# Patient Record
Sex: Male | Born: 1940 | Race: White | Hispanic: No | Marital: Married | Smoking: Never smoker
Health system: Southern US, Community
[De-identification: ages and names within clinical notes are randomized; demographics above are authoritative.]

## PROBLEM LIST (undated history)

## (undated) DIAGNOSIS — C3490 Malignant neoplasm of unspecified part of unspecified bronchus or lung: Secondary | ICD-10-CM

## (undated) HISTORY — PX: KNEE SURGERY: SHX244

## (undated) HISTORY — PX: OTHER SURGICAL HISTORY: SHX169

## (undated) HISTORY — PX: TONSILLECTOMY AND ADENOIDECTOMY: SHX28

---

## 1998-09-26 DIAGNOSIS — R439 Unspecified disturbances of smell and taste: Secondary | ICD-10-CM | POA: Insufficient documentation

## 2000-03-04 DIAGNOSIS — M25579 Pain in unspecified ankle and joints of unspecified foot: Secondary | ICD-10-CM | POA: Insufficient documentation

## 2005-07-05 DIAGNOSIS — K5732 Diverticulitis of large intestine without perforation or abscess without bleeding: Secondary | ICD-10-CM | POA: Insufficient documentation

## 2006-02-04 DIAGNOSIS — F32A Depression, unspecified: Secondary | ICD-10-CM | POA: Insufficient documentation

## 2006-02-28 DIAGNOSIS — G4700 Insomnia, unspecified: Secondary | ICD-10-CM | POA: Insufficient documentation

## 2008-02-27 DIAGNOSIS — E559 Vitamin D deficiency, unspecified: Secondary | ICD-10-CM

## 2008-02-27 HISTORY — DX: Vitamin D deficiency, unspecified: E55.9

## 2010-01-29 DIAGNOSIS — Z9889 Other specified postprocedural states: Secondary | ICD-10-CM | POA: Insufficient documentation

## 2010-01-29 DIAGNOSIS — Z125 Encounter for screening for malignant neoplasm of prostate: Secondary | ICD-10-CM | POA: Insufficient documentation

## 2011-03-05 DIAGNOSIS — D039 Melanoma in situ, unspecified: Secondary | ICD-10-CM

## 2011-03-05 HISTORY — DX: Melanoma in situ, unspecified: D03.9

## 2013-01-03 DIAGNOSIS — F419 Anxiety disorder, unspecified: Secondary | ICD-10-CM | POA: Insufficient documentation

## 2013-11-27 DIAGNOSIS — K2100 Gastro-esophageal reflux disease with esophagitis, without bleeding: Secondary | ICD-10-CM | POA: Insufficient documentation

## 2013-11-27 DIAGNOSIS — D126 Benign neoplasm of colon, unspecified: Secondary | ICD-10-CM | POA: Insufficient documentation

## 2013-11-27 DIAGNOSIS — K449 Diaphragmatic hernia without obstruction or gangrene: Secondary | ICD-10-CM | POA: Insufficient documentation

## 2013-11-27 DIAGNOSIS — K648 Other hemorrhoids: Secondary | ICD-10-CM | POA: Insufficient documentation

## 2013-11-28 DIAGNOSIS — K2270 Barrett's esophagus without dysplasia: Secondary | ICD-10-CM | POA: Insufficient documentation

## 2013-11-28 HISTORY — DX: Barrett's esophagus without dysplasia: K22.70

## 2014-03-28 DIAGNOSIS — N200 Calculus of kidney: Secondary | ICD-10-CM | POA: Insufficient documentation

## 2014-12-30 DIAGNOSIS — K5730 Diverticulosis of large intestine without perforation or abscess without bleeding: Secondary | ICD-10-CM | POA: Insufficient documentation

## 2017-04-18 DIAGNOSIS — R739 Hyperglycemia, unspecified: Secondary | ICD-10-CM | POA: Insufficient documentation

## 2018-01-19 DIAGNOSIS — M5416 Radiculopathy, lumbar region: Secondary | ICD-10-CM | POA: Insufficient documentation

## 2019-08-17 DIAGNOSIS — N182 Chronic kidney disease, stage 2 (mild): Secondary | ICD-10-CM | POA: Insufficient documentation

## 2019-08-21 ENCOUNTER — Other Ambulatory Visit: Payer: Self-pay

## 2019-08-21 ENCOUNTER — Ambulatory Visit (INDEPENDENT_AMBULATORY_CARE_PROVIDER_SITE_OTHER): Payer: Medicare Other | Admitting: Physician Assistant

## 2019-08-21 ENCOUNTER — Encounter: Payer: Self-pay | Admitting: Physician Assistant

## 2019-08-21 DIAGNOSIS — L570 Actinic keratosis: Secondary | ICD-10-CM | POA: Diagnosis not present

## 2019-08-21 DIAGNOSIS — L814 Other melanin hyperpigmentation: Secondary | ICD-10-CM | POA: Diagnosis not present

## 2019-08-21 DIAGNOSIS — D1800 Hemangioma unspecified site: Secondary | ICD-10-CM

## 2019-08-21 DIAGNOSIS — Z86018 Personal history of other benign neoplasm: Secondary | ICD-10-CM

## 2019-08-21 DIAGNOSIS — L821 Other seborrheic keratosis: Secondary | ICD-10-CM | POA: Diagnosis not present

## 2019-08-21 DIAGNOSIS — Z1283 Encounter for screening for malignant neoplasm of skin: Secondary | ICD-10-CM | POA: Diagnosis not present

## 2019-08-21 DIAGNOSIS — L578 Other skin changes due to chronic exposure to nonionizing radiation: Secondary | ICD-10-CM

## 2019-08-21 MED ORDER — TOLAK 4 % EX CREA: 1.0000 "application " | TOPICAL_CREAM | Freq: Every day | CUTANEOUS | 1 refills | Status: DC

## 2019-08-21 NOTE — Progress Notes (Signed)
   Follow-Up Visit   Subjective  Peter Brown is a 79 y.o. male who presents for the following: Follow-up (1 year skin check).   The following portions of the chart were reviewed this encounter and updated as appropriate: Tobacco  Allergies  Meds  Problems  Med Hx  Surg Hx  Fam Hx      Objective  Well appearing patient in no apparent distress; mood and affect are within normal limits.  A full examination was performed including scalp, head, eyes, ears, nose, lips, neck, chest, axillae, abdomen, back, buttocks, bilateral upper extremities, bilateral lower extremities, hands, feet, fingers, toes, fingernails, and toenails. All findings within normal limits unless otherwise noted below.  Objective  Left Buccal Cheek  (4), Right Buccal Cheek  (6), Right Parietal Scalp (5), Right Temporal Scalp: Erythematous patches with gritty scale.  Objective  Left Abdomen (side) - Upper: Scar clear   Assessment & Plan  AK (actinic keratosis) (16) Right Parietal Scalp (5); Left Buccal Cheek  (4); Right Buccal Cheek  (6); Right Temporal Scalp  Destruction of lesion - Left Buccal Cheek , Right Buccal Cheek , Right Parietal Scalp, Right Temporal Scalp Complexity: simple   Destruction method: cryotherapy   Informed consent: discussed and consent obtained   Timeout:  patient name, date of birth, surgical site, and procedure verified Lesion destroyed using liquid nitrogen: Yes   Outcome: patient tolerated procedure well with no complications    Ordered Medications: Fluorouracil (TOLAK) 4 % CREA  Screening for malignant neoplasm of skin Mid Back  Full body skin exam  History of dysplastic nevus Left Abdomen (side) - Upper  observe   Lentigines - Scattered tan macules - Discussed due to sun exposure - Benign, observe - Call for any changes  Seborrheic Keratoses - Stuck-on, waxy, tan-brown papules and plaques  - Discussed benign etiology and prognosis. - Observe - Call for any  changes   Hemangiomas - Red papules - Discussed benign nature - Observe - Call for any changes  Actinic Damage - diffuse scaly erythematous macules with underlying dyspigmentation - Recommend daily broad spectrum sunscreen SPF 30+ to sun-exposed areas, reapply every 2 hours as needed.  - Call for new or changing lesions.  Skin cancer screening performed today.  I, Syleena Mchan, PA-C, have reviewed all documentation's for this visit.  The documentation on 08/21/19 for the exam, diagnosis, procedures and orders are all accurate and complete.

## 2020-07-02 DIAGNOSIS — R918 Other nonspecific abnormal finding of lung field: Secondary | ICD-10-CM | POA: Insufficient documentation

## 2020-07-02 DIAGNOSIS — Z77090 Contact with and (suspected) exposure to asbestos: Secondary | ICD-10-CM | POA: Insufficient documentation

## 2020-07-09 ENCOUNTER — Encounter: Payer: Self-pay | Admitting: *Deleted

## 2020-07-09 DIAGNOSIS — R918 Other nonspecific abnormal finding of lung field: Secondary | ICD-10-CM

## 2020-07-09 NOTE — Progress Notes (Signed)
I contacted [facility] to obtain ct scan from them for coordination of care.  They will make a CD and send to me at the cancer center.

## 2020-07-09 NOTE — Progress Notes (Signed)
I received referral on Mr. Peter Brown.  Due to him living in [place], I updated new patient coordinator to see if patient would like to be seen at [facility]  He would like to be seen here.  I updated her to call and schedule him to be seen with Dr. Julien Nordmann on 07/14/20.

## 2020-07-09 NOTE — Progress Notes (Signed)
I received another call that patient does not have scans from referring facility.  I was updated to call [facility]. I did. They are unable to push scans to pacs.  They need patient release of information before they can send a scan. We have not seen patient so this will have to wait.

## 2020-07-10 ENCOUNTER — Telehealth: Payer: Self-pay | Admitting: Internal Medicine

## 2020-07-10 NOTE — Telephone Encounter (Signed)
Received a new patient referral from Dr. Brynda Greathouse for mass of upper lobe of left lung. Mr. Peter Brown has been cld and scheduled to see Dr. Julien Nordmann on 7/11 at 1:45pm w/labs at 2:15pm. Pt aware to arrive 15 minutes early.

## 2020-07-14 ENCOUNTER — Inpatient Hospital Stay: Payer: Medicare HMO | Attending: Internal Medicine

## 2020-07-14 ENCOUNTER — Inpatient Hospital Stay (HOSPITAL_BASED_OUTPATIENT_CLINIC_OR_DEPARTMENT_OTHER): Payer: Medicare HMO | Admitting: Internal Medicine

## 2020-07-14 ENCOUNTER — Other Ambulatory Visit: Payer: Self-pay

## 2020-07-14 VITALS — BP 128/79 | HR 88 | Temp 97.8°F | Resp 20 | Ht 73.0 in | Wt 179.7 lb

## 2020-07-14 DIAGNOSIS — R918 Other nonspecific abnormal finding of lung field: Secondary | ICD-10-CM | POA: Insufficient documentation

## 2020-07-14 DIAGNOSIS — Z79899 Other long term (current) drug therapy: Secondary | ICD-10-CM | POA: Diagnosis not present

## 2020-07-14 DIAGNOSIS — Z77090 Contact with and (suspected) exposure to asbestos: Secondary | ICD-10-CM | POA: Insufficient documentation

## 2020-07-14 DIAGNOSIS — J90 Pleural effusion, not elsewhere classified: Secondary | ICD-10-CM | POA: Insufficient documentation

## 2020-07-14 LAB — CBC WITH DIFFERENTIAL (CANCER CENTER ONLY)
Abs Immature Granulocytes: 0.02 10*3/uL (ref 0.00–0.07)
Basophils Absolute: 0 10*3/uL (ref 0.0–0.1)
Basophils Relative: 1 %
Eosinophils Absolute: 0.6 10*3/uL — ABNORMAL HIGH (ref 0.0–0.5)
Eosinophils Relative: 7 %
HCT: 39.3 % (ref 39.0–52.0)
Hemoglobin: 13.5 g/dL (ref 13.0–17.0)
Immature Granulocytes: 0 %
Lymphocytes Relative: 19 %
Lymphs Abs: 1.6 10*3/uL (ref 0.7–4.0)
MCH: 31.1 pg (ref 26.0–34.0)
MCHC: 34.4 g/dL (ref 30.0–36.0)
MCV: 90.6 fL (ref 80.0–100.0)
Monocytes Absolute: 0.8 10*3/uL (ref 0.1–1.0)
Monocytes Relative: 10 %
Neutro Abs: 5.5 10*3/uL (ref 1.7–7.7)
Neutrophils Relative %: 63 %
Platelet Count: 198 10*3/uL (ref 150–400)
RBC: 4.34 MIL/uL (ref 4.22–5.81)
RDW: 12.3 % (ref 11.5–15.5)
WBC Count: 8.5 10*3/uL (ref 4.0–10.5)
nRBC: 0 % (ref 0.0–0.2)

## 2020-07-14 LAB — CMP (CANCER CENTER ONLY)
ALT: 12 U/L (ref 0–44)
AST: 17 U/L (ref 15–41)
Albumin: 3.7 g/dL (ref 3.5–5.0)
Alkaline Phosphatase: 88 U/L (ref 38–126)
Anion gap: 8 (ref 5–15)
BUN: 16 mg/dL (ref 8–23)
CO2: 30 mmol/L (ref 22–32)
Calcium: 9.6 mg/dL (ref 8.9–10.3)
Chloride: 104 mmol/L (ref 98–111)
Creatinine: 1.29 mg/dL — ABNORMAL HIGH (ref 0.61–1.24)
GFR, Estimated: 57 mL/min — ABNORMAL LOW (ref >60)
Glucose, Bld: 150 mg/dL — ABNORMAL HIGH (ref 70–99)
Potassium: 4.5 mmol/L (ref 3.5–5.1)
Sodium: 142 mmol/L (ref 135–145)
Total Bilirubin: 0.4 mg/dL (ref 0.3–1.2)
Total Protein: 6.7 g/dL (ref 6.5–8.1)

## 2020-07-14 NOTE — Progress Notes (Signed)
[facility] [phone]   [phone]  CONSULT NOTE  REFERRING PHYSICIAN: Dr. [name]  REASON FOR CONSULTATION:  80 years old white male with suspicious lung nodules  HPI Peter Brown is a 80 y.o. male with past medical history significant for multiple medical problems including history of dyslipidemia, depression, diverticulosis, kidney stone, psoriasis, knee surgery as well as history of asbestos exposure.  The patient had pain on the left side of the chest as well as back.  He initially thought that he pulled a muscle but his pain was getting worse and he was seen by his primary care physician and received a steroid injection twice with some pain medication with no improvement in his symptoms. He had chest x-ray performed for further evaluation of his condition and that showed a suspicious nodule in the left lung.  This was followed by CT scan of the chest on 07/01/2020 and it showed pleural-based left lung nodule measuring 1.3 x 1.5 x 1.9 cm in addition to scattered small bilateral nodules as well as moderate left pleural effusion.  The patient was referred to me today for evaluation and recommendation regarding his condition. When seen today he continues to have pain on the left side of the chest with referral to [facility] and he takes Tylenol on as-needed basis.  He denied having any shortness of breath but has occasional cough with no hemoptysis.  He no significant weight loss or night sweats.  He has no nausea, vomiting, diarrhea or constipation but he takes Prilosec for acid reflux.  He has no headache or visual changes. Family history significant for mother with arthritis and dementia.  Father had prostate cancer and brother had head and neck cancer. The patient is married and he was accompanied today by his wife Peter Brown.  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  He has no history of  smoking, alcohol or drug abuse.  HPI  No past medical history on file.  Past Surgical History:  Procedure Laterality Date   KNEE SURGERY     orthoscopic     TONSILLECTOMY AND ADENOIDECTOMY      No family history on file.  Social History Social History   Tobacco Use   Smoking status: Never   Smokeless tobacco: Never  Substance Use Topics   Alcohol use: Never   Drug use: Never    Allergies  Allergen Reactions   Venlafaxine     Multiple - urin retention, constipation    Current Outpatient Medications  Medication Sig Dispense Refill   aspirin 81 MG EC tablet Take by mouth.     coal tar-salicylic acid 2 % shampoo Apply topically daily as needed for itching.     DULoxetine (CYMBALTA) 30 MG capsule Take 30 mg by mouth daily.     fluocinonide cream (LIDEX) 9.32 % Apply 1 application topically 2 (two) times daily.     Fluorouracil (TOLAK) 4 % CREA Apply 1 application topically at bedtime. qhs x 2 weeks 40 g 1   ketoconazole (NIZORAL) 2 % cream Apply 1 application topically daily.     Krill Oil (OMEGA-3) 500 MG CAPS Take by mouth.     loratadine (CLARITIN) 10 MG tablet Take by mouth.     No current facility-administered medications for this visit.    Review of Systems  Constitutional: positive for fatigue Eyes: negative Ears, nose, mouth, throat, and face: negative Respiratory: positive  for pleurisy/chest pain Cardiovascular: negative Gastrointestinal: negative Genitourinary:negative Integument/breast: negative Hematologic/lymphatic: negative Musculoskeletal:positive for back pain Neurological: negative Behavioral/Psych: negative Endocrine: negative Allergic/Immunologic: negative  Physical Exam  GTX:MIWOE, healthy, no distress, well nourished, and well developed SKIN: skin color, texture, turgor are normal, no rashes or significant lesions HEAD: Normocephalic, No masses, lesions, tenderness or abnormalities EYES: normal, PERRLA, Conjunctiva are pink and  non-injected EARS: External ears normal OROPHARYNX:no exudate, no erythema, and lips, buccal mucosa, and tongue normal  NECK: supple, no adenopathy, no JVD LYMPH:  no palpable lymphadenopathy, no hepatosplenomegaly LUNGS: decreased breath sounds in the lower half of the left lung. HEART: regular rate & rhythm, no murmurs, and no gallops ABDOMEN:abdomen soft, non-tender, normal bowel sounds, and no masses or organomegaly BACK: Back symmetric, no curvature., No CVA tenderness EXTREMITIES:no joint deformities, effusion, or inflammation, no edema  NEURO: alert & oriented x 3 with fluent speech, no focal motor/sensory deficits  PERFORMANCE STATUS: ECOG 1  LABORATORY DATA: Lab Results  Component Value Date   WBC 8.5 07/14/2020   HGB 13.5 07/14/2020   HCT 39.3 07/14/2020   MCV 90.6 07/14/2020   PLT 198 07/14/2020      Chemistry   No results found for: NA, K, CL, CO2, BUN, CREATININE, GLU No results found for: CALCIUM, ALKPHOS, AST, ALT, BILITOT     RADIOGRAPHIC STUDIES: No results found.  ASSESSMENT: This is a very pleasant 80 years old white male with suspicious left pleural-based nodule in addition to scattered bilateral pulmonary nodules and moderate left pleural effusion concerning for pulmonary neoplasm or mesothelioma with his previous history of asbestos exposure.   PLAN: I had a lengthy discussion with the patient and his wife today about his current disease condition and further investigation to confirm his diagnosis. I explained to the patient that this unlikely to be mesothelioma but other primary parenchymal pulmonary neoplasm could not be excluded. I will arrange for the patient to have a PET scan performed for further evaluation of his disease and to choose the best site for tissue diagnosis. I will also refer the patient to interventional radiology for consideration of ultrasound-guided left thoracentesis and will send the fluid for cytologic evaluation. I will see the  patient back for follow-up visit in around 2 weeks for evaluation and more discussion of his treatment options based on the PET scan and the fluid cytology. The patient was advised to call immediately if he has any concerning symptoms in the interval. The patient voices understanding of current disease status and treatment options and is in agreement with the current care plan.  All questions were answered. The patient knows to call the clinic with any problems, questions or concerns. We can certainly see the patient much sooner if necessary.  Thank you so much for allowing me to participate in the care of Peter Brown. I will continue to follow up the patient with you and assist in his care.  The total time spent in the appointment was 60 minutes.  Disclaimer: This note was dictated with voice recognition software. Similar sounding words can inadvertently be transcribed and may not be corrected upon review.   Eilleen Kempf July 14, 2020, 1:37 PM

## 2020-07-16 ENCOUNTER — Encounter: Payer: Self-pay | Admitting: *Deleted

## 2020-07-16 NOTE — Progress Notes (Signed)
I followed up on Mr. Peter Brown schedule. He is set up for his PET and bx will be completed on 7/29.  I completed scheduling message to set up patient for a follow up on 8/1.

## 2020-07-18 ENCOUNTER — Encounter: Payer: Self-pay | Admitting: *Deleted

## 2020-07-18 NOTE — Progress Notes (Signed)
I followed up on Peter Brown plan of care. Per Dr. Worthy Flank last note patient is set up for his thoracentesis and PET scan.

## 2020-07-21 ENCOUNTER — Telehealth: Payer: Self-pay | Admitting: Internal Medicine

## 2020-07-21 ENCOUNTER — Other Ambulatory Visit: Payer: Self-pay

## 2020-07-21 ENCOUNTER — Ambulatory Visit (HOSPITAL_COMMUNITY)
Admission: RE | Admit: 2020-07-21 | Discharge: 2020-07-21 | Disposition: A | Discharge status: Home / Self Care | Payer: Medicare HMO | Source: Ambulatory Visit | Attending: Radiology | Admitting: Radiology

## 2020-07-21 ENCOUNTER — Encounter: Payer: Self-pay | Admitting: *Deleted

## 2020-07-21 ENCOUNTER — Ambulatory Visit (HOSPITAL_COMMUNITY)
Admission: RE | Admit: 2020-07-21 | Discharge: 2020-07-21 | Disposition: A | Discharge status: Home / Self Care | Payer: Medicare HMO | Source: Ambulatory Visit | Attending: Internal Medicine | Admitting: Internal Medicine

## 2020-07-21 DIAGNOSIS — Z77090 Contact with and (suspected) exposure to asbestos: Secondary | ICD-10-CM | POA: Insufficient documentation

## 2020-07-21 DIAGNOSIS — R918 Other nonspecific abnormal finding of lung field: Secondary | ICD-10-CM | POA: Diagnosis not present

## 2020-07-21 DIAGNOSIS — J90 Pleural effusion, not elsewhere classified: Secondary | ICD-10-CM

## 2020-07-21 IMAGING — CR DG CHEST 1V
1 series · 1 of 1 positions shown · non-contrast
Comparison: None.

CLINICAL DATA: Status post left thoracentesis

EXAM:
CHEST  1 VIEW

[w chest pa]
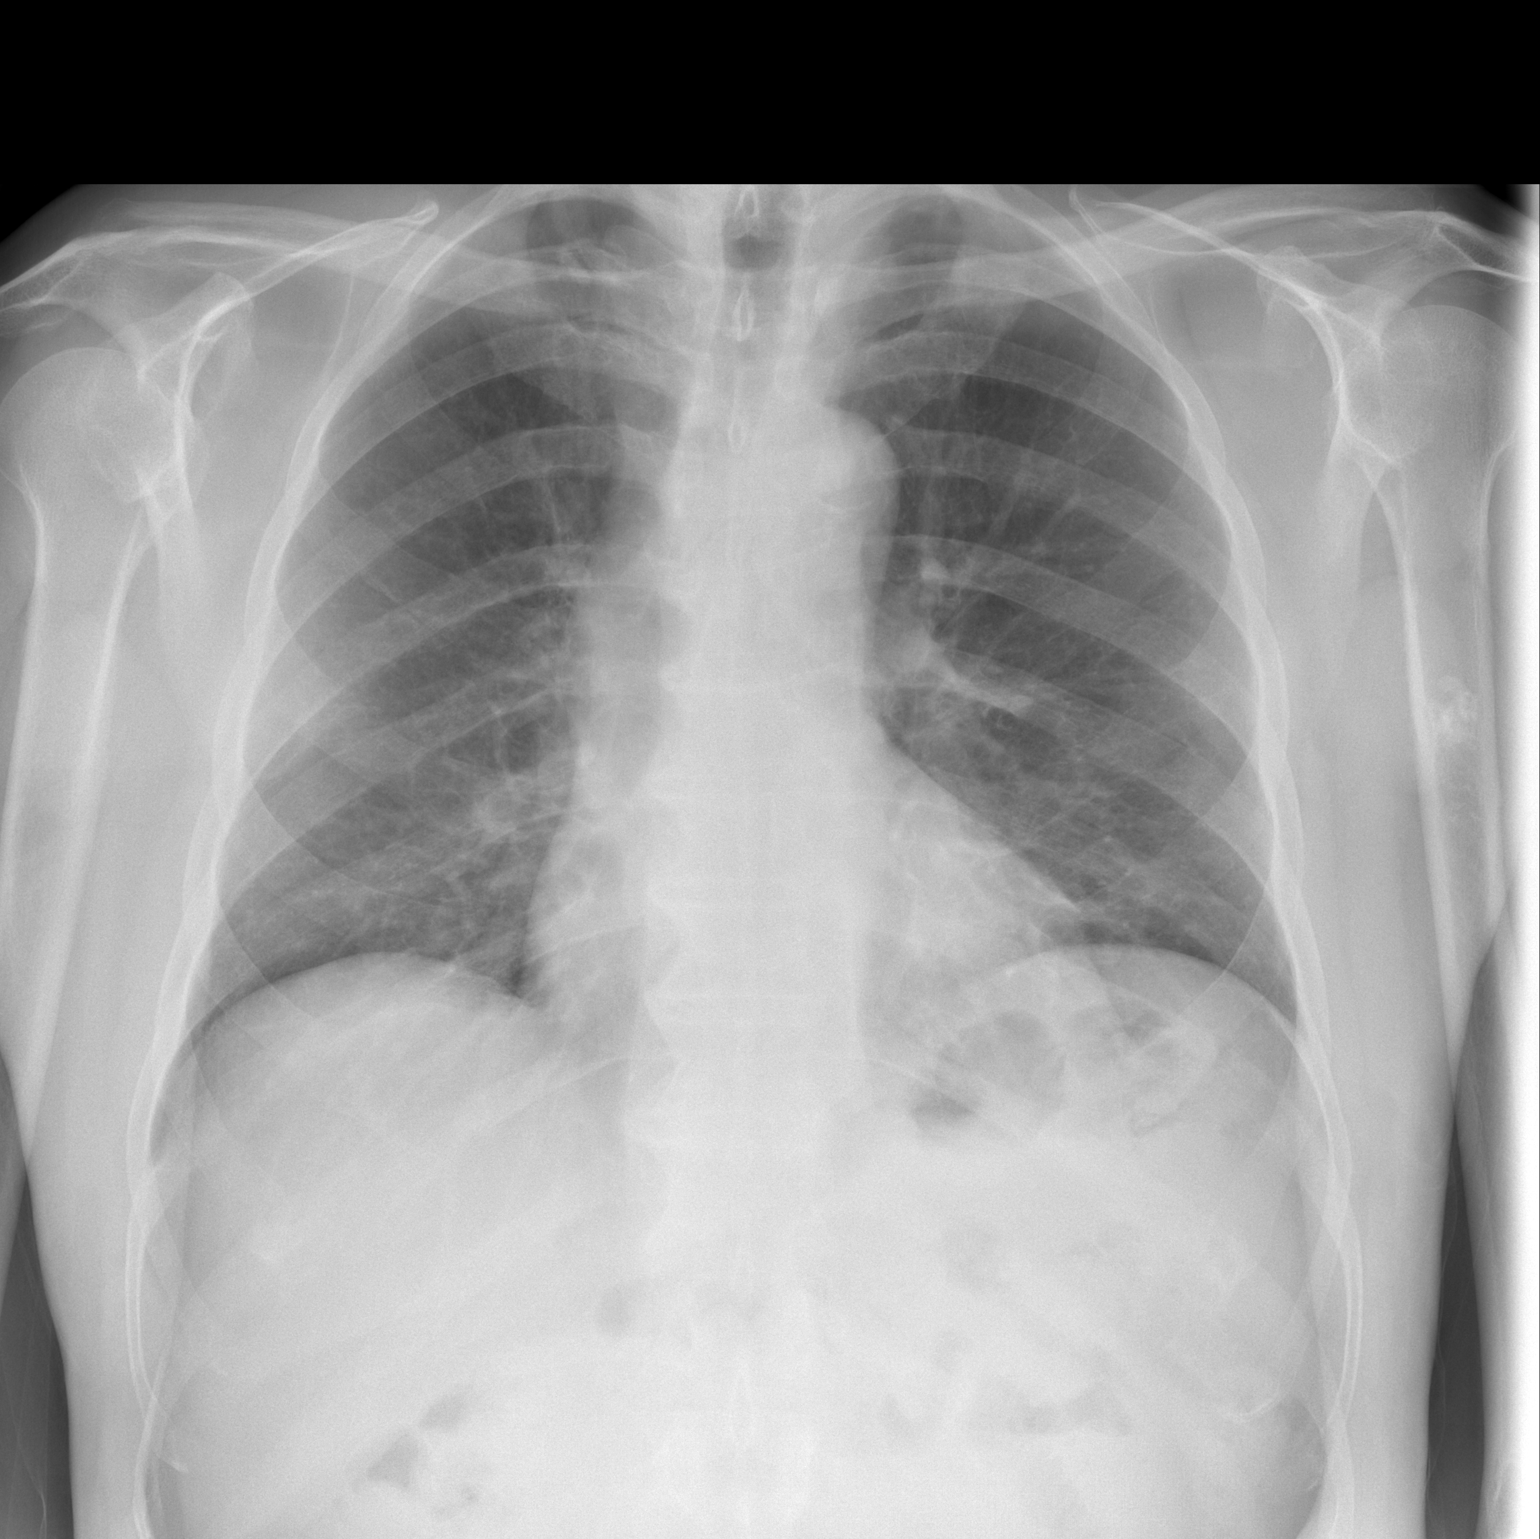

[1 of 1 positions shown; findings below may reference images not displayed]

FINDINGS: Cardiac shadow is within normal limits. The lungs are well aerated
bilaterally. No sizable effusion is seen. No pneumothorax is noted.
No acute rib abnormality is noted. Benign appearing sclerotic focus
is noted in the midshaft of the left humerus.
IMPRESSION: No pneumothorax following thoracentesis.

## 2020-07-21 IMAGING — US US THORACENTESIS ASP PLEURAL SPACE W/IMG GUIDE
1 series · 5 of 5 positions shown · non-contrast
Comparison: none

INDICATION: Left-sided pleural effusion with lung nodules. Request for
diagnostic and therapeutic thoracentesis.

[Series 1: us thoracentesis asp pleural s mc & wl · 5 of 5 slices shown]
[im 1/5]
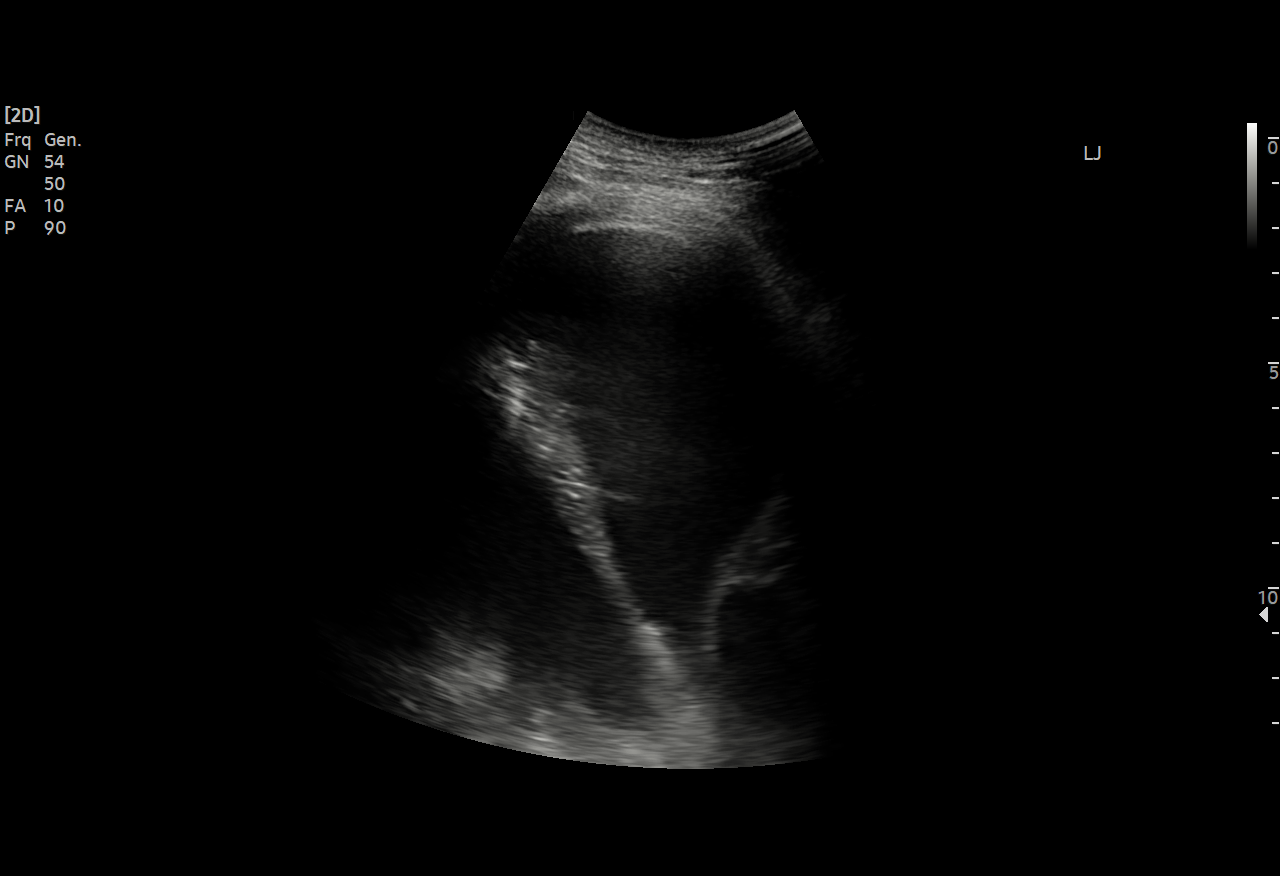
[im 2/5]
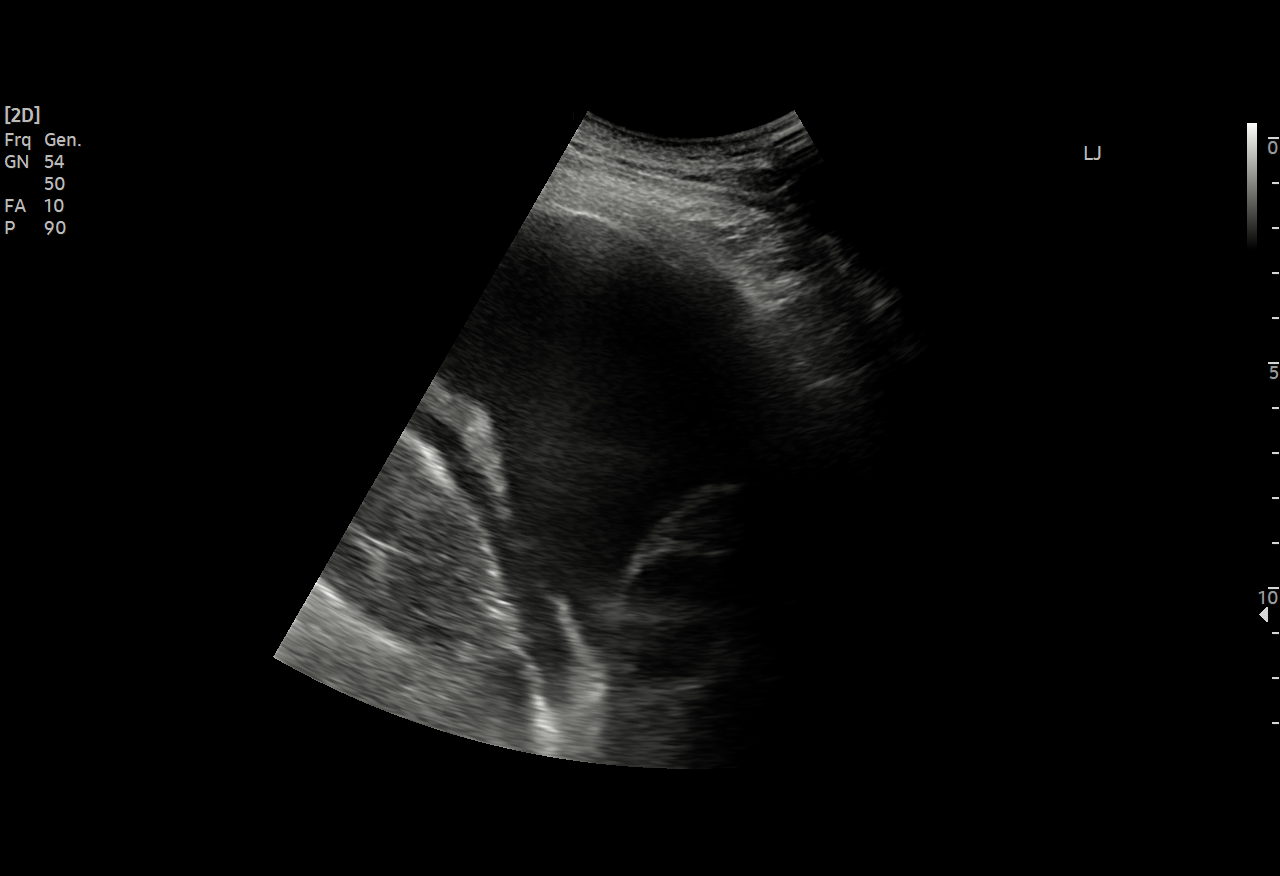
[im 3/5]
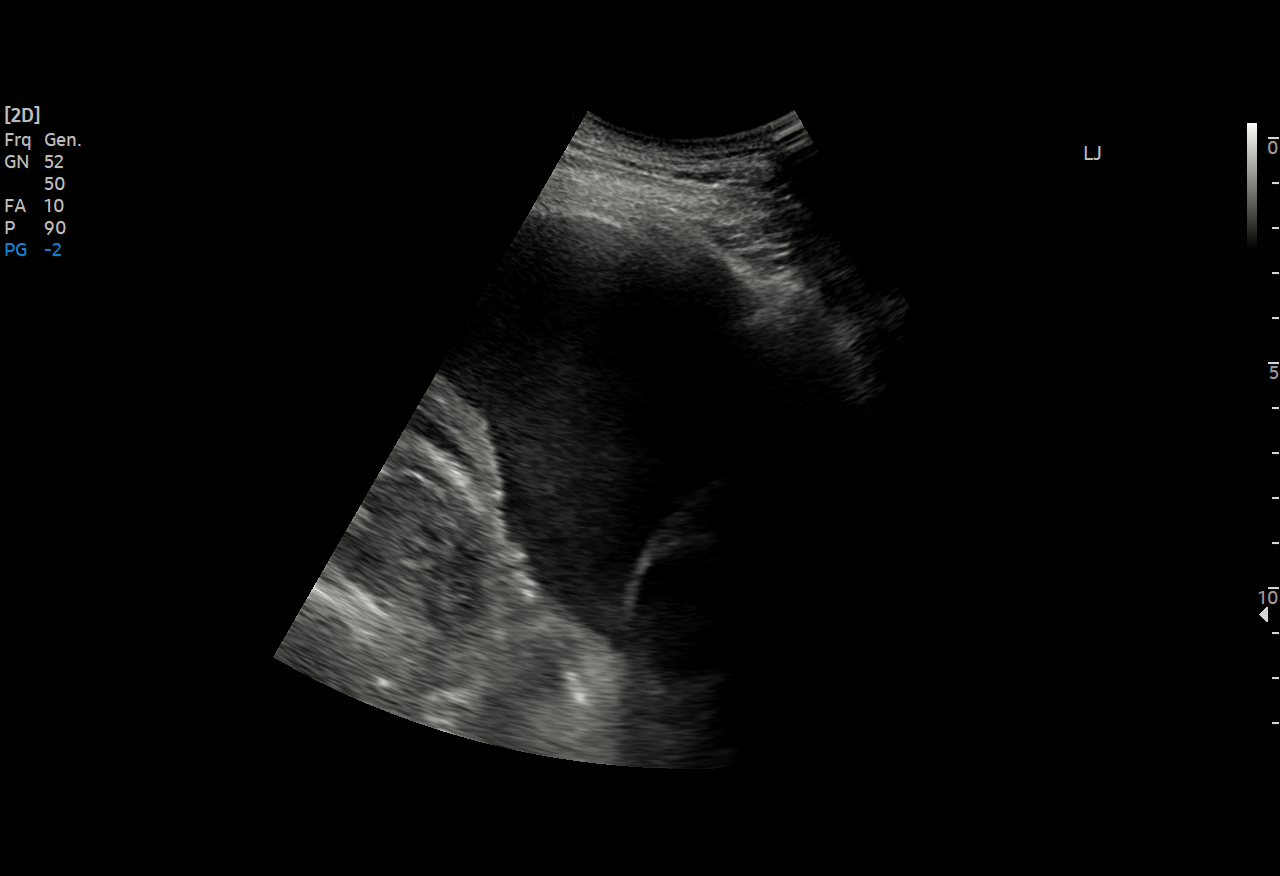
[im 4/5]
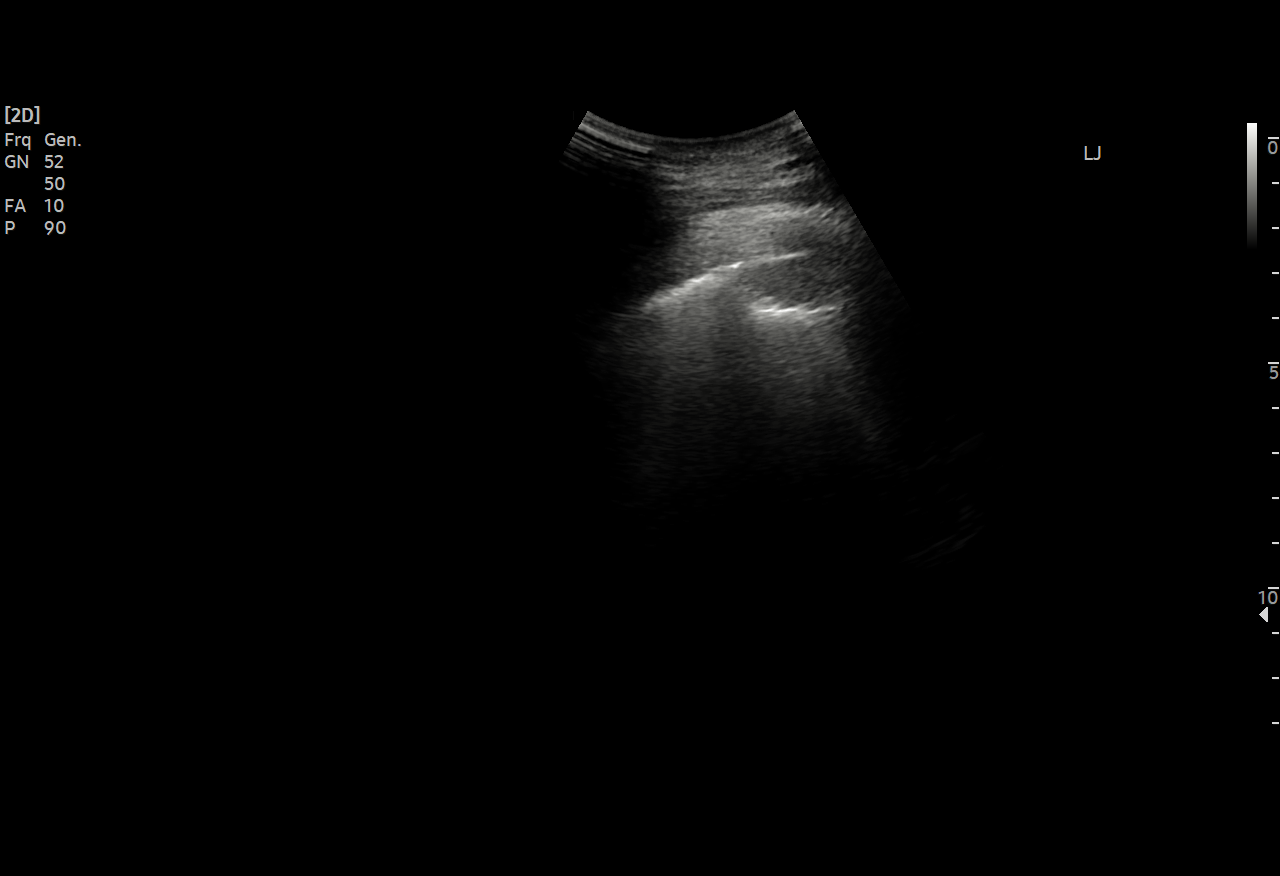
[im 5/5]
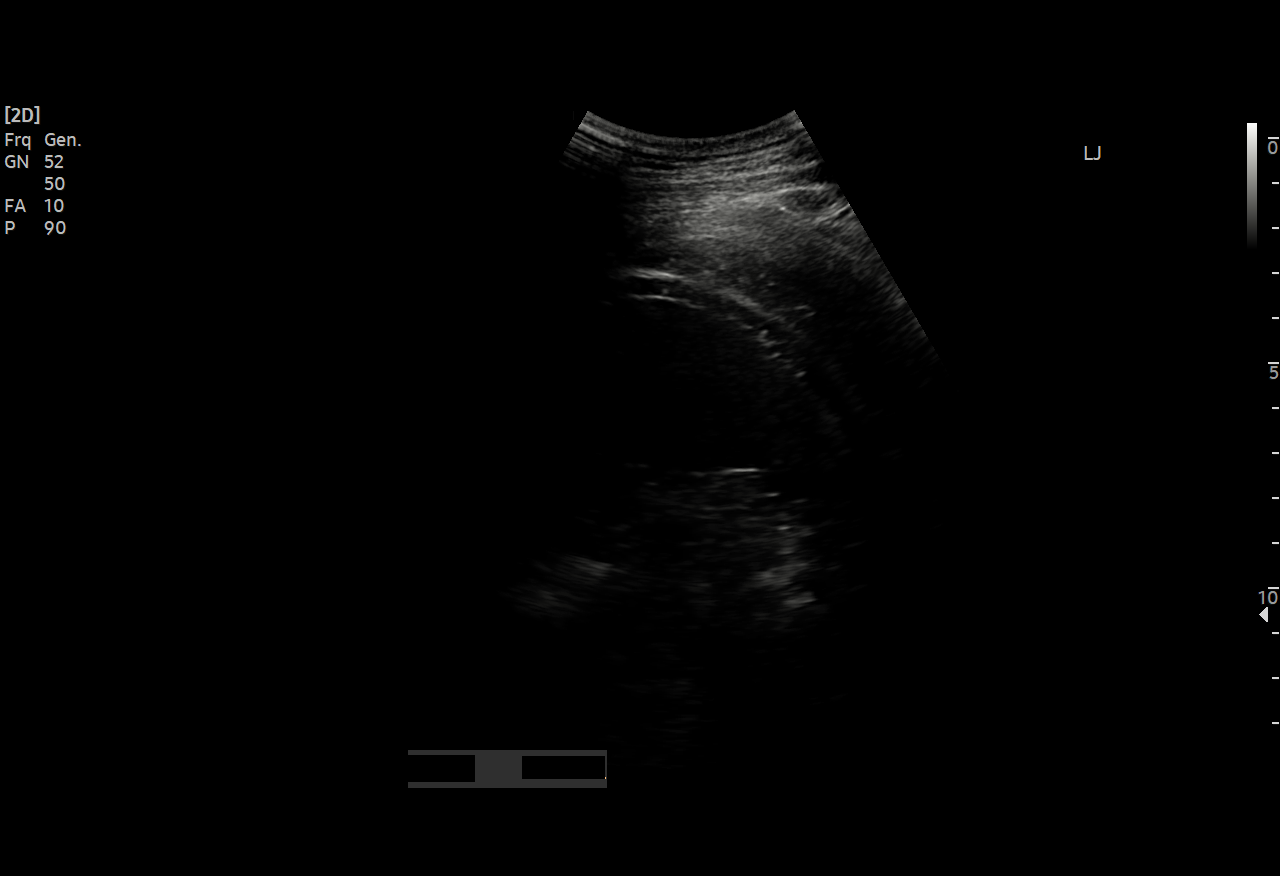

[5 of 5 positions shown; findings below may reference images not displayed]

EXAM:
ULTRASOUND GUIDED LEFT THORACENTESIS

MEDICATIONS:
1% plain lidocaine, 5 mL

COMPLICATIONS:
None immediate.

PROCEDURE:
An ultrasound guided thoracentesis was thoroughly discussed with the
patient and questions answered. The benefits, risks, alternatives
and complications were also discussed. The patient understands and
wishes to proceed with the procedure. Written consent was obtained.

Ultrasound was performed to localize and mark an adequate pocket of
fluid in the left chest. The area was then prepped and draped in the
normal sterile fashion. 1% Lidocaine was used for local anesthesia.
Under ultrasound guidance a 6 Fr Safe-T-Centesis catheter was
introduced. Thoracentesis was performed. The catheter was removed
and a dressing applied.
FINDINGS: A total of approximately 650 mL of hazy amber fluid was removed.
Samples were sent to the laboratory as requested by the clinical
team.
IMPRESSION: Successful ultrasound guided left thoracentesis yielding 650 mL of
pleural fluid.

## 2020-07-21 MED ORDER — LIDOCAINE HCL 1 % IJ SOLN
INTRAMUSCULAR | Status: AC
  Filled 2020-07-21: qty 20

## 2020-07-21 NOTE — Progress Notes (Signed)
Peter Brown is set up for a follow up with Dr. Julien Nordmann after his PET and thoracentesis.  He had his consultation with rad onc today.

## 2020-07-21 NOTE — Telephone Encounter (Signed)
Scheduled appointment per 07/14 sch msg. Patient is aware. 

## 2020-07-21 NOTE — Procedures (Signed)
PROCEDURE SUMMARY:  Successful US guided left thoracentesis. Yielded 650 mL of hazy amber fluid. Pt tolerated procedure well. No immediate complications.  Specimen was sent for labs. CXR ordered.  EBL < 5 mL  Ascencion Dike PA-C 07/21/2020 11:36 AM

## 2020-07-23 ENCOUNTER — Encounter: Payer: Self-pay | Admitting: *Deleted

## 2020-07-23 LAB — CYTOLOGY - NON PAP

## 2020-07-23 NOTE — Progress Notes (Signed)
Per Dr. Julien Nordmann, pathology dept notified to send recent pathology for molecular testing to [facility].

## 2020-07-28 ENCOUNTER — Telehealth: Payer: Self-pay

## 2020-07-28 NOTE — Telephone Encounter (Signed)
Pt LVM sating his PET scan has been denied.  I checking with the PA department and was advised there has been no denial issued and the healthplan decision is still pending at this time.  I have called the pt back and her states he received a letter indicating the request was not processed by them because [facility] withdrew the PA request. I advised of the pt of the current status as of today 07/28/20. Pt expressed understanding of this information.

## 2020-08-01 ENCOUNTER — Other Ambulatory Visit: Payer: Self-pay

## 2020-08-01 ENCOUNTER — Other Ambulatory Visit: Payer: Self-pay | Admitting: Physician Assistant

## 2020-08-01 ENCOUNTER — Ambulatory Visit (HOSPITAL_COMMUNITY)
Admission: RE | Admit: 2020-08-01 | Discharge: 2020-08-01 | Disposition: A | Discharge status: Home / Self Care | Payer: Medicare HMO | Source: Ambulatory Visit | Attending: Internal Medicine | Admitting: Internal Medicine

## 2020-08-01 DIAGNOSIS — C782 Secondary malignant neoplasm of pleura: Secondary | ICD-10-CM | POA: Insufficient documentation

## 2020-08-01 DIAGNOSIS — J90 Pleural effusion, not elsewhere classified: Secondary | ICD-10-CM

## 2020-08-01 DIAGNOSIS — R918 Other nonspecific abnormal finding of lung field: Secondary | ICD-10-CM

## 2020-08-01 DIAGNOSIS — J910 Malignant pleural effusion: Secondary | ICD-10-CM | POA: Insufficient documentation

## 2020-08-01 LAB — GLUCOSE, CAPILLARY: Glucose-Capillary: 96 mg/dL (ref 70–99)

## 2020-08-01 IMAGING — PT NM PET TUM IMG INITIAL (PI) SKULL BASE T - THIGH
7 of 8 series · 23 of 25 positions shown · non-contrast
Comparison: Chest x-ray [DATE]

CLINICAL DATA: Initial treatment strategy for left lung nodule seen
on outside chest CT.

EXAM:
NUCLEAR MEDICINE PET SKULL BASE TO THIGH
TECHNIQUE: 8.93 mCi F-18 FDG was injected intravenously. Full-ring PET imaging
was performed from the skull base to thigh after the radiotracer. CT
data was obtained and used for attenuation correction and anatomic
localization.
Fasting blood glucose: 96 mg/dl

[Series 3: pet sk_thigh ac · axial · 5.0mm · 4.07mm/px · z∈[-1193,-289]mm · 6 of 227 slices shown]
[im 1/227]
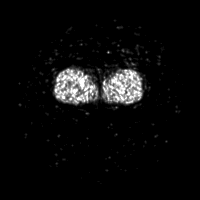
[im 46/227]
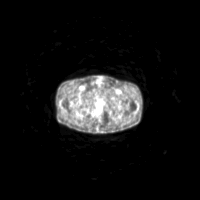
[im 91/227]
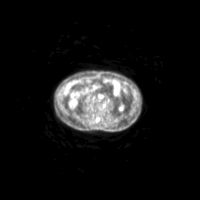
[im 136/227]
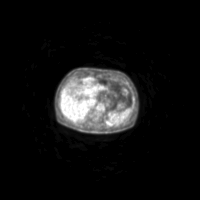
[im 181/227]
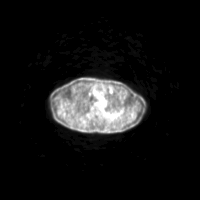
[im 227/227]
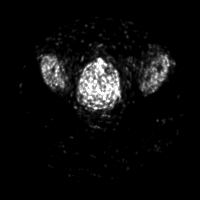

[Series 4: ct sk_thigh 5.0 bf37 · axial · 5.0mm · 0.98mm/px · z∈[-969,-289]mm · 4 of 227 slices shown]
[im 57/227]
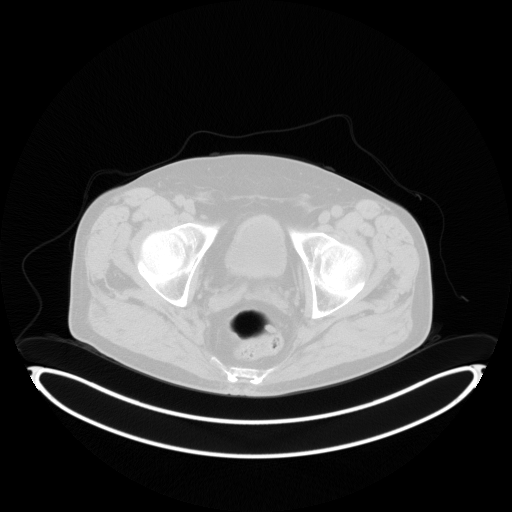
[im 114/227]
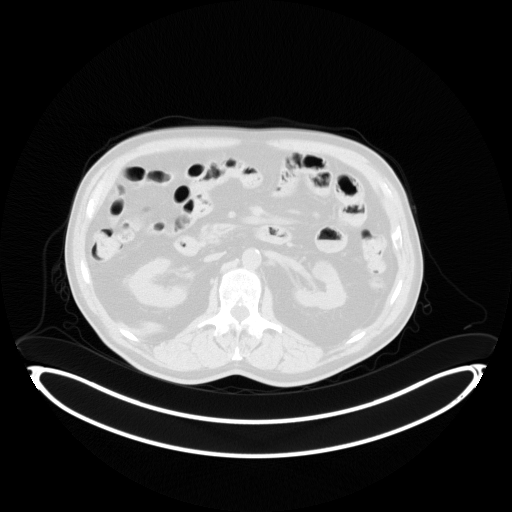
[im 170/227]
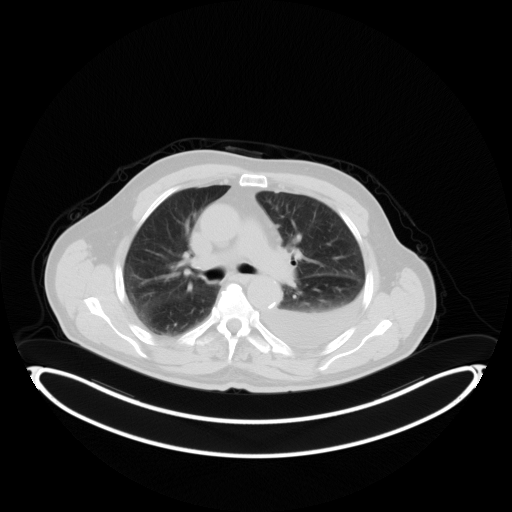
[im 227/227]
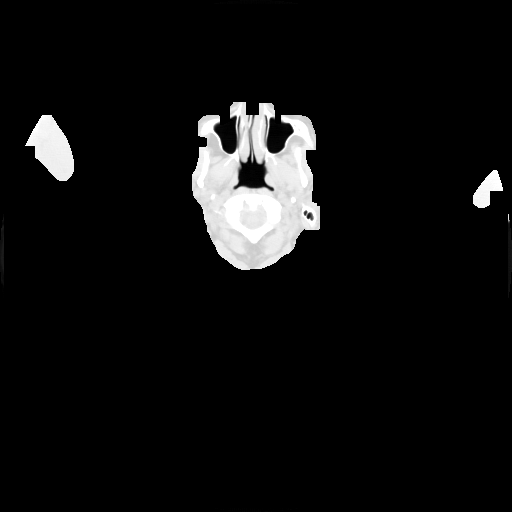

[Series 5: pet sk_thigh nac · axial · 5.0mm · 4.07mm/px · z∈[-1193,-289]mm · 5 of 227 slices shown]
[im 1/227]
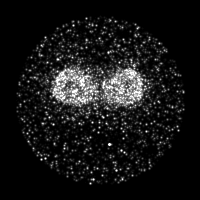
[im 57/227]
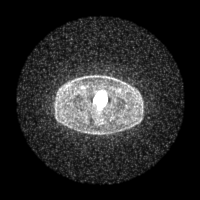
[im 114/227]
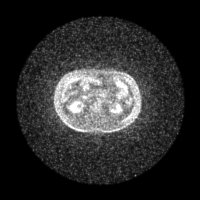
[im 170/227]
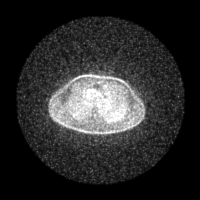
[im 227/227]
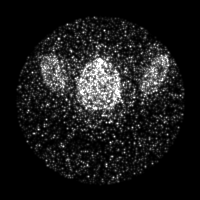

[Series 8: ct sk_thigh 5.0 br59 lung_bone · axial · 5.0mm · 0.66mm/px · 1 of 58 slices shown]
[im 1/58]
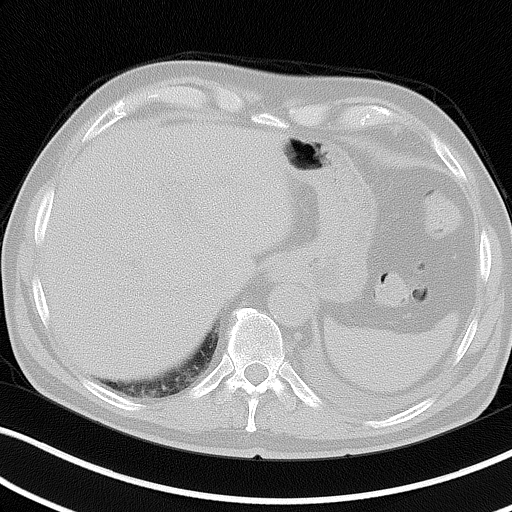

[Series 603: <mip collection> · coronal · 1.88mm/px · 1 of 32 slices shown]
[im 1/32]
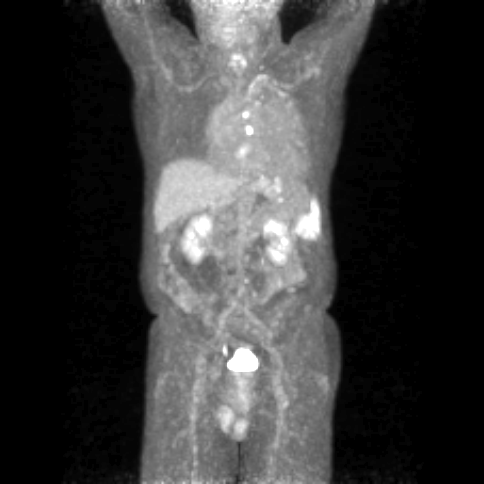

[Series 605: range-ct sk_thigh 5.0 bf37-tra-<alpha range> · 5 of 221 slices shown]
[im 1/221]
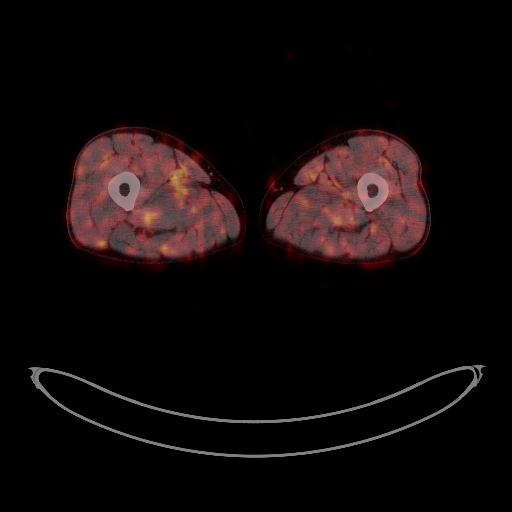
[im 56/221]
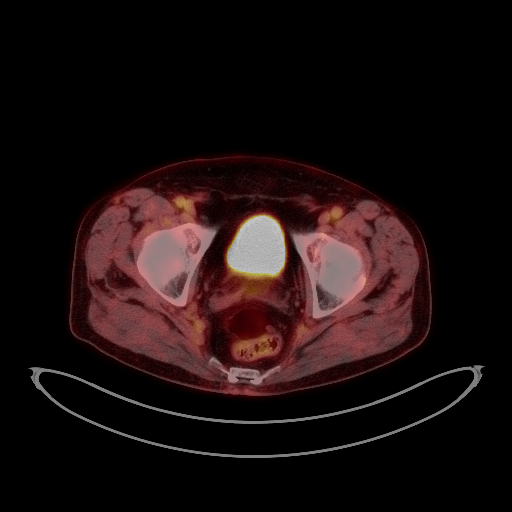
[im 111/221]
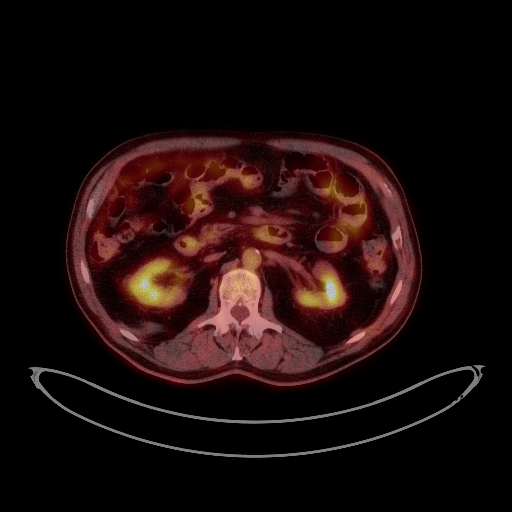
[im 166/221]
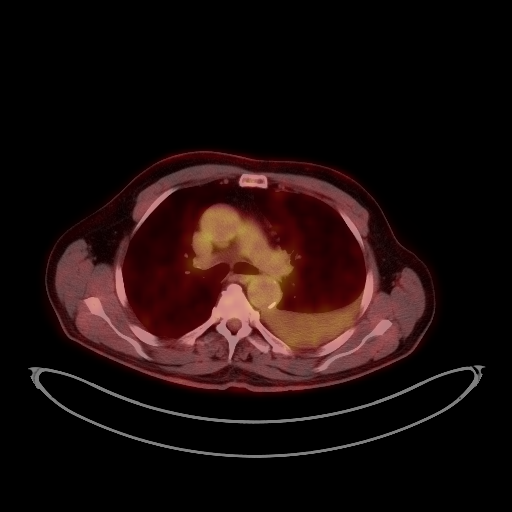
[im 221/221]
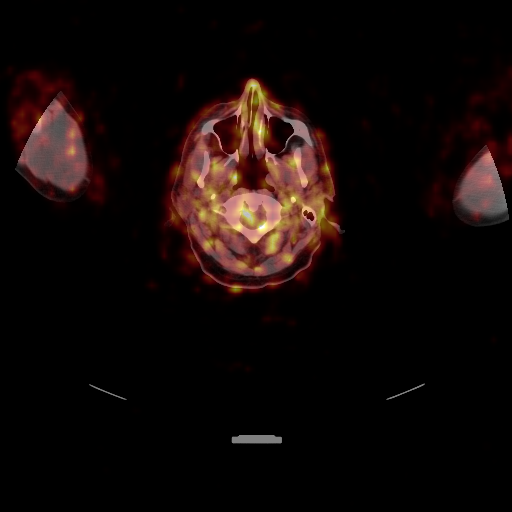

[Series 1080: results mm oncology reading · 1.0mm · 0.50mm/px · 1 of 8 slices shown]
[im 1/8]
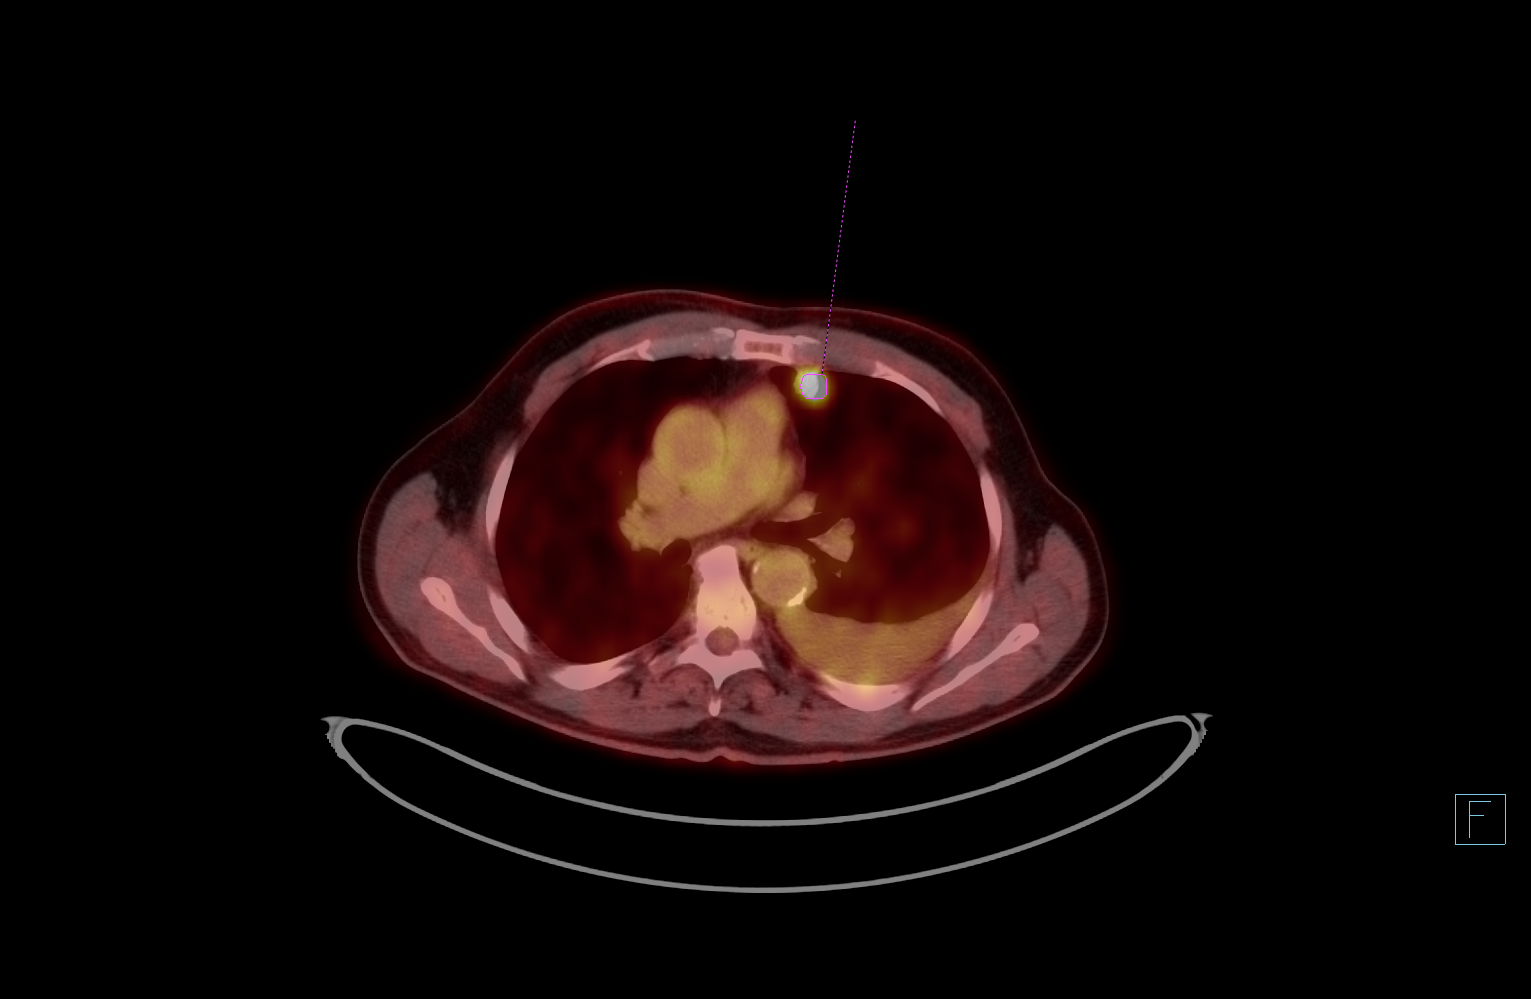

[23 of 25 positions shown; findings below may reference images not displayed]

FINDINGS: Mediastinal blood pool activity: SUV max

Liver activity: SUV max NA

NECK: No hypermetabolic lymph nodes in the neck.

Incidental CT findings: none

CHEST: The 14 mm subpleural left upper lobe pulmonary nodule
anteriorly on image number [DATE] is hypermetabolic with SUV max of
10.10. This likely represents patient's primary lung neoplasm.

10 mm AP window node on image number 54/4 is hypermetabolic with SUV
max of 8.33.

No other hypermetabolic mediastinal or hilar lymph nodes.

There are several small pleural nodules which are hypermetabolic and
consistent with pleural metastases. These are difficult to see on
the noncontrast CT scan. SUV max ranges 4.4-3.7. Associated
malignant left pleural effusion.

No chest wall mass, supraclavicular or axillary adenopathy.

Incidental CT findings: Moderate aortic and coronary artery
calcifications.

No pulmonary nodules to suggest pulmonary metastatic disease.

ABDOMEN/PELVIS: No abnormal hypermetabolic activity within the
liver, pancreas, adrenal glands, or spleen. No hypermetabolic lymph
nodes in the abdomen or pelvis.

Incidental CT findings: Scattered aortic calcifications but no
aneurysm. Sigmoid colon diverticulosis. Mild prostate gland
enlargement.

SKELETON: No findings suspicious for osseous metastatic disease.

Incidental CT findings: none
IMPRESSION: 14 mm left upper lobe pulmonary nodule is hypermetabolic and
consistent primary lung neoplasm.

Metastatic left AP window node.

Left-sided pleural metastatic disease and malignant pleural
effusion.

No abdominal/pelvic or osseous metastatic disease.

## 2020-08-01 MED ORDER — FLUDEOXYGLUCOSE F - 18 (FDG) INJECTION
8.9500 | Freq: Once | INTRAVENOUS | Status: AC
  Administered 2020-08-01: 8.93 via INTRAVENOUS

## 2020-08-04 ENCOUNTER — Inpatient Hospital Stay: Payer: Medicare HMO | Admitting: Internal Medicine

## 2020-08-04 ENCOUNTER — Inpatient Hospital Stay: Payer: Medicare HMO | Attending: Internal Medicine

## 2020-08-04 ENCOUNTER — Encounter: Payer: Self-pay | Admitting: Internal Medicine

## 2020-08-04 ENCOUNTER — Other Ambulatory Visit: Payer: Self-pay

## 2020-08-04 VITALS — BP 132/78 | HR 85 | Temp 97.7°F | Resp 18 | Ht 73.0 in | Wt 179.1 lb

## 2020-08-04 DIAGNOSIS — Z5112 Encounter for antineoplastic immunotherapy: Secondary | ICD-10-CM | POA: Insufficient documentation

## 2020-08-04 DIAGNOSIS — Z5111 Encounter for antineoplastic chemotherapy: Secondary | ICD-10-CM | POA: Insufficient documentation

## 2020-08-04 DIAGNOSIS — J910 Malignant pleural effusion: Secondary | ICD-10-CM | POA: Diagnosis not present

## 2020-08-04 DIAGNOSIS — C771 Secondary and unspecified malignant neoplasm of intrathoracic lymph nodes: Secondary | ICD-10-CM | POA: Diagnosis not present

## 2020-08-04 DIAGNOSIS — Z79899 Other long term (current) drug therapy: Secondary | ICD-10-CM | POA: Insufficient documentation

## 2020-08-04 DIAGNOSIS — R918 Other nonspecific abnormal finding of lung field: Secondary | ICD-10-CM

## 2020-08-04 DIAGNOSIS — C3492 Malignant neoplasm of unspecified part of left bronchus or lung: Secondary | ICD-10-CM | POA: Diagnosis not present

## 2020-08-04 DIAGNOSIS — C7931 Secondary malignant neoplasm of brain: Secondary | ICD-10-CM | POA: Diagnosis not present

## 2020-08-04 DIAGNOSIS — C782 Secondary malignant neoplasm of pleura: Secondary | ICD-10-CM | POA: Diagnosis not present

## 2020-08-04 DIAGNOSIS — J90 Pleural effusion, not elsewhere classified: Secondary | ICD-10-CM

## 2020-08-04 DIAGNOSIS — C3412 Malignant neoplasm of upper lobe, left bronchus or lung: Secondary | ICD-10-CM | POA: Diagnosis not present

## 2020-08-04 DIAGNOSIS — C3490 Malignant neoplasm of unspecified part of unspecified bronchus or lung: Secondary | ICD-10-CM

## 2020-08-04 LAB — CMP (CANCER CENTER ONLY)
ALT: 12 U/L (ref 0–44)
AST: 17 U/L (ref 15–41)
Albumin: 3.9 g/dL (ref 3.5–5.0)
Alkaline Phosphatase: 84 U/L (ref 38–126)
Anion gap: 8 (ref 5–15)
BUN: 16 mg/dL (ref 8–23)
CO2: 27 mmol/L (ref 22–32)
Calcium: 9.5 mg/dL (ref 8.9–10.3)
Chloride: 106 mmol/L (ref 98–111)
Creatinine: 1.22 mg/dL (ref 0.61–1.24)
GFR, Estimated: >60 mL/min (ref >60)
Glucose, Bld: 94 mg/dL (ref 70–99)
Potassium: 4.3 mmol/L (ref 3.5–5.1)
Sodium: 141 mmol/L (ref 135–145)
Total Bilirubin: 0.3 mg/dL (ref 0.3–1.2)
Total Protein: 6.8 g/dL (ref 6.5–8.1)

## 2020-08-04 LAB — CBC WITH DIFFERENTIAL (CANCER CENTER ONLY)
Abs Immature Granulocytes: 0.04 10*3/uL (ref 0.00–0.07)
Basophils Absolute: 0 10*3/uL (ref 0.0–0.1)
Basophils Relative: 1 %
Eosinophils Absolute: 0.5 10*3/uL (ref 0.0–0.5)
Eosinophils Relative: 7 %
HCT: 40.3 % (ref 39.0–52.0)
Hemoglobin: 13.9 g/dL (ref 13.0–17.0)
Immature Granulocytes: 1 %
Lymphocytes Relative: 26 %
Lymphs Abs: 2.2 10*3/uL (ref 0.7–4.0)
MCH: 31 pg (ref 26.0–34.0)
MCHC: 34.5 g/dL (ref 30.0–36.0)
MCV: 89.8 fL (ref 80.0–100.0)
Monocytes Absolute: 1 10*3/uL (ref 0.1–1.0)
Monocytes Relative: 13 %
Neutro Abs: 4.4 10*3/uL (ref 1.7–7.7)
Neutrophils Relative %: 52 %
Platelet Count: 220 10*3/uL (ref 150–400)
RBC: 4.49 MIL/uL (ref 4.22–5.81)
RDW: 12.4 % (ref 11.5–15.5)
WBC Count: 8.2 10*3/uL (ref 4.0–10.5)
nRBC: 0 % (ref 0.0–0.2)

## 2020-08-04 MED ORDER — PROCHLORPERAZINE MALEATE 10 MG PO TABS: 10.0000 mg | ORAL_TABLET | Freq: Four times a day (QID) | ORAL | 0 refills | Status: DC | PRN

## 2020-08-04 MED ORDER — FOLIC ACID 1 MG PO TABS: 1.0000 mg | ORAL_TABLET | Freq: Every day | ORAL | 4 refills | Status: DC

## 2020-08-04 MED ORDER — CYANOCOBALAMIN 1000 MCG/ML IJ SOLN
1000.0000 ug | Freq: Once | INTRAMUSCULAR | Status: AC
  Administered 2020-08-04: 1000 ug via INTRAMUSCULAR

## 2020-08-04 MED ORDER — CYANOCOBALAMIN 1000 MCG/ML IJ SOLN
INTRAMUSCULAR | Status: AC
  Filled 2020-08-04: qty 1

## 2020-08-04 NOTE — Progress Notes (Signed)
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  If there is a biomarker, we will cancel the chemotherapy and start targeted therapy  Patient Characteristics: Stage IV Metastatic, Nonsquamous, Awaiting Molecular Test Results and Need to Start Chemotherapy, PS = 0, 1 Therapeutic Status: Stage IV Metastatic Histology: Nonsquamous Cell Broad Molecular Profiling Status: Awaiting Molecular Test Results and Need to Start Chemotherapy ECOG Performance Status: 1 Intent of Therapy: Non-Curative / Palliative Intent, Discussed with Patient

## 2020-08-04 NOTE — Progress Notes (Signed)
[place] [phone]   [phone]  OFFICE PROGRESS NOTE  [name], MD [address] [place] [address]  DIAGNOSIS: Stage IV (T1b, N2, M1a) non-small cell lung cancer, adenocarcinoma diagnosed in July 2022 and presented with left upper lobe nodule in addition to AP window lymphadenopathy and left-sided malignant pleural effusion as well as pleural metastatic disease. Molecular studies are still pending  PRIOR THERAPY: None  CURRENT THERAPY: Systemic chemotherapy with carboplatin for AUC of 5, Alimta 500 Mg/M2 and Keytruda 200 Mg IV every 3 weeks.  First dose August 13, 2020.  We will start the treatment if the molecular markers are negative for actionable mutation.  Results is expected on August 11, 2020.  INTERVAL HISTORY: Peter Brown 80 y.o. male returns to the clinic today for follow-up visit accompanied by his wife.  The patient is feeling fine today with no concerning complaints except for intermittent pain on the left side of the chest.  He denied having any current shortness of breath except with exertion with mild cough and no hemoptysis.  He denied having any fever or chills.  He has no nausea, vomiting, diarrhea or constipation.  He denied having any headache or visual changes.  He has no weight loss or night sweats.  He had several studies performed recently including a PET scan as well as ultrasound-guided left thoracentesis and the final cytology was consistent with non-small cell carcinoma, adenocarcinoma.  The tissue block was sent to [facility] for molecular studies and PD-L1 expression.  The results are still pending and expected to be available on August 11, 2020.  The patient is here today for evaluation and recommendation regarding treatment of his condition.  MEDICAL HISTORY:No past medical history on file.  ALLERGIES:  is allergic to venlafaxine.  MEDICATIONS:  Current Outpatient Medications  Medication Sig Dispense  Refill   aspirin 81 MG EC tablet Take by mouth.     Bacillus Coagulans-Inulin (PROBIOTIC) 1-250 BILLION-MG CAPS Take 1 tablet by mouth daily.     coal tar-salicylic acid 2 % shampoo Apply topically daily as needed for itching.     DULoxetine (CYMBALTA) 30 MG capsule Take 30 mg by mouth daily.     fluocinonide cream (LIDEX) 0.96 % Apply 1 application topically 2 (two) times daily.     ketoconazole (NIZORAL) 2 % cream Apply 1 application topically daily. (Patient not taking: Reported on 07/14/2020)     Krill Oil (OMEGA-3) 500 MG CAPS Take by mouth.     loratadine (CLARITIN) 10 MG tablet Take by mouth.     Magnesium 250 MG TABS Take by mouth.     omeprazole (PRILOSEC) 40 MG capsule Take 40 mg by mouth daily.     No current facility-administered medications for this visit.    SURGICAL HISTORY:  Past Surgical History:  Procedure Laterality Date   KNEE SURGERY     orthoscopic     TONSILLECTOMY AND ADENOIDECTOMY      REVIEW OF SYSTEMS:  Constitutional: positive for fatigue Eyes: negative Ears, nose, mouth, throat, and face: negative Respiratory: positive for dyspnea on exertion and pleurisy/chest pain Cardiovascular: negative Gastrointestinal: negative Genitourinary:negative Integument/breast: negative Hematologic/lymphatic: negative Musculoskeletal:negative Neurological: negative Behavioral/Psych: negative Endocrine: negative Allergic/Immunologic: negative   PHYSICAL EXAMINATION: General appearance: alert, cooperative, fatigued, and no distress Head: Normocephalic, without obvious abnormality, atraumatic Neck: no adenopathy, no JVD, supple, symmetrical, trachea midline, and thyroid not enlarged, symmetric, no tenderness/mass/nodules Lymph nodes: Cervical, supraclavicular, and axillary nodes normal.  Resp: diminished breath sounds LLL and dullness to percussion LLL Back: symmetric, no curvature. ROM normal. No CVA tenderness. Cardio: regular rate and rhythm, S1, S2 normal, no  murmur, click, rub or gallop GI: soft, non-tender; bowel sounds normal; no masses,  no organomegaly Extremities: extremities normal, atraumatic, no cyanosis or edema Neurologic: Alert and oriented X 3, normal strength and tone. Normal symmetric reflexes. Normal coordination and gait  ECOG PERFORMANCE STATUS: 1 - Symptomatic but completely ambulatory  Blood pressure 132/78, pulse 85, temperature 97.7 F (36.5 C), temperature source Tympanic, resp. rate 18, height '6\' 1"'  (1.854 m), weight 179 lb 1.6 oz (81.2 kg), SpO2 98 %.  LABORATORY DATA: Lab Results  Component Value Date   WBC 8.2 08/04/2020   HGB 13.9 08/04/2020   HCT 40.3 08/04/2020   MCV 89.8 08/04/2020   PLT 220 08/04/2020      Chemistry      Component Value Date/Time   NA 142 07/14/2020 1330   K 4.5 07/14/2020 1330   CL 104 07/14/2020 1330   CO2 30 07/14/2020 1330   BUN 16 07/14/2020 1330   CREATININE 1.29 (H) 07/14/2020 1330      Component Value Date/Time   CALCIUM 9.6 07/14/2020 1330   ALKPHOS 88 07/14/2020 1330   AST 17 07/14/2020 1330   ALT 12 07/14/2020 1330   BILITOT 0.4 07/14/2020 1330       RADIOGRAPHIC STUDIES: DG Chest 1 View  Result Date: 07/21/2020 CLINICAL DATA:  Status post left thoracentesis EXAM: CHEST  1 VIEW COMPARISON:  None. FINDINGS: Cardiac shadow is within normal limits. The lungs are well aerated bilaterally. No sizable effusion is seen. No pneumothorax is noted. No acute rib abnormality is noted. Benign appearing sclerotic focus is noted in the midshaft of the left humerus. IMPRESSION: No pneumothorax following thoracentesis. Electronically Signed   By: Inez Catalina M.D.   On: 07/21/2020 11:57   NM PET Image Initial (PI) Skull Base To Thigh  Result Date: 08/03/2020 CLINICAL DATA:  Initial treatment strategy for left lung nodule seen on outside chest CT. EXAM: NUCLEAR MEDICINE PET SKULL BASE TO THIGH TECHNIQUE: 8.93 mCi F-18 FDG was injected intravenously. Full-ring PET imaging was  performed from the skull base to thigh after the radiotracer. CT data was obtained and used for attenuation correction and anatomic localization. Fasting blood glucose: 96 mg/dl COMPARISON:  Chest x-ray 07/11/2020 FINDINGS: Mediastinal blood pool activity: SUV max 2.39 Liver activity: SUV max NA NECK: No hypermetabolic lymph nodes in the neck. Incidental CT findings: none CHEST: The 14 mm subpleural left upper lobe pulmonary nodule anteriorly on image number 25/0 is hypermetabolic with SUV max of 03.70. 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 10 mm AP window node on image number 48/8 is hypermetabolic with SUV max of 8.91. No other hypermetabolic mediastinal or hilar lymph nodes. There are several small pleural nodules which are hypermetabolic and consistent with pleural metastases. These are difficult to see on the noncontrast CT scan. SUV max ranges 4.4-3.7. Associated malignant left pleural effusion. No chest wall mass, supraclavicular or axillary adenopathy. Incidental CT findings: Moderate aortic and coronary artery calcifications. No pulmonary nodules to suggest pulmonary metastatic disease. ABDOMEN/PELVIS: No abnormal hypermetabolic activity within the liver, pancreas, adrenal glands, or spleen. No hypermetabolic lymph nodes in the abdomen or pelvis. Incidental CT findings: Scattered aortic calcifications but no aneurysm. Sigmoid colon diverticulosis. Mild prostate gland enlargement. SKELETON: No findings suspicious for osseous metastatic disease. Incidental CT findings: none IMPRESSION: 14 mm left upper  lobe pulmonary nodule is hypermetabolic and consistent primary lung neoplasm. Metastatic left AP window node. 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 No abdominal/pelvic or osseous metastatic disease. Electronically Signed   By: Marijo Sanes M.D.   On: 08/03/2020 15:20   US Thoracentesis Asp Pleural space w/IMG guide  Result Date: 07/21/2020 INDICATION:  Left-sided pleural effusion with lung nodules. Request for diagnostic and therapeutic thoracentesis. EXAM: ULTRASOUND GUIDED LEFT THORACENTESIS MEDICATIONS: 1% plain lidocaine, 5 mL COMPLICATIONS: None immediate. PROCEDURE: An ultrasound guided thoracentesis was thoroughly discussed with the patient and questions answered. The benefits, risks, alternatives and complications were also discussed. The patient understands and wishes to proceed with the procedure. Written consent was obtained. Ultrasound was performed to localize and mark an adequate pocket of fluid in the left chest. The area was then prepped and draped in the normal sterile fashion. 1% Lidocaine was used for local anesthesia. Under ultrasound guidance a 6 Fr Safe-T-Centesis catheter was introduced. Thoracentesis was performed. The catheter was removed and a dressing applied. FINDINGS: A total of approximately 650 mL of hazy amber fluid was removed. Samples were sent to the laboratory as requested by the clinical team. IMPRESSION: Successful ultrasound guided left thoracentesis yielding 650 mL of pleural fluid. Read by: Ascencion Dike PA-C Electronically Signed   By: Aletta Edouard M.D.   On: 07/21/2020 12:06    ASSESSMENT AND PLAN: This is a very pleasant 80 years old white male recently diagnosed with a stage IV (T1b, N2, M1 a) non-small cell lung cancer, adenocarcinoma presented with left upper lobe lung nodule in addition to mediastinal lymphadenopathy and pleural-based metastasis as well as malignant left pleural effusion diagnosed in July 2022. Molecular studies are still pending. I personally and independently reviewed the PET scan imaging and discussed the result and showed the images to the patient and his wife. I explained to the patient that he has incurable condition and all the treatment will be of palliative nature. I gave the patient the option of palliative care versus palliative treatment with either systemic chemotherapy with  carboplatin for AUC of 5, Alimta 500 Mg/M2 and Keytruda 200 Mg IV every 3 weeks if he has no actionable mutations on the pending molecular studies. If the molecular studies showed an actionable mutation, the patient will be treated with targeted therapy and we will cancel the systemic chemotherapy. I discussed with the patient the adverse effect of the chemotherapy including but not limited to alopecia, myelosuppression, nausea and vomiting, peripheral neuropathy, liver or renal dysfunction as well as immunotherapy adverse effects. Will arrange for the patient to receive vitamin B12 injection today. I will call his pharmacy with prescription for folic acid 1 mg p.o. daily in addition to Compazine 10 mg p.o. every 6 hours as needed for nausea. I will also arrange for the patient to have a chemotherapy education class before the first dose of his treatment. He is expected to start the first cycle of his treatment on August 13, 2020 if the molecular studies are negative. The patient will come back for follow-up visit 1 week after his treatment for evaluation and management of any adverse effect of his treatment. For the recurrent left pleural effusion, will continue to monitor closely and consider The patient for repeat ultrasound-guided left thoracentesis or placement of Pleurx catheter if needed. To complete the staging work-up I will order MRI of the brain to rule out brain metastasis. The patient was advised to call immediately if he has any other concerning symptoms  in the interval. The patient voices understanding of current disease status and treatment options and is in agreement with the current care plan.  All questions were answered. The patient knows to call the clinic with any problems, questions or concerns. We can certainly see the patient much sooner if necessary.  The total time spent in the appointment was 55 minutes.  Disclaimer: This note was dictated with voice recognition software.  Similar sounding words can inadvertently be transcribed and may not be corrected upon review.

## 2020-08-06 ENCOUNTER — Encounter (HOSPITAL_COMMUNITY): Payer: Self-pay | Admitting: Internal Medicine

## 2020-08-09 ENCOUNTER — Other Ambulatory Visit: Payer: Self-pay | Admitting: Internal Medicine

## 2020-08-11 ENCOUNTER — Inpatient Hospital Stay: Payer: Medicare HMO

## 2020-08-11 ENCOUNTER — Encounter: Payer: Self-pay | Admitting: Internal Medicine

## 2020-08-11 ENCOUNTER — Other Ambulatory Visit: Payer: Self-pay

## 2020-08-11 DIAGNOSIS — C3492 Malignant neoplasm of unspecified part of left bronchus or lung: Secondary | ICD-10-CM

## 2020-08-11 DIAGNOSIS — Z5112 Encounter for antineoplastic immunotherapy: Secondary | ICD-10-CM | POA: Diagnosis not present

## 2020-08-11 LAB — CBC WITH DIFFERENTIAL (CANCER CENTER ONLY)
Abs Immature Granulocytes: 0.04 10*3/uL (ref 0.00–0.07)
Basophils Absolute: 0.1 10*3/uL (ref 0.0–0.1)
Basophils Relative: 1 %
Eosinophils Absolute: 0.6 10*3/uL — ABNORMAL HIGH (ref 0.0–0.5)
Eosinophils Relative: 6 %
HCT: 44 % (ref 39.0–52.0)
Hemoglobin: 15 g/dL (ref 13.0–17.0)
Immature Granulocytes: 1 %
Lymphocytes Relative: 22 %
Lymphs Abs: 1.9 10*3/uL (ref 0.7–4.0)
MCH: 31 pg (ref 26.0–34.0)
MCHC: 34.1 g/dL (ref 30.0–36.0)
MCV: 90.9 fL (ref 80.0–100.0)
Monocytes Absolute: 1 10*3/uL (ref 0.1–1.0)
Monocytes Relative: 11 %
Neutro Abs: 5 10*3/uL (ref 1.7–7.7)
Neutrophils Relative %: 59 %
Platelet Count: 237 10*3/uL (ref 150–400)
RBC: 4.84 MIL/uL (ref 4.22–5.81)
RDW: 12.3 % (ref 11.5–15.5)
WBC Count: 8.6 10*3/uL (ref 4.0–10.5)
nRBC: 0 % (ref 0.0–0.2)

## 2020-08-11 LAB — CMP (CANCER CENTER ONLY)
ALT: 14 U/L (ref 0–44)
AST: 18 U/L (ref 15–41)
Albumin: 4.3 g/dL (ref 3.5–5.0)
Alkaline Phosphatase: 90 U/L (ref 38–126)
Anion gap: 10 (ref 5–15)
BUN: 16 mg/dL (ref 8–23)
CO2: 28 mmol/L (ref 22–32)
Calcium: 10.4 mg/dL — ABNORMAL HIGH (ref 8.9–10.3)
Chloride: 103 mmol/L (ref 98–111)
Creatinine: 1.14 mg/dL (ref 0.61–1.24)
GFR, Estimated: >60 mL/min (ref >60)
Glucose, Bld: 98 mg/dL (ref 70–99)
Potassium: 4.8 mmol/L (ref 3.5–5.1)
Sodium: 141 mmol/L (ref 135–145)
Total Bilirubin: 0.5 mg/dL (ref 0.3–1.2)
Total Protein: 7.6 g/dL (ref 6.5–8.1)

## 2020-08-11 LAB — TSH: TSH: 3.422 u[IU]/mL (ref 0.320–4.118)

## 2020-08-13 ENCOUNTER — Ambulatory Visit (HOSPITAL_COMMUNITY)
Admission: RE | Admit: 2020-08-13 | Discharge: 2020-08-13 | Disposition: A | Discharge status: Home / Self Care | Payer: Medicare HMO | Source: Ambulatory Visit | Attending: Internal Medicine | Admitting: Internal Medicine

## 2020-08-13 ENCOUNTER — Inpatient Hospital Stay: Payer: Medicare HMO

## 2020-08-13 ENCOUNTER — Other Ambulatory Visit: Payer: Self-pay | Admitting: Internal Medicine

## 2020-08-13 ENCOUNTER — Other Ambulatory Visit: Payer: Medicare HMO

## 2020-08-13 ENCOUNTER — Encounter: Payer: Self-pay | Admitting: Internal Medicine

## 2020-08-13 ENCOUNTER — Other Ambulatory Visit: Payer: Self-pay

## 2020-08-13 VITALS — BP 128/71 | HR 91 | Temp 98.8°F | Resp 20 | Wt 180.8 lb

## 2020-08-13 DIAGNOSIS — Z5112 Encounter for antineoplastic immunotherapy: Secondary | ICD-10-CM | POA: Diagnosis not present

## 2020-08-13 DIAGNOSIS — C3490 Malignant neoplasm of unspecified part of unspecified bronchus or lung: Secondary | ICD-10-CM

## 2020-08-13 DIAGNOSIS — C3492 Malignant neoplasm of unspecified part of left bronchus or lung: Secondary | ICD-10-CM

## 2020-08-13 IMAGING — MR MR HEAD WO/W CM
13 series · 48 of 48 positions shown · IV contrast (gadavist)
Comparison: None.

CLINICAL DATA: Non-small cell lung cancer staging

EXAM:
MRI HEAD WITHOUT AND WITH CONTRAST
TECHNIQUE: Multiplanar, multiecho pulse sequences of the brain and surrounding
structures were obtained without and with intravenous contrast.
CONTRAST:  8mL GADAVIST GADOBUTROL 1 MMOL/ML IV SOLN

[Series 5: DWI · axial · 3.0mm · 1.36mm/px · z∈[+19,+174]mm · 6 of 108 slices shown (1 of 2)]
[im 1/108]
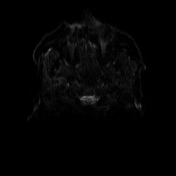
[im 22/108]
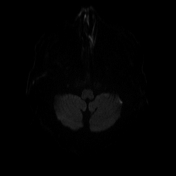
[im 43/108]
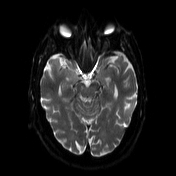
[im 65/108]
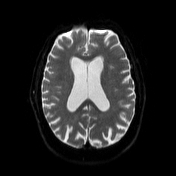
[im 86/108]
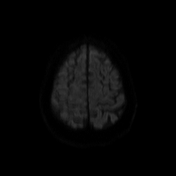
[im 108/108]
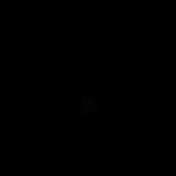

[Series 6: DWI · axial · 3.0mm · 1.36mm/px · z∈[+19,+174]mm · 3 of 54 slices shown (2 of 2)]
[im 1/54]
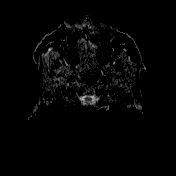
[im 27/54]
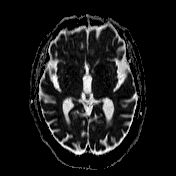
[im 54/54]
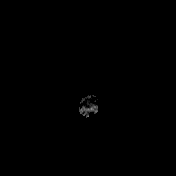

[Series 7: T1 · sagittal · 5.0mm · 0.75mm/px · 2 of 26 slices shown (1 of 2)]
[im 1/26]
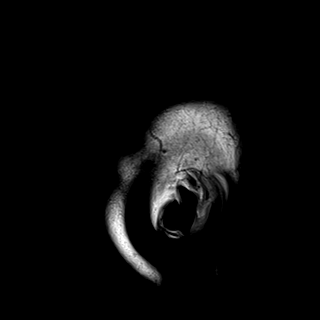
[im 26/26]
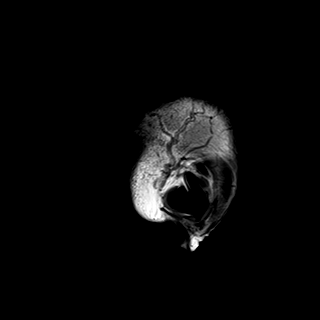

[Series 8: T2 · axial · 5.0mm · 0.62mm/px · 1 of 25 slices shown]
[im 1/25]
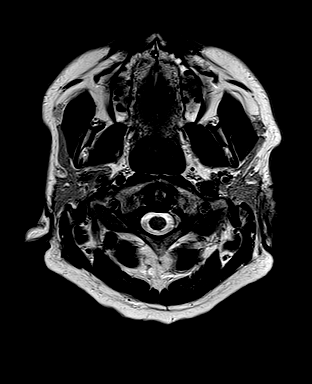

[Series 9: swi_images · axial · 3.0mm · 0.75mm/px · z∈[+19,+168]mm · 3 of 52 slices shown]
[im 1/52]
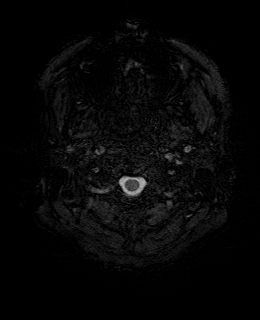
[im 26/52]
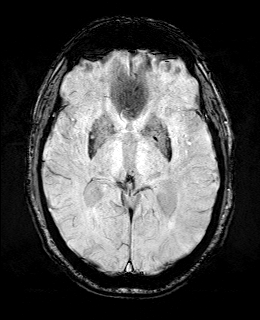
[im 52/52]
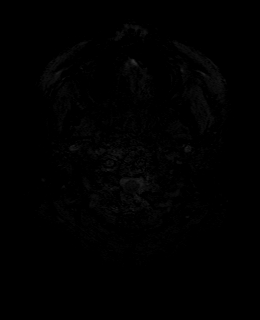

[Series 11: FLAIR · axial · 3.0mm · 0.75mm/px · z∈[+19,+168]mm · 3 of 52 slices shown]
[im 1/52]
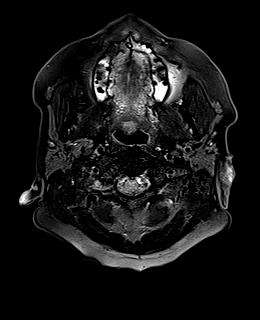
[im 26/52]
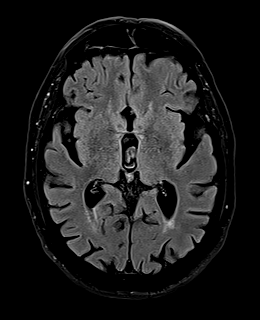
[im 52/52]
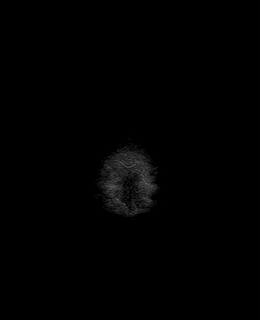

[Series 12: T1 · axial · 1.0mm · 0.94mm/px · z∈[+14,+169]mm · 9 of 160 slices shown (2 of 2)]
[im 1/160]
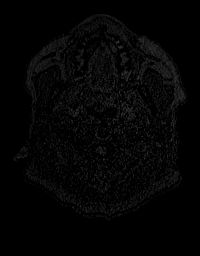
[im 20/160]
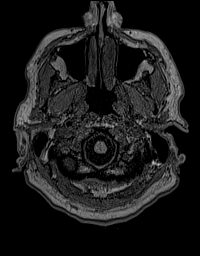
[im 40/160]
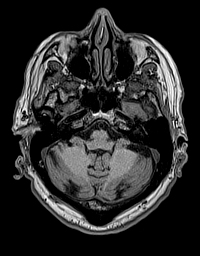
[im 60/160]
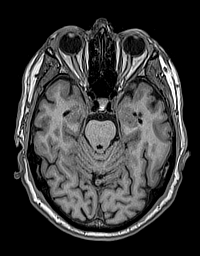
[im 80/160]
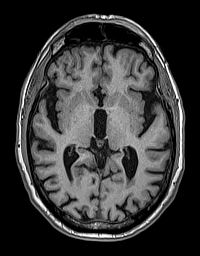
[im 100/160]
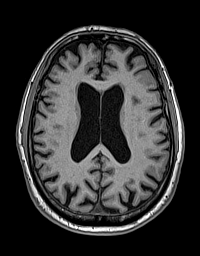
[im 120/160]
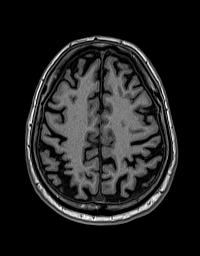
[im 140/160]
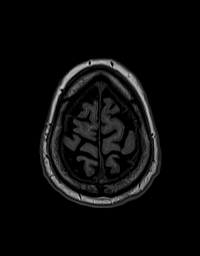
[im 160/160]
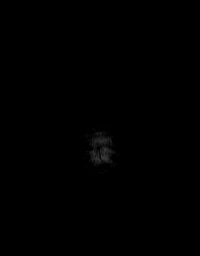

[Series 13: cor dwi_tracew · coronal · 5.0mm · 1.53mm/px · 4 of 60 slices shown]
[im 1/60]
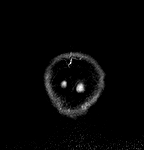
[im 20/60]
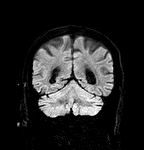
[im 40/60]
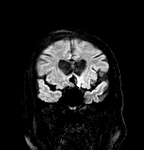
[im 60/60]
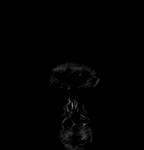

[Series 14: cor dwi_adc · coronal · 5.0mm · 1.53mm/px · 2 of 30 slices shown]
[im 1/30]
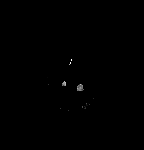
[im 30/30]
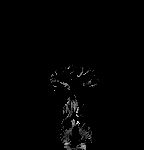

[Series 15: T2 post-contrast · coronal · 5.0mm · 0.57mm/px · 2 of 32 slices shown]
[im 1/32]
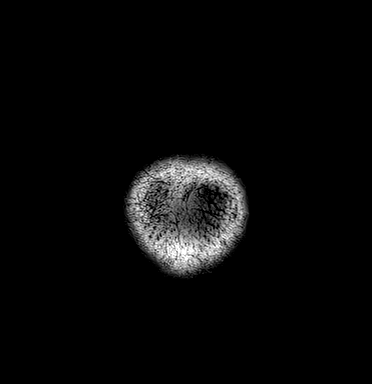
[im 32/32]
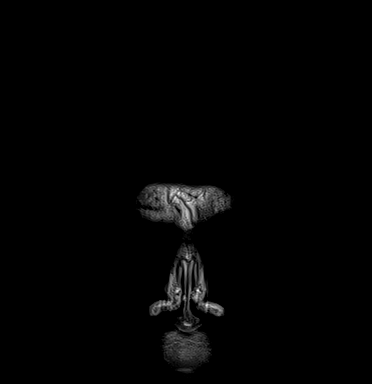

[Series 16: T1 post-contrast · axial · 1.0mm · 0.94mm/px · z∈[+14,+169]mm · 9 of 160 slices shown (1 of 3)]
[im 1/160]
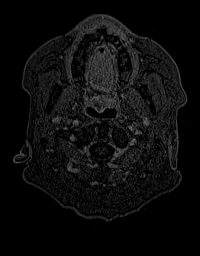
[im 20/160]
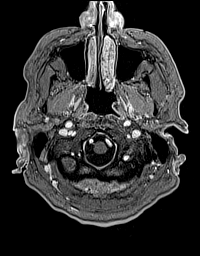
[im 40/160]
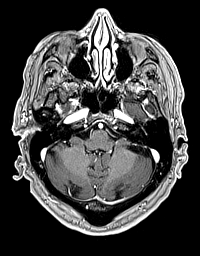
[im 60/160]
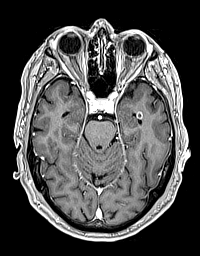
[im 80/160]
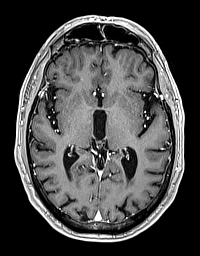
[im 100/160]
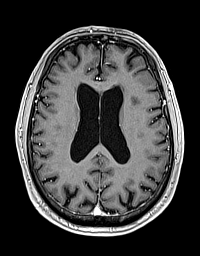
[im 120/160]
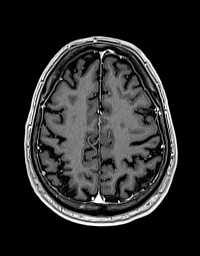
[im 140/160]
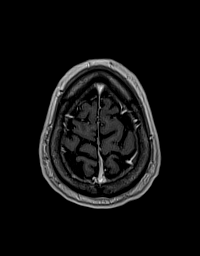
[im 160/160]
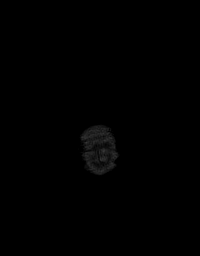

[Series 17: T1 post-contrast · coronal · 5.0mm · 0.43mm/px · 2 of 32 slices shown (2 of 3)]
[im 1/32]
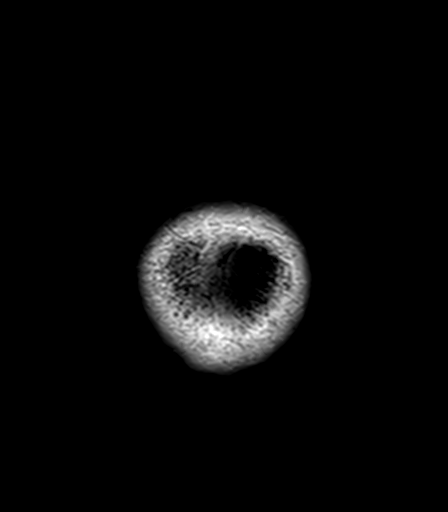
[im 32/32]
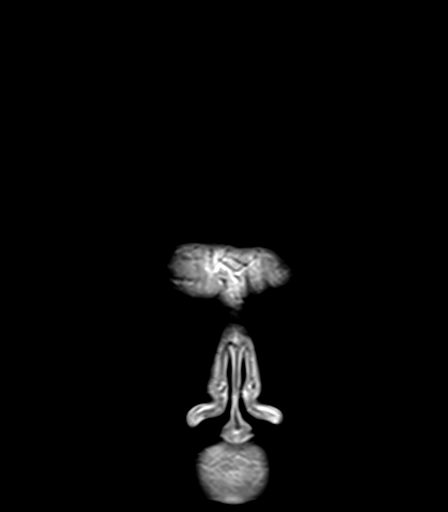

[Series 18: T1 post-contrast · sagittal · 5.0mm · 0.75mm/px · 2 of 26 slices shown (3 of 3)]
[im 1/26]
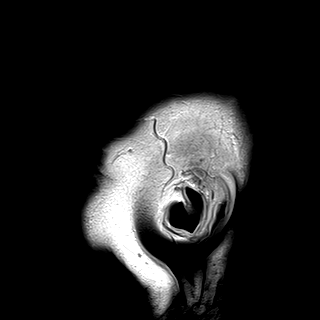
[im 26/26]
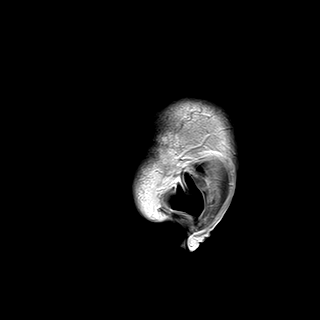

[48 of 48 positions shown; findings below may reference images not displayed]

FINDINGS: Brain: Solitary ring-enhancing lesion in the left temporal lobe,
adjacent to the otherwise normal temporal horn of the left lateral
ventricle. The lesion measures 10 mm in diameter. No associated
brain edema or mass effect.

No incidental infarct, hemorrhage, hydrocephalus, or collection.

Vascular: Normal flow voids and vascular enhancement

Skull and upper cervical spine: Normal marrow signal

Sinuses/Orbits: Negative

Other: These results will be called to the ordering clinician or
representative by the Radiologist Assistant, and communication
documented in the PACS or [REDACTED].
IMPRESSION: Solitary ring-enhancing metastasis measuring 1 cm in the left
temporal lobe.

## 2020-08-13 MED ORDER — SODIUM CHLORIDE 0.9 % IV SOLN
10.0000 mg | Freq: Once | INTRAVENOUS | Status: AC
  Administered 2020-08-13: 10 mg via INTRAVENOUS
  Filled 2020-08-13: qty 10

## 2020-08-13 MED ORDER — SODIUM CHLORIDE 0.9 % IV SOLN
200.0000 mg | Freq: Once | INTRAVENOUS | Status: AC
  Administered 2020-08-13: 200 mg via INTRAVENOUS
  Filled 2020-08-13: qty 8

## 2020-08-13 MED ORDER — SODIUM CHLORIDE 0.9 % IV SOLN
Freq: Once | INTRAVENOUS | Status: AC
  Filled 2020-08-13: qty 250

## 2020-08-13 MED ORDER — GADOBUTROL 1 MMOL/ML IV SOLN
8.0000 mL | Freq: Once | INTRAVENOUS | Status: AC | PRN
  Administered 2020-08-13: 8 mL via INTRAVENOUS

## 2020-08-13 MED ORDER — PALONOSETRON HCL INJECTION 0.25 MG/5ML
0.2500 mg | Freq: Once | INTRAVENOUS | Status: AC
  Administered 2020-08-13: 0.25 mg via INTRAVENOUS
  Filled 2020-08-13: qty 5

## 2020-08-13 MED ORDER — SODIUM CHLORIDE 0.9 % IV SOLN
150.0000 mg | Freq: Once | INTRAVENOUS | Status: AC
  Administered 2020-08-13: 150 mg via INTRAVENOUS
  Filled 2020-08-13: qty 150

## 2020-08-13 MED ORDER — SODIUM CHLORIDE 0.9 % IV SOLN
420.0000 mg | Freq: Once | INTRAVENOUS | Status: AC
  Administered 2020-08-13: 420 mg via INTRAVENOUS
  Filled 2020-08-13: qty 42

## 2020-08-13 MED ORDER — SODIUM CHLORIDE 0.9 % IV SOLN
500.0000 mg/m2 | Freq: Once | INTRAVENOUS | Status: AC
  Administered 2020-08-13: 1000 mg via INTRAVENOUS
  Filled 2020-08-13: qty 40

## 2020-08-13 NOTE — Progress Notes (Signed)
Met with patient at registration to introduce myself as Arboriculturist and to offer available resources.  Discussed one-time $1000 Radio broadcast assistant to assist with personal expenses while going through treatment.  Gave him my card if interested in applying and for any additional financial questions or concerns.

## 2020-08-13 NOTE — Patient Instructions (Signed)
[facility]  Discharge Instructions: Thank you for choosing [facility] to provide your oncology and hematology care.   If you have a lab appointment with the [facility], please go directly to the [facility] and check in at the registration area.   Wear comfortable clothing and clothing appropriate for easy access to any Portacath or PICC line.   We strive to give you quality time with your provider. You may need to reschedule your appointment if you arrive late (15 or more minutes).  Arriving late affects you and other patients whose appointments are after yours.  Also, if you miss three or more appointments without notifying the office, you may be dismissed from the clinic at the provider's discretion.      For prescription refill requests, have your pharmacy contact our office and allow 72 hours for refills to be completed.    Today you received the following chemotherapy and/or immunotherapy agents Keytruda, Alimta, and Carboplatin      To help prevent nausea and vomiting after your treatment, we encourage you to take your nausea medication as directed.  BELOW ARE SYMPTOMS THAT SHOULD BE REPORTED IMMEDIATELY: *FEVER GREATER THAN 100.4 F (38 C) OR HIGHER *CHILLS OR SWEATING *NAUSEA AND VOMITING THAT IS NOT CONTROLLED WITH YOUR NAUSEA MEDICATION *UNUSUAL SHORTNESS OF BREATH *UNUSUAL BRUISING OR BLEEDING *URINARY PROBLEMS (pain or burning when urinating, or frequent urination) *BOWEL PROBLEMS (unusual diarrhea, constipation, pain near the anus) TENDERNESS IN MOUTH AND THROAT WITH OR WITHOUT PRESENCE OF ULCERS (sore throat, sores in mouth, or a toothache) UNUSUAL RASH, SWELLING OR PAIN  UNUSUAL VAGINAL DISCHARGE OR ITCHING   Items with * indicate a potential emergency and should be followed up as soon as possible or go to the Emergency Department if any problems should occur.  Please show the CHEMOTHERAPY ALERT CARD or IMMUNOTHERAPY  ALERT CARD at check-in to the Emergency Department and triage nurse.  Should you have questions after your visit or need to cancel or reschedule your appointment, please contact [facility]  Dept: [phone]  and follow the prompts.  Office hours are 8:00 a.m. to 4:30 p.m. Monday - Friday. Please note that voicemails left after 4:00 p.m. may not be returned until the following business day.  We are closed weekends and major holidays. You have access to a nurse at all times for urgent questions. Please call the main number to the clinic Dept: [phone] and follow the prompts.   For any non-urgent questions, you may also contact your provider using MyChart. We now offer e-Visits for anyone 72 and older to request care online for non-urgent symptoms. For details visit [email or web address].   Also download the MyChart app! Go to the app store, search "MyChart", open the app, select [facility], and log in with your MyChart username and password.  Due to Covid, a mask is required upon entering the hospital/clinic. If you do not have a mask, one will be given to you upon arrival. For doctor visits, patients may have 1 support person aged 41 or older with them. For treatment visits, patients cannot have anyone with them due to current Covid guidelines and our immunocompromised population.   Pembrolizumab injection What is this medication? PEMBROLIZUMAB (pem broe liz ue mab) is a monoclonal antibody. It is used totreat certain types of cancer. This medicine may be used for other purposes; ask your health care provider orpharmacist if you have questions. COMMON BRAND NAME(S): [employer]  What should I tell my care team before I take this medication? They need to know if you have any of these conditions: autoimmune diseases like Crohn's disease, ulcerative colitis, or lupus have had or planning to have an allogeneic stem cell transplant (uses someone else's stem  cells) history of organ transplant history of chest radiation nervous system problems like myasthenia gravis or Guillain-Barre syndrome an unusual or allergic reaction to pembrolizumab, other medicines, foods, dyes, or preservatives pregnant or trying to get pregnant breast-feeding How should I use this medication? This medicine is for infusion into a vein. It is given by a health careprofessional in a hospital or clinic setting. A special MedGuide will be given to you before each treatment. Be sure to readthis information carefully each time. Talk to your pediatrician regarding the use of this medicine in children. While this drug may be prescribed for children as young as 6 months for selectedconditions, precautions do apply. Overdosage: If you think you have taken too much of this medicine contact apoison control center or emergency room at once. NOTE: This medicine is only for you. Do not share this medicine with others. What if I miss a dose? It is important not to miss your dose. Call your doctor or health careprofessional if you are unable to keep an appointment. What may interact with this medication? Interactions have not been studied. This list may not describe all possible interactions. Give your health care provider a list of all the medicines, herbs, non-prescription drugs, or dietary supplements you use. Also tell them if you smoke, drink alcohol, or use illegaldrugs. Some items may interact with your medicine. What should I watch for while using this medication? Your condition will be monitored carefully while you are receiving thismedicine. You may need blood work done while you are taking this medicine. Do not become pregnant while taking this medicine or for 4 months after stopping it. Women should inform their doctor if they wish to become pregnant or think they might be pregnant. There is a potential for serious side effects to an unborn child. Talk to your health care  professional or pharmacist for more information. Do not breast-feed an infant while taking this medicine orfor 4 months after the last dose. What side effects may I notice from receiving this medication? Side effects that you should report to your doctor or health care professionalas soon as possible: allergic reactions like skin rash, itching or hives, swelling of the face, lips, or tongue bloody or black, tarry breathing problems changes in vision chest pain chills confusion constipation cough diarrhea dizziness or feeling faint or lightheaded fast or irregular heartbeat fever flushing joint pain low blood counts - this medicine may decrease the number of white blood cells, red blood cells and platelets. You may be at increased risk for infections and bleeding. muscle pain muscle weakness pain, tingling, numbness in the hands or feet persistent headache redness, blistering, peeling or loosening of the skin, including inside the mouth signs and symptoms of high blood sugar such as dizziness; dry mouth; dry skin; fruity breath; nausea; stomach pain; increased hunger or thirst; increased urination signs and symptoms of kidney injury like trouble passing urine or change in the amount of urine signs and symptoms of liver injury like dark urine, light-colored stools, loss of appetite, nausea, right upper belly pain, yellowing of the eyes or skin sweating swollen lymph nodes weight loss Side effects that usually do not require medical attention (report to yourdoctor or health care professional  if they continue or are bothersome): decreased appetite hair loss tiredness This list may not describe all possible side effects. Call your doctor for medical advice about side effects. You may report side effects to FDA at1-800-FDA-1088. Where should I keep my medication? This drug is given in a hospital or clinic and will not be stored at home. NOTE: This sheet is a summary. It may not cover  all possible information. If you have questions about this medicine, talk to your doctor, pharmacist, orhealth care provider.  2022 Elsevier/Gold Standard (2018-11-22 21:44:53)  Pemetrexed injection What is this medication? PEMETREXED (PEM e TREX ed) is a chemotherapy drug used to treat lung cancers like non-small cell lung cancer and mesothelioma. It may also be used to treatother cancers. This medicine may be used for other purposes; ask your health care provider orpharmacist if you have questions. COMMON BRAND NAME(S): Alimta What should I tell my care team before I take this medication? They need to know if you have any of these conditions: infection (especially a virus infection such as chickenpox, cold sores, or herpes) kidney disease low blood counts, like low white cell, platelet, or red cell counts lung or breathing disease, like asthma radiation therapy an unusual or allergic reaction to pemetrexed, other medicines, foods, dyes, or preservative pregnant or trying to get pregnant breast-feeding How should I use this medication? This drug is given as an infusion into a vein. It is administered in a hospitalor clinic by a specially trained health care professional. Talk to your pediatrician regarding the use of this medicine in children.Special care may be needed. Overdosage: If you think you have taken too much of this medicine contact apoison control center or emergency room at once. NOTE: This medicine is only for you. Do not share this medicine with others. What if I miss a dose? It is important not to miss your dose. Call your doctor or health careprofessional if you are unable to keep an appointment. What may interact with this medication? This medicine may interact with the following medications: Ibuprofen This list may not describe all possible interactions. Give your health care provider a list of all the medicines, herbs, non-prescription drugs, or dietary supplements  you use. Also tell them if you smoke, drink alcohol, or use illegaldrugs. Some items may interact with your medicine. What should I watch for while using this medication? Visit your doctor for checks on your progress. This drug may make you feel generally unwell. This is not uncommon, as chemotherapy can affect healthy cells as well as cancer cells. Report any side effects. Continue your course oftreatment even though you feel ill unless your doctor tells you to stop. In some cases, you may be given additional medicines to help with side effects.Follow all directions for their use. Call your doctor or health care professional for advice if you get a fever, chills or sore throat, or other symptoms of a cold or flu. Do not treat yourself. This drug decreases your body's ability to fight infections. Try toavoid being around people who are sick. This medicine may increase your risk to bruise or bleed. Call your doctor orhealth care professional if you notice any unusual bleeding. Be careful brushing and flossing your teeth or using a toothpick because you may get an infection or bleed more easily. If you have any dental work done,tell your dentist you are receiving this medicine. Avoid taking products that contain aspirin, acetaminophen, ibuprofen, naproxen, or ketoprofen unless instructed by your doctor. These medicines  may hide afever. Call your doctor or health care professional if you get diarrhea or mouthsores. Do not treat yourself. To protect your kidneys, drink water or other fluids as directed while you aretaking this medicine. Do not become pregnant while taking this medicine or for 6 months after stopping it. Women should inform their doctor if they wish to become pregnant or think they might be pregnant. Men should not father a child while taking this medicine and for 3 months after stopping it. This may interfere with the ability to father a child. You should talk to your doctor or health care  professional if you are concerned about your fertility. There is a potential for serious side effects to an unborn child. Talk to your health care professional or pharmacist for more information. Do not breast-feed an infantwhile taking this medicine or for 1 week after stopping it. What side effects may I notice from receiving this medication? Side effects that you should report to your doctor or health care professionalas soon as possible: allergic reactions like skin rash, itching or hives, swelling of the face, lips, or tongue breathing problems redness, blistering, peeling or loosening of the skin, including inside the mouth signs and symptoms of bleeding such as bloody or black, tarry stools; red or dark-brown urine; spitting up blood or brown material that looks like coffee grounds; red spots on the skin; unusual bruising or bleeding from the eye, gums, or nose signs and symptoms of infection like fever or chills; cough; sore throat; pain or trouble passing urine signs and symptoms of kidney injury like trouble passing urine or change in the amount of urine signs and symptoms of liver injury like dark yellow or brown urine; general ill feeling or flu-like symptoms; light-colored stools; loss of appetite; nausea; right upper belly pain; unusually weak or tired; yellowing of the eyes or skin Side effects that usually do not require medical attention (report to yourdoctor or health care professional if they continue or are bothersome): constipation mouth sores nausea, vomiting unusually weak or tired This list may not describe all possible side effects. Call your doctor for medical advice about side effects. You may report side effects to FDA at1-800-FDA-1088. Where should I keep my medication? This drug is given in a hospital or clinic and will not be stored at home. NOTE: This sheet is a summary. It may not cover all possible information. If you have questions about this medicine, talk to  your doctor, pharmacist, orhealth care provider.  2022 Elsevier/Gold Standard (2017-02-09 16:11:33)  Carboplatin injection What is this medication? CARBOPLATIN (KAR boe pla tin) is a chemotherapy drug. It targets fast dividing cells, like cancer cells, and causes these cells to die. This medicine is usedto treat ovarian cancer and many other cancers. This medicine may be used for other purposes; ask your health care provider orpharmacist if you have questions. COMMON BRAND NAME(S): Paraplatin What should I tell my care team before I take this medication? They need to know if you have any of these conditions: blood disorders hearing problems kidney disease recent or ongoing radiation therapy an unusual or allergic reaction to carboplatin, cisplatin, other chemotherapy, other medicines, foods, dyes, or preservatives pregnant or trying to get pregnant breast-feeding How should I use this medication? This drug is usually given as an infusion into a vein. It is administered in [facility] or clinic by a specially trained health care professional. Talk to your pediatrician regarding the use of this medicine in children.Special care may be  needed. Overdosage: If you think you have taken too much of this medicine contact apoison control center or emergency room at once. NOTE: This medicine is only for you. Do not share this medicine with others. What if I miss a dose? It is important not to miss a dose. Call your doctor or health careprofessional if you are unable to keep an appointment. What may interact with this medication? medicines for seizures medicines to increase blood counts like filgrastim, pegfilgrastim, sargramostim some antibiotics like amikacin, gentamicin, neomycin, streptomycin, tobramycin vaccines Talk to your doctor or health care professional before taking any of thesemedicines: acetaminophen aspirin ibuprofen ketoprofen naproxen This list may not describe all possible  interactions. Give your health care provider a list of all the medicines, herbs, non-prescription drugs, or dietary supplements you use. Also tell them if you smoke, drink alcohol, or use illegaldrugs. Some items may interact with your medicine. What should I watch for while using this medication? Your condition will be monitored carefully while you are receiving this medicine. You will need important blood work done while you are taking thismedicine. This drug may make you feel generally unwell. This is not uncommon, as chemotherapy can affect healthy cells as well as cancer cells. Report any side effects. Continue your course of treatment even though you feel ill unless yourdoctor tells you to stop. In some cases, you may be given additional medicines to help with side effects.Follow all directions for their use. Call your doctor or health care professional for advice if you get a fever, chills or sore throat, or other symptoms of a cold or flu. Do not treat yourself. This drug decreases your body's ability to fight infections. Try toavoid being around people who are sick. This medicine may increase your risk to bruise or bleed. Call your doctor orhealth care professional if you notice any unusual bleeding. Be careful brushing and flossing your teeth or using a toothpick because you may get an infection or bleed more easily. If you have any dental work done,tell your dentist you are receiving this medicine. Avoid taking products that contain aspirin, acetaminophen, ibuprofen, naproxen, or ketoprofen unless instructed by your doctor. These medicines may hide afever. Do not become pregnant while taking this medicine. Women should inform their doctor if they wish to become pregnant or think they might be pregnant. There is a potential for serious side effects to an unborn child. Talk to your health care professional or pharmacist for more information. Do not breast-feed aninfant while taking this  medicine. What side effects may I notice from receiving this medication? Side effects that you should report to your doctor or health care professionalas soon as possible: allergic reactions like skin rash, itching or hives, swelling of the face, lips, or tongue signs of infection - fever or chills, cough, sore throat, pain or difficulty passing urine signs of decreased platelets or bleeding - bruising, pinpoint red spots on the skin, black, tarry stools, nosebleeds signs of decreased red blood cells - unusually weak or tired, fainting spells, lightheadedness breathing problems changes in hearing changes in vision chest pain high blood pressure low blood counts - This drug may decrease the number of white blood cells, red blood cells and platelets. You may be at increased risk for infections and bleeding. nausea and vomiting pain, swelling, redness or irritation at the injection site pain, tingling, numbness in the hands or feet problems with balance, talking, walking trouble passing urine or change in the amount of urine Side effects that  usually do not require medical attention (report to yourdoctor or health care professional if they continue or are bothersome): hair loss loss of appetite metallic taste in the mouth or changes in taste This list may not describe all possible side effects. Call your doctor for medical advice about side effects. You may report side effects to FDA at1-800-FDA-1088. Where should I keep my medication? This drug is given in a hospital or clinic and will not be stored at home. NOTE: This sheet is a summary. It may not cover all possible information. If you have questions about this medicine, talk to your doctor, pharmacist, orhealth care provider.  2022 Elsevier/Gold Standard (2007-03-28 14:38:05)

## 2020-08-14 ENCOUNTER — Telehealth: Payer: Self-pay | Admitting: *Deleted

## 2020-08-14 ENCOUNTER — Other Ambulatory Visit: Payer: Self-pay | Admitting: Internal Medicine

## 2020-08-14 DIAGNOSIS — C3492 Malignant neoplasm of unspecified part of left bronchus or lung: Secondary | ICD-10-CM

## 2020-08-17 ENCOUNTER — Other Ambulatory Visit: Payer: Self-pay | Admitting: Internal Medicine

## 2020-08-18 ENCOUNTER — Encounter: Payer: Self-pay | Admitting: Internal Medicine

## 2020-08-18 ENCOUNTER — Telehealth: Payer: Self-pay | Admitting: Medical Oncology

## 2020-08-18 NOTE — Telephone Encounter (Signed)
Constipation -LBM 4 days ago .  He has taken Benfiber , prune juice , fruits ,walking . He is drinking extra  fluids.   Sat - he took a Dulcolax suppository   Sun. He took a Ecologist..  I instructed pt to take another suppository today and MOM. Call tomorrow with update.

## 2020-08-18 NOTE — Progress Notes (Signed)
Called patient whom sent email regarding [employer].  Advised what is needed to apply and asked if he can bring on 08/21/20 to complete process. He states he can.  He has my card for any additional financial questions or concerns.

## 2020-08-19 NOTE — Progress Notes (Signed)
Thoracic Location of Tumor / Histology: Left Upper Lobe Lung  Patient presented with complaints of pain on the left side of the chest as well as back.  The pain worsened and he was sent for chest x ray.  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  PET 08/01/2020: 14 mm left upper lobe pulmonary nodule is hypermetabolic and consistent with primary lung neoplasm.  Metastatic left AP window node.  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  No abdominal/pelvic or osseous metastatic disease.  Biopsies of Pleural Fluid 07/21/2020   Tobacco/Marijuana/Snuff/ETOH use: Non Smoker  Past/Anticipated interventions by cardiothoracic surgery, if any:   Past/Anticipated interventions by medical oncology, if any:  Dr. 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 -If the molecular studies showed an actionable mutation, the patient will be treated with targeted therapy and we will cancel the systemic chemotherapy. -He is expected to start the first cycle of his treatment on August 13, 2020 if the molecular studies are negative. -Next chemo 09/04/2020  Dose of Decadron, if applicable: No  Recent neurologic symptoms, if any:  Seizures: No Headaches: Mild Nausea: No Dizziness/ataxia: No Difficulty with hand coordination: No Focal numbness/weakness: No Visual deficits/changes: No Confusion/Memory deficits: No   Signs/Symptoms Weight changes, if any: no Respiratory complaints,  if any: Notes some pain with deep inspiration, felt most in the anterior. Hemoptysis, if any: Has occasional productive cough with frothy mucus, small amounts. Pain issues, if any:  He notes discomfort in his abdomen radiating around to his back.  SAFETY ISSUES: Prior radiation? no Pacemaker/ICD?  No Possible current pregnancy? N/a Is the patient on methotrexate? No  Current Complaints / other details:

## 2020-08-21 ENCOUNTER — Inpatient Hospital Stay: Payer: Medicare HMO | Admitting: Internal Medicine

## 2020-08-21 ENCOUNTER — Ambulatory Visit
Admission: RE | Admit: 2020-08-21 | Discharge: 2020-08-21 | Disposition: A | Discharge status: Home / Self Care | Payer: Medicare HMO | Source: Ambulatory Visit | Attending: Radiation Oncology | Admitting: Radiation Oncology

## 2020-08-21 ENCOUNTER — Other Ambulatory Visit: Payer: Self-pay | Admitting: Radiation Therapy

## 2020-08-21 ENCOUNTER — Encounter: Payer: Self-pay | Admitting: Radiation Oncology

## 2020-08-21 ENCOUNTER — Other Ambulatory Visit: Payer: Self-pay

## 2020-08-21 ENCOUNTER — Ambulatory Visit: Payer: Medicare Other | Admitting: Physician Assistant

## 2020-08-21 ENCOUNTER — Inpatient Hospital Stay: Payer: Medicare HMO

## 2020-08-21 ENCOUNTER — Encounter: Payer: Self-pay | Admitting: Internal Medicine

## 2020-08-21 VITALS — BP 136/82 | HR 75 | Temp 96.6°F | Resp 18 | Ht 73.0 in | Wt 178.4 lb

## 2020-08-21 VITALS — BP 145/79 | HR 77 | Temp 97.7°F | Resp 19 | Ht 73.0 in | Wt 179.2 lb

## 2020-08-21 DIAGNOSIS — Z5112 Encounter for antineoplastic immunotherapy: Secondary | ICD-10-CM

## 2020-08-21 DIAGNOSIS — Z7982 Long term (current) use of aspirin: Secondary | ICD-10-CM | POA: Insufficient documentation

## 2020-08-21 DIAGNOSIS — C7931 Secondary malignant neoplasm of brain: Secondary | ICD-10-CM | POA: Diagnosis not present

## 2020-08-21 DIAGNOSIS — C782 Secondary malignant neoplasm of pleura: Secondary | ICD-10-CM | POA: Diagnosis not present

## 2020-08-21 DIAGNOSIS — C3492 Malignant neoplasm of unspecified part of left bronchus or lung: Secondary | ICD-10-CM

## 2020-08-21 DIAGNOSIS — K5730 Diverticulosis of large intestine without perforation or abscess without bleeding: Secondary | ICD-10-CM | POA: Diagnosis not present

## 2020-08-21 DIAGNOSIS — Z5111 Encounter for antineoplastic chemotherapy: Secondary | ICD-10-CM | POA: Diagnosis not present

## 2020-08-21 DIAGNOSIS — Z79899 Other long term (current) drug therapy: Secondary | ICD-10-CM | POA: Insufficient documentation

## 2020-08-21 DIAGNOSIS — J90 Pleural effusion, not elsewhere classified: Secondary | ICD-10-CM | POA: Diagnosis not present

## 2020-08-21 DIAGNOSIS — J910 Malignant pleural effusion: Secondary | ICD-10-CM | POA: Insufficient documentation

## 2020-08-21 HISTORY — DX: Malignant neoplasm of unspecified part of unspecified bronchus or lung: C34.90

## 2020-08-21 LAB — CBC WITH DIFFERENTIAL (CANCER CENTER ONLY)
Abs Immature Granulocytes: 0.01 10*3/uL (ref 0.00–0.07)
Basophils Absolute: 0 10*3/uL (ref 0.0–0.1)
Basophils Relative: 1 %
Eosinophils Absolute: 0.3 10*3/uL (ref 0.0–0.5)
Eosinophils Relative: 7 %
HCT: 35.1 % — ABNORMAL LOW (ref 39.0–52.0)
Hemoglobin: 12.1 g/dL — ABNORMAL LOW (ref 13.0–17.0)
Immature Granulocytes: 0 %
Lymphocytes Relative: 36 %
Lymphs Abs: 1.3 10*3/uL (ref 0.7–4.0)
MCH: 31 pg (ref 26.0–34.0)
MCHC: 34.5 g/dL (ref 30.0–36.0)
MCV: 90 fL (ref 80.0–100.0)
Monocytes Absolute: 0.3 10*3/uL (ref 0.1–1.0)
Monocytes Relative: 9 %
Neutro Abs: 1.7 10*3/uL (ref 1.7–7.7)
Neutrophils Relative %: 47 %
Platelet Count: 151 10*3/uL (ref 150–400)
RBC: 3.9 MIL/uL — ABNORMAL LOW (ref 4.22–5.81)
RDW: 11.9 % (ref 11.5–15.5)
WBC Count: 3.5 10*3/uL — ABNORMAL LOW (ref 4.0–10.5)
nRBC: 0 % (ref 0.0–0.2)

## 2020-08-21 LAB — CMP (CANCER CENTER ONLY)
ALT: 15 U/L (ref 0–44)
AST: 18 U/L (ref 15–41)
Albumin: 3.6 g/dL (ref 3.5–5.0)
Alkaline Phosphatase: 99 U/L (ref 38–126)
Anion gap: 9 (ref 5–15)
BUN: 14 mg/dL (ref 8–23)
CO2: 26 mmol/L (ref 22–32)
Calcium: 9.2 mg/dL (ref 8.9–10.3)
Chloride: 103 mmol/L (ref 98–111)
Creatinine: 1.01 mg/dL (ref 0.61–1.24)
GFR, Estimated: >60 mL/min (ref >60)
Glucose, Bld: 108 mg/dL — ABNORMAL HIGH (ref 70–99)
Potassium: 4.4 mmol/L (ref 3.5–5.1)
Sodium: 138 mmol/L (ref 135–145)
Total Bilirubin: 0.3 mg/dL (ref 0.3–1.2)
Total Protein: 6.4 g/dL — ABNORMAL LOW (ref 6.5–8.1)

## 2020-08-21 NOTE — Progress Notes (Signed)
Met with patient at registration whom brought income for [employer].  Patient approved for one-time $1000 [employer] grant to assist with personal expenses while going through treatment.  He has a copy of the approval letter and expense sheet along with the Outpatient pharmacy information. He received a gift card today from his grant.  He has my card and paperwork in green folder for any additional financial questions or concerns.

## 2020-08-21 NOTE — Progress Notes (Signed)
[facility] [phone]   [phone]  OFFICE PROGRESS NOTE  [name], MD [address] [place] [address]  DIAGNOSIS: Stage IV (T1b, N2, M1a) non-small cell lung cancer, adenocarcinoma diagnosed in July 2022 and presented with left upper lobe nodule in addition to AP window lymphadenopathy and left-sided malignant pleural effusion as well as pleural metastatic disease.  The patient also has solitary left temporal brain metastasis.  Biomarker Findings Microsatellite status - MS-Stable Tumor Mutational Burden - 4 Muts/Mb Genomic Findings For a complete list of the genes assayed, please refer to the Appendix. [id number] H846N PTEN splice site 629-5M>W - subclonal? DOT1L S911L - subclonal? RAD21 S271* RB1 loss exons 3-23 TP53 N33f*34 8 Disease relevant genes with no reportable alterations: ALK, BRAF, EGFR, ERBB2, KRAS, MET, RET, ROS1  PRIOR THERAPY: None  CURRENT THERAPY: Systemic chemotherapy with carboplatin for AUC of 5, Alimta 500 Mg/M2 and Keytruda 200 Mg IV every 3 weeks.  First dose August 13, 2020.  We will start the treatment if the molecular markers are negative for actionable mutation.  Results is expected on August 11, 2020.  INTERVAL HISTORY: Peter Wirz80y.o. male returns to the clinic today for follow-up visit accompanied by his wife.  The patient is feeling fine today with no concerning complaints.  He tolerated the first cycle of his treatment well except for constipation that lasted for few days.  He used several medications with no improvement until he started using milk of magnesia and he did have a bowel movement.  He denied having any current chest pain, shortness of breath, cough or hemoptysis.  He denied having any fever or chills.  He has no nausea, vomiting, diarrhea or constipation.  He had molecular studies by [facility] 1 that showed no actionable mutations.  The patient tolerated the first week of his  systemic chemotherapy with carboplatin, Alimta and Keytruda fairly well.  He was found on MRI of the brain to have a solitary brain metastasis and the patient was referred to Dr. MLisbeth Renshawfor consideration of SRS to this brain lesion.  He is scheduled to see him later today.  MEDICAL HISTORY:No past medical history on file.  ALLERGIES:  is allergic to venlafaxine.  MEDICATIONS:  Current Outpatient Medications  Medication Sig Dispense Refill   aspirin 81 MG EC tablet Take by mouth.     Bacillus Coagulans-Inulin (PROBIOTIC) 1-250 BILLION-MG CAPS Take 1 tablet by mouth daily.     coal tar-salicylic acid 2 % shampoo Apply topically daily as needed for itching.     DULoxetine (CYMBALTA) 30 MG capsule Take 30 mg by mouth daily.     fluocinonide cream (LIDEX) 04.13% Apply 1 application topically 2 (two) times daily.     folic acid (FOLVITE) 1 MG tablet Take 1 tablet (1 mg total) by mouth daily. 30 tablet 4   Glucosamine-Chondroitin 250-200 MG TABS Take 1 tablet by mouth daily.     ketoconazole (NIZORAL) 2 % cream Apply 1 application topically daily. (Patient not taking: Reported on 07/14/2020)     Krill Oil (OMEGA-3) 500 MG CAPS Take by mouth.     loratadine (CLARITIN) 10 MG tablet Take by mouth.     Magnesium 250 MG TABS Take by mouth.     omeprazole (PRILOSEC) 40 MG capsule Take 40 mg by mouth daily.     OVER THE COUNTER MEDICATION PNEUMOTROPHIN PMG     prochlorperazine (COMPAZINE) 10 MG tablet TAKE 1 TABLET(10 MG)  BY MOUTH EVERY 6 HOURS AS NEEDED FOR NAUSEA OR VOMITING 30 tablet 0   No current facility-administered medications for this visit.    SURGICAL HISTORY:  Past Surgical History:  Procedure Laterality Date   KNEE SURGERY     orthoscopic     TONSILLECTOMY AND ADENOIDECTOMY      REVIEW OF SYSTEMS:  Constitutional: positive for fatigue Eyes: negative Ears, nose, mouth, throat, and face: negative Respiratory: positive for dyspnea on exertion Cardiovascular:  negative Gastrointestinal: negative Genitourinary:negative Integument/breast: negative Hematologic/lymphatic: negative Musculoskeletal:negative Neurological: negative Behavioral/Psych: negative Endocrine: negative Allergic/Immunologic: negative   PHYSICAL EXAMINATION: General appearance: alert, cooperative, fatigued, and no distress Head: Normocephalic, without obvious abnormality, atraumatic Neck: no adenopathy, no JVD, supple, symmetrical, trachea midline, and thyroid not enlarged, symmetric, no tenderness/mass/nodules Lymph nodes: Cervical, supraclavicular, and axillary nodes normal. Resp: clear to auscultation bilaterally Back: symmetric, no curvature. ROM normal. No CVA tenderness. Cardio: regular rate and rhythm, S1, S2 normal, no murmur, click, rub or gallop GI: soft, non-tender; bowel sounds normal; no masses,  no organomegaly Extremities: extremities normal, atraumatic, no cyanosis or edema Neurologic: Alert and oriented X 3, normal strength and tone. Normal symmetric reflexes. Normal coordination and gait  ECOG PERFORMANCE STATUS: 1 - Symptomatic but completely ambulatory  Blood pressure (!) 145/79, pulse 77, temperature 97.7 F (36.5 C), temperature source Tympanic, resp. rate 19, height 6' 1" (1.854 m), weight 179 lb 3.2 oz (81.3 kg), SpO2 99 %.  LABORATORY DATA: Lab Results  Component Value Date   WBC 3.5 (L) 08/21/2020   HGB 12.1 (L) 08/21/2020   HCT 35.1 (L) 08/21/2020   MCV 90.0 08/21/2020   PLT 151 08/21/2020      Chemistry      Component Value Date/Time   NA 141 08/11/2020 0903   K 4.8 08/11/2020 0903   CL 103 08/11/2020 0903   CO2 28 08/11/2020 0903   BUN 16 08/11/2020 0903   CREATININE 1.14 08/11/2020 0903      Component Value Date/Time   CALCIUM 10.4 (H) 08/11/2020 0903   ALKPHOS 90 08/11/2020 0903   AST 18 08/11/2020 0903   ALT 14 08/11/2020 0903   BILITOT 0.5 08/11/2020 0903       RADIOGRAPHIC STUDIES: MR BRAIN W WO CONTRAST  Result  Date: 08/13/2020 CLINICAL DATA:  Non-small cell lung cancer staging EXAM: MRI HEAD WITHOUT AND WITH CONTRAST TECHNIQUE: Multiplanar, multiecho pulse sequences of the brain and surrounding structures were obtained without and with intravenous contrast. CONTRAST:  78m GADAVIST GADOBUTROL 1 MMOL/ML IV SOLN COMPARISON:  None. FINDINGS: Brain: Solitary ring-enhancing lesion in the left temporal lobe, adjacent to the otherwise normal temporal horn of the left lateral ventricle. The lesion measures 10 mm in diameter. No associated brain edema or mass effect. No incidental infarct, hemorrhage, hydrocephalus, or collection. Vascular: Normal flow voids and vascular enhancement Skull and upper cervical spine: Normal marrow signal Sinuses/Orbits: Negative Other: These results will be called to the ordering clinician or representative by the Radiologist Assistant, and communication documented in the PACS or [employer] IMPRESSION: Solitary ring-enhancing metastasis measuring 1 cm in the left temporal lobe. Electronically Signed   By: JMonte FantasiaM.D.   On: 08/13/2020 21:03   NM PET Image Initial (PI) Skull Base To Thigh  Result Date: 08/03/2020 CLINICAL DATA:  Initial treatment strategy for left lung nodule seen on outside chest CT. EXAM: NUCLEAR MEDICINE PET SKULL BASE TO THIGH TECHNIQUE: 8.93 mCi F-18 FDG was injected intravenously. Full-ring PET imaging was performed from  the skull base to thigh after the radiotracer. CT data was obtained and used for attenuation correction and anatomic localization. Fasting blood glucose: 96 mg/dl COMPARISON:  Chest x-ray 07/11/2020 FINDINGS: Mediastinal blood pool activity: SUV max 2.39 Liver activity: SUV max NA NECK: No hypermetabolic lymph nodes in the neck. Incidental CT findings: none CHEST: The 14 mm subpleural left upper lobe pulmonary nodule anteriorly on image number 40/9 is hypermetabolic with SUV max of 81.19. 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  10 mm AP window node on image number 14/7 is hypermetabolic with SUV max of 8.29. No other hypermetabolic mediastinal or hilar lymph nodes. There are several small pleural nodules which are hypermetabolic and consistent with pleural metastases. These are difficult to see on the noncontrast CT scan. SUV max ranges 4.4-3.7. Associated malignant left pleural effusion. No chest wall mass, supraclavicular or axillary adenopathy. Incidental CT findings: Moderate aortic and coronary artery calcifications. No pulmonary nodules to suggest pulmonary metastatic disease. ABDOMEN/PELVIS: No abnormal hypermetabolic activity within the liver, pancreas, adrenal glands, or spleen. No hypermetabolic lymph nodes in the abdomen or pelvis. Incidental CT findings: Scattered aortic calcifications but no aneurysm. Sigmoid colon diverticulosis. Mild prostate gland enlargement. SKELETON: No findings suspicious for osseous metastatic disease. Incidental CT findings: none IMPRESSION: 14 mm left upper lobe pulmonary nodule is hypermetabolic and consistent primary lung neoplasm. Metastatic left AP window node. 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 No abdominal/pelvic or osseous metastatic disease. Electronically Signed   By: Marijo Sanes M.D.   On: 08/03/2020 15:20    ASSESSMENT AND PLAN: This is a very pleasant 80 years old white male recently diagnosed with a stage IV (T1b, N2, M1 a) non-small cell lung cancer, adenocarcinoma presented with left upper lobe lung nodule in addition to mediastinal lymphadenopathy and pleural-based metastasis as well as malignant left pleural effusion diagnosed in July 2022. His molecular studies by [facility] showed no actionable mutations. The patient is currently undergoing systemic chemotherapy with carboplatin for AUC of 5, Alimta 500 Mg/M2 and Keytruda 200 Mg IV every 3 weeks.  He is status post 1 cycle started last week. He tolerated the first week of his treatment  well except for constipation improved with milk of magnesia. The patient also had MRI of the brain performed recently that showed solitary brain metastasis. I referred the patient to Dr. [name] for consideration of SRS to the brain tumor. I recommended for the patient to continue his current treatment with systemic chemotherapy with the same regimen and he is expected to start cycle #2 in 2 weeks.  He will come back for follow-up visit at that time. For the recurrent pleural effusion, will continue to monitor closely for now and the patient will be considered for ultrasound-guided left thoracentesis or Pleurx catheter placement if needed. He was advised to call immediately if he has any other concerning symptoms in the interval. The patient voices understanding of current disease status and treatment options and is in agreement with the current care plan.  All questions were answered. The patient knows to call the clinic with any problems, questions or concerns. We can certainly see the patient much sooner if necessary.  The total time spent in the appointment was 55 minutes.  Disclaimer: This note was dictated with voice recognition software. Similar sounding words can inadvertently be transcribed and may not be corrected upon review.

## 2020-08-22 ENCOUNTER — Encounter: Payer: Self-pay | Admitting: General Practice

## 2020-08-22 ENCOUNTER — Encounter: Payer: Self-pay | Admitting: *Deleted

## 2020-08-22 NOTE — Progress Notes (Signed)
Oncology Nurse Navigator Documentation  Oncology Nurse Navigator Flowsheets 08/22/2020  Abnormal Finding Date 07/01/2020  Confirmed Diagnosis Date 07/21/2020  Diagnosis Status Confirmed Diagnosis Complete  Planned Course of Treatment Chemotherapy;Targeted Therapy  Phase of Treatment Targeted Therapy  Chemotherapy Actual Start Date: 08/13/2020  Targeted Therapy Actual Start Date: 08/13/2020  Navigator Follow Up Date: 09/04/2020  Navigator Follow Up Reason: Follow-up Appointment  Navigator Location [facility]  Navigator Encounter Type Other:  Treatment Initiated Date 08/13/2020  Patient Visit Type Other  Treatment Phase Treatment  Barriers/Navigation Needs Coordination of Care  Interventions Other  Acuity Level 2-Minimal Needs (1-2 Barriers Identified)  Time Spent with Patient 30

## 2020-08-22 NOTE — Progress Notes (Signed)
[facility]  Initial Assessment   Peter Brown is a 80 y.o. year old male contacted by phone. Clinical Social Work was referred by radiation oncology for assessment of psychosocial needs.   SDOH (Social Determinants of Health) assessments performed: Yes SDOH Interventions    Flowsheet Row Most Recent Value  SDOH Interventions   Food Insecurity Interventions Intervention Not Indicated  Financial Strain Interventions Financial Counselor  Housing Interventions Intervention Not Indicated  Stress Interventions Intervention Not Indicated  Social Connections Interventions Intervention Not Indicated       Distress Screen completed: Yes ONCBCN DISTRESS SCREENING 08/21/2020  Screening Type Initial Screening  Distress experienced in past week (1-10) 7  Practical problem type Insurance  Emotional problem type Nervousness/Anxiety;Adjusting to illness  Spiritual/Religous concerns type Facing my mortality  Physical Problem type Sleep/insomnia;Mouth sores/swallowing;Constipation/diarrhea  Other Contact via phone [phone]      Family/Social Information:  Housing Arrangement: patient lives with wife of 63 years , lives in [place] members/support persons in your life? Family, Friends/Colleagues, Church, and [employer] concerns: yes, gas is difficult to afford, comes a long way - over 70 miles.    Employment: Retired. Income source: Paediatric nurse concerns:  limited financial resources Type of concern: Transportation and Medical bills Food access concerns: no Religious or spiritual practice: yes, faith is very important to him Medication Concerns: no  Services Currently in place:  none  Coping/ Adjustment to diagnosis: Patient understands treatment plan and what happens next? Newly diagnosed w Stage IV lung cancer, previously healthy, walked every day and was quite active. Had painful area in his side, thought was pulled  muscle, pursued w PCP, multiple tests.  Eventually sent for CT Scan which showed fluid, was suspicious for cancer.   Concerns about diagnosis and/or treatment:  medical issues, constipation, mouth sores, feeling of burning on forehead.   Patient reported stressors: Facing my mortality and "the time I have left here" Hopes and priorities: "hoping chemo works", also has spot on brain that will be removed on 8/31 w radiation Patient enjoys time with family/ friends and trout fishing, yard and housework, "I enjoy eating",  Current coping skills/ strengths: Ability for insight, Average or above average intelligence, Capable of independent living, Armed forces logistics/support/administrative officer, Religious Affiliation, and Supportive family/friends    SUMMARY:  Interventions: Discussed common feeling and emotions when being diagnosed with cancer, and the importance of support during treatment Informed patient of the support team roles and support services at [facility] Provided [place] contact information and encouraged patient to call with any questions or concerns Provided patient with information about [place], [employer], [employer].  Encouraged participation in support groups for patient and spouse.    Follow Up Plan: Patient will contact CSW with any support or resource needs Patient verbalizes understanding of plan: Yes    Beverely Pace , [place], LCSW Clinical Social Worker Phone:  [phone]

## 2020-08-24 NOTE — Progress Notes (Signed)
Radiation Oncology         [phone] ________________________________  Name: Peter Brown        MRN: [id number]  Date of Service: 08/21/2020 DOB: [birth year]  [id number], Eddie Dibbles, MD  Curt Bears, MD     REFERRING PHYSICIAN: Curt Bears, MD   DIAGNOSIS: The primary encounter diagnosis was Adenocarcinoma of left lung, stage 4 ([place]). Diagnoses of Secondary malignant neoplasm of brain [facility]) and Brain metastases ([place]) were also pertinent to this visit.   HISTORY OF PRESENT ILLNESS: Peter Brown is a 80 y.o. male seen at the request of Dr. Julien Nordmann for diagnosis of stage IV lung cancer.  The patient had been experiencing left chest wall discomfort and discomfort in the back, he was evaluated with chest x-ray which showed a left pleural effusion.  He was sent for evaluation with IR and underwent thoracentesis with successful drainage of 650 mL of pleural fluid on 07/21/2020.  Final cytology showed malignant cells consistent with adenocarcinoma.  He underwent pet imaging on 08/01/2020 which revealed a 14 mm left upper lobe pulmonary nodule that was hypermetabolic with an SUV of up to 10.1.  AP window adenopathy was hypermetabolic as well as some smaller pleural nodules with ranging SUVs between 3 and 4 consistent with an associated left malignant pleural effusion.  No other evidence of distant disease was identified.  Screening MRI for staging on 08/13/2020 showed a 1 cm enhancing lesion in the left temporal lobe.  He began systemic chemotherapy with Dr. Julien Nordmann and has completed 1 cycle of chemo immunotherapy on 08/13/2020.  Given the finding in his brain he is seen today to discuss options of radiotherapy.    PREVIOUS RADIATION THERAPY: No   PAST MEDICAL HISTORY:  Past Medical History:  Diagnosis Date   Lung cancer ([place])    Lung cancer metastatic to brain [facility])        PAST SURGICAL HISTORY: Past Surgical History:  Procedure Laterality Date   KNEE SURGERY     orthoscopic      TONSILLECTOMY AND ADENOIDECTOMY       FAMILY HISTORY: History reviewed. No pertinent family history.   SOCIAL HISTORY:  reports that he has never smoked. He has never used smokeless tobacco. He reports that he does not drink alcohol and does not use drugs.   ALLERGIES: Venlafaxine   MEDICATIONS:  Current Outpatient Medications  Medication Sig Dispense Refill   aspirin 81 MG EC tablet Take by mouth.     Bacillus Coagulans-Inulin (PROBIOTIC) 1-250 BILLION-MG CAPS Take 1 tablet by mouth daily.     coal tar-salicylic acid 2 % shampoo Apply topically daily as needed for itching.     DULoxetine (CYMBALTA) 30 MG capsule Take 30 mg by mouth daily.     fluocinonide cream (LIDEX) 2.97 % Apply 1 application topically 2 (two) times daily.     folic acid (FOLVITE) 1 MG tablet Take 1 tablet (1 mg total) by mouth daily. 30 tablet 4   Glucosamine-Chondroitin 250-200 MG TABS Take 1 tablet by mouth daily.     Krill Oil (OMEGA-3) 500 MG CAPS Take by mouth.     loratadine (CLARITIN) 10 MG tablet Take by mouth.     Magnesium 250 MG TABS Take by mouth.     omeprazole (PRILOSEC) 40 MG capsule Take 40 mg by mouth daily.     OVER THE COUNTER MEDICATION PNEUMOTROPHIN PMG     prochlorperazine (COMPAZINE) 10 MG tablet TAKE 1 TABLET(10 MG) BY MOUTH EVERY 6 HOURS AS  NEEDED FOR NAUSEA OR VOMITING 30 tablet 0   ketoconazole (NIZORAL) 2 % cream Apply 1 application topically daily. (Patient not taking: No sig reported)     No current facility-administered medications for this encounter.     REVIEW OF SYSTEMS: On review of systems, the patient reports that overall he is feeling quite well.  He denies any shortness of breath or chest pain.  He has been experiencing some pain with deep inspiration and small amounts of hemoptysis mostly just a productive cough with frothiness in the mucus.  He has had modest weight loss up to 20 pounds without intention.  No other complaints such as dizziness headache seizures visual  or auditory deficits or confusion are noted.  No other complaints are verbalized.  PHYSICAL EXAM:  Wt Readings from Last 3 Encounters:  08/21/20 178 lb 6 oz (80.9 kg)  08/21/20 179 lb 3.2 oz (81.3 kg)  08/13/20 180 lb 12 oz (82 kg)   Temp Readings from Last 3 Encounters:  08/21/20 (!) 96.6 F (35.9 C) (Temporal)  08/21/20 97.7 F (36.5 C) (Tympanic)  08/13/20 98.8 F (37.1 C) (Oral)   BP Readings from Last 3 Encounters:  08/21/20 136/82  08/21/20 (!) 145/79  08/13/20 128/71   Pulse Readings from Last 3 Encounters:  08/21/20 75  08/21/20 77  08/13/20 91   Pain Assessment Pain Score: 2  Pain Loc: Abdomen/10  In general this is a well appearing Caucasian male in no acute distress.  He's alert and oriented x4 and appropriate throughout the examination. Cardiopulmonary assessment is negative for acute distress and he exhibits normal effort.     ECOG = 1  0 - Asymptomatic (Fully active, able to carry on all predisease activities without restriction)  1 - Symptomatic but completely ambulatory (Restricted in physically strenuous activity but ambulatory and able to carry out work of a light or sedentary nature. For example, light housework, office work)  2 - Symptomatic, <50% in bed during the day (Ambulatory and capable of all self care but unable to carry out any work activities. Up and about more than 50% of waking hours)  3 - Symptomatic, >50% in bed, but not bedbound (Capable of only limited self-care, confined to bed or chair 50% or more of waking hours)  4 - Bedbound (Completely disabled. Cannot carry on any self-care. Totally confined to bed or chair)  5 - Death   Eustace Pen MM, Creech RH, Tormey DC, et al. [phone]). "Toxicity and response criteria of the [facility] Group". [place] Oncol. 5 (6): 649-55    LABORATORY DATA:  Lab Results  Component Value Date   WBC 3.5 (L) 08/21/2020   HGB 12.1 (L) 08/21/2020   HCT 35.1 (L) 08/21/2020   MCV 90.0  08/21/2020   PLT 151 08/21/2020   Lab Results  Component Value Date   NA 138 08/21/2020   K 4.4 08/21/2020   CL 103 08/21/2020   CO2 26 08/21/2020   Lab Results  Component Value Date   ALT 15 08/21/2020   AST 18 08/21/2020   ALKPHOS 99 08/21/2020   BILITOT 0.3 08/21/2020      RADIOGRAPHY: MR BRAIN W WO CONTRAST  Result Date: 08/13/2020 CLINICAL DATA:  Non-small cell lung cancer staging EXAM: MRI HEAD WITHOUT AND WITH CONTRAST TECHNIQUE: Multiplanar, multiecho pulse sequences of the brain and surrounding structures were obtained without and with intravenous contrast. CONTRAST:  2m GADAVIST GADOBUTROL 1 MMOL/ML IV SOLN COMPARISON:  None. FINDINGS: Brain: Solitary  ring-enhancing lesion in the left temporal lobe, adjacent to the otherwise normal temporal horn of the left lateral ventricle. The lesion measures 10 mm in diameter. No associated brain edema or mass effect. No incidental infarct, hemorrhage, hydrocephalus, or collection. Vascular: Normal flow voids and vascular enhancement Skull and upper cervical spine: Normal marrow signal Sinuses/Orbits: Negative Other: These results will be called to the ordering clinician or representative by the Radiologist Assistant, and communication documented in the PACS or [employer]. IMPRESSION: Solitary ring-enhancing metastasis measuring 1 cm in the left temporal lobe. Electronically Signed   By: Monte Fantasia M.D.   On: 08/13/2020 21:03   NM PET Image Initial (PI) Skull Base To Thigh  Result Date: 08/03/2020 CLINICAL DATA:  Initial treatment strategy for left lung nodule seen on outside chest CT. EXAM: NUCLEAR MEDICINE PET SKULL BASE TO THIGH TECHNIQUE: 8.93 mCi F-18 FDG was injected intravenously. Full-ring PET imaging was performed from the skull base to thigh after the radiotracer. CT data was obtained and used for attenuation correction and anatomic localization. Fasting blood glucose: 96 mg/dl COMPARISON:  Chest x-ray 07/11/2020 FINDINGS:  Mediastinal blood pool activity: SUV max 2.39 Liver activity: SUV max NA NECK: No hypermetabolic lymph nodes in the neck. Incidental CT findings: none CHEST: The 14 mm subpleural left upper lobe pulmonary nodule anteriorly on image number 30/0 is hypermetabolic with SUV max of 76.22. 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 10 mm AP window node on image number 63/3 is hypermetabolic with SUV max of 3.54. No other hypermetabolic mediastinal or hilar lymph nodes. There are several small pleural nodules which are hypermetabolic and consistent with pleural metastases. These are difficult to see on the noncontrast CT scan. SUV max ranges 4.4-3.7. Associated malignant left pleural effusion. No chest wall mass, supraclavicular or axillary adenopathy. Incidental CT findings: Moderate aortic and coronary artery calcifications. No pulmonary nodules to suggest pulmonary metastatic disease. ABDOMEN/PELVIS: No abnormal hypermetabolic activity within the liver, pancreas, adrenal glands, or spleen. No hypermetabolic lymph nodes in the abdomen or pelvis. Incidental CT findings: Scattered aortic calcifications but no aneurysm. Sigmoid colon diverticulosis. Mild prostate gland enlargement. SKELETON: No findings suspicious for osseous metastatic disease. Incidental CT findings: none IMPRESSION: 14 mm left upper lobe pulmonary nodule is hypermetabolic and consistent primary lung neoplasm. Metastatic left AP window node. 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 No abdominal/pelvic or osseous metastatic disease. Electronically Signed   By: Marijo Sanes M.D.   On: 08/03/2020 15:20       IMPRESSION/PLAN: 1. 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  Dr. [name] discusses the findings from his imaging and reviews his work-up and treatment thus far.  He discusses with the patient  the limited disease burden in the chest may be course for considering stereotactic body radiotherapy depending on his response to systemic therapy.  Dr. [name] discusses that at this time his respiratory complaints do not appear to warrant palliative therapy to the chest however would consider stereotactic radiosurgery.  We discussed the rationale for the style of therapy for definitive intent with treatment and the need for further imaging to make sure that he is still a candidate for this.  We will plan on 3T  MRI scan, stimulation, referral to neurosurgery and subsequent treatment in a single fraction.  We discussed the risks, benefits, short and long-term effects of therapy.  The patient is interested in moving  forward. Written consent is obtained and placed in the chart, a copy was provided to the patient.  Special procedures navigator is working closely with the patient and met him today as well to coordinate neck steps for simulation also.  In a visit lasting 60 minutes, greater than 50% of the time was spent face to face discussing the patient's condition, in preparation for the discussion, and coordinating the patient's care.    The above documentation reflects my direct findings during this shared patient visit. Please see the separate note by Dr. [name] on this date for the remainder of the patient's plan of care.    Carola Rhine, [facility]   **Disclaimer: This note was dictated with voice recognition software. Similar sounding words can inadvertently be transcribed and this note may contain transcription errors which may not have been corrected upon publication of note.**

## 2020-08-27 ENCOUNTER — Other Ambulatory Visit: Payer: Self-pay

## 2020-08-27 ENCOUNTER — Inpatient Hospital Stay: Payer: Medicare HMO

## 2020-08-27 ENCOUNTER — Other Ambulatory Visit: Payer: Medicare HMO

## 2020-08-27 DIAGNOSIS — Z5112 Encounter for antineoplastic immunotherapy: Secondary | ICD-10-CM | POA: Diagnosis not present

## 2020-08-27 DIAGNOSIS — C3492 Malignant neoplasm of unspecified part of left bronchus or lung: Secondary | ICD-10-CM

## 2020-08-27 LAB — CBC WITH DIFFERENTIAL (CANCER CENTER ONLY)
Abs Immature Granulocytes: 0.02 10*3/uL (ref 0.00–0.07)
Basophils Absolute: 0 10*3/uL (ref 0.0–0.1)
Basophils Relative: 1 %
Eosinophils Absolute: 0.2 10*3/uL (ref 0.0–0.5)
Eosinophils Relative: 5 %
HCT: 35 % — ABNORMAL LOW (ref 39.0–52.0)
Hemoglobin: 12.3 g/dL — ABNORMAL LOW (ref 13.0–17.0)
Immature Granulocytes: 1 %
Lymphocytes Relative: 37 %
Lymphs Abs: 1.5 10*3/uL (ref 0.7–4.0)
MCH: 31.2 pg (ref 26.0–34.0)
MCHC: 35.1 g/dL (ref 30.0–36.0)
MCV: 88.8 fL (ref 80.0–100.0)
Monocytes Absolute: 0.8 10*3/uL (ref 0.1–1.0)
Monocytes Relative: 21 %
Neutro Abs: 1.4 10*3/uL — ABNORMAL LOW (ref 1.7–7.7)
Neutrophils Relative %: 35 %
Platelet Count: 144 10*3/uL — ABNORMAL LOW (ref 150–400)
RBC: 3.94 MIL/uL — ABNORMAL LOW (ref 4.22–5.81)
RDW: 12.5 % (ref 11.5–15.5)
WBC Count: 3.9 10*3/uL — ABNORMAL LOW (ref 4.0–10.5)
nRBC: 0 % (ref 0.0–0.2)

## 2020-08-27 LAB — CMP (CANCER CENTER ONLY)
ALT: 16 U/L (ref 0–44)
AST: 20 U/L (ref 15–41)
Albumin: 3.7 g/dL (ref 3.5–5.0)
Alkaline Phosphatase: 94 U/L (ref 38–126)
Anion gap: 9 (ref 5–15)
BUN: 12 mg/dL (ref 8–23)
CO2: 28 mmol/L (ref 22–32)
Calcium: 9.5 mg/dL (ref 8.9–10.3)
Chloride: 104 mmol/L (ref 98–111)
Creatinine: 1.1 mg/dL (ref 0.61–1.24)
GFR, Estimated: >60 mL/min (ref >60)
Glucose, Bld: 111 mg/dL — ABNORMAL HIGH (ref 70–99)
Potassium: 4.4 mmol/L (ref 3.5–5.1)
Sodium: 141 mmol/L (ref 135–145)
Total Bilirubin: 0.3 mg/dL (ref 0.3–1.2)
Total Protein: 6.5 g/dL (ref 6.5–8.1)

## 2020-08-27 NOTE — Progress Notes (Signed)
Has armband been applied?  Yes  Does patient have an allergy to IV contrast dye?: No   Has patient ever received premedication for IV contrast dye?: n/a   Does patient take metformin?: No  If patient does take metformin when was the last dose: n/a  Date of lab work: 08/21/2020 BUN: 14 CR: 1.01 eGfr: >60  IV site: LAC  Has IV site been added to flowsheet?  Yes  -Pt will discharge with IV in place for MRI scan at [place] following this appointment.  BP 124/77   Pulse 79   Temp (!) 96.9 F (36.1 C)   Resp 18   SpO2 99%    Vinny Taranto M. Leonie Green, BSN

## 2020-08-28 ENCOUNTER — Encounter: Payer: Self-pay | Admitting: Internal Medicine

## 2020-08-28 ENCOUNTER — Ambulatory Visit
Admission: RE | Admit: 2020-08-28 | Discharge: 2020-08-28 | Disposition: A | Discharge status: Home / Self Care | Payer: Medicare HMO | Source: Ambulatory Visit | Attending: Radiation Oncology | Admitting: Radiation Oncology

## 2020-08-28 VITALS — BP 124/77 | HR 79 | Temp 96.9°F | Resp 18

## 2020-08-28 DIAGNOSIS — C7931 Secondary malignant neoplasm of brain: Secondary | ICD-10-CM | POA: Diagnosis not present

## 2020-08-28 DIAGNOSIS — Z510 Encounter for antineoplastic radiation therapy: Secondary | ICD-10-CM | POA: Insufficient documentation

## 2020-08-28 DIAGNOSIS — C3492 Malignant neoplasm of unspecified part of left bronchus or lung: Secondary | ICD-10-CM | POA: Insufficient documentation

## 2020-08-28 IMAGING — MR MR HEAD WO/W CM
11 series · 48 of 48 positions shown · IV contrast (16ML MULTI)
Comparison: Brain MRI [DATE]

CLINICAL DATA: Brain/CNS neoplasm, staging, radiation planning.
History of lung cancer primary

EXAM:
MRI HEAD WITHOUT AND WITH CONTRAST
TECHNIQUE: Multiplanar, multiecho pulse sequences of the brain and surrounding
structures were obtained without and with intravenous contrast.
CONTRAST:  16mL MULTIHANCE GADOBENATE DIMEGLUMINE 529 MG/ML IV SOLN

[Series 3: FLAIR · sagittal · 3.0mm · 0.75mm/px · 3 of 39 slices shown (1 of 2)]
[im 1/39]
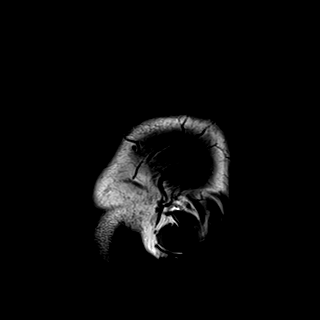
[im 20/39]
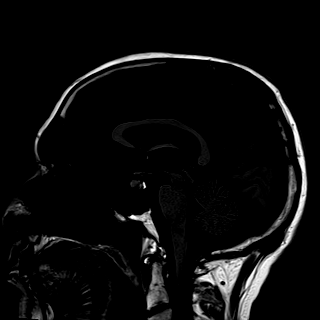
[im 39/39]
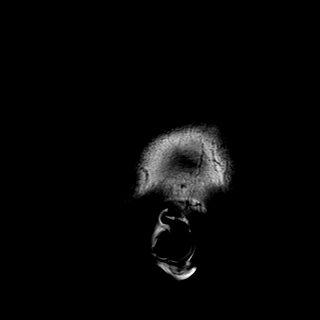

[Series 4: DWI · axial · 3.0mm · 1.50mm/px · z∈[-71,+84]mm · 5 of 82 slices shown (1 of 2)]
[im 1/82]
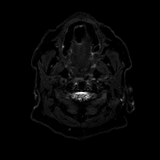
[im 21/82]
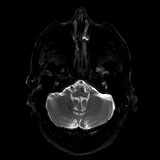
[im 41/82]
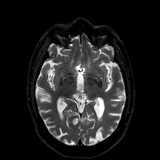
[im 61/82]
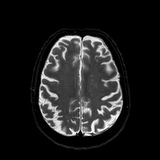
[im 82/82]
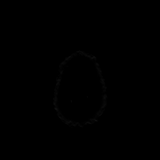

[Series 5: DWI · axial · 3.0mm · 1.50mm/px · z∈[-71,+84]mm · 3 of 41 slices shown (2 of 2)]
[im 1/41]
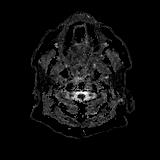
[im 21/41]
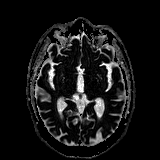
[im 41/41]
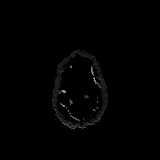

[Series 6: T2 · axial · 5.0mm · 0.57mm/px · 1 of 27 slices shown (1 of 2)]
[im 1/27]
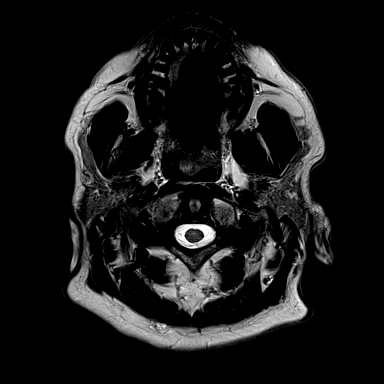

[Series 7: swi_images · axial · 1.5mm · 0.90mm/px · z∈[-64,+78]mm · 5 of 96 slices shown]
[im 1/96]
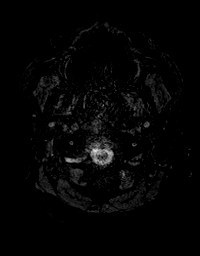
[im 24/96]
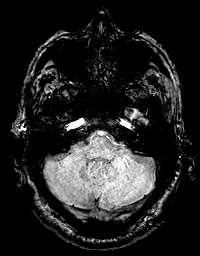
[im 48/96]
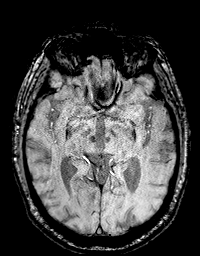
[im 72/96]
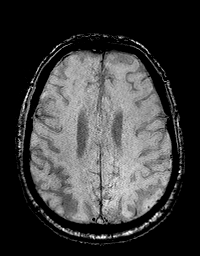
[im 96/96]
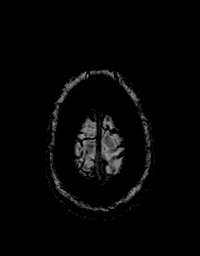

[Series 9: FLAIR · axial · 3.0mm · 0.86mm/px · z∈[-98,+85]mm · 3 of 62 slices shown (2 of 2)]
[im 1/62]
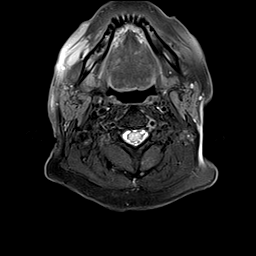
[im 31/62]
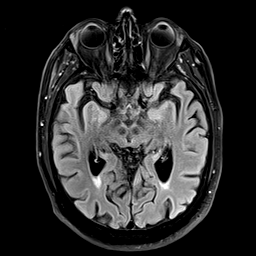
[im 62/62]
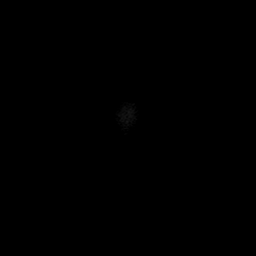

[Series 10: T2 · axial · non-contrast · 1.0mm · 0.86mm/px · z∈[-80,+75]mm · 8 of 160 slices shown (2 of 2)]
[im 1/160]
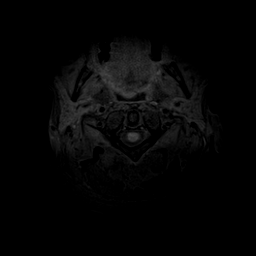
[im 23/160]
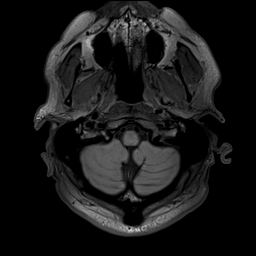
[im 46/160]
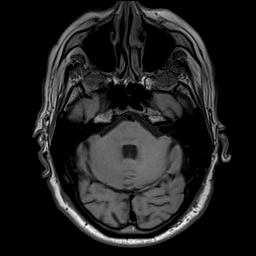
[im 69/160]
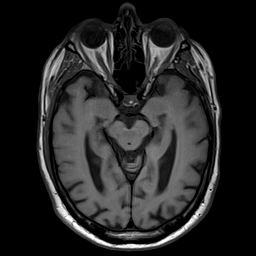
[im 91/160]
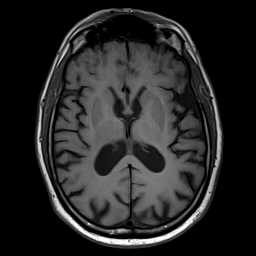
[im 114/160]
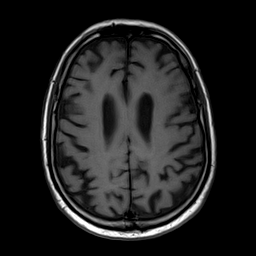
[im 137/160]
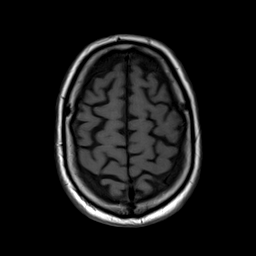
[im 160/160]
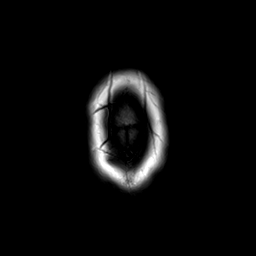

[Series 11: T2 post-contrast · coronal · 3.0mm · 0.57mm/px · 2 of 47 slices shown (1 of 2)]
[im 1/47]
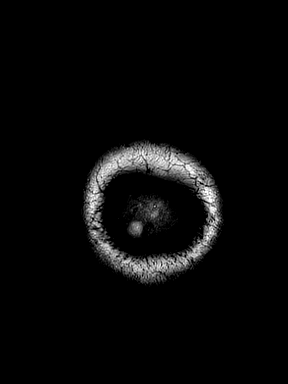
[im 47/47]
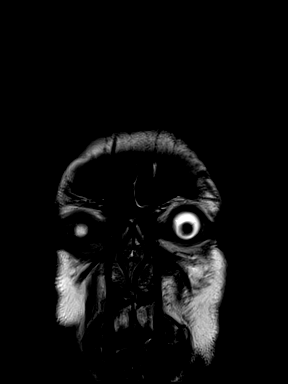

[Series 12: T2 post-contrast · axial · 1.0mm · 0.86mm/px · z∈[-80,+75]mm · 8 of 160 slices shown (2 of 2)]
[im 1/160]
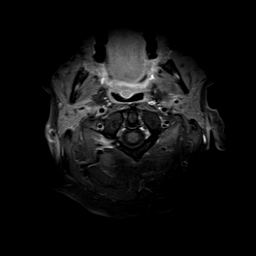
[im 23/160]
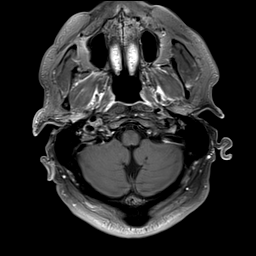
[im 46/160]
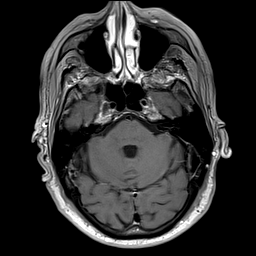
[im 69/160]
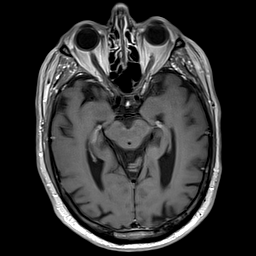
[im 91/160]
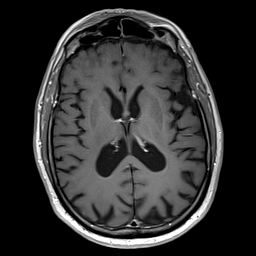
[im 114/160]
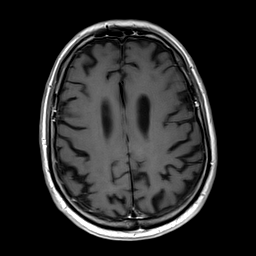
[im 137/160]
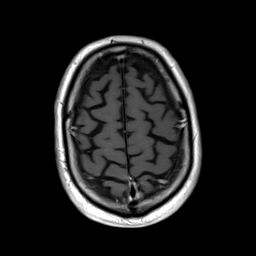
[im 160/160]
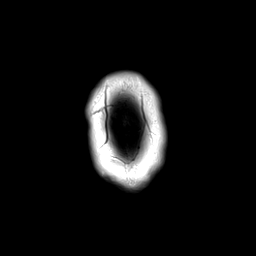

[Series 13: T1 post-contrast · axial · 1.0mm · 0.75mm/px · z∈[-82,+77]mm · 8 of 160 slices shown]
[im 1/160]
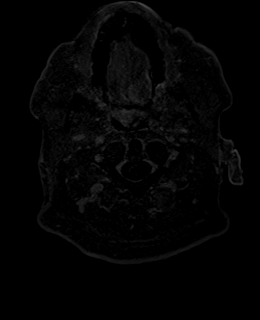
[im 23/160]
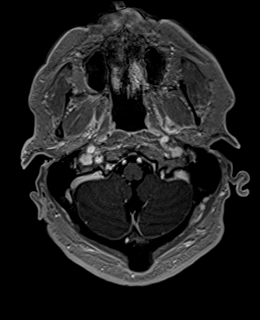
[im 46/160]
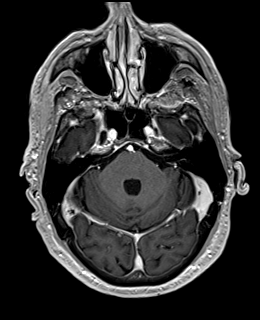
[im 69/160]
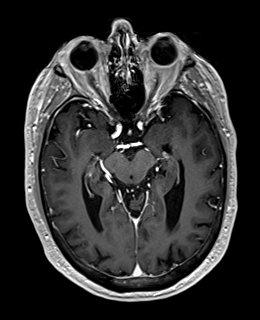
[im 91/160]
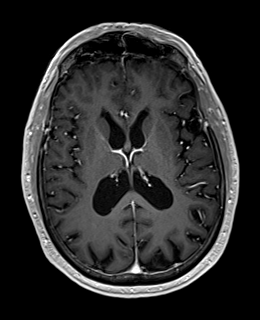
[im 114/160]
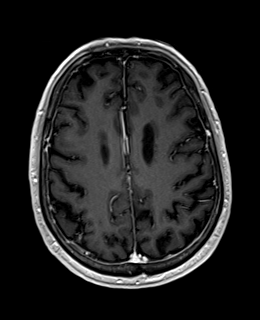
[im 137/160]
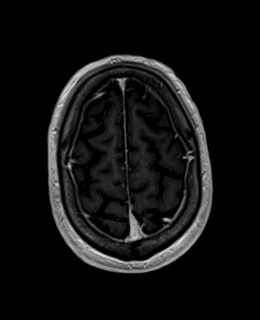
[im 160/160]
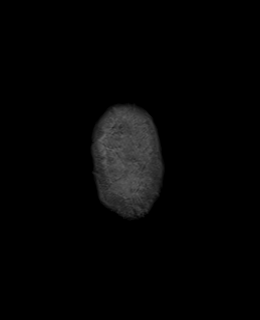

[Series 15: FLAIR post-contrast · sagittal · 3.0mm · 0.75mm/px · 2 of 39 slices shown]
[im 1/39]
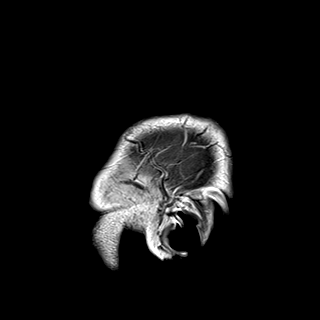
[im 39/39]
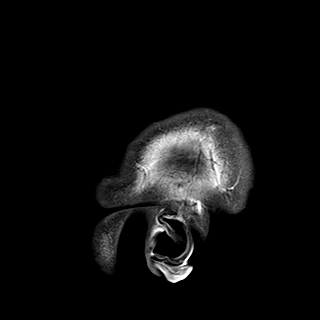

[48 of 48 positions shown; findings below may reference images not displayed]

FINDINGS: Brain: There is a 6 mm AP by 5 mm TV ring-enhancing lesion in the
left temporal lobe, decreased in size from 9 mm by 9 mm. There is no
significant surrounding edema. No intralesional hemorrhage is
identified.

No other enhancing lesions are identified. There is no other
suspicious parenchymal signal abnormality.

The ventricles are stable in size. There is no midline shift. There
is no evidence of acute intracranial hemorrhage or infarct.

Vascular: Normal flow voids.

Skull and upper cervical spine: Normal marrow signal.

Sinuses/Orbits: Negative.

Other: None.
IMPRESSION: 1. Interval decrease in size of a single ring-enhancing metastatic
lesion in the left temporal lobe, now measuring 6 mm x 5 mm.
2. No new lesions identified.

## 2020-08-28 MED ORDER — SODIUM CHLORIDE 0.9% FLUSH
10.0000 mL | Freq: Once | INTRAVENOUS | Status: AC
  Administered 2020-08-28: 10 mL via INTRAVENOUS

## 2020-08-28 MED ORDER — GADOBENATE DIMEGLUMINE 529 MG/ML IV SOLN
16.0000 mL | Freq: Once | INTRAVENOUS | Status: AC | PRN
  Administered 2020-08-28: 16 mL via INTRAVENOUS

## 2020-08-29 ENCOUNTER — Other Ambulatory Visit: Payer: Self-pay | Admitting: Radiation Therapy

## 2020-09-01 ENCOUNTER — Telehealth: Payer: Self-pay | Admitting: Medical Oncology

## 2020-09-01 ENCOUNTER — Inpatient Hospital Stay: Payer: Medicare HMO

## 2020-09-01 DIAGNOSIS — R350 Frequency of micturition: Secondary | ICD-10-CM

## 2020-09-01 NOTE — Telephone Encounter (Signed)
I spoke with the pt and advised as indicated. Pt was also advised if his rash worsens and he finds the hydrocortisone is not helps, he can also take Benadryl which can make him more tired. Pt expressed understanding of this information.

## 2020-09-01 NOTE — Telephone Encounter (Signed)
Urinary frequency  Pt is voiding about every 30 mins to hour HS. He is waking up to go to urinate.  During day it is less frequent  .  He had chills yesterday ,no fever.Denies dysuria , back pain. UA added to labs on Thursday.  New rash x 1 week.New rash "breaking out" on his back and arms . Rash started after his first tx and before CT scan.  He is using hydrocortisone cream on his rash and it  is not really helping.   Mohamed to advise.

## 2020-09-02 DIAGNOSIS — Z510 Encounter for antineoplastic radiation therapy: Secondary | ICD-10-CM | POA: Diagnosis not present

## 2020-09-02 NOTE — Progress Notes (Signed)
[facility] OFFICE PROGRESS NOTE  [name], MD [address] [place] [address]  DIAGNOSIS: Stage IV (T1b, N2, M1 a) non-small cell lung cancer, adenocarcinoma presented with left upper lobe lung nodule in addition to mediastinal lymphadenopathy and pleural-based metastasis as well as malignant left pleural effusion diagnosed in July 2022.  Molecular Studies: Biomarker Findings Microsatellite status - MS-Stable Tumor Mutational Burden - 4 Muts/Mb Genomic Findings For a complete list of the genes assayed, please refer to the Appendix. YTR1NB V670L PTEN splice site [id number] - subclonal? DOT1L S911L - subclonal? RAD21 S271* RB1 loss exons 3-23 TP53 N358f*34 8 Disease relevant genes with no reportable alterations: ALK, BRAF, EGFR, ERBB2, KRAS, MET, RET, ROS1  PRIOR THERAPY: SRS to the metastatic brain lesion under the care of Dr. MLisbeth Renshaw Completed on 09/03/20.   CURRENT THERAPY: Systemic chemotherapy with carboplatin for AUC of 5, Alimta 500 Mg/M2 and Keytruda 200 Mg IV every 3 weeks.  Status post 1 cycle.  First dose on 08/13/20.   INTERVAL HISTORY: JNahome Bublitz80y.o. male returns to the clinic today for a follow-up visit accompanied by his wife.  The patient is feeling fairly today without any concerning complaints except for a rash that started on his back two weeks ago. He also has some of the rash scattered on his chest and arms. He has been using lotion and some hydrocortisone cream. He notes some associated itching with the rash.    The patient recently had an [place] the metastatic brain lesion on 09/03/2020 under the care with Dr. MLisbeth Renshaw  The patient tolerated this well without any concerning adverse side effects.    The patient is undergoing systemic chemotherapy and immunotherapy.  He has some constipation following the first cycle of treatment. He has been taking colace and prunes. He also will take a laxative if needed as well. Last night he took milk  of magnesia. He also drinks 50-60 ounces of water daily. He also had some mouth sores after his first cycle of treatment. He notes the sores were on the roof of his mouth as well as his lip. Of note, he does have a history of cold sores. He has been using salt water rinses and biotene as well as OTC topical numbing agents.   He had a day a week or so ago where he was urinating frequently. He denies pain, foul odor, hematuria, discharge, or back pain. His symptoms resolved without intervention. He felt cold for 30 minutes during this episode as well. He denies any fever, night sweats, or unexplained weight loss. He denies significant shortness of breath or cough as he walks 1-2 miles daily but sometimes feels like his breathing is more "restricted". He has some mild cough in the AM secondary to post-nasal drainage. He has been using Claritin for about 3 weeks or so for seasonal allergies.   Denies any chest pain or hemoptysis.  He denies any nausea, vomiting, or diarrhea.  The patient is here today for evaluation and repeat blood work before starting cycle #2.    MEDICAL HISTORY: Past Medical History:  Diagnosis Date   Lung cancer ([facility]    Lung cancer metastatic to brain ([facility]     ALLERGIES:  is allergic to venlafaxine.  MEDICATIONS:  Current Outpatient Medications  Medication Sig Dispense Refill   aspirin 81 MG EC tablet Take by mouth.     Bacillus Coagulans-Inulin (PROBIOTIC) 1-250 BILLION-MG CAPS Take 1 tablet by mouth daily.  coal tar-salicylic acid 2 % shampoo Apply topically daily as needed for itching.     DULoxetine (CYMBALTA) 30 MG capsule Take 30 mg by mouth daily.     fluocinonide cream (LIDEX) 4.25 % Apply 1 application topically 2 (two) times daily.     folic acid (FOLVITE) 1 MG tablet Take 1 tablet (1 mg total) by mouth daily. 30 tablet 4   Glucosamine-Chondroitin 250-200 MG TABS Take 1 tablet by mouth daily.     hydrocortisone cream 1 % Apply 1 application topically 2  (two) times daily. 453.6 g 0   ketoconazole (NIZORAL) 2 % cream Apply 1 application topically daily.     Krill Oil (OMEGA-3) 500 MG CAPS Take by mouth.     loratadine (CLARITIN) 10 MG tablet Take by mouth.     Magnesium 250 MG TABS Take by mouth.     omeprazole (PRILOSEC) 40 MG capsule Take 40 mg by mouth daily.     OVER THE COUNTER MEDICATION PNEUMOTROPHIN PMG     prochlorperazine (COMPAZINE) 10 MG tablet TAKE 1 TABLET(10 MG) BY MOUTH EVERY 6 HOURS AS NEEDED FOR NAUSEA OR VOMITING 30 tablet 0   No current facility-administered medications for this visit.    SURGICAL HISTORY:  Past Surgical History:  Procedure Laterality Date   KNEE SURGERY     orthoscopic     TONSILLECTOMY AND ADENOIDECTOMY      REVIEW OF SYSTEMS:   Review of Systems  Constitutional: Negative for appetite change, chills, fatigue, fever and unexpected weight change.  HENT: Positive for mouth sores (improving). Negative for nosebleeds, sore throat and trouble swallowing.   Eyes: Negative for eye problems and icterus.  Respiratory: Positive for mild dyspnea on exertion and mild cough in AM with nasal drainage. Negative for hemoptysis and wheezing.   Cardiovascular: Negative for chest pain and leg swelling.  Gastrointestinal: Positive for constipation. Negative for abdominal pain, diarrhea, nausea and vomiting.  Genitourinary: Negative for bladder incontinence, difficulty urinating, dysuria, frequency (resolved) and hematuria.   Musculoskeletal: Negative for back pain, gait problem, neck pain and neck stiffness.  Skin: Positive for rash and itching.  Neurological: Negative for dizziness, extremity weakness, gait problem, headaches, light-headedness and seizures.  Hematological: Negative for adenopathy. Does not bruise/bleed easily.  Psychiatric/Behavioral: Negative for confusion, depression and sleep disturbance. The patient is not nervous/anxious.     PHYSICAL EXAMINATION:  Blood pressure (!) 144/80, pulse 79,  temperature (!) 97.3 F (36.3 C), temperature source Tympanic, resp. rate 18, height '6\' 1"'  (1.854 m), weight 183 lb (83 kg), SpO2 98 %.  ECOG PERFORMANCE STATUS: 1  Physical Exam  Constitutional: Oriented to person, place, and time and well-developed, well-nourished, and in no distress.  HENT:  Head: Normocephalic and atraumatic.  Mouth/Throat: Oropharynx is clear and moist. No oropharyngeal exudate. No thrush. Two small sores on roof of mouth.  Eyes: Conjunctivae are normal. Right eye exhibits no discharge. Left eye exhibits no discharge. No scleral icterus.  Neck: Normal range of motion. Neck supple.  Cardiovascular: Normal rate, regular rhythm, normal heart sounds and intact distal pulses.   Pulmonary/Chest: Effort normal and breath sounds normal. No respiratory distress. No wheezes. No rales.  Abdominal: Soft. Bowel sounds are normal. Exhibits no distension and no mass. There is no tenderness.  Musculoskeletal: Normal range of motion. Exhibits no edema.  Lymphadenopathy:    No cervical adenopathy.  Neurological: Alert and oriented to person, place, and time. Exhibits normal muscle tone. Gait normal. Coordination normal.  Skin: Dry appearing  maculopapular rash on back and scattered lesions on chest and arms. Skin is warm and dry. Not diaphoretic. No erythema. No pallor.  Psychiatric: Mood, memory and judgment normal.  Vitals reviewed.  LABORATORY DATA: Lab Results  Component Value Date   WBC 4.6 09/04/2020   HGB 12.7 (L) 09/04/2020   HCT 36.9 (L) 09/04/2020   MCV 89.8 09/04/2020   PLT 204 09/04/2020      Chemistry      Component Value Date/Time   NA 141 09/04/2020 1011   K 4.7 09/04/2020 1011   CL 106 09/04/2020 1011   CO2 23 09/04/2020 1011   BUN 14 09/04/2020 1011   CREATININE 1.18 09/04/2020 1011      Component Value Date/Time   CALCIUM 9.7 09/04/2020 1011   ALKPHOS 94 09/04/2020 1011   AST 18 09/04/2020 1011   ALT 13 09/04/2020 1011   BILITOT 0.3 09/04/2020 1011        RADIOGRAPHIC STUDIES:  MR Brain W Wo Contrast  Result Date: 08/29/2020 CLINICAL DATA:  Brain/CNS neoplasm, staging, radiation planning. History of lung cancer primary EXAM: MRI HEAD WITHOUT AND WITH CONTRAST TECHNIQUE: Multiplanar, multiecho pulse sequences of the brain and surrounding structures were obtained without and with intravenous contrast. CONTRAST:  76m MULTIHANCE GADOBENATE DIMEGLUMINE 529 MG/ML IV SOLN COMPARISON:  Brain MRI 08/13/2020 FINDINGS: Brain: There is a 6 mm AP by 5 mm TV ring-enhancing lesion in the left temporal lobe, decreased in size from 9 mm by 9 mm. There is no significant surrounding edema. No intralesional hemorrhage is identified. No other enhancing lesions are identified. There is no other suspicious parenchymal signal abnormality. The ventricles are stable in size. There is no midline shift. There is no evidence of acute intracranial hemorrhage or infarct. Vascular: Normal flow voids. Skull and upper cervical spine: Normal marrow signal. Sinuses/Orbits: Negative. Other: None. IMPRESSION: 1. Interval decrease in size of a single ring-enhancing metastatic lesion in the left temporal lobe, now measuring 6 mm x 5 mm. 2. No new lesions identified. Electronically Signed   By: PValetta MoleM.D.   On: 08/29/2020 09:07   MR BRAIN W WO CONTRAST  Result Date: 08/13/2020 CLINICAL DATA:  Non-small cell lung cancer staging EXAM: MRI HEAD WITHOUT AND WITH CONTRAST TECHNIQUE: Multiplanar, multiecho pulse sequences of the brain and surrounding structures were obtained without and with intravenous contrast. CONTRAST:  84mGADAVIST GADOBUTROL 1 MMOL/ML IV SOLN COMPARISON:  None. FINDINGS: Brain: Solitary ring-enhancing lesion in the left temporal lobe, adjacent to the otherwise normal temporal horn of the left lateral ventricle. The lesion measures 10 mm in diameter. No associated brain edema or mass effect. No incidental infarct, hemorrhage, hydrocephalus, or collection. Vascular:  Normal flow voids and vascular enhancement Skull and upper cervical spine: Normal marrow signal Sinuses/Orbits: Negative Other: These results will be called to the ordering clinician or representative by the Radiologist Assistant, and communication documented in the PACS or ClFrontier Oil CorporationIMPRESSION: Solitary ring-enhancing metastasis measuring 1 cm in the left temporal lobe. Electronically Signed   By: JoMonte Fantasia.D.   On: 08/13/2020 21:03     ASSESSMENT/PLAN:  Is a very pleasant [age]ear old Caucasian male diagnosed with stage IV (T1b, N2, M1A) non-small cell lung cancer, adenocarcinoma.  The patient presented with a left upper lobe lung nodule in addition to mediastinal lymphadenopathy and pleural-based metastasis as well as a malignant left pleural effusion.  The patient was diagnosed in July 2022.  The patient also had a metastatic brain lesion.  The  patient's molecular studies by foundation 1 were negative for any actionable mutations.  The patient completed SRS to the brain lesion on 09/03/2020 under the care of Dr. [name].  The patient is currently undergoing systemic chemotherapy with carboplatin for an AUC of 5, Alimta 500 mg per metered squared, Keytruda 200 mg IV every 3 weeks.  The patient is status post 1 cycle.  The patient was seen with Dr. Julien Nordmann today.  Labs reviewed.  Recommend that he proceed with cycle #2 today scheduled. Dr. Julien Nordmann also feels that the patient's rash is related to a drug rash, likely immunotherapy mediated. However, he would like him to proceed with treatment today as planned and try symptomatic relief. I will send a prescription for hydrocortisone cream. He was also advised to use an anti-histamine for itching if needed. Recommend benadryl but cautioned this may make him drowsy and to avoid driving while taking this. If he develops new or worsening symptoms, he was advised to call us. Dr. Julien Nordmann does not want to send in oral steroids at this time due to  impacting the effectiveness of his immunotherapy.   For the mouth sores, they are improving at this time. His reported lip lesion has resolved. He will continue to use biotene and salt water rinses. If recurrent symptoms, he was advised to call. He has a history of cold sores and it is likely he had a flare up of this.   We will see him back for follow-up visit in 3 weeks for evaluation before starting cycle #3.  The patient will continue to take a stool softener daily to prevent constipation. Advised to use a laxative if no bowel movement in two or more days than his normal bowel movement frequency. He will continue to stay hydrated.   A UA was checked today due to the one day worth of increased urinary frequency. His UA did not show signs of infection or abnormalities.   The patient was advised to call immediately if he has any concerning symptoms in the interval. The patient voices understanding of current disease status and treatment options and is in agreement with the current care plan. All questions were answered. The patient knows to call the clinic with any problems, questions or concerns. We can certainly see the patient much sooner if necessary  No orders of the defined types were placed in this encounter.     Laritza Vokes L Jilberto Vanderwall, PA-C 09/04/20  ADDENDUM: Hematology/Oncology Attending: I had a face-to-face encounter with the patient today.  I reviewed his records, lab and recommended his care plan.  This is a very pleasant 80 years old white male diagnosed with stage IV (T1b, N2, M1 b) non-small cell lung cancer, adenocarcinoma presented with left upper lobe lung nodule in addition to mediastinal lymphadenopathy and pleural-based metastasis as well as malignant left pleural effusion diagnosed in July 2022 with no actionable mutation and solitary brain metastasis status post SRS. The patient is currently undergoing systemic chemotherapy with carboplatin, Alimta and Keytruda  status post 1 cycle.  He tolerated the first cycle of his treatment well except for mild skin rash on the back. I recommended for him to proceed with cycle #2 today as planned. The rash is likely secondary to his current treatment with Alimta or Keytruda.  We will give the patient prescription for hydrocortisone cream 1% to apply to the skin rash area.  If he continues to have significant worsening of his rash, we may consider him for treatment with oral steroids.  He will come back for follow-up visit in 3 weeks for evaluation before the next cycle of his treatment. The patient was advised to call immediately if he has any concerning symptoms in the interval. Discl Eilleen Kempf, MD 09/04/20

## 2020-09-03 ENCOUNTER — Ambulatory Visit
Admission: RE | Admit: 2020-09-03 | Discharge: 2020-09-03 | Disposition: A | Discharge status: Home / Self Care | Payer: Medicare HMO | Source: Ambulatory Visit | Attending: Radiation Oncology | Admitting: Radiation Oncology

## 2020-09-03 ENCOUNTER — Encounter: Payer: Self-pay | Admitting: Radiation Oncology

## 2020-09-03 ENCOUNTER — Other Ambulatory Visit: Payer: Self-pay

## 2020-09-03 DIAGNOSIS — Z510 Encounter for antineoplastic radiation therapy: Secondary | ICD-10-CM | POA: Diagnosis not present

## 2020-09-03 NOTE — Progress Notes (Addendum)
Mr. Peter Brown rested with [name] for 30 minutes following his SRS treatment.  Patient denies headache, dizziness, nausea, diplopia or ringing in the ears. Denies fatigue. Patient without complaints. Understands to avoid strenuous activity for the next 24 hours and call [phone] with needs. Patient ambulated out of the clinic without difficulty with his wife.  BP 125/81   Pulse 70   Temp 98.6 F (37 C)   Resp 16   SpO2 99%    LaToya M. Leonie Green, BSN

## 2020-09-03 NOTE — Op Note (Signed)
  Name: Peter Brown  MRN: [id number]  Date: 09/03/2020   DOB: 04-17-1940  Stereotactic Radiosurgery Operative Note  PRE-OPERATIVE DIAGNOSIS:  Solitary Brain Metastasis  POST-OPERATIVE DIAGNOSIS:  Same  PROCEDURE:  Stereotactic Radiosurgery  SURGEON:  Judith Part, MD  NARRATIVE: The patient underwent a radiation treatment planning session in the radiation oncology simulation suite under the care of the radiation oncology physician and physicist.  I participated closely in the radiation treatment planning afterwards. The patient underwent planning CT which was fused to 3T high resolution MRI with 1 mm axial slices.  These images were fused on the planning system.  We contoured the gross target volumes and subsequently expanded this to yield the Planning Target Volume. I actively participated in the planning process.  I helped to define and review the target contours and also the contours of the optic pathway, eyes, brainstem and selected nearby organs at risk.  All the dose constraints for critical structures were reviewed and compared to AAPM Task Group 101.  The prescription dose conformity was reviewed.  I approved the plan electronically.    Accordingly, Oletta Darter was brought to the TrueBeam stereotactic radiation treatment linac and placed in the custom immobilization mask.  The patient was aligned according to the IR fiducial markers with BrainLab Exactrac, then orthogonal x-rays were used in ExacTrac with the 6DOF robotic table and the shifts were made to align the patient  Oletta Darter received stereotactic radiosurgery uneventfully.    Lesions treated:  1   Complex lesions treated:  0 (>3.5 cm, <69mm of optic path, or within the brainstem)   The detailed description of the procedure is recorded in the radiation oncology procedure note.  I was present for the duration of the procedure.  DISPOSITION:  Following delivery, the patient was transported to nursing in stable condition  and monitored for possible acute effects to be discharged to home in stable condition with follow-up in one month.  Judith Part, MD 09/03/2020 4:58 PM

## 2020-09-04 ENCOUNTER — Inpatient Hospital Stay: Payer: Medicare HMO | Attending: Internal Medicine | Admitting: Physician Assistant

## 2020-09-04 ENCOUNTER — Encounter: Payer: Self-pay | Admitting: Physician Assistant

## 2020-09-04 ENCOUNTER — Inpatient Hospital Stay: Payer: Medicare HMO

## 2020-09-04 ENCOUNTER — Ambulatory Visit: Payer: Medicare HMO | Admitting: Radiation Oncology

## 2020-09-04 ENCOUNTER — Telehealth: Payer: Self-pay | Admitting: Physician Assistant

## 2020-09-04 VITALS — BP 144/80 | HR 79 | Temp 97.3°F | Resp 18 | Ht 73.0 in | Wt 183.0 lb

## 2020-09-04 DIAGNOSIS — J910 Malignant pleural effusion: Secondary | ICD-10-CM | POA: Insufficient documentation

## 2020-09-04 DIAGNOSIS — Z5111 Encounter for antineoplastic chemotherapy: Secondary | ICD-10-CM | POA: Diagnosis present

## 2020-09-04 DIAGNOSIS — C3492 Malignant neoplasm of unspecified part of left bronchus or lung: Secondary | ICD-10-CM

## 2020-09-04 DIAGNOSIS — K1379 Other lesions of oral mucosa: Secondary | ICD-10-CM

## 2020-09-04 DIAGNOSIS — C782 Secondary malignant neoplasm of pleura: Secondary | ICD-10-CM | POA: Insufficient documentation

## 2020-09-04 DIAGNOSIS — C7931 Secondary malignant neoplasm of brain: Secondary | ICD-10-CM | POA: Diagnosis not present

## 2020-09-04 DIAGNOSIS — L270 Generalized skin eruption due to drugs and medicaments taken internally: Secondary | ICD-10-CM | POA: Diagnosis not present

## 2020-09-04 DIAGNOSIS — Z79899 Other long term (current) drug therapy: Secondary | ICD-10-CM | POA: Diagnosis not present

## 2020-09-04 DIAGNOSIS — Z5112 Encounter for antineoplastic immunotherapy: Secondary | ICD-10-CM | POA: Diagnosis not present

## 2020-09-04 DIAGNOSIS — R350 Frequency of micturition: Secondary | ICD-10-CM

## 2020-09-04 DIAGNOSIS — C3412 Malignant neoplasm of upper lobe, left bronchus or lung: Secondary | ICD-10-CM | POA: Insufficient documentation

## 2020-09-04 LAB — CBC WITH DIFFERENTIAL (CANCER CENTER ONLY)
Abs Immature Granulocytes: 0.05 10*3/uL (ref 0.00–0.07)
Basophils Absolute: 0 10*3/uL (ref 0.0–0.1)
Basophils Relative: 0 %
Eosinophils Absolute: 0.2 10*3/uL (ref 0.0–0.5)
Eosinophils Relative: 4 %
HCT: 36.9 % — ABNORMAL LOW (ref 39.0–52.0)
Hemoglobin: 12.7 g/dL — ABNORMAL LOW (ref 13.0–17.0)
Immature Granulocytes: 1 %
Lymphocytes Relative: 36 %
Lymphs Abs: 1.6 10*3/uL (ref 0.7–4.0)
MCH: 30.9 pg (ref 26.0–34.0)
MCHC: 34.4 g/dL (ref 30.0–36.0)
MCV: 89.8 fL (ref 80.0–100.0)
Monocytes Absolute: 0.7 10*3/uL (ref 0.1–1.0)
Monocytes Relative: 15 %
Neutro Abs: 2 10*3/uL (ref 1.7–7.7)
Neutrophils Relative %: 44 %
Platelet Count: 204 10*3/uL (ref 150–400)
RBC: 4.11 MIL/uL — ABNORMAL LOW (ref 4.22–5.81)
RDW: 13.1 % (ref 11.5–15.5)
WBC Count: 4.6 10*3/uL (ref 4.0–10.5)
nRBC: 0 % (ref 0.0–0.2)

## 2020-09-04 LAB — URINALYSIS, COMPLETE (UACMP) WITH MICROSCOPIC
Bacteria, UA: NONE SEEN
Bilirubin Urine: NEGATIVE
Glucose, UA: NEGATIVE mg/dL
Hgb urine dipstick: NEGATIVE
Ketones, ur: NEGATIVE mg/dL
Leukocytes,Ua: NEGATIVE
Nitrite: NEGATIVE
Protein, ur: NEGATIVE mg/dL
Specific Gravity, Urine: 1.01 (ref 1.005–1.030)
pH: 7.5 (ref 5.0–8.0)

## 2020-09-04 LAB — CMP (CANCER CENTER ONLY)
ALT: 13 U/L (ref 0–44)
AST: 18 U/L (ref 15–41)
Albumin: 3.8 g/dL (ref 3.5–5.0)
Alkaline Phosphatase: 94 U/L (ref 38–126)
Anion gap: 12 (ref 5–15)
BUN: 14 mg/dL (ref 8–23)
CO2: 23 mmol/L (ref 22–32)
Calcium: 9.7 mg/dL (ref 8.9–10.3)
Chloride: 106 mmol/L (ref 98–111)
Creatinine: 1.18 mg/dL (ref 0.61–1.24)
GFR, Estimated: >60 mL/min (ref >60)
Glucose, Bld: 134 mg/dL — ABNORMAL HIGH (ref 70–99)
Potassium: 4.7 mmol/L (ref 3.5–5.1)
Sodium: 141 mmol/L (ref 135–145)
Total Bilirubin: 0.3 mg/dL (ref 0.3–1.2)
Total Protein: 6.8 g/dL (ref 6.5–8.1)

## 2020-09-04 LAB — TSH: TSH: 3.8 u[IU]/mL (ref 0.320–4.118)

## 2020-09-04 MED ORDER — SODIUM CHLORIDE 0.9 % IV SOLN
10.0000 mg | Freq: Once | INTRAVENOUS | Status: AC
  Administered 2020-09-04: 10 mg via INTRAVENOUS
  Filled 2020-09-04: qty 10

## 2020-09-04 MED ORDER — MAGIC MOUTHWASH
5.0000 mL | Freq: Three times a day (TID) | ORAL | 0 refills | Status: DC | PRN
Start: 2020-09-04 — End: 2020-09-04

## 2020-09-04 MED ORDER — SODIUM CHLORIDE 0.9 % IV SOLN
200.0000 mg | Freq: Once | INTRAVENOUS | Status: AC
  Administered 2020-09-04: 200 mg via INTRAVENOUS
  Filled 2020-09-04: qty 8

## 2020-09-04 MED ORDER — HYDROCORTISONE 1 % EX CREA: 1.0000 "application " | TOPICAL_CREAM | Freq: Two times a day (BID) | CUTANEOUS | 0 refills | Status: DC

## 2020-09-04 MED ORDER — SODIUM CHLORIDE 0.9 % IV SOLN
150.0000 mg | Freq: Once | INTRAVENOUS | Status: AC
  Administered 2020-09-04: 150 mg via INTRAVENOUS
  Filled 2020-09-04: qty 150

## 2020-09-04 MED ORDER — SODIUM CHLORIDE 0.9 % IV SOLN
416.5000 mg | Freq: Once | INTRAVENOUS | Status: AC
  Administered 2020-09-04: 420 mg via INTRAVENOUS
  Filled 2020-09-04: qty 42

## 2020-09-04 MED ORDER — SODIUM CHLORIDE 0.9 % IV SOLN
500.0000 mg/m2 | Freq: Once | INTRAVENOUS | Status: AC
  Administered 2020-09-04: 1000 mg via INTRAVENOUS
  Filled 2020-09-04: qty 40

## 2020-09-04 MED ORDER — PALONOSETRON HCL INJECTION 0.25 MG/5ML
0.2500 mg | Freq: Once | INTRAVENOUS | Status: AC
  Administered 2020-09-04: 0.25 mg via INTRAVENOUS
  Filled 2020-09-04: qty 5

## 2020-09-04 MED ORDER — SODIUM CHLORIDE 0.9 % IV SOLN: Freq: Once | INTRAVENOUS | Status: AC

## 2020-09-04 NOTE — Telephone Encounter (Signed)
9/1 - NO LOS

## 2020-09-04 NOTE — Patient Instructions (Signed)
[facility]  Discharge Instructions: Thank you for choosing [facility] to provide your oncology and hematology care.   If you have a lab appointment with the [facility], please go directly to the [facility] and check in at the registration area.   Wear comfortable clothing and clothing appropriate for easy access to any Portacath or PICC line.   We strive to give you quality time with your provider. You may need to reschedule your appointment if you arrive late (15 or more minutes).  Arriving late affects you and other patients whose appointments are after yours.  Also, if you miss three or more appointments without notifying the office, you may be dismissed from the clinic at the provider's discretion.      For prescription refill requests, have your pharmacy contact our office and allow 72 hours for refills to be completed.    Today you received the following chemotherapy and/or immunotherapy agents Keytruda, Alimta, and Carboplatin      To help prevent nausea and vomiting after your treatment, we encourage you to take your nausea medication as directed.  BELOW ARE SYMPTOMS THAT SHOULD BE REPORTED IMMEDIATELY: *FEVER GREATER THAN 100.4 F (38 C) OR HIGHER *CHILLS OR SWEATING *NAUSEA AND VOMITING THAT IS NOT CONTROLLED WITH YOUR NAUSEA MEDICATION *UNUSUAL SHORTNESS OF BREATH *UNUSUAL BRUISING OR BLEEDING *URINARY PROBLEMS (pain or burning when urinating, or frequent urination) *BOWEL PROBLEMS (unusual diarrhea, constipation, pain near the anus) TENDERNESS IN MOUTH AND THROAT WITH OR WITHOUT PRESENCE OF ULCERS (sore throat, sores in mouth, or a toothache) UNUSUAL RASH, SWELLING OR PAIN  UNUSUAL VAGINAL DISCHARGE OR ITCHING   Items with * indicate a potential emergency and should be followed up as soon as possible or go to the Emergency Department if any problems should occur.  Please show the CHEMOTHERAPY ALERT CARD or IMMUNOTHERAPY  ALERT CARD at check-in to the Emergency Department and triage nurse.  Should you have questions after your visit or need to cancel or reschedule your appointment, please contact [facility]  Dept: [phone]  and follow the prompts.  Office hours are 8:00 a.m. to 4:30 p.m. Monday - Friday. Please note that voicemails left after 4:00 p.m. may not be returned until the following business day.  We are closed weekends and major holidays. You have access to a nurse at all times for urgent questions. Please call the main number to the clinic Dept: [phone] and follow the prompts.   For any non-urgent questions, you may also contact your provider using MyChart. We now offer e-Visits for anyone 45 and older to request care online for non-urgent symptoms. For details visit [email or web address].   Also download the MyChart app! Go to the app store, search "MyChart", open the app, select [facility], and log in with your MyChart username and password.  Due to Covid, a mask is required upon entering the hospital/clinic. If you do not have a mask, one will be given to you upon arrival. For doctor visits, patients may have 1 support person aged 78 or older with them. For treatment visits, patients cannot have anyone with them due to current Covid guidelines and our immunocompromised population.

## 2020-09-09 NOTE — Progress Notes (Signed)
                                                                                                                                                             Patient Name: Peter Brown MRN: [id number] DOB: 06-01-1940 Referring Physician: Curt Bears (Profile Not Attached) Date of Service: 09/03/2020 [facility], [place]                                                        End Of Treatment Note  Diagnoses: C34.92-Malignant neoplasm of unspecified part of left bronchus or lung C79.31-Secondary malignant neoplasm of brain  Cancer Staging: Stage IV, cT1bN1M1b, NSCLC, adenocarcinoma of the Left Upper Lobe with malignant left pleural effusion and solitary brain metastasis at the time of diagnosis.  Intent: Palliative  Radiation Treatment Dates: 09/03/2020 through 09/03/2020 Site Technique Total Dose (Gy) Dose per Fx (Gy) Completed Fx Beam Energies  Brain: Brain [id number] 3D 20/20 20 1/1 6XFFF   Narrative: The patient tolerated radiation therapy relatively well.   Plan: The patient will receive a call in about one month from the radiation oncology department. He will continue follow up with Dr. Julien Nordmann as well.   ________________________________________________    Carola Rhine, [facility]

## 2020-09-10 ENCOUNTER — Other Ambulatory Visit: Payer: Self-pay

## 2020-09-10 ENCOUNTER — Inpatient Hospital Stay: Payer: Medicare HMO

## 2020-09-10 DIAGNOSIS — Z5112 Encounter for antineoplastic immunotherapy: Secondary | ICD-10-CM | POA: Diagnosis not present

## 2020-09-10 DIAGNOSIS — C3492 Malignant neoplasm of unspecified part of left bronchus or lung: Secondary | ICD-10-CM

## 2020-09-10 LAB — CBC WITH DIFFERENTIAL (CANCER CENTER ONLY)
Abs Immature Granulocytes: 0.02 10*3/uL (ref 0.00–0.07)
Basophils Absolute: 0 10*3/uL (ref 0.0–0.1)
Basophils Relative: 0 %
Eosinophils Absolute: 0.1 10*3/uL (ref 0.0–0.5)
Eosinophils Relative: 2 %
HCT: 34 % — ABNORMAL LOW (ref 39.0–52.0)
Hemoglobin: 11.7 g/dL — ABNORMAL LOW (ref 13.0–17.0)
Immature Granulocytes: 0 %
Lymphocytes Relative: 29 %
Lymphs Abs: 1.5 10*3/uL (ref 0.7–4.0)
MCH: 31.2 pg (ref 26.0–34.0)
MCHC: 34.4 g/dL (ref 30.0–36.0)
MCV: 90.7 fL (ref 80.0–100.0)
Monocytes Absolute: 0.2 10*3/uL (ref 0.1–1.0)
Monocytes Relative: 3 %
Neutro Abs: 3.4 10*3/uL (ref 1.7–7.7)
Neutrophils Relative %: 66 %
Platelet Count: 125 10*3/uL — ABNORMAL LOW (ref 150–400)
RBC: 3.75 MIL/uL — ABNORMAL LOW (ref 4.22–5.81)
RDW: 12.7 % (ref 11.5–15.5)
WBC Count: 5.3 10*3/uL (ref 4.0–10.5)
nRBC: 0 % (ref 0.0–0.2)

## 2020-09-10 LAB — CMP (CANCER CENTER ONLY)
ALT: 24 U/L (ref 0–44)
AST: 24 U/L (ref 15–41)
Albumin: 3.7 g/dL (ref 3.5–5.0)
Alkaline Phosphatase: 96 U/L (ref 38–126)
Anion gap: 11 (ref 5–15)
BUN: 19 mg/dL (ref 8–23)
CO2: 26 mmol/L (ref 22–32)
Calcium: 9.4 mg/dL (ref 8.9–10.3)
Chloride: 102 mmol/L (ref 98–111)
Creatinine: 1.12 mg/dL (ref 0.61–1.24)
GFR, Estimated: >60 mL/min (ref >60)
Glucose, Bld: 93 mg/dL (ref 70–99)
Potassium: 4.7 mmol/L (ref 3.5–5.1)
Sodium: 139 mmol/L (ref 135–145)
Total Bilirubin: 0.6 mg/dL (ref 0.3–1.2)
Total Protein: 6.8 g/dL (ref 6.5–8.1)

## 2020-09-16 ENCOUNTER — Inpatient Hospital Stay: Payer: Medicare HMO

## 2020-09-16 ENCOUNTER — Encounter: Payer: Self-pay | Admitting: Physician Assistant

## 2020-09-16 ENCOUNTER — Other Ambulatory Visit: Payer: Self-pay

## 2020-09-16 ENCOUNTER — Ambulatory Visit (INDEPENDENT_AMBULATORY_CARE_PROVIDER_SITE_OTHER): Payer: Medicare HMO | Admitting: Physician Assistant

## 2020-09-16 DIAGNOSIS — Z5112 Encounter for antineoplastic immunotherapy: Secondary | ICD-10-CM | POA: Diagnosis not present

## 2020-09-16 DIAGNOSIS — L270 Generalized skin eruption due to drugs and medicaments taken internally: Secondary | ICD-10-CM

## 2020-09-16 DIAGNOSIS — C3490 Malignant neoplasm of unspecified part of unspecified bronchus or lung: Secondary | ICD-10-CM | POA: Diagnosis not present

## 2020-09-16 DIAGNOSIS — C3492 Malignant neoplasm of unspecified part of left bronchus or lung: Secondary | ICD-10-CM

## 2020-09-16 DIAGNOSIS — L570 Actinic keratosis: Secondary | ICD-10-CM | POA: Diagnosis not present

## 2020-09-16 LAB — CMP (CANCER CENTER ONLY)
ALT: 20 U/L (ref 0–44)
AST: 23 U/L (ref 15–41)
Albumin: 4 g/dL (ref 3.5–5.0)
Alkaline Phosphatase: 93 U/L (ref 38–126)
Anion gap: 10 (ref 5–15)
BUN: 13 mg/dL (ref 8–23)
CO2: 26 mmol/L (ref 22–32)
Calcium: 9.7 mg/dL (ref 8.9–10.3)
Chloride: 105 mmol/L (ref 98–111)
Creatinine: 1.01 mg/dL (ref 0.61–1.24)
GFR, Estimated: >60 mL/min (ref >60)
Glucose, Bld: 87 mg/dL (ref 70–99)
Potassium: 4.3 mmol/L (ref 3.5–5.1)
Sodium: 141 mmol/L (ref 135–145)
Total Bilirubin: 0.4 mg/dL (ref 0.3–1.2)
Total Protein: 7.2 g/dL (ref 6.5–8.1)

## 2020-09-16 LAB — CBC WITH DIFFERENTIAL (CANCER CENTER ONLY)
Abs Immature Granulocytes: 0.01 10*3/uL (ref 0.00–0.07)
Basophils Absolute: 0 10*3/uL (ref 0.0–0.1)
Basophils Relative: 0 %
Eosinophils Absolute: 0.1 10*3/uL (ref 0.0–0.5)
Eosinophils Relative: 3 %
HCT: 32.5 % — ABNORMAL LOW (ref 39.0–52.0)
Hemoglobin: 11.4 g/dL — ABNORMAL LOW (ref 13.0–17.0)
Immature Granulocytes: 0 %
Lymphocytes Relative: 48 %
Lymphs Abs: 1.4 10*3/uL (ref 0.7–4.0)
MCH: 31.8 pg (ref 26.0–34.0)
MCHC: 35.1 g/dL (ref 30.0–36.0)
MCV: 90.5 fL (ref 80.0–100.0)
Monocytes Absolute: 0.7 10*3/uL (ref 0.1–1.0)
Monocytes Relative: 23 %
Neutro Abs: 0.8 10*3/uL — ABNORMAL LOW (ref 1.7–7.7)
Neutrophils Relative %: 26 %
Platelet Count: 94 10*3/uL — ABNORMAL LOW (ref 150–400)
RBC: 3.59 MIL/uL — ABNORMAL LOW (ref 4.22–5.81)
RDW: 12.7 % (ref 11.5–15.5)
WBC Count: 2.9 10*3/uL — ABNORMAL LOW (ref 4.0–10.5)
nRBC: 0 % (ref 0.0–0.2)

## 2020-09-16 MED ORDER — TRIAMCINOLONE ACETONIDE 0.1 % EX CREA: 1.0000 "application " | TOPICAL_CREAM | Freq: Every day | CUTANEOUS | 6 refills | Status: DC | PRN

## 2020-09-17 ENCOUNTER — Encounter: Payer: Self-pay | Admitting: Internal Medicine

## 2020-09-17 ENCOUNTER — Other Ambulatory Visit: Payer: Medicare HMO

## 2020-09-18 ENCOUNTER — Encounter: Payer: Self-pay | Admitting: Physician Assistant

## 2020-09-18 NOTE — Progress Notes (Signed)
   Follow-Up Visit   Subjective  Peter Brown is a 80 y.o. male who presents for the following: Rash (Back, chest & arms x 2 months- tx hydrocortisone & benadryl undergoing treatment(immunotherapy & Keytruda for stage 4 lung cancer). He has a new onset of a pink and scaly rash on his back and chest. It itches. The rash coincides with his cancer treatment.    The following portions of the chart were reviewed this encounter and updated as appropriate:  Tobacco  Allergies  Meds  Problems  Med Hx  Surg Hx  Fam Hx      Objective  Well appearing patient in no apparent distress; mood and affect are within normal limits.  All skin waist up examined.  Chest - Medial [facility]), Mid Back New patches of scale and erythema. They are pruritic.  Left Malar Cheek Erythematous patches with gritty scale.Erythematous patches with gritty scale.   Assessment & Plan  Drug eruption Chest - Medial [facility]); Mid Back  triamcinolone cream (KENALOG) 0.1 % - Chest - Medial (Center), Mid Back Apply 1 application topically daily as needed.  AK (actinic keratosis) Left Malar Cheek  Destruction of lesion - Left Malar Cheek Complexity: simple   Destruction method: cryotherapy   Informed consent: discussed and consent obtained   Timeout:  patient name, date of birth, surgical site, and procedure verified Lesion destroyed using liquid nitrogen: Yes   Cryotherapy cycles:  1 Outcome: patient tolerated procedure well with no complications   Post-procedure details: wound care instructions given      I, Denman Pichardo, PA-C, have reviewed all documentation's for this visit.  The documentation on 09/18/20 for the exam, diagnosis, procedures and orders are all accurate and complete.

## 2020-09-24 ENCOUNTER — Inpatient Hospital Stay (HOSPITAL_BASED_OUTPATIENT_CLINIC_OR_DEPARTMENT_OTHER): Payer: Medicare HMO | Admitting: Internal Medicine

## 2020-09-24 ENCOUNTER — Other Ambulatory Visit: Payer: Self-pay

## 2020-09-24 ENCOUNTER — Inpatient Hospital Stay: Payer: Medicare HMO

## 2020-09-24 VITALS — BP 134/72 | HR 73 | Temp 97.5°F | Resp 16 | Ht 73.0 in | Wt 180.9 lb

## 2020-09-24 DIAGNOSIS — C3492 Malignant neoplasm of unspecified part of left bronchus or lung: Secondary | ICD-10-CM | POA: Diagnosis not present

## 2020-09-24 DIAGNOSIS — Z5111 Encounter for antineoplastic chemotherapy: Secondary | ICD-10-CM

## 2020-09-24 DIAGNOSIS — Z5112 Encounter for antineoplastic immunotherapy: Secondary | ICD-10-CM

## 2020-09-24 DIAGNOSIS — C7931 Secondary malignant neoplasm of brain: Secondary | ICD-10-CM | POA: Diagnosis not present

## 2020-09-24 DIAGNOSIS — C3490 Malignant neoplasm of unspecified part of unspecified bronchus or lung: Secondary | ICD-10-CM

## 2020-09-24 LAB — TSH: TSH: 2.61 u[IU]/mL (ref 0.320–4.118)

## 2020-09-24 LAB — CBC WITH DIFFERENTIAL (CANCER CENTER ONLY)
Abs Immature Granulocytes: 0.02 10*3/uL (ref 0.00–0.07)
Basophils Absolute: 0 10*3/uL (ref 0.0–0.1)
Basophils Relative: 1 %
Eosinophils Absolute: 0.1 10*3/uL (ref 0.0–0.5)
Eosinophils Relative: 3 %
HCT: 33.7 % — ABNORMAL LOW (ref 39.0–52.0)
Hemoglobin: 11.6 g/dL — ABNORMAL LOW (ref 13.0–17.0)
Immature Granulocytes: 1 %
Lymphocytes Relative: 39 %
Lymphs Abs: 1.4 10*3/uL (ref 0.7–4.0)
MCH: 31.6 pg (ref 26.0–34.0)
MCHC: 34.4 g/dL (ref 30.0–36.0)
MCV: 91.8 fL (ref 80.0–100.0)
Monocytes Absolute: 0.6 10*3/uL (ref 0.1–1.0)
Monocytes Relative: 16 %
Neutro Abs: 1.5 10*3/uL — ABNORMAL LOW (ref 1.7–7.7)
Neutrophils Relative %: 40 %
Platelet Count: 212 10*3/uL (ref 150–400)
RBC: 3.67 MIL/uL — ABNORMAL LOW (ref 4.22–5.81)
RDW: 14.6 % (ref 11.5–15.5)
WBC Count: 3.6 10*3/uL — ABNORMAL LOW (ref 4.0–10.5)
nRBC: 0 % (ref 0.0–0.2)

## 2020-09-24 LAB — CMP (CANCER CENTER ONLY)
ALT: 11 U/L (ref 0–44)
AST: 17 U/L (ref 15–41)
Albumin: 3.7 g/dL (ref 3.5–5.0)
Alkaline Phosphatase: 84 U/L (ref 38–126)
Anion gap: 11 (ref 5–15)
BUN: 12 mg/dL (ref 8–23)
CO2: 26 mmol/L (ref 22–32)
Calcium: 9.6 mg/dL (ref 8.9–10.3)
Chloride: 106 mmol/L (ref 98–111)
Creatinine: 1.05 mg/dL (ref 0.61–1.24)
GFR, Estimated: >60 mL/min (ref >60)
Glucose, Bld: 114 mg/dL — ABNORMAL HIGH (ref 70–99)
Potassium: 4.3 mmol/L (ref 3.5–5.1)
Sodium: 143 mmol/L (ref 135–145)
Total Bilirubin: 0.4 mg/dL (ref 0.3–1.2)
Total Protein: 6.5 g/dL (ref 6.5–8.1)

## 2020-09-24 MED ORDER — SODIUM CHLORIDE 0.9 % IV SOLN
10.0000 mg | Freq: Once | INTRAVENOUS | Status: AC
  Administered 2020-09-24: 10 mg via INTRAVENOUS
  Filled 2020-09-24: qty 10

## 2020-09-24 MED ORDER — SODIUM CHLORIDE 0.9 % IV SOLN
465.5000 mg | Freq: Once | INTRAVENOUS | Status: AC
  Administered 2020-09-24: 470 mg via INTRAVENOUS
  Filled 2020-09-24: qty 47

## 2020-09-24 MED ORDER — PALONOSETRON HCL INJECTION 0.25 MG/5ML
0.2500 mg | Freq: Once | INTRAVENOUS | Status: AC
  Administered 2020-09-24: 0.25 mg via INTRAVENOUS
  Filled 2020-09-24: qty 5

## 2020-09-24 MED ORDER — SODIUM CHLORIDE 0.9 % IV SOLN: Freq: Once | INTRAVENOUS | Status: AC

## 2020-09-24 MED ORDER — SODIUM CHLORIDE 0.9 % IV SOLN
150.0000 mg | Freq: Once | INTRAVENOUS | Status: AC
  Administered 2020-09-24: 150 mg via INTRAVENOUS
  Filled 2020-09-24: qty 150

## 2020-09-24 MED ORDER — SODIUM CHLORIDE 0.9 % IV SOLN
200.0000 mg | Freq: Once | INTRAVENOUS | Status: AC
  Administered 2020-09-24: 200 mg via INTRAVENOUS
  Filled 2020-09-24: qty 8

## 2020-09-24 MED ORDER — SODIUM CHLORIDE 0.9 % IV SOLN
500.0000 mg/m2 | Freq: Once | INTRAVENOUS | Status: AC
  Administered 2020-09-24: 1000 mg via INTRAVENOUS
  Filled 2020-09-24: qty 40

## 2020-09-24 MED ORDER — CYANOCOBALAMIN 1000 MCG/ML IJ SOLN
1000.0000 ug | Freq: Once | INTRAMUSCULAR | Status: AC
  Administered 2020-09-24: 1000 ug via INTRAMUSCULAR
  Filled 2020-09-24: qty 1

## 2020-09-24 NOTE — Patient Instructions (Signed)
[facility]  Discharge Instructions: Thank you for choosing [facility] to provide your oncology and hematology care.   If you have a lab appointment with the [facility], please go directly to the [facility] and check in at the registration area.   Wear comfortable clothing and clothing appropriate for easy access to any Portacath or PICC line.   We strive to give you quality time with your provider. You may need to reschedule your appointment if you arrive late (15 or more minutes).  Arriving late affects you and other patients whose appointments are after yours.  Also, if you miss three or more appointments without notifying the office, you may be dismissed from the clinic at the provider's discretion.      For prescription refill requests, have your pharmacy contact our office and allow 72 hours for refills to be completed.    Today you received the following chemotherapy and/or immunotherapy agents Keytruda, Alimta, and Carboplatin      To help prevent nausea and vomiting after your treatment, we encourage you to take your nausea medication as directed.  BELOW ARE SYMPTOMS THAT SHOULD BE REPORTED IMMEDIATELY: *FEVER GREATER THAN 100.4 F (38 C) OR HIGHER *CHILLS OR SWEATING *NAUSEA AND VOMITING THAT IS NOT CONTROLLED WITH YOUR NAUSEA MEDICATION *UNUSUAL SHORTNESS OF BREATH *UNUSUAL BRUISING OR BLEEDING *URINARY PROBLEMS (pain or burning when urinating, or frequent urination) *BOWEL PROBLEMS (unusual diarrhea, constipation, pain near the anus) TENDERNESS IN MOUTH AND THROAT WITH OR WITHOUT PRESENCE OF ULCERS (sore throat, sores in mouth, or a toothache) UNUSUAL RASH, SWELLING OR PAIN  UNUSUAL VAGINAL DISCHARGE OR ITCHING   Items with * indicate a potential emergency and should be followed up as soon as possible or go to the Emergency Department if any problems should occur.  Please show the CHEMOTHERAPY ALERT CARD or IMMUNOTHERAPY  ALERT CARD at check-in to the Emergency Department and triage nurse.  Should you have questions after your visit or need to cancel or reschedule your appointment, please contact [facility]  Dept: [phone]  and follow the prompts.  Office hours are 8:00 a.m. to 4:30 p.m. Monday - Friday. Please note that voicemails left after 4:00 p.m. may not be returned until the following business day.  We are closed weekends and major holidays. You have access to a nurse at all times for urgent questions. Please call the main number to the clinic Dept: [phone] and follow the prompts.   For any non-urgent questions, you may also contact your provider using MyChart. We now offer e-Visits for anyone 79 and older to request care online for non-urgent symptoms. For details visit [email or web address].   Also download the MyChart app! Go to the app store, search "MyChart", open the app, select [facility], and log in with your MyChart username and password.  Due to Covid, a mask is required upon entering the hospital/clinic. If you do not have a mask, one will be given to you upon arrival. For doctor visits, patients may have 1 support person aged 17 or older with them. For treatment visits, patients cannot have anyone with them due to current Covid guidelines and our immunocompromised population.

## 2020-09-24 NOTE — Progress Notes (Signed)
Dr. Julien Nordmann aware of pts rash.  Ok to proceed w/ Keytruda.  Kennith Center, Pharm.D., CPP 09/24/2020@9 :18 AM

## 2020-09-24 NOTE — Progress Notes (Signed)
[facility] [phone]   Fax:([phone]  OFFICE PROGRESS NOTE  [name], MD [address] [place] [address]  DIAGNOSIS: Stage IV (T1b, N2, M1a) non-small cell lung cancer, adenocarcinoma diagnosed in July 2022 and presented with left upper lobe nodule in addition to AP window lymphadenopathy and left-sided malignant pleural effusion as well as pleural metastatic disease.  The patient also has solitary left temporal brain metastasis.  Biomarker Findings Microsatellite status - MS-Stable Tumor Mutational Burden - 4 Muts/Mb Genomic Findings For a complete list of the genes assayed, please refer to the Appendix. MVH8IO N629B PTEN splice site 284-1L>K - subclonal? DOT1L S911L - subclonal? RAD21 S271* RB1 loss exons 3-23 TP53 N339f*34 8 Disease relevant genes with no reportable alterations: ALK, BRAF, EGFR, ERBB2, KRAS, MET, RET, ROS1  PRIOR THERAPY: None  CURRENT THERAPY: Systemic chemotherapy with carboplatin for AUC of 5, Alimta 500 Mg/M2 and Keytruda 200 Mg IV every 3 weeks.  First dose August 13, 2020.  Status post 2 cycles.  INTERVAL HISTORY: Peter Gopal74y.o. male returns to the clinic today for follow-up visit accompanied by his wife.  The patient tolerated cycle #2 of his chemotherapy fairly well with no concerning adverse effects except for mild fatigue.  He denied having any current chest pain, shortness of breath, cough or hemoptysis.  He has more chest congestion than before.  He denied having any fever or chills.  He has no nausea, vomiting, diarrhea or constipation.  He has no headache or visual changes.  He is here today for evaluation before starting cycle #3 of his treatment.   MEDICAL HISTORY: Past Medical History:  Diagnosis Date   Lung cancer ([facility]    Lung cancer metastatic to brain ([facility]     ALLERGIES:  is allergic to venlafaxine.  MEDICATIONS:  Current Outpatient Medications  Medication Sig Dispense  Refill   aspirin 81 MG EC tablet Take by mouth.     Bacillus Coagulans-Inulin (PROBIOTIC) 1-250 BILLION-MG CAPS Take 1 tablet by mouth daily.     coal tar-salicylic acid 2 % shampoo Apply topically daily as needed for itching.     DULoxetine (CYMBALTA) 30 MG capsule Take 30 mg by mouth daily.     fluocinonide cream (LIDEX) 04.40% Apply 1 application topically 2 (two) times daily.     folic acid (FOLVITE) 1 MG tablet Take 1 tablet (1 mg total) by mouth daily. 30 tablet 4   Glucosamine-Chondroitin 250-200 MG TABS Take 1 tablet by mouth daily.     hydrocortisone cream 1 % Apply 1 application topically 2 (two) times daily. 453.6 g 0   ketoconazole (NIZORAL) 2 % cream Apply 1 application topically daily.     Krill Oil (OMEGA-3) 500 MG CAPS Take by mouth.     loratadine (CLARITIN) 10 MG tablet Take by mouth.     Magnesium 250 MG TABS Take by mouth.     omeprazole (PRILOSEC) 40 MG capsule Take 40 mg by mouth daily.     OVER THE COUNTER MEDICATION PNEUMOTROPHIN PMG     prochlorperazine (COMPAZINE) 10 MG tablet TAKE 1 TABLET(10 MG) BY MOUTH EVERY 6 HOURS AS NEEDED FOR NAUSEA OR VOMITING 30 tablet 0   triamcinolone cream (KENALOG) 0.1 % Apply 1 application topically daily as needed. 453 g 6   No current facility-administered medications for this visit.    SURGICAL HISTORY:  Past Surgical History:  Procedure Laterality Date   KNEE SURGERY  orthoscopic     TONSILLECTOMY AND ADENOIDECTOMY      REVIEW OF SYSTEMS:  A comprehensive review of systems was negative except for: Constitutional: positive for fatigue   PHYSICAL EXAMINATION: General appearance: alert, cooperative, fatigued, and no distress Head: Normocephalic, without obvious abnormality, atraumatic Neck: no adenopathy, no JVD, supple, symmetrical, trachea midline, and thyroid not enlarged, symmetric, no tenderness/mass/nodules Lymph nodes: Cervical, supraclavicular, and axillary nodes normal. Resp: clear to auscultation  bilaterally Back: symmetric, no curvature. ROM normal. No CVA tenderness. Cardio: regular rate and rhythm, S1, S2 normal, no murmur, click, rub or gallop GI: soft, non-tender; bowel sounds normal; no masses,  no organomegaly Extremities: extremities normal, atraumatic, no cyanosis or edema  ECOG PERFORMANCE STATUS: 1 - Symptomatic but completely ambulatory  Blood pressure 134/72, pulse 73, temperature (!) 97.5 F (36.4 C), temperature source Tympanic, resp. rate 16, height 6' 1" (1.854 m), weight 180 lb 14.4 oz (82.1 kg), SpO2 99 %.  LABORATORY DATA: Lab Results  Component Value Date   WBC 3.6 (L) 09/24/2020   HGB 11.6 (L) 09/24/2020   HCT 33.7 (L) 09/24/2020   MCV 91.8 09/24/2020   PLT 212 09/24/2020      Chemistry      Component Value Date/Time   NA 141 09/16/2020 1109   K 4.3 09/16/2020 1109   CL 105 09/16/2020 1109   CO2 26 09/16/2020 1109   BUN 13 09/16/2020 1109   CREATININE 1.01 09/16/2020 1109      Component Value Date/Time   CALCIUM 9.7 09/16/2020 1109   ALKPHOS 93 09/16/2020 1109   AST 23 09/16/2020 1109   ALT 20 09/16/2020 1109   BILITOT 0.4 09/16/2020 1109       RADIOGRAPHIC STUDIES: MR Brain W Wo Contrast  Result Date: 08/29/2020 CLINICAL DATA:  Brain/CNS neoplasm, staging, radiation planning. History of lung cancer primary EXAM: MRI HEAD WITHOUT AND WITH CONTRAST TECHNIQUE: Multiplanar, multiecho pulse sequences of the brain and surrounding structures were obtained without and with intravenous contrast. CONTRAST:  68m MULTIHANCE GADOBENATE DIMEGLUMINE 529 MG/ML IV SOLN COMPARISON:  Brain MRI 08/13/2020 FINDINGS: Brain: There is a 6 mm AP by 5 mm TV ring-enhancing lesion in the left temporal lobe, decreased in size from 9 mm by 9 mm. There is no significant surrounding edema. No intralesional hemorrhage is identified. No other enhancing lesions are identified. There is no other suspicious parenchymal signal abnormality. The ventricles are stable in size.  There is no midline shift. There is no evidence of acute intracranial hemorrhage or infarct. Vascular: Normal flow voids. Skull and upper cervical spine: Normal marrow signal. Sinuses/Orbits: Negative. Other: None. IMPRESSION: 1. Interval decrease in size of a single ring-enhancing metastatic lesion in the left temporal lobe, now measuring 6 mm x 5 mm. 2. No new lesions identified. Electronically Signed   By: PValetta MoleM.D.   On: 08/29/2020 09:07    ASSESSMENT AND PLAN: This is a very pleasant 80years old white male recently diagnosed with a stage IV (T1b, N2, M1 a) non-small cell lung cancer, adenocarcinoma presented with left upper lobe lung nodule in addition to mediastinal lymphadenopathy and pleural-based metastasis as well as malignant left pleural effusion diagnosed in July 2022. His molecular studies by foundation 1 showed no actionable mutations. The patient is currently undergoing systemic chemotherapy with carboplatin for AUC of 5, Alimta 500 Mg/M2 and Keytruda 200 Mg IV every 3 weeks.  He is status post 2 cycles. The patient continues to tolerate his treatment fairly well with  no concerning adverse effect except for mild fatigue. I recommended for him to proceed with cycle #3 today as planned. I will see him back for follow-up visit in 3 weeks for evaluation with repeat CT scan of the chest, abdomen pelvis for restaging of his disease before starting cycle #4. The patient was advised to call immediately if he has any other concerning symptoms in the interval. The patient voices understanding of current disease status and treatment options and is in agreement with the current care plan.  All questions were answered. The patient knows to call the clinic with any problems, questions or concerns. We can certainly see the patient much sooner if necessary.  Disclaimer: This note was dictated with voice recognition software. Similar sounding words can inadvertently be transcribed and may not be  corrected upon review.

## 2020-09-25 ENCOUNTER — Telehealth: Payer: Self-pay | Admitting: Internal Medicine

## 2020-09-25 NOTE — Telephone Encounter (Signed)
Scheduled appt per 9/21 los - patient is aware of appts - called and confirmed

## 2020-10-01 ENCOUNTER — Other Ambulatory Visit: Payer: Self-pay

## 2020-10-01 ENCOUNTER — Inpatient Hospital Stay: Payer: Medicare HMO

## 2020-10-01 DIAGNOSIS — C3492 Malignant neoplasm of unspecified part of left bronchus or lung: Secondary | ICD-10-CM

## 2020-10-01 DIAGNOSIS — Z5112 Encounter for antineoplastic immunotherapy: Secondary | ICD-10-CM | POA: Diagnosis not present

## 2020-10-01 LAB — CBC WITH DIFFERENTIAL (CANCER CENTER ONLY)
Abs Immature Granulocytes: 0.01 10*3/uL (ref 0.00–0.07)
Basophils Absolute: 0 10*3/uL (ref 0.0–0.1)
Basophils Relative: 1 %
Eosinophils Absolute: 0.1 10*3/uL (ref 0.0–0.5)
Eosinophils Relative: 6 %
HCT: 32.5 % — ABNORMAL LOW (ref 39.0–52.0)
Hemoglobin: 11.2 g/dL — ABNORMAL LOW (ref 13.0–17.0)
Immature Granulocytes: 1 %
Lymphocytes Relative: 50 %
Lymphs Abs: 1.1 10*3/uL (ref 0.7–4.0)
MCH: 31.4 pg (ref 26.0–34.0)
MCHC: 34.5 g/dL (ref 30.0–36.0)
MCV: 91 fL (ref 80.0–100.0)
Monocytes Absolute: 0.3 10*3/uL (ref 0.1–1.0)
Monocytes Relative: 12 %
Neutro Abs: 0.7 10*3/uL — ABNORMAL LOW (ref 1.7–7.7)
Neutrophils Relative %: 30 %
Platelet Count: 112 10*3/uL — ABNORMAL LOW (ref 150–400)
RBC: 3.57 MIL/uL — ABNORMAL LOW (ref 4.22–5.81)
RDW: 14.1 % (ref 11.5–15.5)
WBC Count: 2.2 10*3/uL — ABNORMAL LOW (ref 4.0–10.5)
nRBC: 0 % (ref 0.0–0.2)

## 2020-10-01 LAB — CMP (CANCER CENTER ONLY)
ALT: 17 U/L (ref 0–44)
AST: 23 U/L (ref 15–41)
Albumin: 3.9 g/dL (ref 3.5–5.0)
Alkaline Phosphatase: 97 U/L (ref 38–126)
Anion gap: 11 (ref 5–15)
BUN: 21 mg/dL (ref 8–23)
CO2: 25 mmol/L (ref 22–32)
Calcium: 9.6 mg/dL (ref 8.9–10.3)
Chloride: 105 mmol/L (ref 98–111)
Creatinine: 1.08 mg/dL (ref 0.61–1.24)
GFR, Estimated: >60 mL/min (ref >60)
Glucose, Bld: 104 mg/dL — ABNORMAL HIGH (ref 70–99)
Potassium: 4.4 mmol/L (ref 3.5–5.1)
Sodium: 141 mmol/L (ref 135–145)
Total Bilirubin: 0.5 mg/dL (ref 0.3–1.2)
Total Protein: 6.8 g/dL (ref 6.5–8.1)

## 2020-10-05 NOTE — Progress Notes (Signed)
  Radiation Oncology         [phone] ________________________________  Name: Peter Brown MRN: [id number]  Date of Service: 10/06/2020  DOB: Apr 23, 1940  Post Treatment Telephone Note  Diagnosis:   Stage IV, [id number], NSCLC, adenocarcinoma of the Left Upper Lobe with malignant left pleural effusion and solitary brain metastasis at the time of diagnosis.  Interval Since Last Radiation:  5 weeks   09/03/2020 through 09/03/2020 Site Technique Total Dose (Gy) Dose per Fx (Gy) Completed Fx Beam Energies  Brain: Brain [id number] 3D 20/20 20 1/1 6XFFF    Narrative:  The patient was contacted today for routine follow-up. During treatment he did very well with radiotherapy and did not have significant desquamation. He reports he is doing well but reports his breathing is slightly worse. He reports he is struggling with his insurance coverage for his treatment. He will be due for another CT scan with Dr. Julien Nordmann in a few weeks after cycle #3 of his chemo immunotherapy.  Impression/Plan: 1. 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 The patient has been doing well since completion of radiotherapy. We discussed that we would be happy to continue to follow him in brain oncology conference with MRI, but he will also continue to follow up with Dr. Julien Nordmann in medical oncology.      Carola Rhine, PAC

## 2020-10-06 ENCOUNTER — Ambulatory Visit
Admission: RE | Admit: 2020-10-06 | Discharge: 2020-10-06 | Disposition: A | Discharge status: Home / Self Care | Payer: Medicare HMO | Source: Ambulatory Visit | Attending: Radiation Oncology | Admitting: Radiation Oncology

## 2020-10-06 DIAGNOSIS — C7931 Secondary malignant neoplasm of brain: Secondary | ICD-10-CM

## 2020-10-06 DIAGNOSIS — C3492 Malignant neoplasm of unspecified part of left bronchus or lung: Secondary | ICD-10-CM

## 2020-10-07 ENCOUNTER — Telehealth: Payer: Self-pay

## 2020-10-07 NOTE — Telephone Encounter (Signed)
Pt LM stating he has been advised his CT scan had to be cancelled because his insurance has not approved the PA. Pt states [facility] Falling is telling him something different.  Pt states he has a PA approval number from [facility] and wants keep his CT scan appt. I have transferred the pt to Radiology Scheduling to r/s this appt.

## 2020-10-08 ENCOUNTER — Ambulatory Visit (HOSPITAL_COMMUNITY): Payer: Medicare HMO

## 2020-10-08 ENCOUNTER — Other Ambulatory Visit: Payer: Medicare HMO

## 2020-10-08 ENCOUNTER — Inpatient Hospital Stay: Payer: Medicare HMO | Attending: Internal Medicine

## 2020-10-08 ENCOUNTER — Encounter (HOSPITAL_COMMUNITY): Payer: Self-pay

## 2020-10-08 ENCOUNTER — Other Ambulatory Visit: Payer: Self-pay

## 2020-10-08 DIAGNOSIS — C7931 Secondary malignant neoplasm of brain: Secondary | ICD-10-CM | POA: Diagnosis not present

## 2020-10-08 DIAGNOSIS — Z5112 Encounter for antineoplastic immunotherapy: Secondary | ICD-10-CM | POA: Diagnosis not present

## 2020-10-08 DIAGNOSIS — J910 Malignant pleural effusion: Secondary | ICD-10-CM | POA: Insufficient documentation

## 2020-10-08 DIAGNOSIS — C3412 Malignant neoplasm of upper lobe, left bronchus or lung: Secondary | ICD-10-CM | POA: Diagnosis not present

## 2020-10-08 DIAGNOSIS — Z79899 Other long term (current) drug therapy: Secondary | ICD-10-CM | POA: Diagnosis not present

## 2020-10-08 DIAGNOSIS — Z5111 Encounter for antineoplastic chemotherapy: Secondary | ICD-10-CM | POA: Diagnosis present

## 2020-10-08 DIAGNOSIS — C3492 Malignant neoplasm of unspecified part of left bronchus or lung: Secondary | ICD-10-CM

## 2020-10-08 LAB — CMP (CANCER CENTER ONLY)
ALT: 15 U/L (ref 0–44)
AST: 20 U/L (ref 15–41)
Albumin: 3.8 g/dL (ref 3.5–5.0)
Alkaline Phosphatase: 90 U/L (ref 38–126)
Anion gap: 11 (ref 5–15)
BUN: 11 mg/dL (ref 8–23)
CO2: 25 mmol/L (ref 22–32)
Calcium: 9.5 mg/dL (ref 8.9–10.3)
Chloride: 105 mmol/L (ref 98–111)
Creatinine: 1.14 mg/dL (ref 0.61–1.24)
GFR, Estimated: >60 mL/min (ref >60)
Glucose, Bld: 90 mg/dL (ref 70–99)
Potassium: 4.3 mmol/L (ref 3.5–5.1)
Sodium: 141 mmol/L (ref 135–145)
Total Bilirubin: 0.5 mg/dL (ref 0.3–1.2)
Total Protein: 6.7 g/dL (ref 6.5–8.1)

## 2020-10-08 LAB — CBC WITH DIFFERENTIAL (CANCER CENTER ONLY)
Abs Immature Granulocytes: 0.03 10*3/uL (ref 0.00–0.07)
Basophils Absolute: 0 10*3/uL (ref 0.0–0.1)
Basophils Relative: 0 %
Eosinophils Absolute: 0.1 10*3/uL (ref 0.0–0.5)
Eosinophils Relative: 2 %
HCT: 29.7 % — ABNORMAL LOW (ref 39.0–52.0)
Hemoglobin: 10.2 g/dL — ABNORMAL LOW (ref 13.0–17.0)
Immature Granulocytes: 1 %
Lymphocytes Relative: 21 %
Lymphs Abs: 0.9 10*3/uL (ref 0.7–4.0)
MCH: 32 pg (ref 26.0–34.0)
MCHC: 34.3 g/dL (ref 30.0–36.0)
MCV: 93.1 fL (ref 80.0–100.0)
Monocytes Absolute: 1.2 10*3/uL — ABNORMAL HIGH (ref 0.1–1.0)
Monocytes Relative: 28 %
Neutro Abs: 2.2 10*3/uL (ref 1.7–7.7)
Neutrophils Relative %: 48 %
Platelet Count: 72 10*3/uL — ABNORMAL LOW (ref 150–400)
RBC: 3.19 MIL/uL — ABNORMAL LOW (ref 4.22–5.81)
RDW: 14.8 % (ref 11.5–15.5)
WBC Count: 4.5 10*3/uL (ref 4.0–10.5)
nRBC: 0 % (ref 0.0–0.2)

## 2020-10-08 LAB — TSH: TSH: 2 u[IU]/mL (ref 0.320–4.118)

## 2020-10-09 ENCOUNTER — Other Ambulatory Visit: Payer: Self-pay | Admitting: Internal Medicine

## 2020-10-09 DIAGNOSIS — C787 Secondary malignant neoplasm of liver and intrahepatic bile duct: Secondary | ICD-10-CM

## 2020-10-12 ENCOUNTER — Encounter: Payer: Self-pay | Admitting: Internal Medicine

## 2020-10-13 ENCOUNTER — Ambulatory Visit (HOSPITAL_COMMUNITY): Payer: Medicare HMO

## 2020-10-14 ENCOUNTER — Encounter: Payer: Self-pay | Admitting: Internal Medicine

## 2020-10-14 ENCOUNTER — Other Ambulatory Visit: Payer: Self-pay

## 2020-10-14 DIAGNOSIS — M47817 Spondylosis without myelopathy or radiculopathy, lumbosacral region: Secondary | ICD-10-CM

## 2020-10-14 DIAGNOSIS — E782 Mixed hyperlipidemia: Secondary | ICD-10-CM

## 2020-10-14 DIAGNOSIS — M159 Polyosteoarthritis, unspecified: Secondary | ICD-10-CM | POA: Insufficient documentation

## 2020-10-14 DIAGNOSIS — L408 Other psoriasis: Secondary | ICD-10-CM

## 2020-10-14 DIAGNOSIS — J309 Allergic rhinitis, unspecified: Secondary | ICD-10-CM | POA: Insufficient documentation

## 2020-10-14 DIAGNOSIS — R209 Unspecified disturbances of skin sensation: Secondary | ICD-10-CM | POA: Insufficient documentation

## 2020-10-14 HISTORY — DX: Mixed hyperlipidemia: E78.2

## 2020-10-14 HISTORY — DX: Other psoriasis: L40.8

## 2020-10-14 HISTORY — DX: Spondylosis without myelopathy or radiculopathy, lumbosacral region: M47.817

## 2020-10-15 NOTE — Progress Notes (Signed)
[facility] OFFICE PROGRESS NOTE  [name], MD [address] [place] [address]  DIAGNOSIS: Stage IV (T1b, N2, M1 a) non-small cell lung cancer, adenocarcinoma presented with left upper lobe lung nodule in addition to mediastinal lymphadenopathy and pleural-based metastasis as well as malignant left pleural effusion diagnosed in July 2022.   Molecular Studies: Biomarker Findings Microsatellite status - MS-Stable Tumor Mutational Burden - 4 Muts/Mb Genomic Findings For a complete list of the genes assayed, please refer to the Appendix. [id number] T625W PTEN splice site 389-3T>D - subclonal? DOT1L S911L - subclonal? RAD21 S271* RB1 loss exons 3-23 TP53 N364f*34 8 Disease relevant genes with no reportable alterations: ALK, BRAF, EGFR, ERBB2, KRAS, MET, RET, ROS1  PRIOR THERAPY: SRS to the metastatic brain lesion under the care of Dr. MLisbeth Renshaw Completed on 09/03/20.  CURRENT THERAPY: Systemic chemotherapy with carboplatin for AUC of 5, Alimta 500 Mg/M2 and Keytruda 200 Mg IV every 3 weeks.  Status post 1 cycle.  First dose on 08/13/20.  Status post 3 cycles.  INTERVAL HISTORY: JNaszir [id number].[id number]. male returns to the clinic today for a follow-up visit.  The patient is feeling fairly well today without any concerning complaints except in the interval since his last appointment, the patient's positive for COVID-19 on 10/08/2020.  The patient's symptoms included fever, nasal congestion, cough, taste alterations, and fatigue.  He states today that he is feeling 70% better.  He has not had any fevers in over a week.  His T-max on the day he tested positive was 103.2.  He took Tylenol for his fever.  He still has some lingering congestion and taste alterations.  He is trying to be diligent with brushing his tongue.  He is wondering what he can take for his congestion.  He was given TLadona Ridgelin the emergency room, unclear if this is effective for his cough.  From coughing he  has some occasional left rib discomfort that is nonexertional and not associated with any lightheadedness, nausea, or shortness of breath.  He reports he had some mild increase in shortness of breath since being diagnosed with COVID.  The cough is improving but is still producing some clear yellow sputum.  He was previously pretty active walking 2 miles a day but he now is walking 1 mile or less a day before he has to stop and rest.  He continues to have some fatigue.  He also reports some right shoulder blade pain that is positional and improves with Tylenol or repositioning.   His weight is relatively stable although he reports decreased appetite due to the taste alterations.  He denies any hemoptysis.  He reports occasional nausea for which she has antiemetics.  He denies any vomiting.  He sometimes has baseline constipation for which he will use a laxative, prune juice, stool softener, and drink plenty of fluids.  The patient was supposed in a restaging CT scan but this has been delayed until 10/22/2020 due to his recent COVID-19 infection.  He is here today for evaluation and repeat blood work before considering starting his next cycle of treatment with cycle #4.   MEDICAL HISTORY: Past Medical History:  Diagnosis Date   Lung cancer ([facility]    Lung cancer metastatic to brain ([facility]     ALLERGIES:  has no active allergies.  MEDICATIONS:  Current Outpatient Medications  Medication Sig Dispense Refill   aspirin 81 MG EC tablet Take by mouth.     Bacillus Coagulans-Inulin (PROBIOTIC) 1-250  BILLION-MG CAPS Take 1 tablet by mouth daily.     Benzocaine-Menthol 15-3.6 MG LOZG Take by mouth.     benzonatate (TESSALON) 100 MG capsule Take 100 mg by mouth every 8 (eight) hours.     coal tar-salicylic acid 2 % shampoo Apply topically daily as needed for itching.     DULoxetine (CYMBALTA) 30 MG capsule Take 30 mg by mouth daily.     fluocinonide cream (LIDEX) 0.56 % Apply 1 application topically 2 (two)  times daily.     folic acid (FOLVITE) 1 MG tablet Take 1 tablet (1 mg total) by mouth daily. 30 tablet 4   Glucosamine-Chondroitin 250-200 MG TABS Take 1 tablet by mouth daily.     hydrocortisone cream 1 % Apply 1 application topically 2 (two) times daily. 453.6 g 0   HYDROMET 5-1.5 MG/5ML syrup Take 5 mLs by mouth every 6 (six) hours as needed.     ketoconazole (NIZORAL) 2 % cream Apply 1 application topically daily.     Krill Oil (OMEGA-3) 500 MG CAPS Take by mouth.     loratadine (CLARITIN) 10 MG tablet Take by mouth.     Magnesium 250 MG TABS Take by mouth.     omeprazole (PRILOSEC) 40 MG capsule Take 40 mg by mouth daily.     ondansetron (ZOFRAN-ODT) 4 MG disintegrating tablet Take 4 mg by mouth every 8 (eight) hours as needed.     OVER THE COUNTER MEDICATION PNEUMOTROPHIN PMG     prochlorperazine (COMPAZINE) 10 MG tablet TAKE 1 TABLET(10 MG) BY MOUTH EVERY 6 HOURS AS NEEDED FOR NAUSEA OR VOMITING 30 tablet 0   triamcinolone cream (KENALOG) 0.1 % Apply 1 application topically daily as needed. 453 g 6   No current facility-administered medications for this visit.    SURGICAL HISTORY:  Past Surgical History:  Procedure Laterality Date   KNEE SURGERY     orthoscopic     TONSILLECTOMY AND ADENOIDECTOMY      REVIEW OF SYSTEMS:   Review of Systems  Constitutional: Positive for fatigue and decreased appetite.  Negative for chills, fever (resolved) and unexpected weight change.  HENT: Positive for taste alterations.  Negative for mouth sores, nosebleeds, sore throat and trouble swallowing.   Eyes: Negative for eye problems and icterus.  Respiratory: Positive for improving productive cough.  Positive for mild shortness of breath with exertion.  Negative for hemoptysis and wheezing.   Cardiovascular: Negative for chest pain and leg swelling.  Gastrointestinal: Negative for abdominal pain, constipation, diarrhea, nausea and vomiting.  Genitourinary: Negative for bladder incontinence,  difficulty urinating, dysuria, frequency and hematuria.   Musculoskeletal: Negative for back pain, gait problem, neck pain and neck stiffness.  Skin: Negative for itching and rash.  Neurological: Negative for dizziness, extremity weakness, gait problem, headaches, light-headedness and seizures.  Hematological: Negative for adenopathy. Does not bruise/bleed easily.  Psychiatric/Behavioral: Negative for confusion, depression and sleep disturbance. The patient is not nervous/anxious.     PHYSICAL EXAMINATION:  There were no vitals taken for this visit.  ECOG PERFORMANCE STATUS: 1  Physical Exam  Constitutional: Oriented to person, place, and time and well-developed, well-nourished, and in no distress.  HENT:  Head: Normocephalic and atraumatic.  Mouth/Throat: Oropharynx is clear and moist. No oropharyngeal exudate.  Eyes: Conjunctivae are normal. Right eye exhibits no discharge. Left eye exhibits no discharge. No scleral icterus.  Neck: Normal range of motion. Neck supple.  Cardiovascular: Normal rate, regular rhythm, normal heart sounds and intact distal pulses.  Pulmonary/Chest: Effort normal.  Lungs clear to auscultation except for mild crackles at the base of the left lung.  No respiratory distress. No wheezes.   Abdominal: Soft. Bowel sounds are normal. Exhibits no distension and no mass. There is no tenderness.  Musculoskeletal: Normal range of motion. Exhibits no edema.  Lymphadenopathy:    No cervical adenopathy.  Neurological: Alert and oriented to person, place, and time. Exhibits normal muscle tone. Gait normal. Coordination normal.  Skin: Skin is warm and dry. No rash noted. Not diaphoretic. No erythema. No pallor.  Psychiatric: Mood, memory and judgment normal.  Vitals reviewed.  LABORATORY DATA: Lab Results  Component Value Date   WBC 6.1 10/20/2020   HGB 10.8 (L) 10/20/2020   HCT 32.1 (L) 10/20/2020   MCV 93.9 10/20/2020   PLT 244 10/20/2020      Chemistry       Component Value Date/Time   NA 141 10/08/2020 1029   K 4.3 10/08/2020 1029   CL 105 10/08/2020 1029   CO2 25 10/08/2020 1029   BUN 11 10/08/2020 1029   CREATININE 1.14 10/08/2020 1029      Component Value Date/Time   CALCIUM 9.5 10/08/2020 1029   ALKPHOS 90 10/08/2020 1029   AST 20 10/08/2020 1029   ALT 15 10/08/2020 1029   BILITOT 0.5 10/08/2020 1029       RADIOGRAPHIC STUDIES:  No results found.   ASSESSMENT/PLAN:  This is a very pleasant 80 year old Caucasian male diagnosed with stage IV (T1b, N2, M1A) non-small cell lung cancer, adenocarcinoma.  The patient presented with a left upper lobe lung nodule in addition to mediastinal lymphadenopathy and pleural-based metastasis as well as a malignant left pleural effusion.  The patient was diagnosed in July 2022.  The patient also had a metastatic brain lesion.  The patient completed SRS to the brain lesion on 09/03/2020 under the care of Dr. [name].  The patient is currently undergoing systemic chemotherapy with carboplatin for an AUC of 5, Alimta 500 mg per metered squared, Keytruda 200 mg IV every 3 weeks.  The patient is status post 3 cycles.  The patient was supposed to have a restaging CT scan but it has not been scheduled until 10/22/20.   His labs are still pending.  As long as his labs are within parameters, the patient states that he feels well enough to proceed with treatment as scheduled.  The patient was given the option to delay treatment by 1 week but he states that he would like to "get it over with" and he feels 70% better.  He is well-appearing today and his vitals are within normal limits.  His oxygen saturation is 99%. He is laughing and making jokes in the office visit today.  His lungs are clear to auscultation.  Recommend that he proceed with cycle #4 today scheduled as long as his labs are within parameters.  Regarding the fatigue, this could be multifactorial from his chemotherapy and recent COVID-19  infection.  We will ensure that the patient is not significantly anemic.  We would arrange for a blood transfusion if his hemoglobin were less than 8.  He continues to walk daily.  Regarding his cough, he has a prescription for [employer]  Discussed if this is ineffective he can try Delsym or Robitussin.  For the taste alterations, no evidence of thrush on exam.  Also discussed this may be secondary to the carboplatin and/or COVID-19.  Advised to use salt water rinses or Biotene and to  perform good oral hygiene.   For his lingering congestion, discussed that it is okay to use saline spray, Claritin, or Mucinex if needed.  For the right scapular pain and occasional chest discomfort with coughing, this is nonexertional without any associated nausea, jaw pain, lightheadedness, or shortness of breath.  We will reassess his response to treatment on his upcoming restaging CT scan on 10/22/2020.  I will personally call the patient with the results. Advised to continue to use Tylenol or heating pads if needed.  Reviewed his antiemetics.  He was given Zofran in the emergency room.  Advised to use Compazine if he has nausea within 3 days of chemotherapy.  Review constipation education.  The patient is drinking plenty of fluids, eating fruits and vegetables, drinking prune juice, and using a stool softener if needed.  He also knows to take a laxative if needed as well.  We will see him back for follow-up visit in 3 weeks for evaluation to review his scan results.  Of course, if there is any urgent findings on his upcoming CT scan we will call the patient sooner.  Reviewed his schedule with him. We will review with him at his next appointment changes to his schedule with maintenance treatment.   I reviewed the patient's symptoms with Dr. Julien Nordmann who is in agreement with the plan.   The patient was advised to call immediately if he has any concerning symptoms in the interval. The patient voices  understanding of current disease status and treatment options and is in agreement with the current care plan. All questions were answered. The patient knows to call the clinic with any problems, questions or concerns. We can certainly see the patient much sooner if necessary       No orders of the defined types were placed in this encounter.   The total time spent in the appointment was 30-39 minutes.   Shafter Jupin L Ki Luckman, PA-C 10/20/20

## 2020-10-17 ENCOUNTER — Ambulatory Visit: Payer: Medicare HMO | Admitting: Internal Medicine

## 2020-10-17 ENCOUNTER — Ambulatory Visit: Payer: Medicare HMO

## 2020-10-17 ENCOUNTER — Other Ambulatory Visit: Payer: Medicare HMO

## 2020-10-20 ENCOUNTER — Inpatient Hospital Stay (HOSPITAL_BASED_OUTPATIENT_CLINIC_OR_DEPARTMENT_OTHER): Payer: Medicare HMO | Admitting: Physician Assistant

## 2020-10-20 ENCOUNTER — Other Ambulatory Visit: Payer: Self-pay

## 2020-10-20 ENCOUNTER — Inpatient Hospital Stay: Payer: Medicare HMO

## 2020-10-20 VITALS — BP 148/82 | HR 73 | Temp 98.7°F | Resp 16

## 2020-10-20 DIAGNOSIS — C3492 Malignant neoplasm of unspecified part of left bronchus or lung: Secondary | ICD-10-CM

## 2020-10-20 DIAGNOSIS — Z5112 Encounter for antineoplastic immunotherapy: Secondary | ICD-10-CM | POA: Diagnosis not present

## 2020-10-20 DIAGNOSIS — Z5111 Encounter for antineoplastic chemotherapy: Secondary | ICD-10-CM

## 2020-10-20 LAB — CBC WITH DIFFERENTIAL (CANCER CENTER ONLY)
Abs Immature Granulocytes: 0.05 10*3/uL (ref 0.00–0.07)
Basophils Absolute: 0 10*3/uL (ref 0.0–0.1)
Basophils Relative: 0 %
Eosinophils Absolute: 0.2 10*3/uL (ref 0.0–0.5)
Eosinophils Relative: 4 %
HCT: 32.1 % — ABNORMAL LOW (ref 39.0–52.0)
Hemoglobin: 10.8 g/dL — ABNORMAL LOW (ref 13.0–17.0)
Immature Granulocytes: 1 %
Lymphocytes Relative: 24 %
Lymphs Abs: 1.5 10*3/uL (ref 0.7–4.0)
MCH: 31.6 pg (ref 26.0–34.0)
MCHC: 33.6 g/dL (ref 30.0–36.0)
MCV: 93.9 fL (ref 80.0–100.0)
Monocytes Absolute: 0.9 10*3/uL (ref 0.1–1.0)
Monocytes Relative: 15 %
Neutro Abs: 3.4 10*3/uL (ref 1.7–7.7)
Neutrophils Relative %: 56 %
Platelet Count: 244 10*3/uL (ref 150–400)
RBC: 3.42 MIL/uL — ABNORMAL LOW (ref 4.22–5.81)
RDW: 15.7 % — ABNORMAL HIGH (ref 11.5–15.5)
WBC Count: 6.1 10*3/uL (ref 4.0–10.5)
nRBC: 0 % (ref 0.0–0.2)

## 2020-10-20 LAB — CMP (CANCER CENTER ONLY)
ALT: 13 U/L (ref 0–44)
AST: 18 U/L (ref 15–41)
Albumin: 3.6 g/dL (ref 3.5–5.0)
Alkaline Phosphatase: 95 U/L (ref 38–126)
Anion gap: 12 (ref 5–15)
BUN: 14 mg/dL (ref 8–23)
CO2: 25 mmol/L (ref 22–32)
Calcium: 10.1 mg/dL (ref 8.9–10.3)
Chloride: 106 mmol/L (ref 98–111)
Creatinine: 0.98 mg/dL (ref 0.61–1.24)
GFR, Estimated: >60 mL/min (ref >60)
Glucose, Bld: 92 mg/dL (ref 70–99)
Potassium: 4.5 mmol/L (ref 3.5–5.1)
Sodium: 143 mmol/L (ref 135–145)
Total Bilirubin: 0.3 mg/dL (ref 0.3–1.2)
Total Protein: 7.4 g/dL (ref 6.5–8.1)

## 2020-10-20 LAB — TSH: TSH: 2.607 u[IU]/mL (ref 0.320–4.118)

## 2020-10-20 MED ORDER — SODIUM CHLORIDE 0.9 % IV SOLN: Freq: Once | INTRAVENOUS | Status: AC

## 2020-10-20 MED ORDER — SODIUM CHLORIDE 0.9 % IV SOLN
10.0000 mg | Freq: Once | INTRAVENOUS | Status: AC
  Administered 2020-10-20: 10 mg via INTRAVENOUS
  Filled 2020-10-20: qty 10

## 2020-10-20 MED ORDER — SODIUM CHLORIDE 0.9 % IV SOLN
200.0000 mg | Freq: Once | INTRAVENOUS | Status: AC
  Administered 2020-10-20: 200 mg via INTRAVENOUS
  Filled 2020-10-20: qty 8

## 2020-10-20 MED ORDER — SODIUM CHLORIDE 0.9 % IV SOLN
426.5000 mg | Freq: Once | INTRAVENOUS | Status: AC
  Administered 2020-10-20: 430 mg via INTRAVENOUS
  Filled 2020-10-20: qty 43

## 2020-10-20 MED ORDER — PALONOSETRON HCL INJECTION 0.25 MG/5ML
0.2500 mg | Freq: Once | INTRAVENOUS | Status: AC
  Administered 2020-10-20: 0.25 mg via INTRAVENOUS
  Filled 2020-10-20: qty 5

## 2020-10-20 MED ORDER — SODIUM CHLORIDE 0.9 % IV SOLN
150.0000 mg | Freq: Once | INTRAVENOUS | Status: AC
  Administered 2020-10-20: 150 mg via INTRAVENOUS
  Filled 2020-10-20: qty 150

## 2020-10-20 MED ORDER — SODIUM CHLORIDE 0.9 % IV SOLN
500.0000 mg/m2 | Freq: Once | INTRAVENOUS | Status: AC
  Administered 2020-10-20: 1000 mg via INTRAVENOUS
  Filled 2020-10-20: qty 40

## 2020-10-20 NOTE — Patient Instructions (Signed)
[facility] ONCOLOGY   Discharge Instructions: Thank you for choosing [facility] to provide your oncology and hematology care.   If you have a lab appointment with the [facility], please go directly to the [facility] and check in at the registration area.   Wear comfortable clothing and clothing appropriate for easy access to any Portacath or PICC line.   We strive to give you quality time with your provider. You may need to reschedule your appointment if you arrive late (15 or more minutes).  Arriving late affects you and other patients whose appointments are after yours.  Also, if you miss three or more appointments without notifying the office, you may be dismissed from the clinic at the provider's discretion.      For prescription refill requests, have your pharmacy contact our office and allow 72 hours for refills to be completed.    Today you received the following chemotherapy and/or immunotherapy agents: Pembrolizumab (Keytruda), Pemetrexed (Alimta), and Carboplatin       To help prevent nausea and vomiting after your treatment, we encourage you to take your nausea medication as directed.  BELOW ARE SYMPTOMS THAT SHOULD BE REPORTED IMMEDIATELY: *FEVER GREATER THAN 100.4 F (38 C) OR HIGHER *CHILLS OR SWEATING *NAUSEA AND VOMITING THAT IS NOT CONTROLLED WITH YOUR NAUSEA MEDICATION *UNUSUAL SHORTNESS OF BREATH *UNUSUAL BRUISING OR BLEEDING *URINARY PROBLEMS (pain or burning when urinating, or frequent urination) *BOWEL PROBLEMS (unusual diarrhea, constipation, pain near the anus) TENDERNESS IN MOUTH AND THROAT WITH OR WITHOUT PRESENCE OF ULCERS (sore throat, sores in mouth, or a toothache) UNUSUAL RASH, SWELLING OR PAIN  UNUSUAL VAGINAL DISCHARGE OR ITCHING   Items with * indicate a potential emergency and should be followed up as soon as possible or go to the Emergency Department if any problems should occur.  Please show the  CHEMOTHERAPY ALERT CARD or IMMUNOTHERAPY ALERT CARD at check-in to the Emergency Department and triage nurse.  Should you have questions after your visit or need to cancel or reschedule your appointment, please contact [facility]  Dept: [phone]  and follow the prompts.  Office hours are 8:00 a.m. to 4:30 p.m. Monday - Friday. Please note that voicemails left after 4:00 p.m. may not be returned until the following business day.  We are closed weekends and major holidays. You have access to a nurse at all times for urgent questions. Please call the main number to the clinic Dept: [phone] and follow the prompts.   For any non-urgent questions, you may also contact your provider using MyChart. We now offer e-Visits for anyone 79 and older to request care online for non-urgent symptoms. For details visit [email or web address].   Also download the MyChart app! Go to the app store, search "MyChart", open the app, select [facility], and log in with your MyChart username and password.  Due to Covid, a mask is required upon entering the hospital/clinic. If you do not have a mask, one will be given to you upon arrival. For doctor visits, patients may have 1 support person aged 14 or older with them. For treatment visits, patients cannot have anyone with them due to current Covid guidelines and our immunocompromised population.

## 2020-10-22 ENCOUNTER — Inpatient Hospital Stay: Payer: Medicare HMO

## 2020-10-22 ENCOUNTER — Ambulatory Visit (HOSPITAL_COMMUNITY)
Admission: RE | Admit: 2020-10-22 | Discharge: 2020-10-22 | Disposition: A | Discharge status: Home / Self Care | Payer: Medicare HMO | Source: Ambulatory Visit | Attending: Internal Medicine | Admitting: Internal Medicine

## 2020-10-22 ENCOUNTER — Encounter (HOSPITAL_COMMUNITY): Payer: Self-pay

## 2020-10-22 DIAGNOSIS — C787 Secondary malignant neoplasm of liver and intrahepatic bile duct: Secondary | ICD-10-CM | POA: Insufficient documentation

## 2020-10-22 DIAGNOSIS — C3490 Malignant neoplasm of unspecified part of unspecified bronchus or lung: Secondary | ICD-10-CM | POA: Insufficient documentation

## 2020-10-22 IMAGING — CT CT ABDOMEN W/ CM
3 of 5 series · 16 of 46 positions shown, 18 images · IV contrast (omnipaque)
Comparison: PET-CT [DATE].  Report from CT chest [DATE].

CLINICAL DATA: Primary Cancer Type: Lung
TECHNIQUE: Multidetector CT imaging of the chest was performed during
intravenous contrast administration.

CONTRAST:  80mL OMNIPAQUE IOHEXOL 350 MG/ML SOLN

[Series 2: ca with · axial · 0.80mm/px · z∈[-436,-61]mm · 9 of 95 slices shown, 11 images]
[im 10/95  soft-tissue]
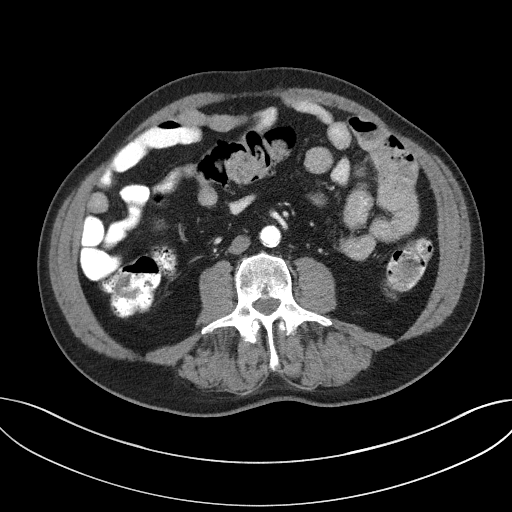
[im 10/95  bone]
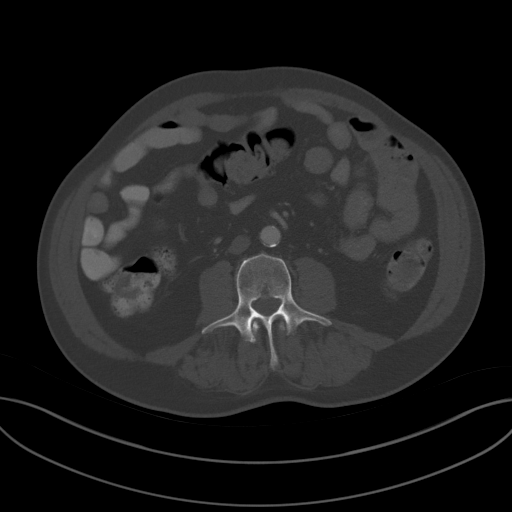
[im 19/95  soft-tissue]
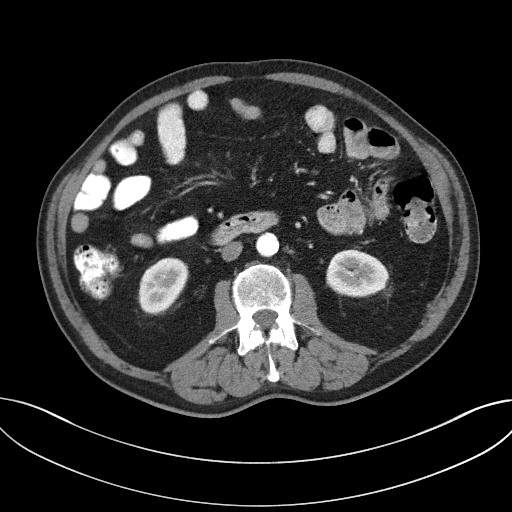
[im 29/95  soft-tissue]
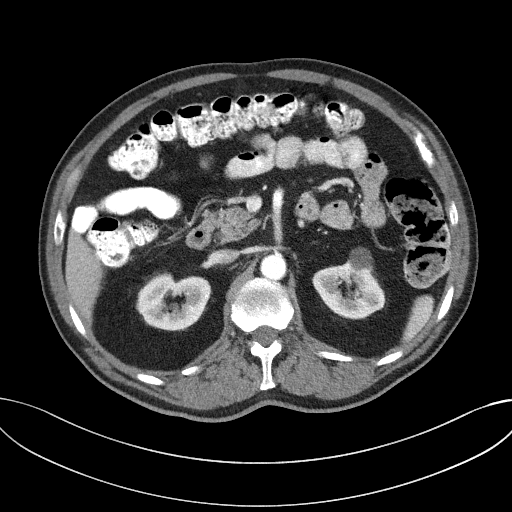
[im 38/95  soft-tissue]
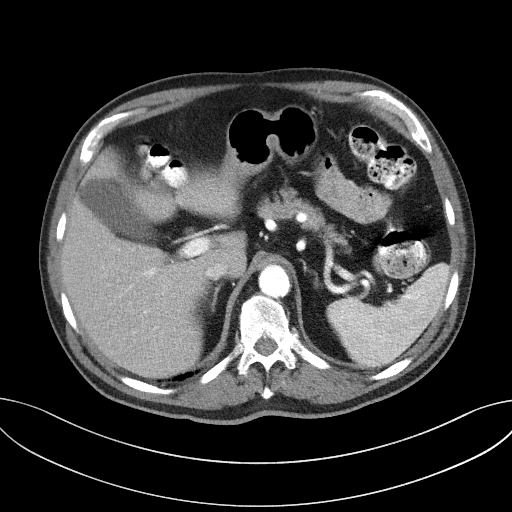
[im 48/95  soft-tissue]
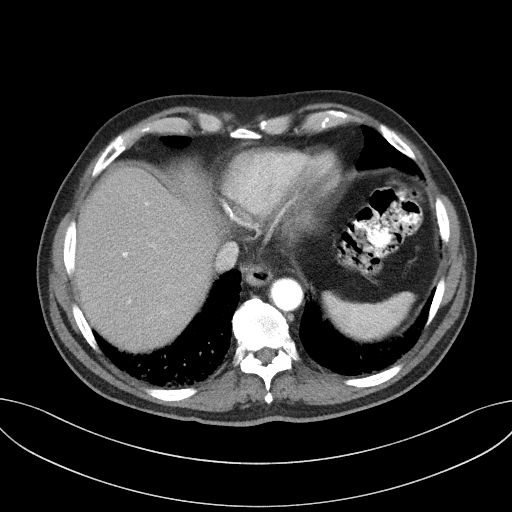
[im 57/95  soft-tissue]
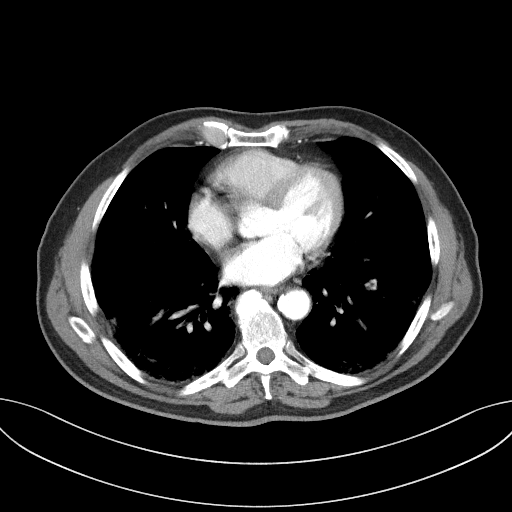
[im 66/95  soft-tissue]
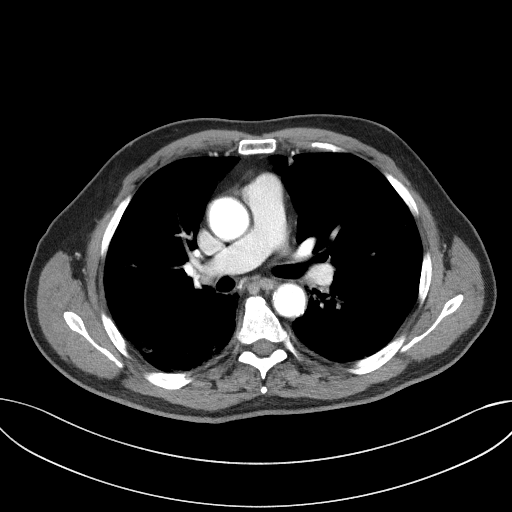
[im 76/95  soft-tissue]
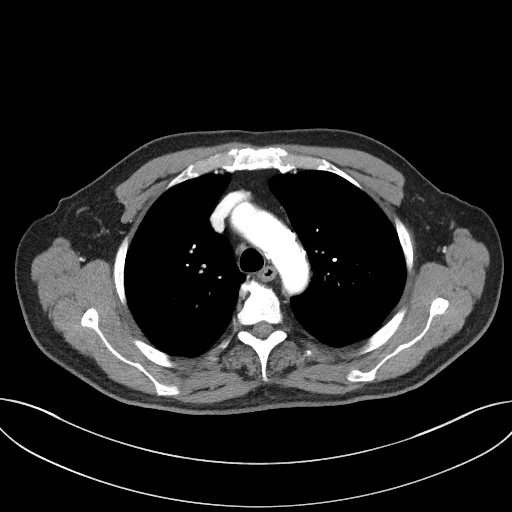
[im 85/95  soft-tissue]
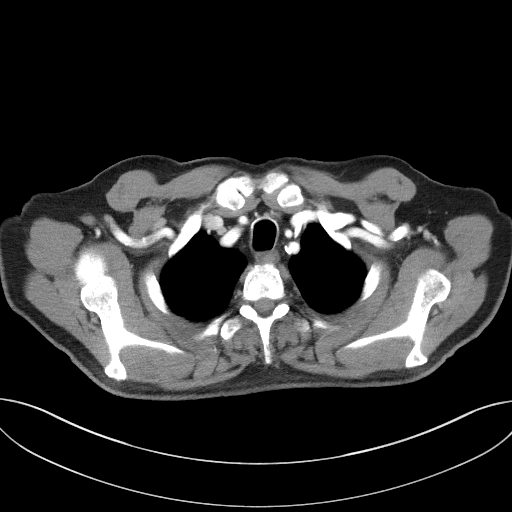
[im 85/95  bone]
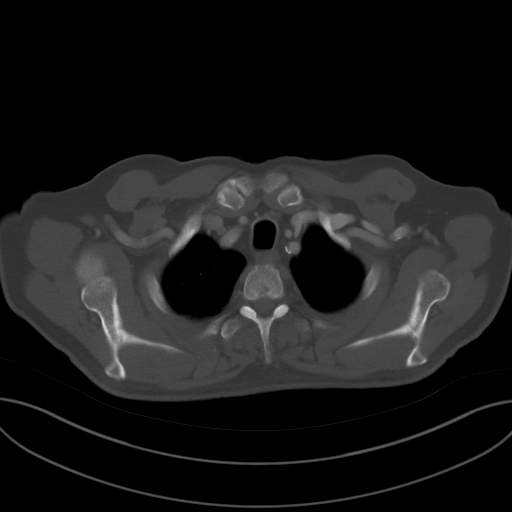

[Series 4: lung · axial · 0.80mm/px · z∈[-314,-198]mm · 4 of 156 slices shown]
[im 10/156  bone]
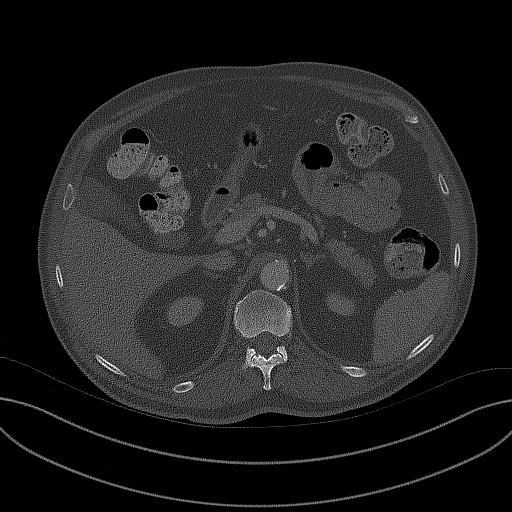
[im 30/156  bone]
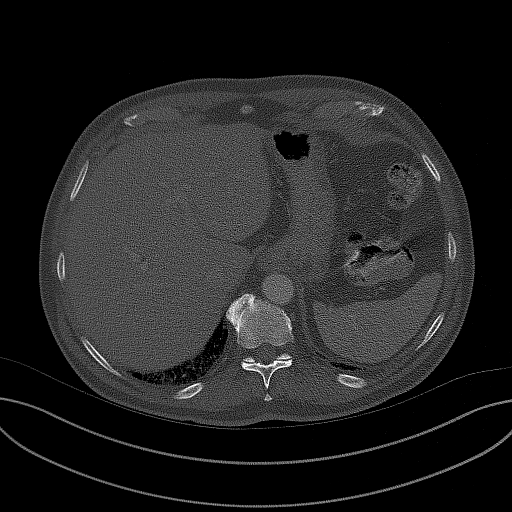
[im 49/156  bone]
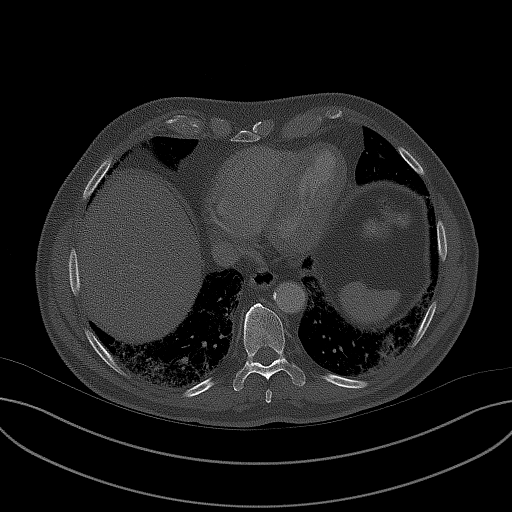
[im 68/156  bone]
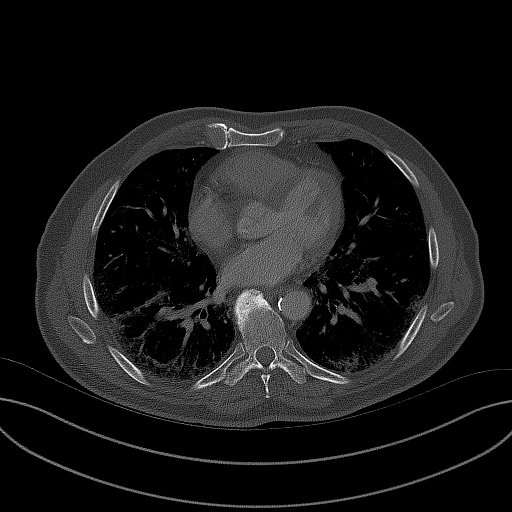

[Series 5: coronals · coronal · 0.92mm/px · 3 of 142 slices shown]
[im 48/142  soft-tissue]
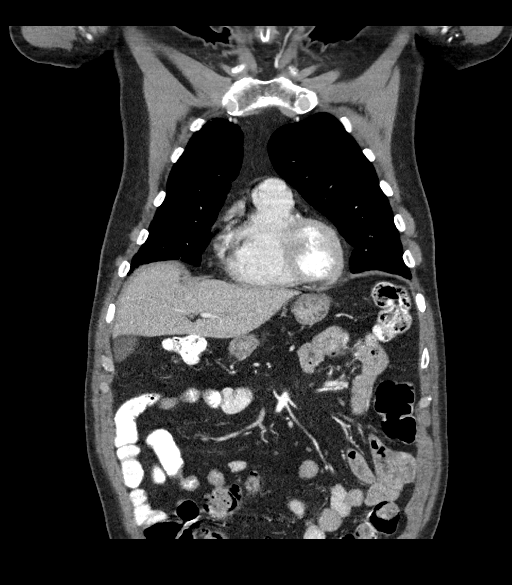
[im 63/142  soft-tissue]
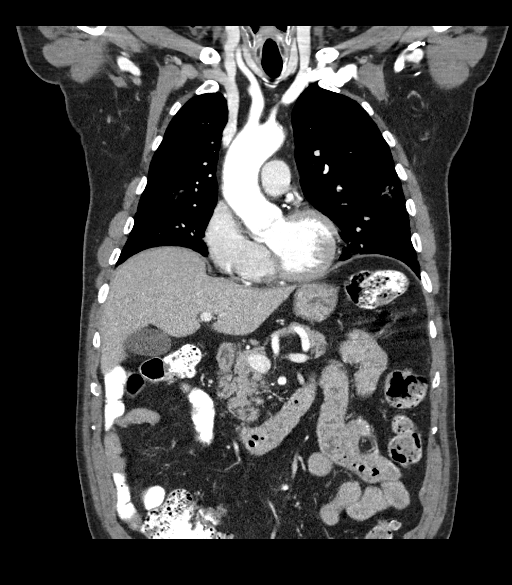
[im 79/142  soft-tissue]
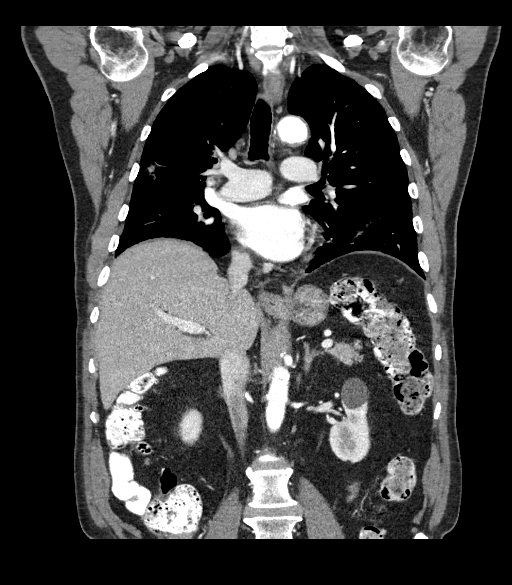

[16 of 46 positions shown; findings below may reference images not displayed]

Imaging Indication: Assess response to therapy

Interval therapy since last imaging? Yes

Initial Cancer Diagnosis

Date: [DATE]; Established by: Biopsy-proven

Detailed Pathology: Stage IV non-small cell lung cancer,
adenocarcinoma.

Primary Tumor location: Left upper lobe.

Surgeries: No

Chemotherapy: Yes; Ongoing? Yes; Most recent administration:
[DATE]

Immunotherapy?  Yes; Type: Keytruda; Ongoing? Yes

Radiation therapy? Yes; Date Range: [DATE]; Target: SRS to the
metastatic brain lesion

EXAM:
CT CHEST AND ABDOMEN WITH CONTRAST
FINDINGS: Mediastinum: No axillary or supraclavicular adenopathy. No
mediastinal or hilar adenopathy. No pericardial fluid. Esophagus
normal.

Small subcarinal nodes measure less than 10 mm. AP window adenopathy
not identified.

Coronary artery calcification and aortic atherosclerotic
calcification.

Lungs / pleura: LEFT upper lobe pulmonary nodule is decreased in
size measuring 9 mm (image 69/CT series 4) compared with 15 mm.

There is new patchy peripheral ground-glass nodularity within both
lungs.

Interval resolution of previous seen RIGHT effusion LEFT effusion.

Chest wall: Unremarkable

Musculoskeletal: No acute osseous abnormality.

Hepatobiliary: No focal hepatic lesion.  Gallbladder normal.

Pancreas: Normal pancreatic parenchymal intensity. No ductal
dilatation or inflammation.

Spleen: Normal spleen.

Adrenals/urinary tract: Adrenal glands normal. Benign cyst of the
LEFT kidney.

Stomach/Bowel: Stomach and limited of the small bowel is
unremarkable

Vascular/Lymphatic: Abdominal aortic normal caliber. No
retroperitoneal periportal lymphadenopathy.

Musculoskeletal: No aggressive osseous lesion
IMPRESSION: 1. Interval reduction in size of LEFT upper lobe pulmonary nodule.
2. No evidence of mediastinal lymphadenopathy.
3. New bilateral peripheral ground-glass nodularity. Differential
include atypical infection (including COVID viral pneumonia) versus
drug reaction.
4. Resolution LEFT pleural effusion.

These results will be called to the ordering clinician or
representative by the Radiologist Assistant, and communication
documented in the PACS or [REDACTED].

## 2020-10-22 MED ORDER — IOHEXOL 350 MG/ML SOLN
80.0000 mL | Freq: Once | INTRAVENOUS | Status: AC | PRN
  Administered 2020-10-22: 80 mL via INTRAVENOUS

## 2020-10-23 ENCOUNTER — Telehealth: Payer: Self-pay

## 2020-10-23 ENCOUNTER — Telehealth: Payer: Self-pay | Admitting: Physician Assistant

## 2020-10-23 NOTE — Telephone Encounter (Signed)
Call report received from [facility] regarding pts  10/22/20 CT abdomen/Chest results. Impression 3 highlighted:   "3. New bilateral peripheral ground-glass nodularity. Differential include atypical infection (including COVID viral pneumonia) versus drug reaction".  Results have been given to Dr. Julien Nordmann.

## 2020-10-23 NOTE — Telephone Encounter (Signed)
I called the patient to review his scan results as he was seen earlier this week and his next appointment is not for another 3 weeks.  Overall, from a cancer standpoint, the scan shows improvement in his condition.  The patient was recently diagnosed with COVID-19 and not surprisingly, the patient scan noted some new bilateral groundglass nodularity in which the differential included COVID-19 viral pneumonia.  The patient expressed understanding with the results and we will see him as planned in 3 weeks.

## 2020-10-29 ENCOUNTER — Other Ambulatory Visit: Payer: Self-pay

## 2020-10-29 ENCOUNTER — Inpatient Hospital Stay: Payer: Medicare HMO

## 2020-10-29 DIAGNOSIS — Z5112 Encounter for antineoplastic immunotherapy: Secondary | ICD-10-CM | POA: Diagnosis not present

## 2020-10-29 DIAGNOSIS — C3492 Malignant neoplasm of unspecified part of left bronchus or lung: Secondary | ICD-10-CM

## 2020-10-29 LAB — CMP (CANCER CENTER ONLY)
ALT: 18 U/L (ref 0–44)
AST: 21 U/L (ref 15–41)
Albumin: 3.5 g/dL (ref 3.5–5.0)
Alkaline Phosphatase: 101 U/L (ref 38–126)
Anion gap: 11 (ref 5–15)
BUN: 14 mg/dL (ref 8–23)
CO2: 25 mmol/L (ref 22–32)
Calcium: 9.5 mg/dL (ref 8.9–10.3)
Chloride: 106 mmol/L (ref 98–111)
Creatinine: 0.86 mg/dL (ref 0.61–1.24)
GFR, Estimated: >60 mL/min (ref >60)
Glucose, Bld: 110 mg/dL — ABNORMAL HIGH (ref 70–99)
Potassium: 4.3 mmol/L (ref 3.5–5.1)
Sodium: 142 mmol/L (ref 135–145)
Total Bilirubin: 0.4 mg/dL (ref 0.3–1.2)
Total Protein: 6.6 g/dL (ref 6.5–8.1)

## 2020-10-29 LAB — CBC WITH DIFFERENTIAL (CANCER CENTER ONLY)
Abs Immature Granulocytes: 0.01 10*3/uL (ref 0.00–0.07)
Basophils Absolute: 0 10*3/uL (ref 0.0–0.1)
Basophils Relative: 0 %
Eosinophils Absolute: 0.2 10*3/uL (ref 0.0–0.5)
Eosinophils Relative: 6 %
HCT: 25.8 % — ABNORMAL LOW (ref 39.0–52.0)
Hemoglobin: 8.9 g/dL — ABNORMAL LOW (ref 13.0–17.0)
Immature Granulocytes: 0 %
Lymphocytes Relative: 42 %
Lymphs Abs: 1 10*3/uL (ref 0.7–4.0)
MCH: 32 pg (ref 26.0–34.0)
MCHC: 34.5 g/dL (ref 30.0–36.0)
MCV: 92.8 fL (ref 80.0–100.0)
Monocytes Absolute: 0.4 10*3/uL (ref 0.1–1.0)
Monocytes Relative: 16 %
Neutro Abs: 0.9 10*3/uL — ABNORMAL LOW (ref 1.7–7.7)
Neutrophils Relative %: 36 %
Platelet Count: 101 10*3/uL — ABNORMAL LOW (ref 150–400)
RBC: 2.78 MIL/uL — ABNORMAL LOW (ref 4.22–5.81)
RDW: 14 % (ref 11.5–15.5)
WBC Count: 2.4 10*3/uL — ABNORMAL LOW (ref 4.0–10.5)
nRBC: 0 % (ref 0.0–0.2)

## 2020-10-30 ENCOUNTER — Telehealth: Payer: Self-pay | Admitting: Medical Oncology

## 2020-10-30 NOTE — Telephone Encounter (Signed)
Pt reports R arm is painful , swollen and red . He thinks it is from an IV site in R forearm placed by CT.  He went to urgent care and was told it was inflammation and to take ibuprofen and use warm compresses .  I told wife to call us if the arm does not get better.

## 2020-11-05 ENCOUNTER — Other Ambulatory Visit: Payer: Self-pay

## 2020-11-05 ENCOUNTER — Inpatient Hospital Stay: Payer: Medicare HMO | Attending: Internal Medicine

## 2020-11-05 DIAGNOSIS — J910 Malignant pleural effusion: Secondary | ICD-10-CM | POA: Insufficient documentation

## 2020-11-05 DIAGNOSIS — C3412 Malignant neoplasm of upper lobe, left bronchus or lung: Secondary | ICD-10-CM | POA: Insufficient documentation

## 2020-11-05 DIAGNOSIS — C3492 Malignant neoplasm of unspecified part of left bronchus or lung: Secondary | ICD-10-CM

## 2020-11-05 DIAGNOSIS — Z79899 Other long term (current) drug therapy: Secondary | ICD-10-CM | POA: Insufficient documentation

## 2020-11-05 DIAGNOSIS — C782 Secondary malignant neoplasm of pleura: Secondary | ICD-10-CM | POA: Insufficient documentation

## 2020-11-05 DIAGNOSIS — C7931 Secondary malignant neoplasm of brain: Secondary | ICD-10-CM | POA: Diagnosis not present

## 2020-11-05 DIAGNOSIS — Z5112 Encounter for antineoplastic immunotherapy: Secondary | ICD-10-CM | POA: Diagnosis present

## 2020-11-05 DIAGNOSIS — Z5111 Encounter for antineoplastic chemotherapy: Secondary | ICD-10-CM | POA: Insufficient documentation

## 2020-11-05 LAB — CBC WITH DIFFERENTIAL (CANCER CENTER ONLY)
Abs Immature Granulocytes: 0.02 10*3/uL (ref 0.00–0.07)
Basophils Absolute: 0 10*3/uL (ref 0.0–0.1)
Basophils Relative: 0 %
Eosinophils Absolute: 0.3 10*3/uL (ref 0.0–0.5)
Eosinophils Relative: 8 %
HCT: 28 % — ABNORMAL LOW (ref 39.0–52.0)
Hemoglobin: 9.4 g/dL — ABNORMAL LOW (ref 13.0–17.0)
Immature Granulocytes: 1 %
Lymphocytes Relative: 33 %
Lymphs Abs: 1.1 10*3/uL (ref 0.7–4.0)
MCH: 32.3 pg (ref 26.0–34.0)
MCHC: 33.6 g/dL (ref 30.0–36.0)
MCV: 96.2 fL (ref 80.0–100.0)
Monocytes Absolute: 0.8 10*3/uL (ref 0.1–1.0)
Monocytes Relative: 22 %
Neutro Abs: 1.2 10*3/uL — ABNORMAL LOW (ref 1.7–7.7)
Neutrophils Relative %: 36 %
Platelet Count: 134 10*3/uL — ABNORMAL LOW (ref 150–400)
RBC: 2.91 MIL/uL — ABNORMAL LOW (ref 4.22–5.81)
RDW: 16.2 % — ABNORMAL HIGH (ref 11.5–15.5)
WBC Count: 3.4 10*3/uL — ABNORMAL LOW (ref 4.0–10.5)
nRBC: 0 % (ref 0.0–0.2)

## 2020-11-05 LAB — CMP (CANCER CENTER ONLY)
ALT: 14 U/L (ref 0–44)
AST: 19 U/L (ref 15–41)
Albumin: 3.6 g/dL (ref 3.5–5.0)
Alkaline Phosphatase: 96 U/L (ref 38–126)
Anion gap: 10 (ref 5–15)
BUN: 12 mg/dL (ref 8–23)
CO2: 25 mmol/L (ref 22–32)
Calcium: 9.4 mg/dL (ref 8.9–10.3)
Chloride: 106 mmol/L (ref 98–111)
Creatinine: 0.96 mg/dL (ref 0.61–1.24)
GFR, Estimated: >60 mL/min (ref >60)
Glucose, Bld: 124 mg/dL — ABNORMAL HIGH (ref 70–99)
Potassium: 4.3 mmol/L (ref 3.5–5.1)
Sodium: 141 mmol/L (ref 135–145)
Total Bilirubin: 0.4 mg/dL (ref 0.3–1.2)
Total Protein: 6.7 g/dL (ref 6.5–8.1)

## 2020-11-05 LAB — TSH: TSH: 4.259 u[IU]/mL — ABNORMAL HIGH (ref 0.320–4.118)

## 2020-11-06 ENCOUNTER — Ambulatory Visit: Payer: Medicare HMO

## 2020-11-06 ENCOUNTER — Other Ambulatory Visit: Payer: Medicare HMO

## 2020-11-06 ENCOUNTER — Ambulatory Visit: Payer: Medicare HMO | Admitting: Internal Medicine

## 2020-11-07 ENCOUNTER — Other Ambulatory Visit: Payer: Self-pay | Admitting: Physician Assistant

## 2020-11-07 DIAGNOSIS — C3492 Malignant neoplasm of unspecified part of left bronchus or lung: Secondary | ICD-10-CM

## 2020-11-10 ENCOUNTER — Inpatient Hospital Stay: Payer: Medicare HMO

## 2020-11-10 ENCOUNTER — Inpatient Hospital Stay (HOSPITAL_BASED_OUTPATIENT_CLINIC_OR_DEPARTMENT_OTHER): Payer: Medicare HMO | Admitting: Internal Medicine

## 2020-11-10 ENCOUNTER — Other Ambulatory Visit: Payer: Self-pay

## 2020-11-10 ENCOUNTER — Encounter: Payer: Self-pay | Admitting: Internal Medicine

## 2020-11-10 VITALS — BP 122/62 | HR 84 | Temp 97.4°F | Resp 19 | Ht 73.0 in | Wt 184.1 lb

## 2020-11-10 DIAGNOSIS — C3492 Malignant neoplasm of unspecified part of left bronchus or lung: Secondary | ICD-10-CM

## 2020-11-10 DIAGNOSIS — Z5111 Encounter for antineoplastic chemotherapy: Secondary | ICD-10-CM

## 2020-11-10 DIAGNOSIS — Z5112 Encounter for antineoplastic immunotherapy: Secondary | ICD-10-CM | POA: Diagnosis not present

## 2020-11-10 LAB — CBC WITH DIFFERENTIAL (CANCER CENTER ONLY)
Abs Immature Granulocytes: 0.03 10*3/uL (ref 0.00–0.07)
Basophils Absolute: 0 10*3/uL (ref 0.0–0.1)
Basophils Relative: 1 %
Eosinophils Absolute: 0.2 10*3/uL (ref 0.0–0.5)
Eosinophils Relative: 5 %
HCT: 31.3 % — ABNORMAL LOW (ref 39.0–52.0)
Hemoglobin: 10.5 g/dL — ABNORMAL LOW (ref 13.0–17.0)
Immature Granulocytes: 1 %
Lymphocytes Relative: 29 %
Lymphs Abs: 1.4 10*3/uL (ref 0.7–4.0)
MCH: 32.7 pg (ref 26.0–34.0)
MCHC: 33.5 g/dL (ref 30.0–36.0)
MCV: 97.5 fL (ref 80.0–100.0)
Monocytes Absolute: 0.8 10*3/uL (ref 0.1–1.0)
Monocytes Relative: 17 %
Neutro Abs: 2.2 10*3/uL (ref 1.7–7.7)
Neutrophils Relative %: 47 %
Platelet Count: 186 10*3/uL (ref 150–400)
RBC: 3.21 MIL/uL — ABNORMAL LOW (ref 4.22–5.81)
RDW: 17.3 % — ABNORMAL HIGH (ref 11.5–15.5)
WBC Count: 4.6 10*3/uL (ref 4.0–10.5)
nRBC: 0 % (ref 0.0–0.2)

## 2020-11-10 LAB — CMP (CANCER CENTER ONLY)
ALT: 11 U/L (ref 0–44)
AST: 18 U/L (ref 15–41)
Albumin: 3.7 g/dL (ref 3.5–5.0)
Alkaline Phosphatase: 99 U/L (ref 38–126)
Anion gap: 9 (ref 5–15)
BUN: 13 mg/dL (ref 8–23)
CO2: 25 mmol/L (ref 22–32)
Calcium: 9.4 mg/dL (ref 8.9–10.3)
Chloride: 107 mmol/L (ref 98–111)
Creatinine: 0.91 mg/dL (ref 0.61–1.24)
GFR, Estimated: >60 mL/min (ref >60)
Glucose, Bld: 119 mg/dL — ABNORMAL HIGH (ref 70–99)
Potassium: 4.6 mmol/L (ref 3.5–5.1)
Sodium: 141 mmol/L (ref 135–145)
Total Bilirubin: 0.3 mg/dL (ref 0.3–1.2)
Total Protein: 6.8 g/dL (ref 6.5–8.1)

## 2020-11-10 LAB — TSH: TSH: 3.876 u[IU]/mL (ref 0.320–4.118)

## 2020-11-10 MED ORDER — SODIUM CHLORIDE 0.9 % IV SOLN: Freq: Once | INTRAVENOUS | Status: AC

## 2020-11-10 MED ORDER — SODIUM CHLORIDE 0.9 % IV SOLN
200.0000 mg | Freq: Once | INTRAVENOUS | Status: AC
  Administered 2020-11-10: 200 mg via INTRAVENOUS
  Filled 2020-11-10: qty 8

## 2020-11-10 MED ORDER — SODIUM CHLORIDE 0.9 % IV SOLN
500.0000 mg/m2 | Freq: Once | INTRAVENOUS | Status: AC
  Administered 2020-11-10: 1000 mg via INTRAVENOUS
  Filled 2020-11-10: qty 40

## 2020-11-10 MED ORDER — PROCHLORPERAZINE MALEATE 10 MG PO TABS
10.0000 mg | ORAL_TABLET | Freq: Once | ORAL | Status: AC
  Administered 2020-11-10: 10 mg via ORAL
  Filled 2020-11-10: qty 1

## 2020-11-10 NOTE — Progress Notes (Signed)
[facility] [phone]   [phone]  OFFICE PROGRESS NOTE  [name], MD [address] [place] [address]  DIAGNOSIS: Stage IV (T1b, N2, M1a) non-small cell lung cancer, adenocarcinoma diagnosed in July 2022 and presented with left upper lobe nodule in addition to AP window lymphadenopathy and left-sided malignant pleural effusion as well as pleural metastatic disease.  The patient also has solitary left temporal brain metastasis.  Biomarker Findings Microsatellite status - MS-Stable Tumor Mutational Burden - 4 Muts/Mb Genomic Findings For a complete list of the genes assayed, please refer to the Appendix. RCV8LF Y101B PTEN splice site 510-2H>E - subclonal? DOT1L S911L - subclonal? RAD21 S271* RB1 loss exons 3-23 TP53 N349f*34 8 Disease relevant genes with no reportable alterations: ALK, BRAF, EGFR, ERBB2, KRAS, MET, RET, ROS1  PRIOR THERAPY: None  CURRENT THERAPY: Systemic chemotherapy with carboplatin for AUC of 5, Alimta 500 Mg/M2 and Keytruda 200 Mg IV every 3 weeks.  First dose August 13, 2020.  Status post 4 cycles.  INTERVAL HISTORY: Peter Rivkin89y.o. male returns to the clinic today for follow-up visit accompanied by his wife.  The patient is feeling fine today with no concerning complaints except for fatigue.  He had COVID-19 infection on October 08, 2020 and it took him several weeks to recover.  He denied having any current chest pain but has shortness of breath with exertion with mild cough and no hemoptysis.  He denied having any fever or chills.  He has no nausea, vomiting, diarrhea or constipation.  He has no headache or visual changes.  He had repeat CT scan of the chest performed recently and he is here for evaluation and discussion of his scan results before starting cycle #5 of his treatment.    MEDICAL HISTORY: Past Medical History:  Diagnosis Date   Lung cancer ([facility]    L[facility] metastatic [facility] brain  [facility]     ALLERGIES:  has no active allergies.  MEDICATIONS:  Current Outpatient Medications  Medication Sig Dispense Refill   aspirin 81 MG EC tablet Take by mouth.     Bacillus Coagulans-Inulin (PROBIOTIC) 1-250 BILLION-MG CAPS Take 1 tablet by mouth daily.     Benzocaine-Menthol 15-3.6 MG LOZG Take by mouth.     benzonatate (TESSALON) 100 MG capsule Take 100 mg by mouth every 8 (eight) hours.     coal tar-salicylic acid 2 % shampoo Apply topically daily as needed for itching.     DULoxetine (CYMBALTA) 30 MG capsule Take 30 mg by mouth daily.     fluocinonide cream (LIDEX) 05.27% Apply 1 application topically 2 (two) times daily.     folic acid (FOLVITE) 1 MG tablet Take 1 tablet (1 mg total) by mouth daily. 30 tablet 4   Glucosamine-Chondroitin 250-200 MG TABS Take 1 tablet by mouth daily.     hydrocortisone cream 1 % Apply 1 application topically 2 (two) times daily. 453.6 g 0   HYDROMET 5-1.5 MG/5ML syrup Take 5 mLs by mouth every 6 (six) hours as needed.     ketoconazole (NIZORAL) 2 % cream Apply 1 application topically daily.     Krill Oil (OMEGA-3) 500 MG CAPS Take by mouth.     loratadine (CLARITIN) 10 MG tablet Take by mouth.     Magnesium 250 MG TABS Take by mouth.     omeprazole (PRILOSEC) 40 MG capsule Take 40 mg by mouth daily.     ondansetron (ZOFRAN-ODT) 4 MG  disintegrating tablet Take 4 mg by mouth every 8 (eight) hours as needed.     OVER THE COUNTER MEDICATION PNEUMOTROPHIN PMG     prochlorperazine (COMPAZINE) 10 MG tablet TAKE 1 TABLET(10 MG) BY MOUTH EVERY 6 HOURS AS NEEDED FOR NAUSEA OR VOMITING 30 tablet 0   triamcinolone cream (KENALOG) 0.1 % Apply 1 application topically daily as needed. 453 g 6   No current facility-administered medications for this visit.    SURGICAL HISTORY:  Past Surgical History:  Procedure Laterality Date   KNEE SURGERY     orthoscopic     TONSILLECTOMY AND ADENOIDECTOMY      REVIEW OF SYSTEMS:  Constitutional: positive for  fatigue Eyes: negative Ears, nose, mouth, throat, and face: negative Respiratory: positive for dyspnea on exertion Cardiovascular: negative Gastrointestinal: negative Genitourinary:negative Integument/breast: negative Hematologic/lymphatic: negative Musculoskeletal:negative Neurological: negative Behavioral/Psych: negative Endocrine: negative Allergic/Immunologic: negative   PHYSICAL EXAMINATION: General appearance: alert, cooperative, fatigued, and no distress Head: Normocephalic, without obvious abnormality, atraumatic Neck: no adenopathy, no JVD, supple, symmetrical, trachea midline, and thyroid not enlarged, symmetric, no tenderness/mass/nodules Lymph nodes: Cervical, supraclavicular, and axillary nodes normal. Resp: clear to auscultation bilaterally Back: symmetric, no curvature. ROM normal. No CVA tenderness. Cardio: regular rate and rhythm, S1, S2 normal, no murmur, click, rub or gallop GI: soft, non-tender; bowel sounds normal; no masses,  no organomegaly Extremities: extremities normal, atraumatic, no cyanosis or edema Neurologic: Alert and oriented X 3, normal strength and tone. Normal symmetric reflexes. Normal coordination and gait  ECOG PERFORMANCE STATUS: 1 - Symptomatic but completely ambulatory  Blood pressure 122/62, pulse 84, temperature (!) 97.4 F (36.3 C), temperature source Tympanic, resp. rate 19, height _0  (1.854 m), weight 184 lb 1.6 oz (83.5 kg), SpO2 98 %.  LABORATORY DATA: Lab Results  Component Value Date   WBC 4.6 11/10/2020   HGB 10.5 (L) 11/10/2020   HCT 31.3 (L) 11/10/2020   MCV 97.5 11/10/2020   PLT 186 11/10/2020      Chemistry      Component Value Date/Time   NA 141 11/05/2020 1057   K 4.3 11/05/2020 1057   CL 106 11/05/2020 1057   CO2 25 11/05/2020 1057   BUN 12 11/05/2020 1057   CREATININE 0.96 11/05/2020 1057      Component Value Date/Time   CALCIUM 9.4 11/05/2020 1057   ALKPHOS 96 11/05/2020 1057   AST 19 11/05/2020 1057    ALT 14 11/05/2020 1057   BILITOT 0.4 11/05/2020 1057       RADIOGRAPHIC STUDIES: CT Chest W Contrast  Result Date: 10/23/2020 CLINICAL DATA:  Primary Cancer Type: Lung Imaging Indication: Assess response to therapy Interval therapy since last imaging? Yes Initial Cancer Diagnosis Date: 07/21/2020; Established by: Biopsy-proven Detailed Pathology: Stage IV non-small cell lung cancer, adenocarcinoma. Primary Tumor location: Left upper lobe. Surgeries: No Chemotherapy: Yes; Ongoing? Yes; Most recent administration: 10/20/2020 Immunotherapy?  Yes; Type: Keytruda; Ongoing? Yes Radiation therapy? Yes; Date Range: 09/03/2020; Target: SRS to the metastatic brain lesion EXAM: CT CHEST AND ABDOMEN WITH CONTRAST TECHNIQUE: Multidetector CT imaging of the chest was performed during intravenous contrast administration. CONTRAST:  45m OMNIPAQUE IOHEXOL 350 MG/ML SOLN COMPARISON:  PET-CT 08/01/2020.  Report from CT chest 07/01/2020. FINDINGS: Mediastinum: No axillary or supraclavicular adenopathy. No mediastinal or hilar adenopathy. No pericardial fluid. Esophagus normal. Small subcarinal nodes measure less than 10 mm. AP window adenopathy not identified. Coronary artery calcification and aortic atherosclerotic calcification. Lungs / pleura: LEFT upper lobe pulmonary nodule is decreased in  size measuring 9 mm (image 69/CT series 4) compared with 15 mm. There is new patchy peripheral ground-glass nodularity within both lungs. Interval resolution of previous seen RIGHT effusion LEFT effusion. Chest wall: Unremarkable Musculoskeletal: No acute osseous abnormality. Hepatobiliary: No focal hepatic lesion.  Gallbladder normal. Pancreas: Normal pancreatic parenchymal intensity. No ductal dilatation or inflammation. Spleen: Normal spleen. Adrenals/urinary tract: Adrenal glands normal. Benign cyst of the LEFT kidney. Stomach/Bowel: Stomach and limited of the small bowel is unremarkable Vascular/Lymphatic: Abdominal aortic  normal caliber. No retroperitoneal periportal lymphadenopathy. Musculoskeletal: No aggressive osseous lesion IMPRESSION: 1. Interval reduction in size of LEFT upper lobe pulmonary nodule. 2. No evidence of mediastinal lymphadenopathy. 3. New bilateral peripheral ground-glass nodularity. Differential include atypical infection (including COVID viral pneumonia) versus drug reaction. 4. Resolution LEFT pleural effusion. These results will be called to the ordering clinician or representative by the Radiologist Assistant, and communication documented in the PACS or [employer]. Electronically Signed   By: Suzy Bouchard M.D.   On: 10/23/2020 10:51   CT Abdomen W Contrast  Result Date: 10/23/2020 CLINICAL DATA:  Primary Cancer Type: Lung Imaging Indication: Assess response to therapy Interval therapy since last imaging? Yes Initial Cancer Diagnosis Date: 07/21/2020; Established by: Biopsy-proven Detailed Pathology: Stage IV non-small cell lung cancer, adenocarcinoma. Primary Tumor location: Left upper lobe. Surgeries: No Chemotherapy: Yes; Ongoing? Yes; Most recent administration: 10/20/2020 Immunotherapy?  Yes; Type: Keytruda; Ongoing? Yes Radiation therapy? Yes; Date Range: 09/03/2020; Target: SRS to the metastatic brain lesion EXAM: CT CHEST AND ABDOMEN WITH CONTRAST TECHNIQUE: Multidetector CT imaging of the chest was performed during intravenous contrast administration. CONTRAST:  86m OMNIPAQUE IOHEXOL 350 MG/ML SOLN COMPARISON:  PET-CT 08/01/2020.  Report from CT chest 07/01/2020. FINDINGS: Mediastinum: No axillary or supraclavicular adenopathy. No mediastinal or hilar adenopathy. No pericardial fluid. Esophagus normal. Small subcarinal nodes measure less than 10 mm. AP window adenopathy not identified. Coronary artery calcification and aortic atherosclerotic calcification. Lungs / pleura: LEFT upper lobe pulmonary nodule is decreased in size measuring 9 mm (image 69/CT series 4) compared with 15 mm.  There is new patchy peripheral ground-glass nodularity within both lungs. Interval resolution of previous seen RIGHT effusion LEFT effusion. Chest wall: Unremarkable Musculoskeletal: No acute osseous abnormality. Hepatobiliary: No focal hepatic lesion.  Gallbladder normal. Pancreas: Normal pancreatic parenchymal intensity. No ductal dilatation or inflammation. Spleen: Normal spleen. Adrenals/urinary tract: Adrenal glands normal. Benign cyst of the LEFT kidney. Stomach/Bowel: Stomach and limited of the small bowel is unremarkable Vascular/Lymphatic: Abdominal aortic normal caliber. No retroperitoneal periportal lymphadenopathy. Musculoskeletal: No aggressive osseous lesion IMPRESSION: 1. Interval reduction in size of LEFT upper lobe pulmonary nodule. 2. No evidence of mediastinal lymphadenopathy. 3. New bilateral peripheral ground-glass nodularity. Differential include atypical infection (including COVID viral pneumonia) versus drug reaction. 4. Resolution LEFT pleural effusion. These results will be called to the ordering clinician or representative by the Radiologist Assistant, and communication documented in the PACS or [employer] Electronically Signed   By: SSuzy BouchardM.D.   On: 10/23/2020 10:51    ASSESSMENT AND PLAN: This is a very pleasant 80years old white male recently diagnosed with a stage IV (T1b, N2, M1 a) non-small cell lung cancer, adenocarcinoma presented with left upper lobe lung nodule in addition to mediastinal lymphadenopathy and pleural-based metastasis as well as malignant left pleural effusion diagnosed in July 2022. His molecular studies by foundation 1 showed no actionable mutations. The patient is currently undergoing systemic chemotherapy with carboplatin for AUC of 5, Alimta 500 Mg/M2 and  Keytruda 200 Mg IV every 3 weeks.  He is status post 4 cycles.  Starting from cycle #5 the patient will be on treatment with Alimta and Keytruda every 3 weeks. The patient has been  tolerating this treatment well with no concerning adverse effects except for fatigue that had increased after his COVID-19 infection. He had repeat CT scan of the chest performed recently.  I personally and independently reviewed the scans and discussed the results with the patient and his wife. Has a scan showed interval reduction in the size of the left upper lobe pulmonary nodule as well as resolution of the left pleural effusion but he has bilateral peripheral groundglass nodularity consistent with the COVID-19 infection. I recommended for the patient to continue his current treatment with maintenance Alimta and Keytruda starting from cycle #5. I will see him back for follow-up visit in 3 weeks for evaluation before the next cycle of his treatment. For IV access, I will refer the patient to IR for Port-A-Cath placement. The patient was advised to call immediately if he has any concerning symptoms in the interval. The patient voices understanding of current disease status and treatment options and is in agreement with the current care plan.  All questions were answered. The patient knows to call the clinic with any problems, questions or concerns. We can certainly see the patient much sooner if necessary.  Disclaimer: This note was dictated with voice recognition software. Similar sounding words can inadvertently be transcribed and may not be corrected upon review.

## 2020-11-10 NOTE — Patient Instructions (Signed)
[facility]  Discharge Instructions: Thank you for choosing [facility] to provide your oncology and hematology care.   If you have a lab appointment with the [facility], please go directly to the [facility] and check in at the registration area.   Wear comfortable clothing and clothing appropriate for easy access to any Portacath or PICC line.   We strive to give you quality time with your provider. You may need to reschedule your appointment if you arrive late (15 or more minutes).  Arriving late affects you and other patients whose appointments are after yours.  Also, if you miss three or more appointments without notifying the office, you may be dismissed from the clinic at the provider's discretion.      For prescription refill requests, have your pharmacy contact our office and allow 72 hours for refills to be completed.    Today you received the following chemotherapy and/or immunotherapy agents: Pembrolizumab (Keytruda), and Pemetrexed (Alimta).   To help prevent nausea and vomiting after your treatment, we encourage you to take your nausea medication as directed.  BELOW ARE SYMPTOMS THAT SHOULD BE REPORTED IMMEDIATELY: *FEVER GREATER THAN 100.4 F (38 C) OR HIGHER *CHILLS OR SWEATING *NAUSEA AND VOMITING THAT IS NOT CONTROLLED WITH YOUR NAUSEA MEDICATION *UNUSUAL SHORTNESS OF BREATH *UNUSUAL BRUISING OR BLEEDING *URINARY PROBLEMS (pain or burning when urinating, or frequent urination) *BOWEL PROBLEMS (unusual diarrhea, constipation, pain near the anus) TENDERNESS IN MOUTH AND THROAT WITH OR WITHOUT PRESENCE OF ULCERS (sore throat, sores in mouth, or a toothache) UNUSUAL RASH, SWELLING OR PAIN  UNUSUAL VAGINAL DISCHARGE OR ITCHING   Items with * indicate a potential emergency and should be followed up as soon as possible or go to the Emergency Department if any problems should occur.  Please show the CHEMOTHERAPY ALERT CARD or  IMMUNOTHERAPY ALERT CARD at check-in to the Emergency Department and triage nurse.  Should you have questions after your visit or need to cancel or reschedule your appointment, please contact [facility]  Dept: [phone]  and follow the prompts.  Office hours are 8:00 a.m. to 4:30 p.m. Monday - Friday. Please note that voicemails left after 4:00 p.m. may not be returned until the following business day.  We are closed weekends and major holidays. You have access to a nurse at all times for urgent questions. Please call the main number to the clinic Dept: [phone] and follow the prompts.   For any non-urgent questions, you may also contact your provider using MyChart. We now offer e-Visits for anyone 50 and older to request care online for non-urgent symptoms. For details visit [email or web address].   Also download the MyChart app! Go to the app store, search "MyChart", open the app, select [facility], and log in with your MyChart username and password.  Due to Covid, a mask is required upon entering the hospital/clinic. If you do not have a mask, one will be given to you upon arrival. For doctor visits, patients may have 1 support person aged 87 or older with them. For treatment visits, patients cannot have anyone with them due to current Covid guidelines and our immunocompromised population.

## 2020-11-20 ENCOUNTER — Other Ambulatory Visit: Payer: Self-pay

## 2020-11-20 DIAGNOSIS — C7931 Secondary malignant neoplasm of brain: Secondary | ICD-10-CM

## 2020-11-21 ENCOUNTER — Ambulatory Visit: Payer: Medicare HMO | Admitting: Physician Assistant

## 2020-11-21 ENCOUNTER — Ambulatory Visit: Payer: Medicare HMO

## 2020-11-21 ENCOUNTER — Other Ambulatory Visit (HOSPITAL_COMMUNITY): Payer: Self-pay | Admitting: Physician Assistant

## 2020-11-21 ENCOUNTER — Other Ambulatory Visit: Payer: Medicare HMO

## 2020-11-23 ENCOUNTER — Other Ambulatory Visit: Payer: Self-pay | Admitting: Radiology

## 2020-11-24 ENCOUNTER — Ambulatory Visit (HOSPITAL_COMMUNITY)
Admission: RE | Admit: 2020-11-24 | Discharge: 2020-11-24 | Disposition: A | Discharge status: Home / Self Care | Payer: Medicare HMO | Source: Ambulatory Visit | Attending: Internal Medicine | Admitting: Internal Medicine

## 2020-11-24 ENCOUNTER — Other Ambulatory Visit: Payer: Self-pay

## 2020-11-24 ENCOUNTER — Encounter (HOSPITAL_COMMUNITY): Payer: Self-pay

## 2020-11-24 DIAGNOSIS — C7931 Secondary malignant neoplasm of brain: Secondary | ICD-10-CM | POA: Insufficient documentation

## 2020-11-24 DIAGNOSIS — C3492 Malignant neoplasm of unspecified part of left bronchus or lung: Secondary | ICD-10-CM | POA: Diagnosis present

## 2020-11-24 HISTORY — PX: IR IMAGING GUIDED PORT INSERTION: IMG5740

## 2020-11-24 IMAGING — US IR IMAGING GUIDED PORT INSERTION
1 series · 1 of 1 positions shown · non-contrast
Comparison: None.

INDICATION: 80-year-old male with history of left lung cancer requiring central
venous access for chemotherapy administration.

EXAM:
IMPLANTED PORT A CATH PLACEMENT WITH ULTRASOUND AND FLUOROSCOPIC
GUIDANCE

[Series 1: ir fluoro/shunt/fist · 1 of 1 slices shown]
[im 1/1]
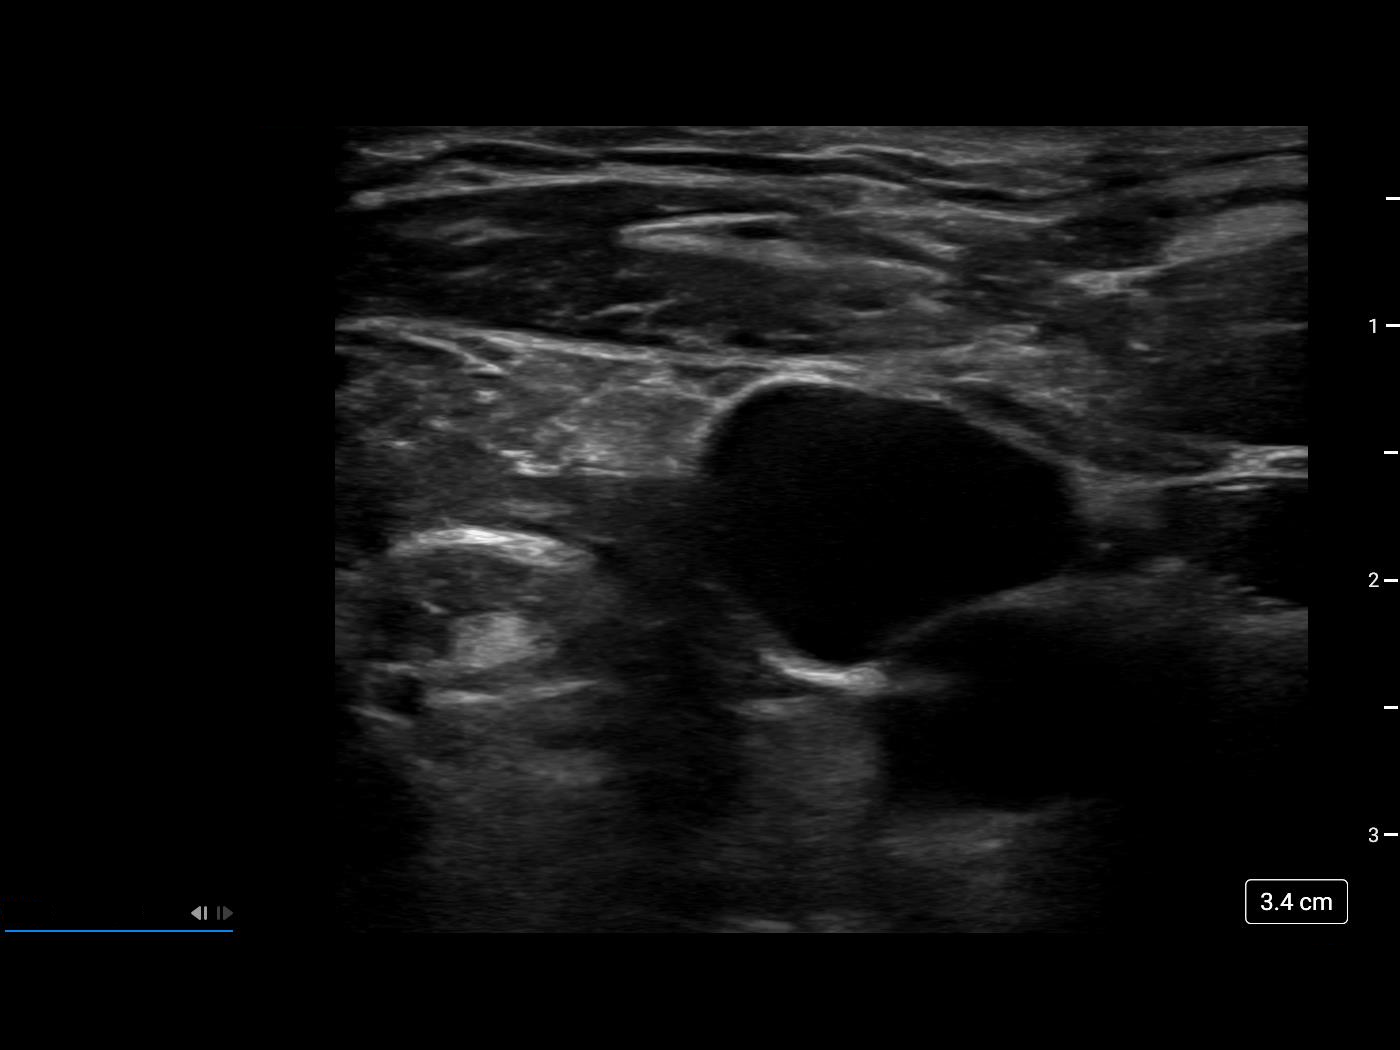

[1 of 1 positions shown; findings below may reference images not displayed]

MEDICATIONS:
None.

ANESTHESIA/SEDATION:
Moderate (conscious) sedation was employed during this procedure. A
total of Versed 2 mg and Fentanyl 50 mcg was administered
intravenously.

Moderate Sedation Time: 15 minutes. The patient's level of
consciousness and vital signs were monitored continuously by
radiology nursing throughout the procedure under my direct
supervision.

CONTRAST:  None

FLUOROSCOPY TIME:  0 minutes, 6 seconds (1 mGy)

COMPLICATIONS:
None immediate.

PROCEDURE:
The procedure, risks, benefits, and alternatives were explained to
the patient. Questions regarding the procedure were encouraged and
answered. The patient understands and consents to the procedure.

The right neck and chest were prepped with chlorhexidine in a
sterile fashion, and a sterile drape was applied covering the
operative field. Maximum barrier sterile technique with sterile
gowns and gloves were used for the procedure. A timeout was
performed prior to the initiation of the procedure.

Ultrasound was used to examine the jugular vein which was
compressible and free of internal echoes. A skin marker was used to
demarcate the planned venotomy and port pocket incision sites. Local
anesthesia was provided to these sites and the subcutaneous tunnel
track with 1% lidocaine with [DATE] epinephrine.

A small incision was created at the jugular access site and blunt
dissection was performed of the subcutaneous tissues. Under
ultrasound guidance, the jugular vein was accessed with a 21 ga
micropuncture needle and an 0.018" wire was inserted to the superior
vena cava. Real-time ultrasound guidance was utilized for vascular
access including the acquisition of a permanent ultrasound image
documenting patency of the accessed vessel. A 5 Fr micopuncture set
was then used, through which a 0.035" Rosen wire was passed under
fluoroscopic guidance into the inferior vena cava. An 8 Fr dilator
was then placed over the wire.

A subcutaneous port pocket was then created along the upper chest
wall utilizing a combination of sharp and blunt dissection. The
pocket was irrigated with sterile saline, packed with gauze, and
observed for hemorrhage. A single lumen "ISP" sized power injectable
port was chosen for placement. The 8 Fr catheter was tunneled from
the port pocket site to the venotomy incision. The port was placed
in the pocket. The external catheter was trimmed to appropriate
length. The dilator was exchanged for an 8 Fr peel-away sheath under
fluoroscopic guidance. The catheter was then placed through the
sheath and the sheath was removed. Final catheter positioning was
confirmed and documented with a fluoroscopic spot radiograph. The
port was accessed with USSENE needle, aspirated, and flushed with
heparinized saline.

The deep dermal layer of the port pocket incision was closed with
interrupted 3-0 Vicryl suture. Dermabond was then placed over the
port pocket and neck incisions. The patient tolerated the procedure
well without immediate post procedural complication.
FINDINGS: After catheter placement, the tip lies within the superior
cavoatrial junction. The catheter aspirates and flushes normally and
is ready for immediate use.
IMPRESSION: Successful placement of a power injectable Port-A-Cath via the right
internal jugular vein. The catheter is ready for immediate use.

## 2020-11-24 MED ORDER — LIDOCAINE-EPINEPHRINE (PF) 2 %-1:200000 IJ SOLN
INTRAMUSCULAR | Status: AC
  Filled 2020-11-24: qty 20

## 2020-11-24 MED ORDER — FENTANYL CITRATE (PF) 100 MCG/2ML IJ SOLN
INTRAMUSCULAR | Status: AC | PRN
  Administered 2020-11-24: 50 ug via INTRAVENOUS

## 2020-11-24 MED ORDER — HEPARIN SOD (PORK) LOCK FLUSH 100 UNIT/ML IV SOLN
INTRAVENOUS | Status: AC
  Filled 2020-11-24: qty 5

## 2020-11-24 MED ORDER — SODIUM CHLORIDE 0.9 % IV SOLN: INTRAVENOUS | Status: DC

## 2020-11-24 MED ORDER — FENTANYL CITRATE (PF) 100 MCG/2ML IJ SOLN
INTRAMUSCULAR | Status: AC
  Filled 2020-11-24: qty 2

## 2020-11-24 MED ORDER — MIDAZOLAM HCL 2 MG/2ML IJ SOLN
INTRAMUSCULAR | Status: AC | PRN
  Administered 2020-11-24: .5 mg via INTRAVENOUS

## 2020-11-24 MED ORDER — MIDAZOLAM HCL 2 MG/2ML IJ SOLN
INTRAMUSCULAR | Status: AC
  Filled 2020-11-24: qty 2

## 2020-11-24 MED ORDER — MIDAZOLAM HCL 2 MG/2ML IJ SOLN
INTRAMUSCULAR | Status: AC | PRN
  Administered 2020-11-24: 1 mg via INTRAVENOUS

## 2020-11-24 MED ORDER — HEPARIN SOD (PORK) LOCK FLUSH 100 UNIT/ML IV SOLN
INTRAVENOUS | Status: AC | PRN
  Administered 2020-11-24: 500 [IU] via INTRAVENOUS

## 2020-11-24 NOTE — Discharge Instructions (Signed)
Interventional radiology phone numbers °[phone] °After hours [phone] ° ° ° °You have skin glue (dermabond) over your new port. Do not use the lidocaine cream (EMLA cream) over the skin glue until it has healed. The petroleum in the lidocaine cream will dissolve the skin glue resulting in an infection of your new port. Use ice in a zip lock bag for 1-2 minutes over your new port before the cancer center nurses access your port. ° ° °Implanted Port Insertion, Care After °This sheet gives you information about how to care for yourself after your procedure. Your health care provider may also give you more specific instructions. If you have problems or questions, contact your health care provider. °What can I expect after the procedure? °After the procedure, it is common to have: °Discomfort at the port insertion site. °Bruising on the skin over the port. This should improve over 3-4 days. °Follow these instructions at home: °Port care °After your port is placed, you will get a manufacturer's information card. The card has information about your port. Keep this card with you at all times. °Take care of the port as told by your health care provider. Ask your health care provider if you or a family member can get training for taking care of the port at home. A home health care nurse may also take care of the port. °Make sure to remember what type of port you have. °Incision care °Follow instructions from your health care provider about how to take care of your port insertion site. Make sure you: °Wash your hands with soap and water before and after you change your bandage (dressing). If soap and water are not available, use hand sanitizer. °Change your dressing as told by your health care provider. °Leave skin glue in place. These skin closures may need to stay in place for 2 weeks or longer.  °Check your port insertion site every day for signs of infection. Check for: °Redness, swelling, or pain. °Fluid or  blood. °Warmth. °Pus or a bad smell.  °  °  °Activity °Return to your normal activities as told by your health care provider. Ask your health care provider what activities are safe for you. °Do not lift anything that is heavier than 10 lb (4.5 kg), or the limit that you are told, until your health care provider says that it is safe. °General instructions °Take over-the-counter and prescription medicines only as told by your health care provider. °Do not take baths, swim, or use a hot tub until your health care provider approves.You may remove your dressing tomorrow and shower 24 hours after your procedure. °Do not drive for 24 hours if you were given a sedative during your procedure. °Wear a medical alert bracelet in case of an emergency. This will tell any health care providers that you have a port. °Keep all follow-up visits as told by your health care provider. This is important. °Contact a health care provider if: °You cannot flush your port with saline as directed, or you cannot draw blood from the port. °You have a fever or chills. °You have redness, swelling, or pain around your port insertion site. °You have fluid or blood coming from your port insertion site. °Your port insertion site feels warm to the touch. °You have pus or a bad smell coming from the port insertion site. °Get help right away if: °You have chest pain or shortness of breath. °You have bleeding from your port that you cannot control. °Summary °Take care of   the port as told by your health care provider. Keep the manufacturer's information card with you at all times. °Change your dressing as told by your health care provider. °Contact a health care provider if you have a fever or chills or if you have redness, swelling, or pain around your port insertion site. °Keep all follow-up visits as told by your health care provider. °This information is not intended to replace advice given to you by your health care provider. Make sure you discuss any  questions you have with your health care provider. °Document Revised: 07/19/2017 Document Reviewed: 07/19/2017 °Elsevier Patient Education © 2021 [employer] ° ° ° °Moderate Conscious Sedation, Adult, Care After °This sheet gives you information about how to care for yourself after your procedure. Your health care provider may also give you more specific instructions. If you have problems or questions, contact your health care provider. °What can I expect after the procedure? °After the procedure, it is common to have: °Sleepiness for several hours. °Impaired judgment for several hours. °Difficulty with balance. °Vomiting if you eat too soon. °Follow these instructions at home: °For the time period you were told by your health care provider: °Rest. °Do not participate in activities where you could fall or become injured. °Do not drive or use machinery. °Do not drink alcohol. °Do not take sleeping pills or medicines that cause drowsiness. °Do not make important decisions or sign legal documents. °Do not take care of children on your own.  °  °  °Eating and drinking °Follow the diet recommended by your health care provider. °Drink enough fluid to keep your urine pale yellow. °If you vomit: °Drink water, juice, or soup when you can drink without vomiting. °Make sure you have little or no nausea before eating solid foods.   °General instructions °Take over-the-counter and prescription medicines only as told by your health care provider. °Have a responsible adult stay with you for the time you are told. It is important to have someone help care for you until you are awake and alert. °Do not smoke. °Keep all follow-up visits as told by your health care provider. This is important. °Contact a health care provider if: °You are still sleepy or having trouble with balance after 24 hours. °You feel light-headed. °You keep feeling nauseous or you keep vomiting. °You develop a rash. °You have a fever. °You have redness or  swelling around the IV site. °Get help right away if: °You have trouble breathing. °You have new-onset confusion at home. °Summary °After the procedure, it is common to feel sleepy, have impaired judgment, or feel nauseous if you eat too soon. °Rest after you get home. Know the things you should not do after the procedure. °Follow the diet recommended by your health care provider and drink enough fluid to keep your urine pale yellow. °Get help right away if you have trouble breathing or new-onset confusion at home. °This information is not intended to replace advice given to you by your health care provider. Make sure you discuss any questions you have with your health care provider. °Document Revised: 04/20/2019 Document Reviewed: 11/16/2018 °Elsevier Patient Education © 2021 [employer] Inc.  °

## 2020-11-24 NOTE — Procedures (Signed)
Interventional Radiology Procedure Note ° °Procedure: Single Lumen Power Port Placement   ° °Access:  Right internal jugular vein ° °Findings: Catheter tip positioned at cavoatrial junction. Port is ready for immediate use.  ° °Complications: None ° °EBL: < 10 mL ° °Recommendations:  °- Ok to shower in 24 hours °- Do not submerge for 7 days °- Routine line care  ° ° °Neyda Durango, MD ° ° ° °

## 2020-11-24 NOTE — H&P (Signed)
Chief Complaint: Patient was seen in consultation today for image guided Port-A-Cath placement  at the request of [facility]  Referring Physician(s): Mohamed,Mohamed  Supervising Physician: Ruthann Cancer  Patient Status: [facility] - Out-pt  History of Present Illness: [facility] is a 80 y.o. male with no significant medical history.  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  Patient has been referred today by Dr. Curt Bears for Port-A-Cath placement to start chemotherapy.  Past Medical History:  Diagnosis Date   Lung cancer ([place])    Lung cancer metastatic to brain [facility])     Past Surgical History:  Procedure Laterality Date   KNEE SURGERY     orthoscopic     TONSILLECTOMY AND ADENOIDECTOMY      Allergies: Patient has no active allergies.  Medications: Prior to Admission medications   Medication Sig Start Date End Date Taking? Authorizing Provider  aspirin 81 MG EC tablet Take by mouth.    [provider]  Bacillus Coagulans-Inulin (PROBIOTIC) 1-250 BILLION-MG CAPS Take 1 tablet by mouth daily.    [provider]  Benzocaine-Menthol 15-3.6 MG LOZG Take by mouth. 10/08/20   [provider]  benzonatate (TESSALON) 100 MG capsule Take 100 mg by mouth every 8 (eight) hours. 10/09/20   [provider]  bisacodyl (DULCOLAX) 5 MG EC tablet Take 5 mg by mouth 3 (three) times daily as needed for moderate constipation.    [provider]  coal tar-salicylic acid 2 % shampoo Apply topically daily as needed for itching.    [provider]  DULoxetine (CYMBALTA) 30 MG capsule Take 30 mg by mouth daily. 06/19/19   [provider]  fluocinonide cream (LIDEX) 6.71 % Apply 1 application topically 2 (two) times daily.    [provider]  folic acid (FOLVITE) 1 MG tablet Take 1 tablet (1 mg total) by mouth daily. 08/04/20   Curt Bears, MD   Glucosamine-Chondroitin 250-200 MG TABS Take 1 tablet by mouth daily.    [provider]  hydrocortisone cream 1 % Apply 1 application topically 2 (two) times daily. 09/04/20   Heilingoetter, Cassandra L, PA-C  HYDROMET 5-1.5 MG/5ML syrup Take 5 mLs by mouth every 6 (six) hours as needed. 10/09/20   [provider]  ketoconazole (NIZORAL) 2 % cream Apply 1 application topically daily.    [provider]  Astrid Drafts (OMEGA-3) 500 MG CAPS Take by mouth.    [provider]  loratadine (CLARITIN) 10 MG tablet Take by mouth.    [provider]  Magnesium 250 MG TABS Take by mouth.    [provider]  omeprazole (PRILOSEC) 40 MG capsule Take 40 mg by mouth daily.    [provider]  ondansetron (ZOFRAN-ODT) 4 MG disintegrating tablet Take 4 mg by mouth every 8 (eight) hours as needed. 10/09/20   [provider]  [place] Number Two PMG    [provider]  prochlorperazine (COMPAZINE) 10 MG tablet TAKE 1 TABLET(10 MG) BY MOUTH EVERY 6 HOURS AS NEEDED FOR NAUSEA OR VOMITING 08/18/20   Curt Bears, MD  triamcinolone cream (KENALOG) 0.1 % Apply 1 application topically daily as needed. 09/16/20   Warren Danes, PA-C     History reviewed. No pertinent family history.  Social History   Socioeconomic History   Marital status: Married    Spouse name: Not on file   Number of children: Not on file   Years  of education: Not on file   Highest education level: Not on file  Occupational History   Not on file  Tobacco Use   Smoking status: Never   Smokeless tobacco: Never  Substance and Sexual Activity   Alcohol use: Never   Drug use: Never   Sexual activity: Not on file  Other Topics Concern   Not on file  Social History Narrative   Not on file   Social Determinants of Health   Financial Resource Strain: Low Risk    Difficulty of Paying Living Expenses: Not very hard  Food Insecurity: No  Food Insecurity   Worried About Running Out of Food in the Last Year: Never true   Ran Out of Food in the Last Year: Never true  Transportation Needs: No Transportation Needs   Lack of Transportation (Medical): No   Lack of Transportation (Non-Medical): No  Physical Activity: Not on file  Stress: Stress Concern Present   Feeling of Stress : To some extent  Social Connections: Engineer, building services of Communication with Friends and Family: More than three times a week   Frequency of Social Gatherings with Friends and Family: Once a week   Attends Religious Services: More than 4 times per year   Active Member of [employer] or Organizations: Yes   Attends Music therapist: More than 4 times per year   Marital Status: Married     Review of Systems: A 12 point ROS discussed and pertinent positives are indicated in the HPI above.  All other systems are negative.  Review of Systems  Constitutional:  Negative for chills and fever.  HENT:  Negative for nosebleeds.   Eyes:  Negative for visual disturbance.  Respiratory:  Positive for shortness of breath. Negative for cough.   Cardiovascular:  Positive for chest pain. Negative for leg swelling.       Left side where lung tumors are located  Gastrointestinal:  Negative for abdominal pain, blood in stool, nausea and vomiting.  Genitourinary:  Negative for hematuria.  Neurological:  Negative for dizziness, light-headedness and headaches.   Vital Signs: BP 130/83   Pulse 70   Temp 98.3 F (36.8 C) (Oral)   Resp 18   SpO2 100%   Physical Exam Constitutional:      Appearance: Normal appearance. He is not ill-appearing.  HENT:     Head: Normocephalic and atraumatic.     Mouth/Throat:     Mouth: Mucous membranes are moist.     Pharynx: Oropharynx is clear.  Eyes:     Pupils: Pupils are equal, round, and reactive to light.  Cardiovascular:     Rate and Rhythm: Normal rate and regular rhythm.     Pulses: Normal  pulses.     Heart sounds: Normal heart sounds. No murmur heard.   No friction rub. No gallop.  Pulmonary:     Effort: Pulmonary effort is normal. No respiratory distress.     Breath sounds: No stridor. No wheezing, rhonchi or rales.     Comments: Diminished left upper/lower lobes Abdominal:     General: Bowel sounds are normal.     Palpations: Abdomen is soft.     Tenderness: There is no abdominal tenderness. There is no guarding.  Musculoskeletal:     Right lower leg: No edema.     Left lower leg: No edema.  Skin:    General: Skin is warm and dry.  Neurological:     Mental Status: He is alert  and oriented to person, place, and time.  Psychiatric:        Mood and Affect: Mood normal.        Behavior: Behavior normal.        Thought Content: Thought content normal.        Judgment: Judgment normal.    Imaging: No results found.  Labs:  CBC: Recent Labs    10/20/20 1326 10/29/20 0856 11/05/20 1057 11/10/20 0827  WBC 6.1 2.4* 3.4* 4.6  HGB 10.8* 8.9* 9.4* 10.5*  HCT 32.1* 25.8* 28.0* 31.3*  PLT 244 101* 134* 186    COAGS: No results for input(s): INR, APTT in the last 8760 hours.  BMP: Recent Labs    10/20/20 1326 10/29/20 0856 11/05/20 1057 11/10/20 0827  NA 143 142 141 141  K 4.5 4.3 4.3 4.6  CL 106 106 106 107  CO2 25 25 25 25   GLUCOSE 92 110* 124* 119*  BUN 14 14 12 13   CALCIUM 10.1 9.5 9.4 9.4  CREATININE 0.98 0.86 0.96 0.91  GFRNONAA >60 >60 >60 >60    LIVER FUNCTION TESTS: Recent Labs    10/20/20 1326 10/29/20 0856 11/05/20 1057 11/10/20 0827  BILITOT 0.3 0.4 0.4 0.3  AST 18 21 19 18   ALT 13 18 14 11   ALKPHOS 95 101 96 99  PROT 7.4 6.6 6.7 6.8  ALBUMIN 3.6 3.5 3.6 3.7    TUMOR MARKERS: No results for input(s): AFPTM, CEA, CA199, CHROMGRNA in the last 8760 hours.  Assessment and Plan: Patient with no significant medical history.  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   Patient has been referred today by Dr. Curt Bears for Port-A-Cath placement to start chemotherapy.  Pt resting quietly on stretcher. He is is A&O, calm and pleasant.  He is in no distress.  Pt states he is NPO per order.  No labs needed for today. VSS.   Risks and benefits of image guided port-a-catheter placement was discussed with the patient including, but not limited to bleeding, infection, pneumothorax, or fibrin sheath development and need for additional procedures.  All of the patient's questions were answered, patient is agreeable to proceed. Consent signed and in chart.   Thank you for this interesting consult.  I greatly enjoyed meeting Arianna Delsanto and look forward to participating in their care.  A copy of this report was sent to the requesting provider on this date.  Electronically Signed: Tyson Alias, NP 11/24/2020, 2:44 PM   I spent a total of 30 minutes in face to face in clinical consultation, greater than 50% of which was counseling/coordinating care image guided Port-A-Cath placement.

## 2020-11-26 ENCOUNTER — Ambulatory Visit: Payer: Medicare HMO | Admitting: Internal Medicine

## 2020-11-26 ENCOUNTER — Ambulatory Visit: Payer: Medicare HMO

## 2020-11-26 ENCOUNTER — Other Ambulatory Visit: Payer: Medicare HMO

## 2020-12-01 ENCOUNTER — Inpatient Hospital Stay: Payer: Medicare HMO

## 2020-12-01 ENCOUNTER — Encounter: Payer: Self-pay | Admitting: Internal Medicine

## 2020-12-01 ENCOUNTER — Other Ambulatory Visit: Payer: Self-pay

## 2020-12-01 ENCOUNTER — Inpatient Hospital Stay (HOSPITAL_BASED_OUTPATIENT_CLINIC_OR_DEPARTMENT_OTHER): Payer: Medicare HMO | Admitting: Internal Medicine

## 2020-12-01 VITALS — BP 126/73 | HR 74 | Temp 97.6°F | Resp 19 | Ht 73.0 in | Wt 183.2 lb

## 2020-12-01 DIAGNOSIS — C3492 Malignant neoplasm of unspecified part of left bronchus or lung: Secondary | ICD-10-CM

## 2020-12-01 DIAGNOSIS — C7931 Secondary malignant neoplasm of brain: Secondary | ICD-10-CM | POA: Diagnosis not present

## 2020-12-01 DIAGNOSIS — Z5112 Encounter for antineoplastic immunotherapy: Secondary | ICD-10-CM

## 2020-12-01 DIAGNOSIS — Z5111 Encounter for antineoplastic chemotherapy: Secondary | ICD-10-CM

## 2020-12-01 DIAGNOSIS — C3490 Malignant neoplasm of unspecified part of unspecified bronchus or lung: Secondary | ICD-10-CM

## 2020-12-01 LAB — CBC WITH DIFFERENTIAL (CANCER CENTER ONLY)
Abs Immature Granulocytes: 0.02 10*3/uL (ref 0.00–0.07)
Basophils Absolute: 0 10*3/uL (ref 0.0–0.1)
Basophils Relative: 1 %
Eosinophils Absolute: 0.5 10*3/uL (ref 0.0–0.5)
Eosinophils Relative: 9 %
HCT: 32.1 % — ABNORMAL LOW (ref 39.0–52.0)
Hemoglobin: 10.6 g/dL — ABNORMAL LOW (ref 13.0–17.0)
Immature Granulocytes: 0 %
Lymphocytes Relative: 30 %
Lymphs Abs: 1.5 10*3/uL (ref 0.7–4.0)
MCH: 32.9 pg (ref 26.0–34.0)
MCHC: 33 g/dL (ref 30.0–36.0)
MCV: 99.7 fL (ref 80.0–100.0)
Monocytes Absolute: 0.9 10*3/uL (ref 0.1–1.0)
Monocytes Relative: 18 %
Neutro Abs: 2 10*3/uL (ref 1.7–7.7)
Neutrophils Relative %: 42 %
Platelet Count: 226 10*3/uL (ref 150–400)
RBC: 3.22 MIL/uL — ABNORMAL LOW (ref 4.22–5.81)
RDW: 16.5 % — ABNORMAL HIGH (ref 11.5–15.5)
WBC Count: 4.9 10*3/uL (ref 4.0–10.5)
nRBC: 0 % (ref 0.0–0.2)

## 2020-12-01 LAB — CMP (CANCER CENTER ONLY)
ALT: 16 U/L (ref 0–44)
AST: 22 U/L (ref 15–41)
Albumin: 3.9 g/dL (ref 3.5–5.0)
Alkaline Phosphatase: 86 U/L (ref 38–126)
Anion gap: 9 (ref 5–15)
BUN: 11 mg/dL (ref 8–23)
CO2: 25 mmol/L (ref 22–32)
Calcium: 9.7 mg/dL (ref 8.9–10.3)
Chloride: 107 mmol/L (ref 98–111)
Creatinine: 1.03 mg/dL (ref 0.61–1.24)
GFR, Estimated: >60 mL/min (ref >60)
Glucose, Bld: 70 mg/dL (ref 70–99)
Potassium: 4.5 mmol/L (ref 3.5–5.1)
Sodium: 141 mmol/L (ref 135–145)
Total Bilirubin: 0.4 mg/dL (ref 0.3–1.2)
Total Protein: 6.9 g/dL (ref 6.5–8.1)

## 2020-12-01 LAB — TSH: TSH: 3.264 u[IU]/mL (ref 0.320–4.118)

## 2020-12-01 MED ORDER — HEPARIN SOD (PORK) LOCK FLUSH 100 UNIT/ML IV SOLN
500.0000 [IU] | Freq: Once | INTRAVENOUS | Status: AC | PRN
  Administered 2020-12-01: 11:00:00 500 [IU]

## 2020-12-01 MED ORDER — SODIUM CHLORIDE 0.9 % IV SOLN: Freq: Once | INTRAVENOUS | Status: AC

## 2020-12-01 MED ORDER — SODIUM CHLORIDE 0.9% FLUSH
10.0000 mL | INTRAVENOUS | Status: DC | PRN
  Administered 2020-12-01: 11:00:00 10 mL

## 2020-12-01 MED ORDER — PROCHLORPERAZINE MALEATE 10 MG PO TABS
10.0000 mg | ORAL_TABLET | Freq: Once | ORAL | Status: AC
  Administered 2020-12-01: 10:00:00 10 mg via ORAL
  Filled 2020-12-01: qty 1

## 2020-12-01 MED ORDER — CYANOCOBALAMIN 1000 MCG/ML IJ SOLN
1000.0000 ug | Freq: Once | INTRAMUSCULAR | Status: AC
  Administered 2020-12-01: 10:00:00 1000 ug via INTRAMUSCULAR
  Filled 2020-12-01: qty 1

## 2020-12-01 MED ORDER — SODIUM CHLORIDE 0.9 % IV SOLN
500.0000 mg/m2 | Freq: Once | INTRAVENOUS | Status: AC
  Administered 2020-12-01: 11:00:00 1000 mg via INTRAVENOUS
  Filled 2020-12-01: qty 40

## 2020-12-01 MED ORDER — SODIUM CHLORIDE 0.9 % IV SOLN
200.0000 mg | Freq: Once | INTRAVENOUS | Status: AC
  Administered 2020-12-01: 10:00:00 200 mg via INTRAVENOUS
  Filled 2020-12-01: qty 8

## 2020-12-01 NOTE — Patient Instructions (Signed)
[facility] ONCOLOGY   Discharge Instructions: Thank you for choosing [facility] to provide your oncology and hematology care.   If you have a lab appointment with the [facility], please go directly to the [facility] and check in at the registration area.   Wear comfortable clothing and clothing appropriate for easy access to any Portacath or PICC line.   We strive to give you quality time with your provider. You may need to reschedule your appointment if you arrive late (15 or more minutes).  Arriving late affects you and other patients whose appointments are after yours.  Also, if you miss three or more appointments without notifying the office, you may be dismissed from the clinic at the provider's discretion.      For prescription refill requests, have your pharmacy contact our office and allow 72 hours for refills to be completed.    Today you received the following chemotherapy and/or immunotherapy agents: pembrolizumab and pemetrexed.      To help prevent nausea and vomiting after your treatment, we encourage you to take your nausea medication as directed.  BELOW ARE SYMPTOMS THAT SHOULD BE REPORTED IMMEDIATELY: *FEVER GREATER THAN 100.4 F (38 C) OR HIGHER *CHILLS OR SWEATING *NAUSEA AND VOMITING THAT IS NOT CONTROLLED WITH YOUR NAUSEA MEDICATION *UNUSUAL SHORTNESS OF BREATH *UNUSUAL BRUISING OR BLEEDING *URINARY PROBLEMS (pain or burning when urinating, or frequent urination) *BOWEL PROBLEMS (unusual diarrhea, constipation, pain near the anus) TENDERNESS IN MOUTH AND THROAT WITH OR WITHOUT PRESENCE OF ULCERS (sore throat, sores in mouth, or a toothache) UNUSUAL RASH, SWELLING OR PAIN  UNUSUAL VAGINAL DISCHARGE OR ITCHING   Items with * indicate a potential emergency and should be followed up as soon as possible or go to the Emergency Department if any problems should occur.  Please show the CHEMOTHERAPY ALERT CARD or IMMUNOTHERAPY  ALERT CARD at check-in to the Emergency Department and triage nurse.  Should you have questions after your visit or need to cancel or reschedule your appointment, please contact [facility]  Dept: [phone]  and follow the prompts.  Office hours are 8:00 a.m. to 4:30 p.m. Monday - Friday. Please note that voicemails left after 4:00 p.m. may not be returned until the following business day.  We are closed weekends and major holidays. You have access to a nurse at all times for urgent questions. Please call the main number to the clinic Dept: [phone] and follow the prompts.   For any non-urgent questions, you may also contact your provider using MyChart. We now offer e-Visits for anyone 81 and older to request care online for non-urgent symptoms. For details visit [email or web address].   Also download the MyChart app! Go to the app store, search "MyChart", open the app, select [facility], and log in with your MyChart username and password.  Due to Covid, a mask is required upon entering the hospital/clinic. If you do not have a mask, one will be given to you upon arrival. For doctor visits, patients may have 1 support person aged 83 or older with them. For treatment visits, patients cannot have anyone with them due to current Covid guidelines and our immunocompromised population.

## 2020-12-01 NOTE — Progress Notes (Signed)
[place] [phone]   [phone]  OFFICE PROGRESS NOTE  Peter Hong, MD [address] [place] [address]  DIAGNOSIS: Stage IV (T1b, N2, M1a) non-small cell lung cancer, adenocarcinoma diagnosed in July 2022 and presented with left upper lobe nodule in addition to AP window lymphadenopathy and left-sided malignant pleural effusion as well as pleural metastatic disease.  The patient also has solitary left temporal brain metastasis.  Biomarker Findings Microsatellite status - MS-Stable Tumor Mutational Burden - 4 Muts/Mb Genomic Findings For a complete list of the genes assayed, please refer to the Appendix. ZLD3TT S177L PTEN splice site [id number]>S - subclonal? DOT1L S911L - subclonal? RAD21 S271* RB1 loss exons 3-23 TP53 N354f*34 8 Disease relevant genes with no reportable alterations: ALK, BRAF, EGFR, ERBB2, KRAS, MET, RET, ROS1  PRIOR THERAPY: None  CURRENT THERAPY: Systemic chemotherapy with carboplatin for AUC of 5, Alimta 500 Mg/M2 and Keytruda 200 Mg IV every 3 weeks.  First dose August 13, 2020.  Status post 5 cycles.  INTERVAL HISTORY: Peter Franks89y.o. male returns to the clinic today for follow-up visit accompanied by his wife.  The patient is feeling fine today with no concerning complaints except for intermittent pain on the left side of the chest.  He denied having any cough.  He denied having any shortness of breath.  He has no hemoptysis.  The patient denied having any nausea, vomiting, diarrhea or constipation.  He has no headache or visual changes.  He has no weight loss or night sweats.  He is here today for evaluation before starting cycle #6 of his treatment.    MEDICAL HISTORY: Past Medical History:  Diagnosis Date   Lung cancer ([facility]    Lung cancer metastatic to brain ([facility]     ALLERGIES:  has no active allergies.  MEDICATIONS:  Current Outpatient Medications  Medication Sig Dispense Refill    aspirin 81 MG EC tablet Take by mouth.     Bacillus Coagulans-Inulin (PROBIOTIC) 1-250 BILLION-MG CAPS Take 1 tablet by mouth daily.     Benzocaine-Menthol 15-3.6 MG LOZG Take by mouth.     benzonatate (TESSALON) 100 MG capsule Take 100 mg by mouth every 8 (eight) hours.     bisacodyl (DULCOLAX) 5 MG EC tablet Take 5 mg by mouth 3 (three) times daily as needed for moderate constipation.     coal tar-salicylic acid 2 % shampoo Apply topically daily as needed for itching.     DULoxetine (CYMBALTA) 30 MG capsule Take 30 mg by mouth daily.     fluocinonide cream (LIDEX) 09.23% Apply 1 application topically 2 (two) times daily.     folic acid (FOLVITE) 1 MG tablet Take 1 tablet (1 mg total) by mouth daily. 30 tablet 4   Glucosamine-Chondroitin 250-200 MG TABS Take 1 tablet by mouth daily.     hydrocortisone cream 1 % Apply 1 application topically 2 (two) times daily. 453.6 g 0   HYDROMET 5-1.5 MG/5ML syrup Take 5 mLs by mouth every 6 (six) hours as needed.     ketoconazole (NIZORAL) 2 % cream Apply 1 application topically daily.     Krill Oil (OMEGA-3) 500 MG CAPS Take by mouth.     loratadine (CLARITIN) 10 MG tablet Take by mouth.     Magnesium 250 MG TABS Take by mouth.     omeprazole (PRILOSEC) 40 MG capsule Take 40 mg by mouth daily.     ondansetron (ZOFRAN-ODT) 4 MG  disintegrating tablet Take 4 mg by mouth every 8 (eight) hours as needed.     OVER THE COUNTER MEDICATION PNEUMOTROPHIN PMG     prochlorperazine (COMPAZINE) 10 MG tablet TAKE 1 TABLET(10 MG) BY MOUTH EVERY 6 HOURS AS NEEDED FOR NAUSEA OR VOMITING 30 tablet 0   triamcinolone cream (KENALOG) 0.1 % Apply 1 application topically daily as needed. 453 g 6   No current facility-administered medications for this visit.    SURGICAL HISTORY:  Past Surgical History:  Procedure Laterality Date   IR IMAGING GUIDED PORT INSERTION  11/24/2020   KNEE SURGERY     orthoscopic     TONSILLECTOMY AND ADENOIDECTOMY      REVIEW OF SYSTEMS:  A  comprehensive review of systems was negative except for: Constitutional: positive for fatigue Respiratory: positive for pleurisy/chest pain   PHYSICAL EXAMINATION: General appearance: alert, cooperative, fatigued, and no distress Head: Normocephalic, without obvious abnormality, atraumatic Neck: no adenopathy, no JVD, supple, symmetrical, trachea midline, and thyroid not enlarged, symmetric, no tenderness/mass/nodules Lymph nodes: Cervical, supraclavicular, and axillary nodes normal. Resp: clear to auscultation bilaterally Back: symmetric, no curvature. ROM normal. No CVA tenderness. Cardio: regular rate and rhythm, S1, S2 normal, no murmur, click, rub or gallop GI: soft, non-tender; bowel sounds normal; no masses,  no organomegaly Extremities: extremities normal, atraumatic, no cyanosis or edema  ECOG PERFORMANCE STATUS: 1 - Symptomatic but completely ambulatory  Blood pressure 126/73, pulse 74, temperature 97.6 F (36.4 C), temperature source Tympanic, resp. rate 19, height '6\' 1"'  (1.854 m), weight 183 lb 3.2 oz (83.1 kg), SpO2 96 %.  LABORATORY DATA: Lab Results  Component Value Date   WBC 4.9 12/01/2020   HGB 10.6 (L) 12/01/2020   HCT 32.1 (L) 12/01/2020   MCV 99.7 12/01/2020   PLT 226 12/01/2020      Chemistry      Component Value Date/Time   NA 141 11/10/2020 0827   K 4.6 11/10/2020 0827   CL 107 11/10/2020 0827   CO2 25 11/10/2020 0827   BUN 13 11/10/2020 0827   CREATININE 0.91 11/10/2020 0827      Component Value Date/Time   CALCIUM 9.4 11/10/2020 0827   ALKPHOS 99 11/10/2020 0827   AST 18 11/10/2020 0827   ALT 11 11/10/2020 0827   BILITOT 0.3 11/10/2020 0827       RADIOGRAPHIC STUDIES: IR IMAGING GUIDED PORT INSERTION  Result Date: 11/24/2020 INDICATION: 80 year old male with history of left lung cancer requiring central venous access for chemotherapy administration. EXAM: IMPLANTED PORT A CATH PLACEMENT WITH ULTRASOUND AND FLUOROSCOPIC GUIDANCE COMPARISON:   None. MEDICATIONS: None. ANESTHESIA/SEDATION: Moderate (conscious) sedation was employed during this procedure. A total of Versed 2 mg and Fentanyl 50 mcg was administered intravenously. Moderate Sedation Time: 15 minutes. The patient's level of consciousness and vital signs were monitored continuously by radiology nursing throughout the procedure under my direct supervision. CONTRAST:  None FLUOROSCOPY TIME:  0 minutes, 6 seconds (1 mGy) COMPLICATIONS: None immediate. PROCEDURE: The procedure, risks, benefits, and alternatives were explained to the patient. Questions regarding the procedure were encouraged and answered. The patient understands and consents to the procedure. The right neck and chest were prepped with chlorhexidine in a sterile fashion, and a sterile drape was applied covering the operative field. Maximum barrier sterile technique with sterile gowns and gloves were used for the procedure. A timeout was performed prior to the initiation of the procedure. Ultrasound was used to examine the jugular vein which was compressible and free of  internal echoes. A skin marker was used to demarcate the planned venotomy and port pocket incision sites. Local anesthesia was provided to these sites and the subcutaneous tunnel track with 1% lidocaine with 1:100,000 epinephrine. A small incision was created at the jugular access site and blunt dissection was performed of the subcutaneous tissues. Under ultrasound guidance, the jugular vein was accessed with a 21 ga micropuncture needle and an 0.018" wire was inserted to the superior vena cava. Real-time ultrasound guidance was utilized for vascular access including the acquisition of a permanent ultrasound image documenting patency of the accessed vessel. A 5 Fr micopuncture set was then used, through which a 0.035" Rosen wire was passed under fluoroscopic guidance into the inferior vena cava. An 8 Fr dilator was then placed over the wire. A subcutaneous port pocket  was then created along the upper chest wall utilizing a combination of sharp and blunt dissection. The pocket was irrigated with sterile saline, packed with gauze, and observed for hemorrhage. A single lumen "ISP" sized power injectable port was chosen for placement. The 8 Fr catheter was tunneled from the port pocket site to the venotomy incision. The port was placed in the pocket. The external catheter was trimmed to appropriate length. The dilator was exchanged for an 8 Fr peel-away sheath under fluoroscopic guidance. The catheter was then placed through the sheath and the sheath was removed. Final catheter positioning was confirmed and documented with a fluoroscopic spot radiograph. The port was accessed with a Huber needle, aspirated, and flushed with heparinized saline. The deep dermal layer of the port pocket incision was closed with interrupted 3-0 Vicryl suture. Dermabond was then placed over the port pocket and neck incisions. The patient tolerated the procedure well without immediate post procedural complication. FINDINGS: After catheter placement, the tip lies within the superior cavoatrial junction. The catheter aspirates and flushes normally and is ready for immediate use. IMPRESSION: Successful placement of a power injectable Port-A-Cath via the right internal jugular vein. The catheter is ready for immediate use. Ruthann Cancer, MD [facility] Electronically Signed   By: Ruthann Cancer M.D.   On: 11/24/2020 15:56    ASSESSMENT AND PLAN: This is a very pleasant 80 years old white male recently diagnosed with a stage IV (T1b, N2, M1 a) non-small cell lung cancer, adenocarcinoma presented with left upper lobe lung nodule in addition to mediastinal lymphadenopathy and pleural-based metastasis as well as malignant left pleural effusion diagnosed in July 2022. His molecular studies by foundation 1 showed no actionable mutations. The patient is  currently undergoing systemic chemotherapy with carboplatin for AUC of 5, Alimta 500 Mg/M2 and Keytruda 200 Mg IV every 3 weeks.  He is status post 5 cycles.  Starting from cycle #5 the patient will be on treatment with Alimta and Keytruda every 3 weeks. The patient has been tolerating this treatment well with no concerning adverse effects except for mild fatigue. I recommended for him to proceed with cycle #6 today as planned. I will see him back for follow-up visit in 3 weeks for evaluation with repeat CT scan of the chest, abdomen and pelvis for restaging of his disease. The patient was advised to call immediately if he has any other concerning symptoms in the interval. The patient voices understanding of current disease status and treatment options and is in agreement with the current care plan.  All questions were answered. The patient knows to call the clinic with any problems, questions or concerns.  We can certainly see the patient much sooner if necessary.  Disclaimer: This note was dictated with voice recognition software. Similar sounding words can inadvertently be transcribed and may not be corrected upon review.

## 2020-12-09 ENCOUNTER — Ambulatory Visit: Payer: Medicare HMO | Admitting: Internal Medicine

## 2020-12-09 ENCOUNTER — Other Ambulatory Visit: Payer: Medicare HMO

## 2020-12-09 ENCOUNTER — Ambulatory Visit: Payer: Medicare HMO

## 2020-12-11 ENCOUNTER — Ambulatory Visit
Admission: RE | Admit: 2020-12-11 | Discharge: 2020-12-11 | Disposition: A | Discharge status: Home / Self Care | Payer: Medicare HMO | Source: Ambulatory Visit | Attending: Radiation Oncology | Admitting: Radiation Oncology

## 2020-12-11 ENCOUNTER — Other Ambulatory Visit: Payer: Self-pay | Admitting: Radiation Therapy

## 2020-12-11 DIAGNOSIS — C7931 Secondary malignant neoplasm of brain: Secondary | ICD-10-CM

## 2020-12-11 IMAGING — MR MR HEAD WO/W CM
12 series · 48 of 48 positions shown · IV contrast (multihance)
Comparison: MR head [DATE].

CLINICAL DATA: Brain/central nervous sytem neoplasm; monitor 3T
stereotactic radiosurgery, staging, radiation planning; history of
lung cancer primary.

EXAM:
MRI HEAD WITHOUT AND WITH CONTRAST
TECHNIQUE: Multiplanar, multiecho pulse sequences of the brain and surrounding
structures were obtained without and with intravenous contrast.
CONTRAST:  17mL MULTIHANCE GADOBENATE DIMEGLUMINE 529 MG/ML IV SOLN

[Series 2: FLAIR · sagittal · 3.0mm · 0.75mm/px · 1 of 39 slices shown (1 of 2)]
[im 1/39]
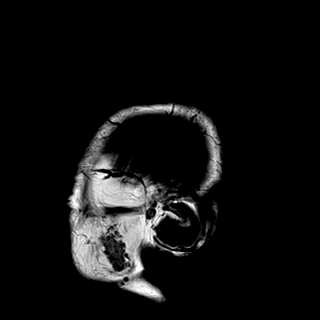

[Series 3: DWI · axial · 3.0mm · 1.50mm/px · z∈[-70,+86]mm · 4 of 82 slices shown (1 of 2)]
[im 1/82]
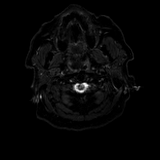
[im 28/82]
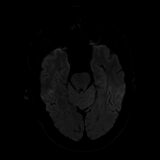
[im 55/82]
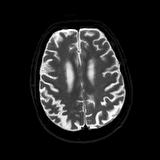
[im 82/82]
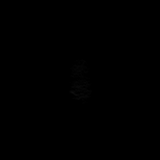

[Series 4: DWI · axial · 3.0mm · 1.50mm/px · z∈[-70,+86]mm · 2 of 41 slices shown (2 of 2)]
[im 1/41]
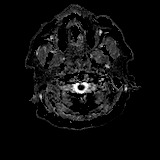
[im 41/41]
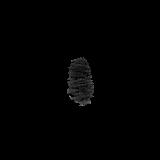

[Series 5: T2 · axial · 5.0mm · 0.57mm/px · 1 of 27 slices shown (1 of 2)]
[im 1/27]
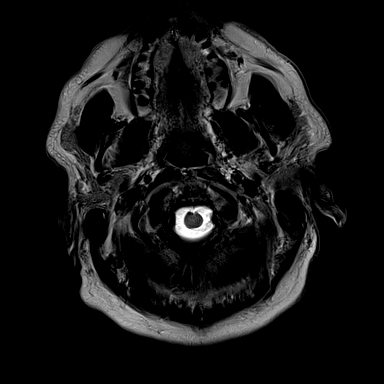

[Series 6: swi_images · axial · 1.5mm · 0.90mm/px · z∈[-73,+82]mm · 5 of 104 slices shown]
[im 1/104]
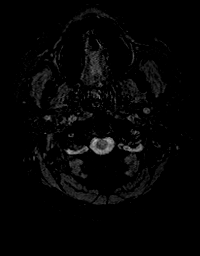
[im 26/104]
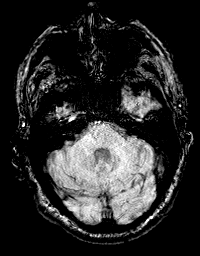
[im 52/104]
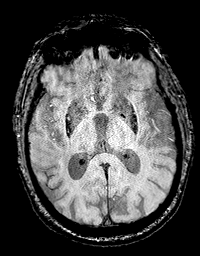
[im 78/104]
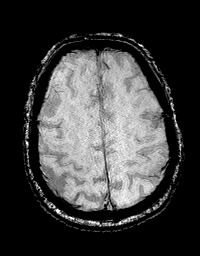
[im 104/104]
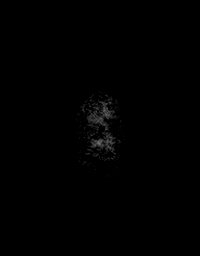

[Series 8: FLAIR · axial · 3.0mm · 0.86mm/px · z∈[-97,+95]mm · 3 of 64 slices shown (2 of 2)]
[im 1/64]
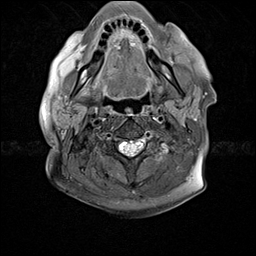
[im 32/64]
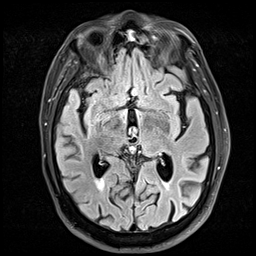
[im 64/64]
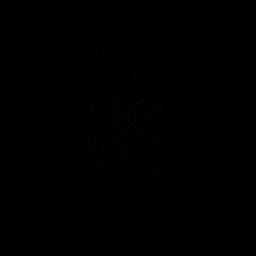

[Series 9: T2 · axial · non-contrast · 1.0mm · 0.86mm/px · z∈[-76,+95]mm · 9 of 176 slices shown (2 of 2)]
[im 1/176]
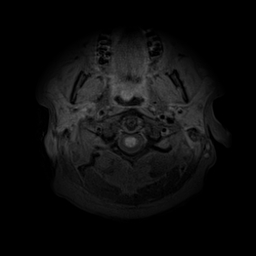
[im 22/176]
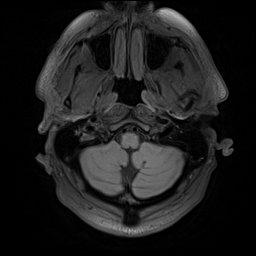
[im 44/176]
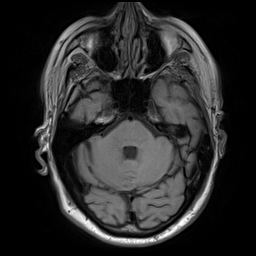
[im 66/176]
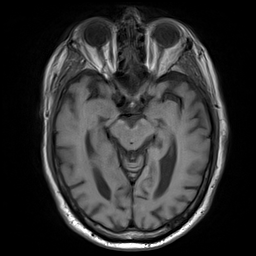
[im 88/176]
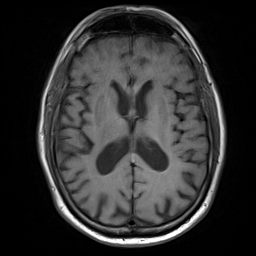
[im 110/176]
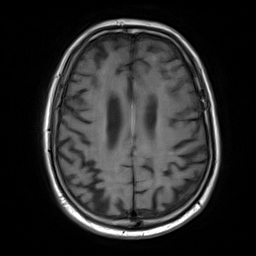
[im 132/176]
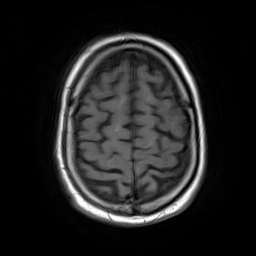
[im 154/176]
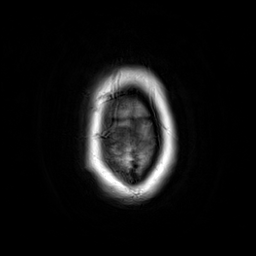
[im 176/176]
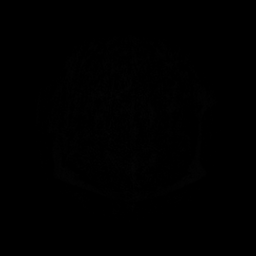

[Series 10: T2 post-contrast · coronal · 3.0mm · 0.57mm/px · 2 of 48 slices shown (1 of 2)]
[im 1/48]
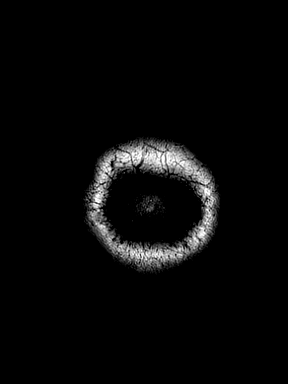
[im 48/48]
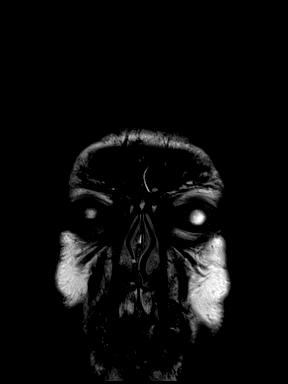

[Series 11: T2 post-contrast · axial · 1.0mm · 0.86mm/px · z∈[-76,+95]mm · 9 of 176 slices shown (2 of 2)]
[im 1/176]
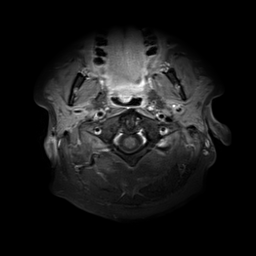
[im 22/176]
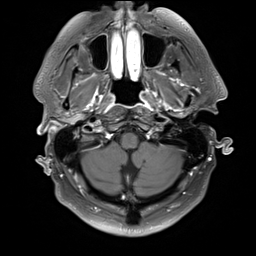
[im 44/176]
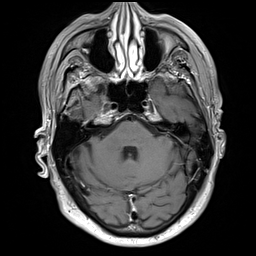
[im 66/176]
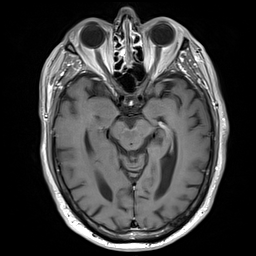
[im 88/176]
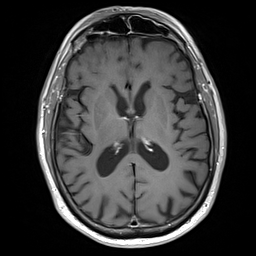
[im 110/176]
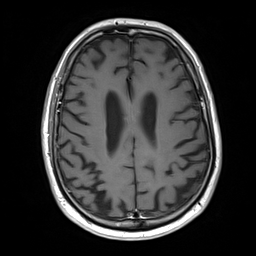
[im 132/176]
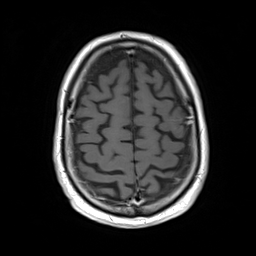
[im 154/176]
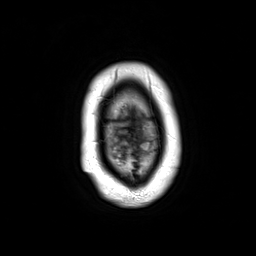
[im 176/176]
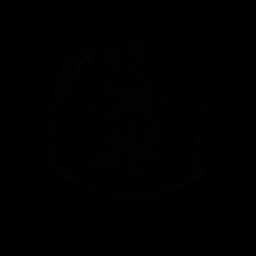

[Series 12: T1 post-contrast · axial · 1.0mm · 0.75mm/px · z∈[-70,+89]mm · 8 of 160 slices shown (1 of 2)]
[im 1/160]
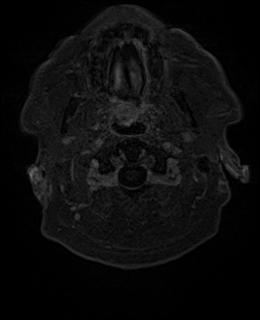
[im 23/160]
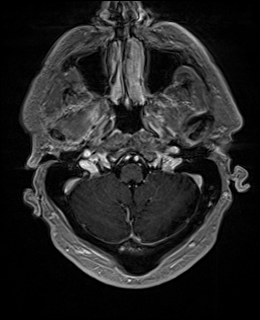
[im 46/160]
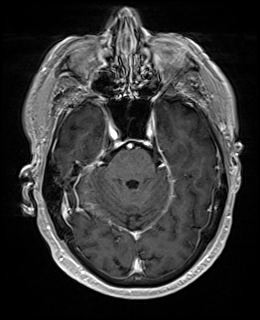
[im 69/160]
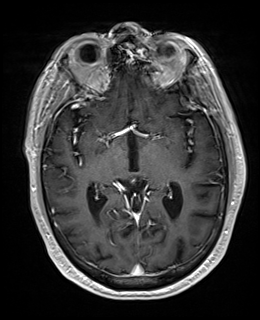
[im 91/160]
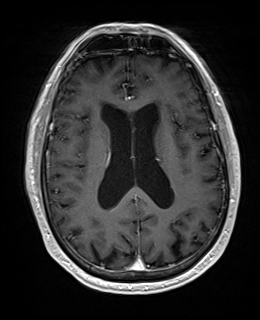
[im 114/160]
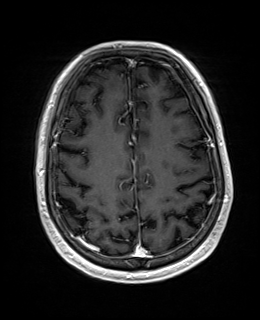
[im 137/160]
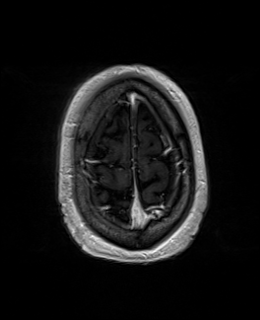
[im 160/160]
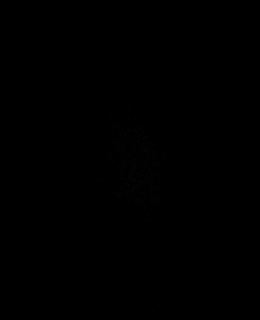

[Series 13: T1 post-contrast · coronal · 3.0mm · 0.57mm/px · 2 of 32 slices shown (2 of 2)]
[im 1/32]
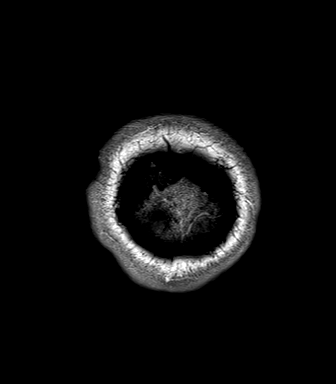
[im 32/32]
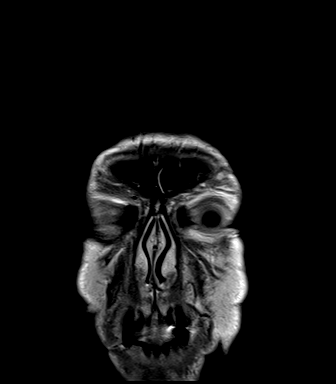

[Series 14: FLAIR post-contrast · sagittal · 3.0mm · 0.75mm/px · 2 of 39 slices shown]
[im 1/39]
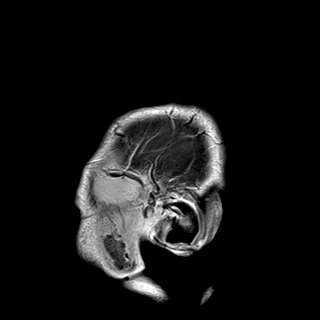
[im 39/39]
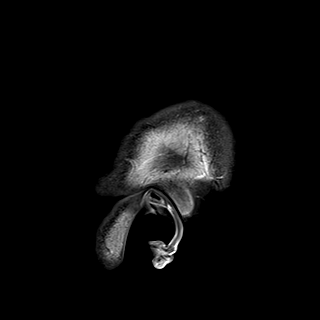

[48 of 48 positions shown; findings below may reference images not displayed]

FINDINGS: Brain: Further contraction of left temporal lobe lesion adjacent to
the temporal horn now measuring 4 mm (previously 6 mm). No longer
appears peripherally enhancing. No edema. No new mass or abnormal
enhancement.

There is no acute infarction or intracranial hemorrhage. Ventricles
are stable in size. No extra-axial collection.

Vascular: Major vessel flow voids at the skull base are preserved.

Skull and upper cervical spine: Normal marrow signal is preserved.

Sinuses/Orbits: Trace mucosal thickening.  Orbits are unremarkable.

Other: Sella is unremarkable. Trace left mastoid fluid
opacification.
IMPRESSION: Further decrease in size of treated left temporal lesion. No new
metastasis.

## 2020-12-11 MED ORDER — SODIUM CHLORIDE 0.9% FLUSH
10.0000 mL | INTRAVENOUS | Status: DC | PRN
  Administered 2020-12-11: 10 mL via INTRAVENOUS

## 2020-12-11 MED ORDER — GADOBENATE DIMEGLUMINE 529 MG/ML IV SOLN
17.0000 mL | Freq: Once | INTRAVENOUS | Status: AC | PRN
  Administered 2020-12-11: 17 mL via INTRAVENOUS

## 2020-12-11 MED ORDER — HEPARIN SOD (PORK) LOCK FLUSH 100 UNIT/ML IV SOLN
500.0000 [IU] | Freq: Once | INTRAVENOUS | Status: AC
  Administered 2020-12-11: 500 [IU] via INTRAVENOUS

## 2020-12-12 ENCOUNTER — Encounter: Payer: Self-pay | Admitting: Radiation Oncology

## 2020-12-12 ENCOUNTER — Telehealth: Payer: Self-pay

## 2020-12-12 DIAGNOSIS — Z95828 Presence of other vascular implants and grafts: Secondary | ICD-10-CM

## 2020-12-12 MED ORDER — LIDOCAINE-PRILOCAINE 2.5-2.5 % EX CREA: 1.0000 "application " | TOPICAL_CREAM | CUTANEOUS | 1 refills | Status: DC | PRN

## 2020-12-12 NOTE — Telephone Encounter (Signed)
Pts wife LM requesting rx for EMLA cream.  I have sent this rx as requested to confirmed pharmacy on file. I have also spoken with pts wife, Bethena Roys, to advise of this. She expressed understanding of this information.

## 2020-12-12 NOTE — Progress Notes (Signed)
Spoke w/ Mrs. Kindred Reidinger (spouse), who's cleared to speak on patient's behalf and identified using 2 identifiers. She reports patient is doing well. No symptoms reported at this time. Meaningful use complete.  Mrs. Demelo notified of Mr. Abel  3:30pm-12/15/20 telephone ONLY  appointment and verbalized understanding.  Patient preferred contact # [phone]

## 2020-12-15 ENCOUNTER — Ambulatory Visit: Payer: Self-pay | Admitting: Radiation Oncology

## 2020-12-15 ENCOUNTER — Ambulatory Visit
Admission: RE | Admit: 2020-12-15 | Discharge: 2020-12-15 | Disposition: A | Discharge status: Home / Self Care | Payer: Medicare HMO | Source: Ambulatory Visit | Attending: Radiation Oncology | Admitting: Radiation Oncology

## 2020-12-15 ENCOUNTER — Inpatient Hospital Stay: Payer: Medicare HMO | Attending: Internal Medicine

## 2020-12-15 DIAGNOSIS — C3412 Malignant neoplasm of upper lobe, left bronchus or lung: Secondary | ICD-10-CM | POA: Insufficient documentation

## 2020-12-15 DIAGNOSIS — Z5112 Encounter for antineoplastic immunotherapy: Secondary | ICD-10-CM | POA: Insufficient documentation

## 2020-12-15 DIAGNOSIS — Z5111 Encounter for antineoplastic chemotherapy: Secondary | ICD-10-CM | POA: Insufficient documentation

## 2020-12-15 DIAGNOSIS — C7931 Secondary malignant neoplasm of brain: Secondary | ICD-10-CM

## 2020-12-15 DIAGNOSIS — C3492 Malignant neoplasm of unspecified part of left bronchus or lung: Secondary | ICD-10-CM

## 2020-12-15 DIAGNOSIS — Z79899 Other long term (current) drug therapy: Secondary | ICD-10-CM | POA: Insufficient documentation

## 2020-12-15 DIAGNOSIS — C782 Secondary malignant neoplasm of pleura: Secondary | ICD-10-CM | POA: Insufficient documentation

## 2020-12-15 DIAGNOSIS — J910 Malignant pleural effusion: Secondary | ICD-10-CM | POA: Insufficient documentation

## 2020-12-15 NOTE — Progress Notes (Signed)
Radiation Oncology         ([phone]) [phone] ________________________________  Outpatient Follow Up - Conducted via telephone due to current COVID-19 concerns for limiting patient exposure  I spoke with the patient to conduct this consult visit via telephone to spare the patient unnecessary potential exposure in the healthcare setting during the current COVID-19 pandemic. The patient was notified in advance and was offered a [place] meeting to allow for face to face communication but unfortunately reported that they did not have the appropriate resources/technology to support such a visit and instead preferred to proceed with a telephone visit.  ________________________________  Name: Peter Brown        MRN: [id number]  Date of Service: 12/15/2020 DOB: [birth year]  WG:NFAOZ, Peter Dibbles, MD  [name], MD     REFERRING PHYSICIAN: [name], MD   DIAGNOSIS: The primary encounter diagnosis was Brain metastases [facility]). A diagnosis of Adenocarcinoma, lung, left ([place]) was also pertinent to this visit.   HISTORY OF PRESENT ILLNESS: Peter Brown is a 80 y.o. male with a diagnosis of stage IV lung Brown.  The patient had been experiencing left chest wall discomfort and discomfort in the back, he was evaluated with chest x-ray which showed a left pleural effusion.  He was sent for evaluation with IR and underwent thoracentesis with successful drainage of 650 mL of pleural fluid on 07/21/2020.  Final cytology showed malignant cells consistent with adenocarcinoma.  He underwent pet imaging on 08/01/2020 which revealed a 14 mm left upper lobe pulmonary nodule that was hypermetabolic with an SUV of up to 10.1.  AP window adenopathy was hypermetabolic as well as some smaller pleural nodules with ranging SUVs between 3 and 4 consistent with an associated left malignant pleural effusion.  No other evidence of distant disease was identified.  Screening MRI for staging on 08/13/2020 showed a 1 cm enhancing lesion in the left  temporal lobe.  He began systemic  chemo immunotherapy on 08/13/2020.  He received single fraction stereotactic radiosurgery on 09/03/20 which he tolerated well.   Since his last visit, he has completed 6 cycles of therapy as of 12/01/20. He had a repeat MRI of the brain for his first baseline comparison on 12/11/20. This showed further decrease in size of his previously treated left temporal lesion, now measuring 4 mm. This had been 10 mm at the time of treatment. He's contacted by phone to review these results.    PREVIOUS RADIATION THERAPY:   09/03/2020 through 09/03/2020 SRS Treatment Site Technique Total Dose (Gy) Dose per Fx (Gy) Completed Fx Beam Energies  Brain: Brain [id number] 3D 20/20 20 1/1 [id number]     PAST MEDICAL HISTORY:  Past Medical History:  Diagnosis Date   Lung Brown ([place])    Lung Brown metastatic to brain [facility])        PAST SURGICAL HISTORY: Past Surgical History:  Procedure Laterality Date   IR IMAGING GUIDED PORT INSERTION  11/24/2020   KNEE SURGERY     orthoscopic     TONSILLECTOMY AND ADENOIDECTOMY       FAMILY HISTORY: History reviewed. No pertinent family history.   SOCIAL HISTORY:  reports that he has never smoked. He has never used smokeless tobacco. He reports that he does not drink alcohol and does not use drugs. The patient is married and lives in [place], [place]. They have 9 grandchildren and enjoy supporting them. One grandson is very active in the theatre.   ALLERGIES: Patient has no active allergies.  MEDICATIONS:  Current Outpatient Medications  Medication Sig Dispense Refill   aspirin 81 MG EC tablet Take by mouth.     Bacillus Coagulans-Inulin (PROBIOTIC) 1-250 BILLION-MG CAPS Take 1 tablet by mouth daily.     Benzocaine-Menthol 15-3.6 MG LOZG Take by mouth. (Patient not taking: Reported on 12/01/2020)     benzonatate (TESSALON) 100 MG capsule Take 100 mg by mouth every 8 (eight) hours. (Patient not taking: Reported on  12/01/2020)     bisacodyl (DULCOLAX) 5 MG EC tablet Take 5 mg by mouth 3 (three) times daily as needed for moderate constipation.     coal tar-salicylic acid 2 % shampoo Apply topically daily as needed for itching.     DULoxetine (CYMBALTA) 30 MG capsule Take 30 mg by mouth daily.     fluocinonide cream (LIDEX) 6.27 % Apply 1 application topically 2 (two) times daily.     folic acid (FOLVITE) 1 MG tablet Take 1 tablet (1 mg total) by mouth daily. 30 tablet 4   Glucosamine-Chondroitin 250-200 MG TABS Take 1 tablet by mouth daily.     hydrocortisone cream 1 % Apply 1 application topically 2 (two) times daily. 453.6 g 0   HYDROMET 5-1.5 MG/5ML syrup Take 5 mLs by mouth every 6 (six) hours as needed. (Patient not taking: Reported on 12/01/2020)     ketoconazole (NIZORAL) 2 % cream Apply 1 application topically daily.     Krill Oil (OMEGA-3) 500 MG CAPS Take by mouth.     lidocaine-prilocaine (EMLA) cream Apply 1 application topically as needed. 30 g 1   loratadine (CLARITIN) 10 MG tablet Take by mouth.     Magnesium 250 MG TABS Take by mouth.     omeprazole (PRILOSEC) 40 MG capsule Take 40 mg by mouth daily. (Patient not taking: Reported on 12/01/2020)     ondansetron (ZOFRAN-ODT) 4 MG disintegrating tablet Take 4 mg by mouth every 8 (eight) hours as needed. (Patient not taking: Reported on 12/01/2020)     OVER THE COUNTER MEDICATION PNEUMOTROPHIN PMG     prochlorperazine (COMPAZINE) 10 MG tablet TAKE 1 TABLET(10 MG) BY MOUTH EVERY 6 HOURS AS NEEDED FOR NAUSEA OR VOMITING (Patient not taking: Reported on 12/01/2020) 30 tablet 0   triamcinolone cream (KENALOG) 0.1 % Apply 1 application topically daily as needed. 453 g 6   No current facility-administered medications for this encounter.     REVIEW OF SYSTEMS: On review of systems, the patient's wife reports that unfortunately the patient had COVID in October.  He is getting over this infection however has had a lot of fatigue and still some  shortness of breath.  Otherwise he seems to be doing well with his systemic therapy.  She states he is not having any complaints of headaches visual changes, auditory changes, movement disturbances or weakness.  No other complaints are verbalized.  PHYSICAL EXAM:  Unable to assess due to encounter type.  ECOG = 1  0 - Asymptomatic (Fully active, able to carry on all predisease activities without restriction)  1 - Symptomatic but completely ambulatory (Restricted in physically strenuous activity but ambulatory and able to carry out work of a light or sedentary nature. For example, light housework, office work)  2 - Symptomatic, <50% in bed during the day (Ambulatory and capable of all self care but unable to carry out any work activities. Up and about more than 50% of waking hours)  3 - Symptomatic, >50% in bed, but not bedbound (Capable of only limited self-care,  confined to bed or chair 50% or more of waking hours)  4 - Bedbound (Completely disabled. Cannot carry on any self-care. Totally confined to bed or chair)  5 - Death   Peter Brown MM, Creech RH, Tormey DC, et al. [phone]). "Toxicity and response criteria of the [facility] Group". [place] Oncol. 5 (6): 649-55    LABORATORY DATA:  Lab Results  Component Value Date   WBC 4.9 12/01/2020   HGB 10.6 (L) 12/01/2020   HCT 32.1 (L) 12/01/2020   MCV 99.7 12/01/2020   PLT 226 12/01/2020   Lab Results  Component Value Date   NA 141 12/01/2020   K 4.5 12/01/2020   CL 107 12/01/2020   CO2 25 12/01/2020   Lab Results  Component Value Date   ALT 16 12/01/2020   AST 22 12/01/2020   ALKPHOS 86 12/01/2020   BILITOT 0.4 12/01/2020      RADIOGRAPHY: MR Brain W Wo Contrast  Result Date: 12/11/2020 CLINICAL DATA:  Brain/central nervous sytem neoplasm; monitor 3T stereotactic radiosurgery, staging, radiation planning; history of lung Brown primary. EXAM: MRI HEAD WITHOUT AND WITH CONTRAST TECHNIQUE: Multiplanar,  multiecho pulse sequences of the brain and surrounding structures were obtained without and with intravenous contrast. CONTRAST:  59mL MULTIHANCE GADOBENATE DIMEGLUMINE 529 MG/ML IV SOLN COMPARISON:  MR head 08/28/2020. FINDINGS: Brain: Further contraction of left temporal lobe lesion adjacent to the temporal horn now measuring 4 mm (previously 6 mm). No longer appears peripherally enhancing. No edema. No new mass or abnormal enhancement. There is no acute infarction or intracranial hemorrhage. Ventricles are stable in size. No extra-axial collection. Vascular: Major vessel flow voids at the skull base are preserved. Skull and upper cervical spine: Normal marrow signal is preserved. Sinuses/Orbits: Trace mucosal thickening.  Orbits are unremarkable. Other: Sella is unremarkable. Trace left mastoid fluid opacification. IMPRESSION: Further decrease in size of treated left temporal lesion. No new metastasis. Electronically Signed   By: Peter Brown M.D.   On: 12/11/2020 14:35   IR IMAGING GUIDED PORT INSERTION  Result Date: 11/24/2020 INDICATION: 80 year old male with history of left lung Brown requiring central venous access for chemotherapy administration. EXAM: IMPLANTED PORT A CATH PLACEMENT WITH ULTRASOUND AND FLUOROSCOPIC GUIDANCE COMPARISON:  None. MEDICATIONS: None. ANESTHESIA/SEDATION: Moderate (conscious) sedation was employed during this procedure. A total of Versed 2 mg and Fentanyl 50 mcg was administered intravenously. Moderate Sedation Time: 15 minutes. The patient's level of consciousness and vital signs were monitored continuously by radiology nursing throughout the procedure under my direct supervision. CONTRAST:  None FLUOROSCOPY TIME:  0 minutes, 6 seconds (1 mGy) COMPLICATIONS: None immediate. PROCEDURE: The procedure, risks, benefits, and alternatives were explained to the patient. Questions regarding the procedure were encouraged and answered. The patient understands and consents to the  procedure. The right neck and chest were prepped with chlorhexidine in a sterile fashion, and a sterile drape was applied covering the operative field. Maximum barrier sterile technique with sterile gowns and gloves were used for the procedure. A timeout was performed prior to the initiation of the procedure. Ultrasound was used to examine the jugular vein which was compressible and free of internal echoes. A skin marker was used to demarcate the planned venotomy and port pocket incision sites. Local anesthesia was provided to these sites and the subcutaneous tunnel track with 1% lidocaine with 1:100,000 epinephrine. A small incision was created at the jugular access site and blunt dissection was performed of the subcutaneous tissues. Under ultrasound guidance, the  jugular vein was accessed with a 21 ga micropuncture needle and an 0.018" wire was inserted to the superior vena cava. Real-time ultrasound guidance was utilized for vascular access including the acquisition of a permanent ultrasound image documenting patency of the accessed vessel. A 5 Fr micopuncture set was then used, through which a 0.035" Rosen wire was passed under fluoroscopic guidance into the inferior vena cava. An 8 Fr dilator was then placed over the wire. A subcutaneous port pocket was then created along the upper chest wall utilizing a combination of sharp and blunt dissection. The pocket was irrigated with sterile saline, packed with gauze, and observed for hemorrhage. A single lumen "ISP" sized power injectable port was chosen for placement. The 8 Fr catheter was tunneled from the port pocket site to the venotomy incision. The port was placed in the pocket. The external catheter was trimmed to appropriate length. The dilator was exchanged for an 8 Fr peel-away sheath under fluoroscopic guidance. The catheter was then placed through the sheath and the sheath was removed. Final catheter positioning was confirmed and documented with a  fluoroscopic spot radiograph. The port was accessed with a Huber needle, aspirated, and flushed with heparinized saline. The deep dermal layer of the port pocket incision was closed with interrupted 3-0 Vicryl suture. Dermabond was then placed over the port pocket and neck incisions. The patient tolerated the procedure well without immediate post procedural complication. FINDINGS: After catheter placement, the tip lies within the superior cavoatrial junction. The catheter aspirates and flushes normally and is ready for immediate use. IMPRESSION: Successful placement of a power injectable Port-A-Cath via the right internal jugular vein. The catheter is ready for immediate use. Peter Cancer, MD [facility] Radiology Electronically Signed   By: Peter Brown M.D.   On: 11/24/2020 15:56       IMPRESSION/PLAN: 1. 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  The patient's MRI was reviewed and we discussed the rationale to continue to closely follow him in the brain oncology program. We recommend he proceed with repeat MRI of the brain in 3 months time. He will continue to follow up with Dr. Julien Nordmann for systemic therapy and will have restaging scans following his next treatment.   Given current concerns for patient exposure during the COVID-19 pandemic, this encounter was conducted via telephone.  The patient has provided two factor identification and has given verbal consent for this type of encounter and has been advised to only accept a meeting of this type in a secure network environment. The time spent during this encounter was 35 minutes including preparation, discussion, and coordination of the patient's care. The attendants for this meeting include  Peter Brown  and Peter Brown's wife, Peter Brown. The patient was not home for the  call but prefers his wife be involved in discussion.  During the encounter,  Peter Brown was located at [facility] Radiation Oncology Department.  Peter Brown was located at home and the patient was out running errands.    Peter Brown, [facility]   **Disclaimer: This note was dictated with voice recognition software. Similar sounding words can inadvertently be transcribed and this note may contain transcription errors which may not have been corrected upon publication of note.**

## 2020-12-16 NOTE — Progress Notes (Signed)
[facility] OFFICE PROGRESS NOTE  [name], MD [address] [place] [address]  DIAGNOSIS: Stage IV (T1b, N2, M1 a) non-small cell lung cancer, adenocarcinoma presented with left upper lobe lung nodule in addition to mediastinal lymphadenopathy and pleural-based metastasis as well as malignant left pleural effusion diagnosed in July 2022.   Molecular Studies: Biomarker Findings Microsatellite status - MS-Stable Tumor Mutational Burden - 4 Muts/Mb Genomic Findings For a complete list of the genes assayed, please refer to the Appendix. 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 MLisbeth Renshaw Completed on 09/03/20.  CURRENT THERAPY: Systemic chemotherapy with carboplatin for AUC of 5, Alimta 500 Mg/M2 and Keytruda 200 Mg IV every 3 weeks.  First dose on 08/13/20.  Status post 6 cycles.  Starting from cycle #5, the patient started maintenance treatment.  INTERVAL HISTORY: Peter [id number]. male returns to the clinic today for a follow-up visit accompanied by his wife. The patient is feeling fairly well today without any concerning complaints. The patient is currently undergoing treatment with maintenance Alimta and Keytruda.  He is tolerating maintenance well except for poor taste for 6 days after treatment. His weight is stable. No evidence of thrush.  In the interval since his last appointment, the patient denies any recent fever, chills, night sweats, or unexplained weight loss.  Denies any hemoptysis. He reports stable to slightly improved dyspnea on exertion. He has intermittent/ "some" cough but "not a lot". If he coughs, he reports it produces yellow phlegm. Denies recent illness or sick contacts except when he  was sick with COVID-19 in October 2022.  He denies any vomiting or diarrhea. He has mild constipation for which he uses Doculax which is helpful for him. He has mild nausea. He does not think compazine is effective for him. He has zofran which he was prescribed when he had COVID-19 but has not used this to see if effective. He has some skin lesion on the dorsal aspect of his hands. They itch mildly. He denies any headache or visual changes.  The patient recently had a restaging CT scan performed.  He is here today for evaluation and repeat blood work before starting cycle #7.  MEDICAL HISTORY: Past Medical History:  Diagnosis Date   [place]    [facility]     ALLERGIES:  has no active allergies.  MEDICATIONS:  Current Outpatient Medications  Medication Sig Dispense Refill   aspirin 81 MG EC tablet Take by mouth.     Bacillus Coagulans-Inulin (PROBIOTIC) 1-250 BILLION-MG CAPS Take 1 tablet by mouth daily.     bisacodyl (DULCOLAX) 5 MG EC tablet Take 5 mg by mouth 3 (three) times daily as needed for moderate constipation.     coal tar-salicylic acid 2 % shampoo Apply topically daily as needed for itching.     DULoxetine (CYMBALTA) 30 MG capsule Take 30 mg by mouth daily.     fluocinonide cream (LIDEX) 02.37% Apply 1 application topically 2 (two) times daily.     folic acid (FOLVITE) 1 MG tablet Take 1 tablet (1 mg total) by mouth daily. 30 tablet 4   Glucosamine-Chondroitin 250-200 MG TABS Take 1 tablet by mouth daily.  hydrocortisone cream 1 % Apply 1 application topically 2 (two) times daily. 453.6 g 0   ketoconazole (NIZORAL) 2 % cream Apply 1 application topically daily.     Krill Oil (OMEGA-3) 500 MG CAPS Take by mouth.     lidocaine-prilocaine (EMLA) cream Apply 1 application topically as needed. 30 g 1   loratadine (CLARITIN) 10 MG tablet Take by mouth.     Magnesium 250 MG TABS Take by mouth.     omeprazole (PRILOSEC) 40 MG capsule Take 40 mg  by mouth daily.     ondansetron (ZOFRAN-ODT) 4 MG disintegrating tablet Take 4 mg by mouth every 8 (eight) hours as needed.     prochlorperazine (COMPAZINE) 10 MG tablet TAKE 1 TABLET(10 MG) BY MOUTH EVERY 6 HOURS AS NEEDED FOR NAUSEA OR VOMITING 30 tablet 0   triamcinolone cream (KENALOG) 0.1 % Apply 1 application topically daily as needed. 453 g 6   No current facility-administered medications for this visit.    SURGICAL HISTORY:  Past Surgical History:  Procedure Laterality Date   IR IMAGING GUIDED PORT INSERTION  11/24/2020   KNEE SURGERY     orthoscopic     TONSILLECTOMY AND ADENOIDECTOMY      REVIEW OF SYSTEMS:   Review of Systems  Constitutional: Positive for fatigue and decreased appetite 6 days following treatment.  Negative for chills, fever and unexpected weight change.  HENT: Negative for mouth sores, nosebleeds, sore throat and trouble swallowing.   Eyes: Negative for eye problems and icterus.  Respiratory: Positive for mild occasional cough and dyspnea on exertion.  Negative for hemoptysis and wheezing.   Cardiovascular: Negative for chest pain and leg swelling.  Gastrointestinal: Positive for mild nausea.  Positive for occasional constipation.  Negative for abdominal pain, diarrhea, and vomiting.  Genitourinary: Negative for bladder incontinence, difficulty urinating, dysuria, frequency and hematuria.   Musculoskeletal: Negative for back pain, gait problem, neck pain and neck stiffness.  Skin: Positive for skin lesions on the dorsal aspect of his hands. Neurological: Negative for dizziness, extremity weakness, gait problem, headaches, light-headedness and seizures.  Hematological: Negative for adenopathy. Does not bruise/bleed easily.  Psychiatric/Behavioral: Negative for confusion, depression and sleep disturbance. The patient is not nervous/anxious.     PHYSICAL EXAMINATION:  Blood pressure 127/67, pulse 74, temperature 97.6 F (36.4 C), temperature source  Tympanic, resp. rate 17, height '6\' 1"'  (1.854 m), weight 186 lb 3.2 oz (84.5 kg), SpO2 97 %.  ECOG PERFORMANCE STATUS: 1  Physical Exam  Constitutional: Oriented to person, place, and time and well-developed, well-nourished, and in no distress. HENT:  Head: Normocephalic and atraumatic.  Mouth/Throat: Oropharynx is clear and moist. No oropharyngeal exudate.  Eyes: Conjunctivae are normal. Right eye exhibits no discharge. Left eye exhibits no discharge. No scleral icterus.  Neck: Normal range of motion. Neck supple.  Cardiovascular: Normal rate, regular rhythm, normal heart sounds and intact distal pulses.   Pulmonary/Chest: Effort normal and breath sounds normal. No respiratory distress. No wheezes. No rales.  Abdominal: Soft. Bowel sounds are normal. Exhibits no distension and no mass. There is no tenderness.  Musculoskeletal: Normal range of motion. Exhibits no edema.  Lymphadenopathy:    No cervical adenopathy.  Neurological: Alert and oriented to person, place, and time. Exhibits normal muscle tone. Gait normal. Coordination normal.  Skin: some skin lesions on dorsal aspect of hand. Skin is warm and dry. No rash noted. Not diaphoretic. No erythema. No pallor.  Psychiatric: Mood, memory and judgment normal.  Vitals reviewed.  LABORATORY DATA: Lab Results  Component Value Date   WBC 6.0 12/22/2020   HGB 11.7 (L) 12/22/2020   HCT 35.3 (L) 12/22/2020   MCV 100.3 (H) 12/22/2020   PLT 224 12/22/2020      Chemistry      Component Value Date/Time   NA 141 12/22/2020 1004   K 4.4 12/22/2020 1004   CL 106 12/22/2020 1004   CO2 27 12/22/2020 1004   BUN 12 12/22/2020 1004   CREATININE 1.04 12/22/2020 1004      Component Value Date/Time   CALCIUM 9.5 12/22/2020 1004   ALKPHOS 91 12/22/2020 1004   AST 24 12/22/2020 1004   ALT 14 12/22/2020 1004   BILITOT 0.3 12/22/2020 1004       RADIOGRAPHIC STUDIES:  CT Chest W Contrast  Result Date: 12/19/2020 CLINICAL DATA:  A  79 year old male presents for follow-up of non-small cell lung cancer, currently undergoing therapy. EXAM: CT CHEST, ABDOMEN, AND PELVIS WITH CONTRAST TECHNIQUE: Multidetector CT imaging of the chest, abdomen and pelvis was performed following the standard protocol during bolus administration of intravenous contrast. CONTRAST:  71m OMNIPAQUE IOHEXOL 350 MG/ML SOLN COMPARISON:  October 22, 2020. FINDINGS: CT CHEST FINDINGS Cardiovascular: Calcified atheromatous plaque in the thoracic aorta and noncalcified plaque with similar appearance to prior imaging. Heart size stable. No pericardial effusion. Central pulmonary vasculature is normal caliber. RIGHT-sided Port-A-Cath terminates at the caval to atrial junction. Mediastinum/Nodes: No thoracic inlet, axillary, mediastinal or hilar adenopathy. Esophagus grossly normal. Scattered small lymph nodes display no change largest along the RIGHT subcarinal region approximately 8 mm short axis. Lungs/Pleura: Interstitial thickening and basilar bronchiectasis without without signs of honeycombing showing a similar pattern to previous imaging. Peri fissural nodules in the LEFT chest along the major fissure unchanged. More focal areas of ground-glass in the LEFT upper lobe show improvement since prior imaging, more generalized subpleural banding and ground-glass attenuation is present in the LEFT upper lobe and in the RIGHT upper lobe Bandlike area in the LEFT lower lobe with nodular terminal aspect at the periphery measuring approximately 6 mm greatest thickness (image 71/4) this is new with respect to the peripheral more nodular area and increased with respect to bandlike changes that extend from the hilum along the fissure. LEFT upper lobe nodule (image 61/4) 7 mm as compared to 8-9 mm on the previous study. No effusion. No consolidation. Musculoskeletal: See below for full musculoskeletal details. CT ABDOMEN PELVIS FINDINGS Hepatobiliary: No focal, suspicious hepatic lesion.  No pericholecystic stranding. No biliary duct dilation. Portal vein is patent. Pancreas: Normal, without mass, inflammation or ductal dilatation. Spleen: Spleen normal size and contour. Adrenals/Urinary Tract: Adrenal glands are unremarkable. Symmetric renal enhancement. No sign of hydronephrosis. No suspicious renal lesion or perinephric stranding. Urinary bladder is grossly unremarkable. Mild cortical scarring. Cyst arises from the anterior interpolar LEFT kidney. Stomach/Bowel: No acute gastrointestinal process. Normal appendix. Colonic diverticulosis. Vascular/Lymphatic: Aortic atherosclerosis. No sign of aneurysm. Smooth contour of the IVC. There is no gastrohepatic or hepatoduodenal ligament lymphadenopathy. No retroperitoneal or mesenteric lymphadenopathy. No pelvic sidewall lymphadenopathy. Atherosclerotic changes are mild to moderate. Reproductive: Unremarkable by CT with mild prostatomegaly. Other: No ascites.  No pneumoperitoneum.  No peritoneal nodularity. Musculoskeletal: No acute bone finding. No destructive bone process. Spinal degenerative changes. IMPRESSION: Pleural and parenchymal scarring developing amidst interstitial changes with mid and lower lung predominance not associated with bronchiectasis and with improving areas of focal ground-glass. Findings could be related to post infectious or inflammatory fibrosis with improvement with respect  to patchy areas of ground-glass in the upper lobes. Reportedly the patient does have a history of COVID-19 infection, attention on follow-up. LEFT upper lobe nodule 7 mm as compared to 8-9 mm on the previous study. No new or progressive findings. Aortic atherosclerosis. Aortic Atherosclerosis (ICD10-I70.0). Electronically Signed   By: Zetta Bills M.D.   On: 12/19/2020 17:08   MR Brain W Wo Contrast  Result Date: 12/11/2020 CLINICAL DATA:  Brain/central nervous sytem neoplasm; monitor 3T stereotactic radiosurgery, staging, radiation planning; history  of lung cancer primary. EXAM: MRI HEAD WITHOUT AND WITH CONTRAST TECHNIQUE: Multiplanar, multiecho pulse sequences of the brain and surrounding structures were obtained without and with intravenous contrast. CONTRAST:  58m MULTIHANCE GADOBENATE DIMEGLUMINE 529 MG/ML IV SOLN COMPARISON:  MR head 08/28/2020. FINDINGS: Brain: Further contraction of left temporal lobe lesion adjacent to the temporal horn now measuring 4 mm (previously 6 mm). No longer appears peripherally enhancing. No edema. No new mass or abnormal enhancement. There is no acute infarction or intracranial hemorrhage. Ventricles are stable in size. No extra-axial collection. Vascular: Major vessel flow voids at the skull base are preserved. Skull and upper cervical spine: Normal marrow signal is preserved. Sinuses/Orbits: Trace mucosal thickening.  Orbits are unremarkable. Other: Sella is unremarkable. Trace left mastoid fluid opacification. IMPRESSION: Further decrease in size of treated left temporal lesion. No new metastasis. Electronically Signed   By: PMacy MisM.D.   On: 12/11/2020 14:35   CT Abdomen Pelvis W Contrast  Result Date: 12/19/2020 CLINICAL DATA:  A 80year old male presents for follow-up of non-small cell lung cancer, currently undergoing therapy. EXAM: CT CHEST, ABDOMEN, AND PELVIS WITH CONTRAST TECHNIQUE: Multidetector CT imaging of the chest, abdomen and pelvis was performed following the standard protocol during bolus administration of intravenous contrast. CONTRAST:  76mOMNIPAQUE IOHEXOL 350 MG/ML SOLN COMPARISON:  October 22, 2020. FINDINGS: CT CHEST FINDINGS Cardiovascular: Calcified atheromatous plaque in the thoracic aorta and noncalcified plaque with similar appearance to prior imaging. Heart size stable. No pericardial effusion. Central pulmonary vasculature is normal caliber. RIGHT-sided Port-A-Cath terminates at the caval to atrial junction. Mediastinum/Nodes: No thoracic inlet, axillary, mediastinal or hilar  adenopathy. Esophagus grossly normal. Scattered small lymph nodes display no change largest along the RIGHT subcarinal region approximately 8 mm short axis. Lungs/Pleura: Interstitial thickening and basilar bronchiectasis without without signs of honeycombing showing a similar pattern to previous imaging. Peri fissural nodules in the LEFT chest along the major fissure unchanged. More focal areas of ground-glass in the LEFT upper lobe show improvement since prior imaging, more generalized subpleural banding and ground-glass attenuation is present in the LEFT upper lobe and in the RIGHT upper lobe Bandlike area in the LEFT lower lobe with nodular terminal aspect at the periphery measuring approximately 6 mm greatest thickness (image 71/4) this is new with respect to the peripheral more nodular area and increased with respect to bandlike changes that extend from the hilum along the fissure. LEFT upper lobe nodule (image 61/4) 7 mm as compared to 8-9 mm on the previous study. No effusion. No consolidation. Musculoskeletal: See below for full musculoskeletal details. CT ABDOMEN PELVIS FINDINGS Hepatobiliary: No focal, suspicious hepatic lesion. No pericholecystic stranding. No biliary duct dilation. Portal vein is patent. Pancreas: Normal, without mass, inflammation or ductal dilatation. Spleen: Spleen normal size and contour. Adrenals/Urinary Tract: Adrenal glands are unremarkable. Symmetric renal enhancement. No sign of hydronephrosis. No suspicious renal lesion or perinephric stranding. Urinary bladder is grossly unremarkable. Mild cortical scarring. Cyst arises from the anterior  interpolar LEFT kidney. Stomach/Bowel: No acute gastrointestinal process. Normal appendix. Colonic diverticulosis. Vascular/Lymphatic: Aortic atherosclerosis. No sign of aneurysm. Smooth contour of the IVC. There is no gastrohepatic or hepatoduodenal ligament lymphadenopathy. No retroperitoneal or mesenteric lymphadenopathy. No pelvic  sidewall lymphadenopathy. Atherosclerotic changes are mild to moderate. Reproductive: Unremarkable by CT with mild prostatomegaly. Other: No ascites.  No pneumoperitoneum.  No peritoneal nodularity. Musculoskeletal: No acute bone finding. No destructive bone process. Spinal degenerative changes. IMPRESSION: Pleural and parenchymal scarring developing amidst interstitial changes with mid and lower lung predominance not associated with bronchiectasis and with improving areas of focal ground-glass. Findings could be related to post infectious or inflammatory fibrosis with improvement with respect to patchy areas of ground-glass in the upper lobes. Reportedly the patient does have a history of COVID-19 infection, attention on follow-up. LEFT upper lobe nodule 7 mm as compared to 8-9 mm on the previous study. No new or progressive findings. Aortic atherosclerosis. Aortic Atherosclerosis (ICD10-I70.0). Electronically Signed   By: Zetta Bills M.D.   On: 12/19/2020 17:08   IR IMAGING GUIDED PORT INSERTION  Result Date: 11/24/2020 INDICATION: 80 year old male with history of left lung cancer requiring central venous access for chemotherapy administration. EXAM: IMPLANTED PORT A CATH PLACEMENT WITH ULTRASOUND AND FLUOROSCOPIC GUIDANCE COMPARISON:  None. MEDICATIONS: None. ANESTHESIA/SEDATION: Moderate (conscious) sedation was employed during this procedure. A total of Versed 2 mg and Fentanyl 50 mcg was administered intravenously. Moderate Sedation Time: 15 minutes. The patient's level of consciousness and vital signs were monitored continuously by radiology nursing throughout the procedure under my direct supervision. CONTRAST:  None FLUOROSCOPY TIME:  0 minutes, 6 seconds (1 mGy) COMPLICATIONS: None immediate. PROCEDURE: The procedure, risks, benefits, and alternatives were explained to the patient. Questions regarding the procedure were encouraged and answered. The patient understands and consents to the procedure.  The right neck and chest were prepped with chlorhexidine in a sterile fashion, and a sterile drape was applied covering the operative field. Maximum barrier sterile technique with sterile gowns and gloves were used for the procedure. A timeout was performed prior to the initiation of the procedure. Ultrasound was used to examine the jugular vein which was compressible and free of internal echoes. A skin marker was used to demarcate the planned venotomy and port pocket incision sites. Local anesthesia was provided to these sites and the subcutaneous tunnel track with 1% lidocaine with 1:100,000 epinephrine. A small incision was created at the jugular access site and blunt dissection was performed of the subcutaneous tissues. Under ultrasound guidance, the jugular vein was accessed with a 21 ga micropuncture needle and an 0.018" wire was inserted to the superior vena cava. Real-time ultrasound guidance was utilized for vascular access including the acquisition of a permanent ultrasound image documenting patency of the accessed vessel. A 5 Fr micopuncture set was then used, through which a 0.035" Rosen wire was passed under fluoroscopic guidance into the inferior vena cava. An 8 Fr dilator was then placed over the wire. A subcutaneous port pocket was then created along the upper chest wall utilizing a combination of sharp and blunt dissection. The pocket was irrigated with sterile saline, packed with gauze, and observed for hemorrhage. A single lumen "ISP" sized power injectable port was chosen for placement. The 8 Fr catheter was tunneled from the port pocket site to the venotomy incision. The port was placed in the pocket. The external catheter was trimmed to appropriate length. The dilator was exchanged for an 8 Fr peel-away sheath under fluoroscopic guidance. The  catheter was then placed through the sheath and the sheath was removed. Final catheter positioning was confirmed and documented with a fluoroscopic spot  radiograph. The port was accessed with a Huber needle, aspirated, and flushed with heparinized saline. The deep dermal layer of the port pocket incision was closed with interrupted 3-0 Vicryl suture. Dermabond was then placed over the port pocket and neck incisions. The patient tolerated the procedure well without immediate post procedural complication. FINDINGS: After catheter placement, the tip lies within the superior cavoatrial junction. The catheter aspirates and flushes normally and is ready for immediate use. IMPRESSION: Successful placement of a power injectable Port-A-Cath via the right internal jugular vein. The catheter is ready for immediate use. Ruthann Cancer, MD [facility] Radiology Electronically Signed   By: Ruthann Cancer M.D.   On: 11/24/2020 15:56     ASSESSMENT/PLAN:  This is a very pleasant 80 year old Caucasian male diagnosed with stage IV (T1b, N2, M1A) non-small cell lung cancer, adenocarcinoma.  The patient presented with a left upper lobe lung nodule in addition to mediastinal lymphadenopathy and pleural-based metastasis as well as a malignant left pleural effusion.  The patient was diagnosed in July 2022.  The patient also had a metastatic brain lesion.   The patient completed SRS to the brain lesion on 09/03/2020 under the care of Dr. [name].  The patient is currently undergoing systemic chemotherapy with carboplatin for an AUC of 5, Alimta 500 mg per metered squared, Keytruda 200 mg IV every 3 weeks.  The patient is status post 6 cycles.  Starting from cycle #5, the patient started maintenance Alimta and Keytruda  The patient recently had a restaging CT scan performed.  Labs were reviewed.  Dr. Julien Nordmann person independently reviewed the scan and discussed the results with the patient today.  The scan showed no evidence of disease progression.  The patient scan did continue to note some pleural and parenchymal scarring with improving  areas of focal groundglass.  Dr. Julien Nordmann believes he is findings may be secondary to his history of COVID-19 infection but we will continue to monitor this on future studies.  The patient is not having any signs and symptoms of infection at this time.  He reports his baseline shortness of breath which is stable to mildly improved compared to prior.  He also denies any significant cough.  Dr. Julien Nordmann recommends the patient proceed with cycle #7 today schedule.  We will see him back for follow-up visit in 3 weeks for evaluation before starting cycle #8.  Advised he may use Zofran as an alternative to Compazine to see if that helps control his nausea better.  He has some leftover from what was prescribed in October from COVID-19.  If he needs refill, advised him to call and I will be happy to send in a refill.  The patient was advised to call immediately if he has any concerning symptoms in the interval. The patient voices understanding of current disease status and treatment options and is in agreement with the current care plan. All questions were answered. The patient knows to call the clinic with any problems, questions or concerns. We can certainly see the patient much sooner if necessary      No orders of the defined types were placed in this encounter.    Dacotah Cabello L Tami Barren, PA-C 12/22/20  ADDENDUM: Hematology/Oncology Attending: I had a face-to-face encounter with the patient today.  I reviewed his record, lab, scan and recommended his care  plan.  This is a very pleasant 80 years old white male diagnosed with a stage IV (T1b, N2, M1 a) non-small cell lung cancer, adenocarcinoma presented with left upper lobe lung nodule in addition to mediastinal lymphadenopathy and pleural-based metastasis as well as malignant left pleural effusion diagnosed in July 2022 with metastatic brain lesion.  The patient has no actionable mutations.  The patient started systemic chemotherapy with  carboplatin, Alimta and Keytruda for 4 cycles and he is currently undergoing maintenance treatment with Alimta and Keytruda every 3 weeks status post 2 more cycles.  He has been tolerating his treatment well with no concerning adverse effects. He had repeat CT scan of the chest, abdomen pelvis performed recently.  I personally and independently reviewed the scans and discussed the results with the patient and his wife. His scan showed no concerning findings for disease progression but he has some groundglass opacities consistent with inflammatory process that need close monitoring on upcoming imaging studies. I recommended for the patient to continue his maintenance treatment with Alimta and Keytruda and he will proceed with cycle #7 today. The patient will come back for follow-up visit in 3 weeks for evaluation before the next cycle of his treatment. He was advised to call immediately if he has any other concerning symptoms in the interval. The total time spent in the appointment was 30 minutes. Disclaimer: This note was dictated with voice recognition software. Similar sounding words can inadvertently be transcribed and may be missed upon review. Eilleen Kempf, MD 12/22/20

## 2020-12-18 ENCOUNTER — Encounter (HOSPITAL_COMMUNITY): Payer: Self-pay

## 2020-12-18 ENCOUNTER — Ambulatory Visit (HOSPITAL_COMMUNITY)
Admission: RE | Admit: 2020-12-18 | Discharge: 2020-12-18 | Disposition: A | Discharge status: Home / Self Care | Payer: Medicare HMO | Source: Ambulatory Visit | Attending: Internal Medicine | Admitting: Internal Medicine

## 2020-12-18 DIAGNOSIS — C3490 Malignant neoplasm of unspecified part of unspecified bronchus or lung: Secondary | ICD-10-CM | POA: Diagnosis not present

## 2020-12-18 IMAGING — CT CT ABD-PELV W/ CM
2 of 5 series · 12 of 36 positions shown, 15 images · IV contrast (OMNIPAQUE)
Comparison: [DATE].

CLINICAL DATA: A 80-year-old male presents for follow-up of
non-small cell lung cancer, currently undergoing therapy.

EXAM:
CT CHEST, ABDOMEN, AND PELVIS WITH CONTRAST
TECHNIQUE: Multidetector CT imaging of the chest, abdomen and pelvis was
performed following the standard protocol during bolus
administration of intravenous contrast.
CONTRAST:  75mL OMNIPAQUE IOHEXOL 350 MG/ML SOLN

[Series 2: cap with · axial · 0.85mm/px · z∈[-397,+158]mm · 9 of 137 slices shown, 12 images]
[im 13/137  mediastinal]
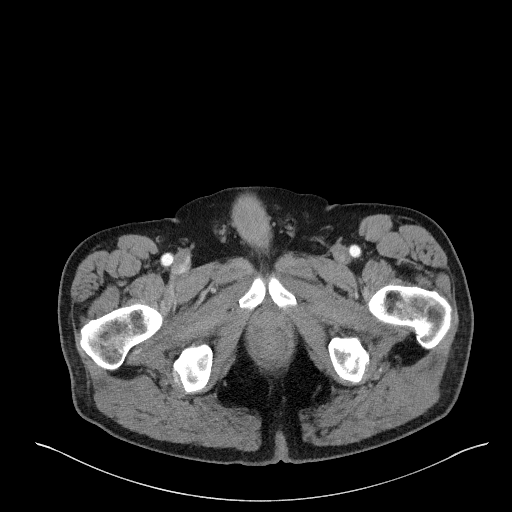
[im 13/137  lung]
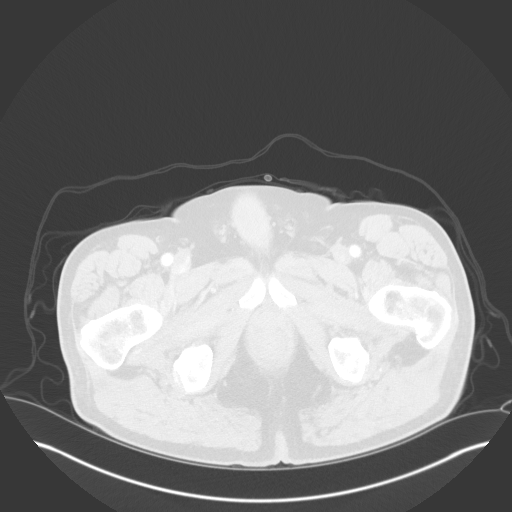
[im 25/137  lung]
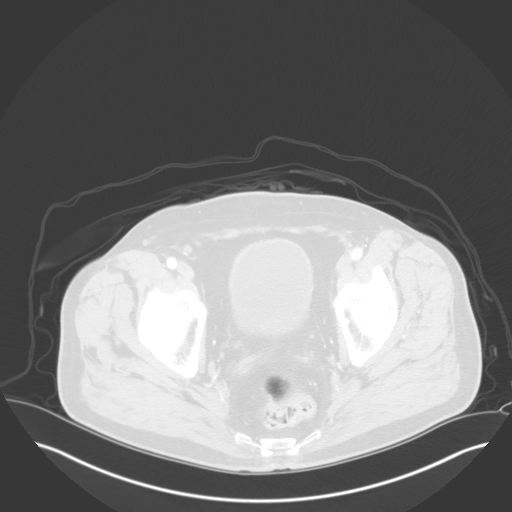
[im 38/137  lung]
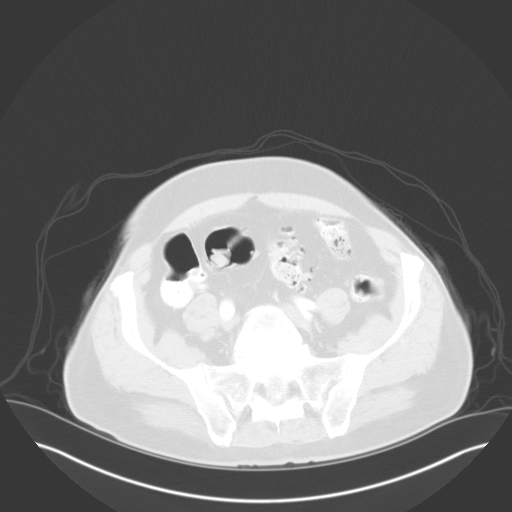
[im 50/137  lung]
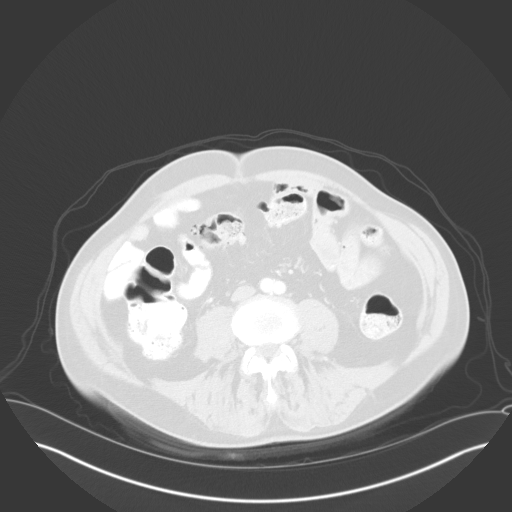
[im 75/137  mediastinal]
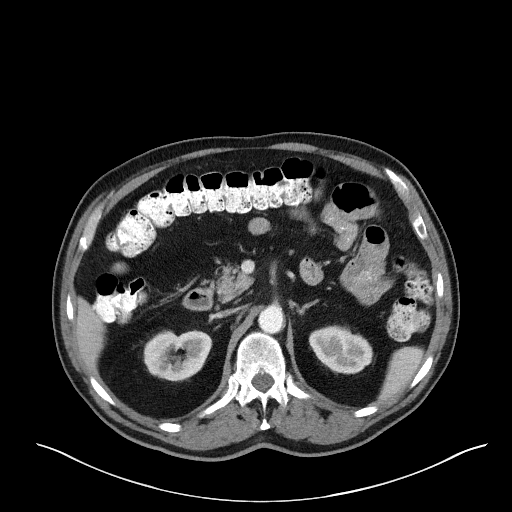
[im 75/137  lung]
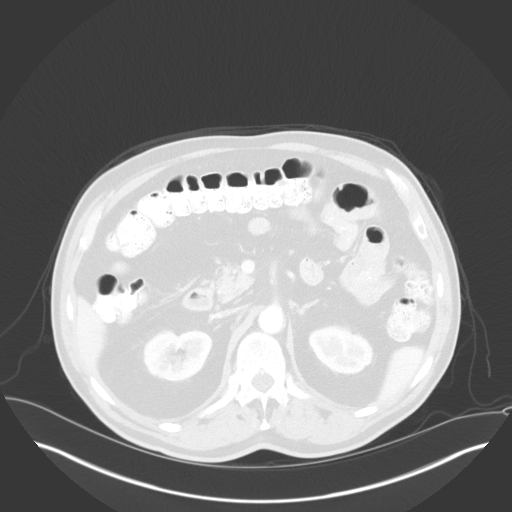
[im 87/137  lung]
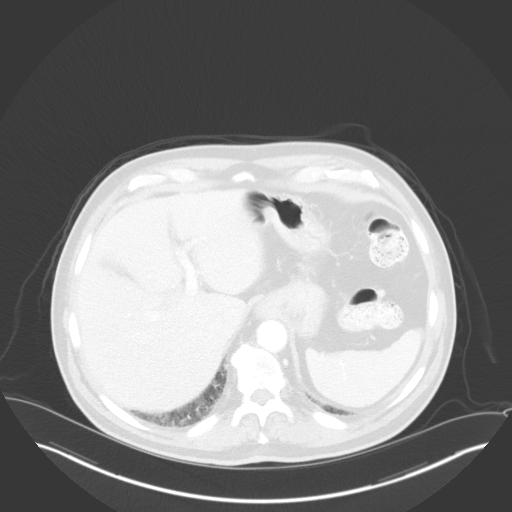
[im 99/137  lung]
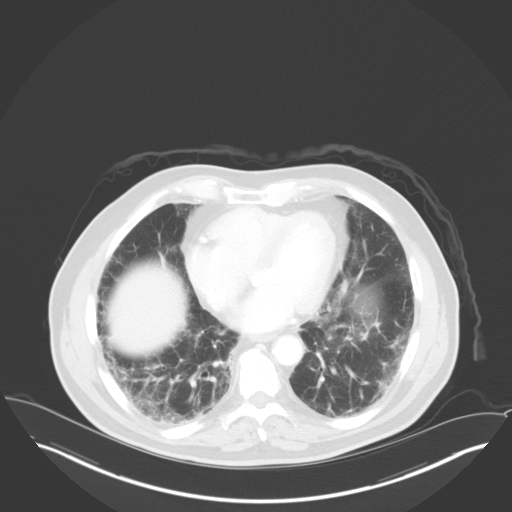
[im 112/137  lung]
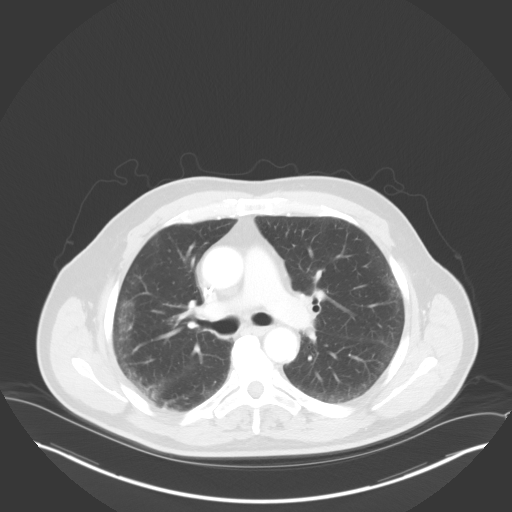
[im 124/137  mediastinal]
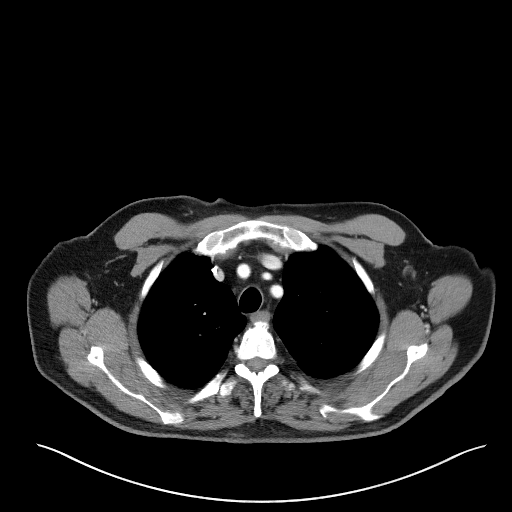
[im 124/137  lung]
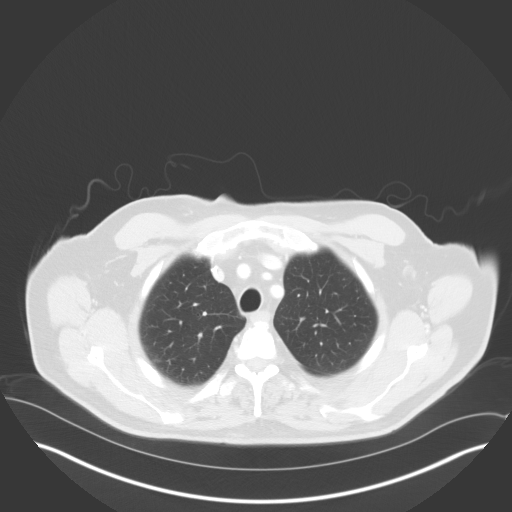

[Series 5: coronals · coronal · 0.83mm/px · 3 of 130 slices shown]
[im 26/130  lung]
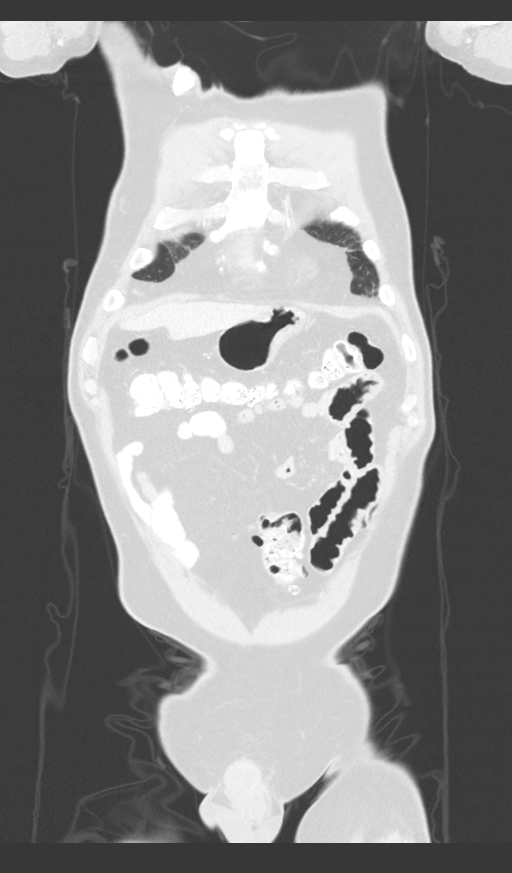
[im 52/130  lung]
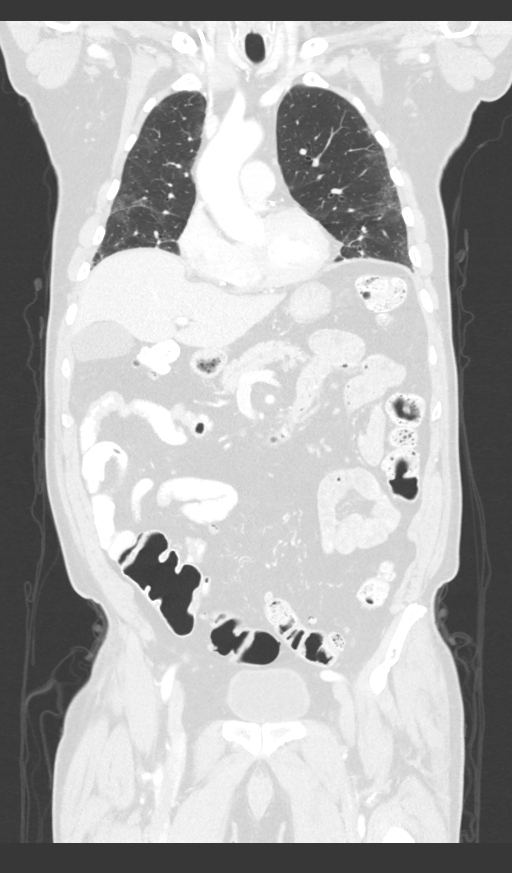
[im 78/130  lung]
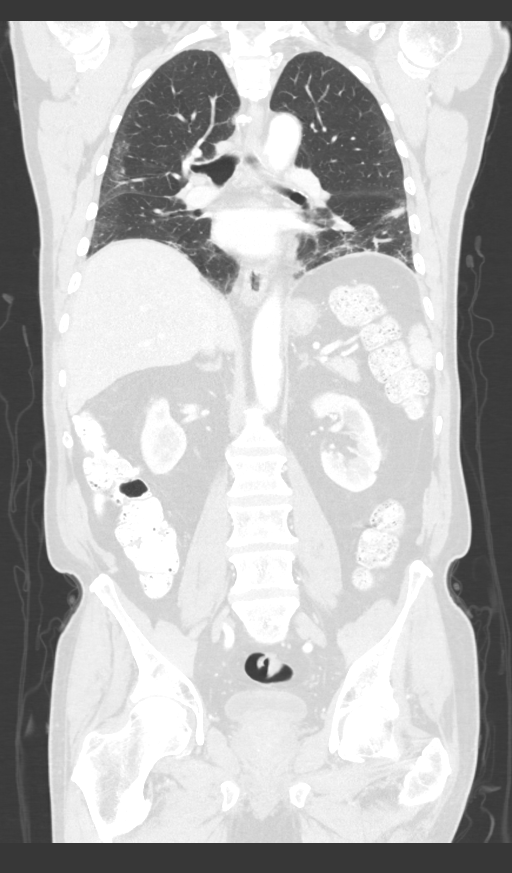

[12 of 36 positions shown; findings below may reference images not displayed]

FINDINGS: CT CHEST FINDINGS

Cardiovascular: Calcified atheromatous plaque in the thoracic aorta
and noncalcified plaque with similar appearance to prior imaging.
Heart size stable. No pericardial effusion.

Central pulmonary vasculature is normal caliber. RIGHT-sided
Port-A-Cath terminates at the caval to atrial junction.

Mediastinum/Nodes: No thoracic inlet, axillary, mediastinal or hilar
adenopathy. Esophagus grossly normal. Scattered small lymph nodes
display no change largest along the RIGHT subcarinal region
approximately 8 mm short axis.

Lungs/Pleura: Interstitial thickening and basilar bronchiectasis
without without signs of honeycombing showing a similar pattern to
previous imaging. Peri fissural nodules in the LEFT chest along the
major fissure unchanged. More focal areas of ground-glass in the
LEFT upper lobe show improvement since prior imaging, more
generalized subpleural banding and ground-glass attenuation is
present in the LEFT upper lobe and in the RIGHT upper lobe

Bandlike area in the LEFT lower lobe with nodular terminal aspect at
the periphery measuring approximately 6 mm greatest thickness (image
71/4) this is new with respect to the peripheral more nodular area
and increased with respect to bandlike changes that extend from the
hilum along the fissure.

LEFT upper lobe nodule (image 61/4) 7 mm as compared to 8-9 mm on
the previous study. No effusion. No consolidation.

Musculoskeletal: See below for full musculoskeletal details.

CT ABDOMEN PELVIS FINDINGS

Hepatobiliary: No focal, suspicious hepatic lesion. No
pericholecystic stranding. No biliary duct dilation. Portal vein is
patent.

Pancreas: Normal, without mass, inflammation or ductal dilatation.

Spleen: Spleen normal size and contour.

Adrenals/Urinary Tract:

Adrenal glands are unremarkable. Symmetric renal enhancement. No
sign of hydronephrosis. No suspicious renal lesion or perinephric
stranding.

Urinary bladder is grossly unremarkable.

Mild cortical scarring. Cyst arises from the anterior interpolar
LEFT kidney.

Stomach/Bowel: No acute gastrointestinal process. Normal appendix.
Colonic diverticulosis.

Vascular/Lymphatic:

Aortic atherosclerosis. No sign of aneurysm. Smooth contour of the
IVC. There is no gastrohepatic or hepatoduodenal ligament
lymphadenopathy. No retroperitoneal or mesenteric lymphadenopathy.

No pelvic sidewall lymphadenopathy.

Atherosclerotic changes are mild to moderate.

Reproductive: Unremarkable by CT with mild prostatomegaly.

Other: No ascites.  No pneumoperitoneum.  No peritoneal nodularity.

Musculoskeletal: No acute bone finding. No destructive bone process.
Spinal degenerative changes.
IMPRESSION: Pleural and parenchymal scarring developing amidst interstitial
changes with mid and lower lung predominance not associated with
bronchiectasis and with improving areas of focal ground-glass.
Findings could be related to post infectious or inflammatory
fibrosis with improvement with respect to patchy areas of
ground-glass in the upper lobes. Reportedly the patient does have a
history of [A3] infection, attention on follow-up.

LEFT upper lobe nodule 7 mm as compared to 8-9 mm on the previous
study.

No new or progressive findings.

Aortic atherosclerosis.

Aortic Atherosclerosis ([A3]-[A3]).

## 2020-12-18 MED ORDER — HEPARIN SOD (PORK) LOCK FLUSH 100 UNIT/ML IV SOLN
INTRAVENOUS | Status: AC
  Filled 2020-12-18: qty 5

## 2020-12-18 MED ORDER — SODIUM CHLORIDE (PF) 0.9 % IJ SOLN
INTRAMUSCULAR | Status: AC
  Filled 2020-12-18: qty 50

## 2020-12-18 MED ORDER — HEPARIN SOD (PORK) LOCK FLUSH 100 UNIT/ML IV SOLN
500.0000 [IU] | Freq: Once | INTRAVENOUS | Status: AC
  Administered 2020-12-18: 500 [IU] via INTRAVENOUS

## 2020-12-18 MED ORDER — IOHEXOL 350 MG/ML SOLN
75.0000 mL | Freq: Once | INTRAVENOUS | Status: AC | PRN
  Administered 2020-12-18: 75 mL via INTRAVENOUS

## 2020-12-20 ENCOUNTER — Other Ambulatory Visit: Payer: Self-pay

## 2020-12-22 ENCOUNTER — Inpatient Hospital Stay: Payer: Medicare HMO

## 2020-12-22 ENCOUNTER — Inpatient Hospital Stay (HOSPITAL_BASED_OUTPATIENT_CLINIC_OR_DEPARTMENT_OTHER): Payer: Medicare HMO | Admitting: Physician Assistant

## 2020-12-22 ENCOUNTER — Encounter: Payer: Self-pay | Admitting: Physician Assistant

## 2020-12-22 ENCOUNTER — Other Ambulatory Visit: Payer: Self-pay

## 2020-12-22 VITALS — BP 127/67 | HR 74 | Temp 97.6°F | Resp 17 | Ht 73.0 in | Wt 186.2 lb

## 2020-12-22 DIAGNOSIS — Z5112 Encounter for antineoplastic immunotherapy: Secondary | ICD-10-CM

## 2020-12-22 DIAGNOSIS — C3492 Malignant neoplasm of unspecified part of left bronchus or lung: Secondary | ICD-10-CM | POA: Diagnosis not present

## 2020-12-22 DIAGNOSIS — C7931 Secondary malignant neoplasm of brain: Secondary | ICD-10-CM

## 2020-12-22 DIAGNOSIS — Z79899 Other long term (current) drug therapy: Secondary | ICD-10-CM | POA: Diagnosis not present

## 2020-12-22 DIAGNOSIS — Z5111 Encounter for antineoplastic chemotherapy: Secondary | ICD-10-CM | POA: Diagnosis present

## 2020-12-22 DIAGNOSIS — J910 Malignant pleural effusion: Secondary | ICD-10-CM | POA: Diagnosis not present

## 2020-12-22 DIAGNOSIS — C782 Secondary malignant neoplasm of pleura: Secondary | ICD-10-CM | POA: Diagnosis not present

## 2020-12-22 DIAGNOSIS — C3412 Malignant neoplasm of upper lobe, left bronchus or lung: Secondary | ICD-10-CM | POA: Diagnosis not present

## 2020-12-22 LAB — CMP (CANCER CENTER ONLY)
ALT: 14 U/L (ref 0–44)
AST: 24 U/L (ref 15–41)
Albumin: 3.9 g/dL (ref 3.5–5.0)
Alkaline Phosphatase: 91 U/L (ref 38–126)
Anion gap: 8 (ref 5–15)
BUN: 12 mg/dL (ref 8–23)
CO2: 27 mmol/L (ref 22–32)
Calcium: 9.5 mg/dL (ref 8.9–10.3)
Chloride: 106 mmol/L (ref 98–111)
Creatinine: 1.04 mg/dL (ref 0.61–1.24)
GFR, Estimated: >60 mL/min (ref >60)
Glucose, Bld: 69 mg/dL — ABNORMAL LOW (ref 70–99)
Potassium: 4.4 mmol/L (ref 3.5–5.1)
Sodium: 141 mmol/L (ref 135–145)
Total Bilirubin: 0.3 mg/dL (ref 0.3–1.2)
Total Protein: 7 g/dL (ref 6.5–8.1)

## 2020-12-22 LAB — CBC WITH DIFFERENTIAL (CANCER CENTER ONLY)
Abs Immature Granulocytes: 0.02 10*3/uL (ref 0.00–0.07)
Basophils Absolute: 0.1 10*3/uL (ref 0.0–0.1)
Basophils Relative: 1 %
Eosinophils Absolute: 0.6 10*3/uL — ABNORMAL HIGH (ref 0.0–0.5)
Eosinophils Relative: 10 %
HCT: 35.3 % — ABNORMAL LOW (ref 39.0–52.0)
Hemoglobin: 11.7 g/dL — ABNORMAL LOW (ref 13.0–17.0)
Immature Granulocytes: 0 %
Lymphocytes Relative: 24 %
Lymphs Abs: 1.5 10*3/uL (ref 0.7–4.0)
MCH: 33.2 pg (ref 26.0–34.0)
MCHC: 33.1 g/dL (ref 30.0–36.0)
MCV: 100.3 fL — ABNORMAL HIGH (ref 80.0–100.0)
Monocytes Absolute: 1.1 10*3/uL — ABNORMAL HIGH (ref 0.1–1.0)
Monocytes Relative: 18 %
Neutro Abs: 2.8 10*3/uL (ref 1.7–7.7)
Neutrophils Relative %: 47 %
Platelet Count: 224 10*3/uL (ref 150–400)
RBC: 3.52 MIL/uL — ABNORMAL LOW (ref 4.22–5.81)
RDW: 14.6 % (ref 11.5–15.5)
WBC Count: 6 10*3/uL (ref 4.0–10.5)
nRBC: 0 % (ref 0.0–0.2)

## 2020-12-22 LAB — TSH: TSH: 4.747 u[IU]/mL — ABNORMAL HIGH (ref 0.320–4.118)

## 2020-12-22 MED ORDER — SODIUM CHLORIDE 0.9% FLUSH
10.0000 mL | INTRAVENOUS | Status: DC | PRN
  Administered 2020-12-22: 14:00:00 10 mL

## 2020-12-22 MED ORDER — HEPARIN SOD (PORK) LOCK FLUSH 100 UNIT/ML IV SOLN
500.0000 [IU] | Freq: Once | INTRAVENOUS | Status: AC | PRN
  Administered 2020-12-22: 14:00:00 500 [IU]

## 2020-12-22 MED ORDER — SODIUM CHLORIDE 0.9 % IV SOLN
500.0000 mg/m2 | Freq: Once | INTRAVENOUS | Status: AC
  Administered 2020-12-22: 13:00:00 1000 mg via INTRAVENOUS
  Filled 2020-12-22: qty 40

## 2020-12-22 MED ORDER — SODIUM CHLORIDE 0.9 % IV SOLN: Freq: Once | INTRAVENOUS | Status: AC

## 2020-12-22 MED ORDER — PROCHLORPERAZINE MALEATE 10 MG PO TABS
10.0000 mg | ORAL_TABLET | Freq: Once | ORAL | Status: AC
  Administered 2020-12-22: 12:00:00 10 mg via ORAL
  Filled 2020-12-22: qty 1

## 2020-12-22 MED ORDER — SODIUM CHLORIDE 0.9 % IV SOLN
200.0000 mg | Freq: Once | INTRAVENOUS | Status: AC
  Administered 2020-12-22: 13:00:00 200 mg via INTRAVENOUS
  Filled 2020-12-22: qty 8

## 2020-12-22 NOTE — Patient Instructions (Signed)
[facility]  Discharge Instructions: Thank you for choosing [facility] to provide your oncology and hematology care.   If you have a lab appointment with the [facility], please go directly to the [facility] and check in at the registration area.   Wear comfortable clothing and clothing appropriate for easy access to any Portacath or PICC line.   We strive to give you quality time with your provider. You may need to reschedule your appointment if you arrive late (15 or more minutes).  Arriving late affects you and other patients whose appointments are after yours.  Also, if you miss three or more appointments without notifying the office, you may be dismissed from the clinic at the providers discretion.      For prescription refill requests, have your pharmacy contact our office and allow 72 hours for refills to be completed.    Today you received the following chemotherapy and/or immunotherapy agents: keytruda and alimta      To help prevent nausea and vomiting after your treatment, we encourage you to take your nausea medication as directed.  BELOW ARE SYMPTOMS THAT SHOULD BE REPORTED IMMEDIATELY: *FEVER GREATER THAN 100.4 F (38 C) OR HIGHER *CHILLS OR SWEATING *NAUSEA AND VOMITING THAT IS NOT CONTROLLED WITH YOUR NAUSEA MEDICATION *UNUSUAL SHORTNESS OF BREATH *UNUSUAL BRUISING OR BLEEDING *URINARY PROBLEMS (pain or burning when urinating, or frequent urination) *BOWEL PROBLEMS (unusual diarrhea, constipation, pain near the anus) TENDERNESS IN MOUTH AND THROAT WITH OR WITHOUT PRESENCE OF ULCERS (sore throat, sores in mouth, or a toothache) UNUSUAL RASH, SWELLING OR PAIN  UNUSUAL VAGINAL DISCHARGE OR ITCHING   Items with * indicate a potential emergency and should be followed up as soon as possible or go to the Emergency Department if any problems should occur.  Please show the CHEMOTHERAPY ALERT CARD or IMMUNOTHERAPY ALERT CARD at  check-in to the Emergency Department and triage nurse.  Should you have questions after your visit or need to cancel or reschedule your appointment, please contact [facility]  Dept: [phone]  and follow the prompts.  Office hours are 8:00 a.m. to 4:30 p.m. Monday - Friday. Please note that voicemails left after 4:00 p.m. may not be returned until the following business day.  We are closed weekends and major holidays. You have access to a nurse at all times for urgent questions. Please call the main number to the clinic Dept: [phone] and follow the prompts.   For any non-urgent questions, you may also contact your provider using MyChart. We now offer e-Visits for anyone 85 and older to request care online for non-urgent symptoms. For details visit [email or web address].   Also download the MyChart app! Go to the app store, search "MyChart", open the app, select [facility], and log in with your MyChart username and password.  Due to Covid, a mask is required upon entering the hospital/clinic. If you do not have a mask, one will be given to you upon arrival. For doctor visits, patients may have 1 support person aged 43 or older with them. For treatment visits, patients cannot have anyone with them due to current Covid guidelines and our immunocompromised population.

## 2020-12-29 ENCOUNTER — Other Ambulatory Visit: Payer: Self-pay | Admitting: Internal Medicine

## 2020-12-30 ENCOUNTER — Ambulatory Visit: Payer: Medicare HMO

## 2020-12-30 ENCOUNTER — Other Ambulatory Visit: Payer: Medicare HMO

## 2020-12-30 ENCOUNTER — Ambulatory Visit: Payer: Medicare HMO | Admitting: Internal Medicine

## 2021-01-12 ENCOUNTER — Other Ambulatory Visit: Payer: Self-pay

## 2021-01-12 ENCOUNTER — Inpatient Hospital Stay: Payer: Medicare HMO

## 2021-01-12 ENCOUNTER — Other Ambulatory Visit: Payer: Self-pay | Admitting: Internal Medicine

## 2021-01-12 ENCOUNTER — Encounter: Payer: Self-pay | Admitting: Internal Medicine

## 2021-01-12 ENCOUNTER — Other Ambulatory Visit: Payer: Medicare HMO

## 2021-01-12 ENCOUNTER — Inpatient Hospital Stay: Payer: Medicare HMO | Attending: Internal Medicine | Admitting: Internal Medicine

## 2021-01-12 VITALS — BP 115/73 | HR 88 | Temp 97.9°F | Resp 17 | Ht 73.0 in | Wt 183.1 lb

## 2021-01-12 DIAGNOSIS — Z79899 Other long term (current) drug therapy: Secondary | ICD-10-CM | POA: Insufficient documentation

## 2021-01-12 DIAGNOSIS — C3412 Malignant neoplasm of upper lobe, left bronchus or lung: Secondary | ICD-10-CM | POA: Insufficient documentation

## 2021-01-12 DIAGNOSIS — Z5112 Encounter for antineoplastic immunotherapy: Secondary | ICD-10-CM | POA: Insufficient documentation

## 2021-01-12 DIAGNOSIS — C3492 Malignant neoplasm of unspecified part of left bronchus or lung: Secondary | ICD-10-CM | POA: Diagnosis not present

## 2021-01-12 DIAGNOSIS — C782 Secondary malignant neoplasm of pleura: Secondary | ICD-10-CM | POA: Insufficient documentation

## 2021-01-12 DIAGNOSIS — C7931 Secondary malignant neoplasm of brain: Secondary | ICD-10-CM | POA: Diagnosis not present

## 2021-01-12 DIAGNOSIS — J910 Malignant pleural effusion: Secondary | ICD-10-CM | POA: Diagnosis not present

## 2021-01-12 DIAGNOSIS — Z5111 Encounter for antineoplastic chemotherapy: Secondary | ICD-10-CM | POA: Insufficient documentation

## 2021-01-12 DIAGNOSIS — R110 Nausea: Secondary | ICD-10-CM | POA: Insufficient documentation

## 2021-01-12 DIAGNOSIS — Z95828 Presence of other vascular implants and grafts: Secondary | ICD-10-CM

## 2021-01-12 LAB — TSH: TSH: 3.491 u[IU]/mL (ref 0.320–4.118)

## 2021-01-12 LAB — CMP (CANCER CENTER ONLY)
ALT: 11 U/L (ref 0–44)
AST: 22 U/L (ref 15–41)
Albumin: 3.9 g/dL (ref 3.5–5.0)
Alkaline Phosphatase: 74 U/L (ref 38–126)
Anion gap: 5 (ref 5–15)
BUN: 13 mg/dL (ref 8–23)
CO2: 29 mmol/L (ref 22–32)
Calcium: 9.6 mg/dL (ref 8.9–10.3)
Chloride: 105 mmol/L (ref 98–111)
Creatinine: 0.99 mg/dL (ref 0.61–1.24)
GFR, Estimated: >60 mL/min (ref >60)
Glucose, Bld: 81 mg/dL (ref 70–99)
Potassium: 4.8 mmol/L (ref 3.5–5.1)
Sodium: 139 mmol/L (ref 135–145)
Total Bilirubin: 0.3 mg/dL (ref 0.3–1.2)
Total Protein: 6.8 g/dL (ref 6.5–8.1)

## 2021-01-12 LAB — CBC WITH DIFFERENTIAL (CANCER CENTER ONLY)
Abs Immature Granulocytes: 0.02 10*3/uL (ref 0.00–0.07)
Basophils Absolute: 0.1 10*3/uL (ref 0.0–0.1)
Basophils Relative: 1 %
Eosinophils Absolute: 0.5 10*3/uL (ref 0.0–0.5)
Eosinophils Relative: 8 %
HCT: 34.6 % — ABNORMAL LOW (ref 39.0–52.0)
Hemoglobin: 11.7 g/dL — ABNORMAL LOW (ref 13.0–17.0)
Immature Granulocytes: 0 %
Lymphocytes Relative: 25 %
Lymphs Abs: 1.6 10*3/uL (ref 0.7–4.0)
MCH: 33.6 pg (ref 26.0–34.0)
MCHC: 33.8 g/dL (ref 30.0–36.0)
MCV: 99.4 fL (ref 80.0–100.0)
Monocytes Absolute: 1.1 10*3/uL — ABNORMAL HIGH (ref 0.1–1.0)
Monocytes Relative: 18 %
Neutro Abs: 3 10*3/uL (ref 1.7–7.7)
Neutrophils Relative %: 48 %
Platelet Count: 201 10*3/uL (ref 150–400)
RBC: 3.48 MIL/uL — ABNORMAL LOW (ref 4.22–5.81)
RDW: 13.8 % (ref 11.5–15.5)
WBC Count: 6.2 10*3/uL (ref 4.0–10.5)
nRBC: 0 % (ref 0.0–0.2)

## 2021-01-12 MED ORDER — SODIUM CHLORIDE 0.9 % IV SOLN
200.0000 mg | Freq: Once | INTRAVENOUS | Status: AC
  Administered 2021-01-12: 200 mg via INTRAVENOUS
  Filled 2021-01-12: qty 8

## 2021-01-12 MED ORDER — HEPARIN SOD (PORK) LOCK FLUSH 100 UNIT/ML IV SOLN
500.0000 [IU] | Freq: Once | INTRAVENOUS | Status: AC | PRN
  Administered 2021-01-12: 500 [IU]

## 2021-01-12 MED ORDER — PROCHLORPERAZINE MALEATE 10 MG PO TABS
10.0000 mg | ORAL_TABLET | Freq: Once | ORAL | Status: AC
  Administered 2021-01-12: 10 mg via ORAL
  Filled 2021-01-12: qty 1

## 2021-01-12 MED ORDER — SODIUM CHLORIDE 0.9 % IV SOLN
500.0000 mg/m2 | Freq: Once | INTRAVENOUS | Status: AC
  Administered 2021-01-12: 1000 mg via INTRAVENOUS
  Filled 2021-01-12: qty 40

## 2021-01-12 MED ORDER — SODIUM CHLORIDE 0.9 % IV SOLN: Freq: Once | INTRAVENOUS | Status: AC

## 2021-01-12 MED ORDER — SODIUM CHLORIDE 0.9% FLUSH
10.0000 mL | INTRAVENOUS | Status: DC | PRN
  Administered 2021-01-12: 10 mL via INTRAVENOUS

## 2021-01-12 MED ORDER — SODIUM CHLORIDE 0.9% FLUSH
10.0000 mL | INTRAVENOUS | Status: DC | PRN
  Administered 2021-01-12: 10 mL

## 2021-01-12 NOTE — Progress Notes (Signed)
[facility] Telephone:([phone]) [phone]   Fax:(336) [phone]  OFFICE PROGRESS NOTE  [name], MD [address] [place] [address]  DIAGNOSIS: Stage IV (T1b, N2, M1a) non-small cell lung cancer, adenocarcinoma diagnosed in July 2022 and presented with left upper lobe nodule in addition to AP window lymphadenopathy and left-sided malignant pleural effusion as well as pleural metastatic disease.  The patient also has solitary left temporal brain metastasis.  Biomarker Findings Microsatellite status - MS-Stable Tumor Mutational Burden - 4 Muts/Mb Genomic Findings For a complete list of the genes assayed, please refer to the Appendix. [id number] PTEN splice site [id number]>F - subclonal DOT1L S911L - subclonal RAD21 S271* RB1 loss exons 3-23 TP53 N346f*34 8 Disease relevant genes with no reportable alterations: ALK, BRAF, EGFR, ERBB2, KRAS, MET, RET, ROS1  PRIOR THERAPY: None  CURRENT THERAPY: Systemic chemotherapy with carboplatin for AUC of 5, Alimta 500 Mg/M2 and Keytruda 200 Mg IV every 3 weeks.  First dose August 13, 2020.  Status post 7 cycles.  INTERVAL HISTORY: Peter Deery89y.o. male returns to the clinic today for follow-up visit.  The patient is feeling fine today with no concerning complaints except for mild fatigue and few episodes of nausea few days after his treatment.  The rest of the cycle he is doing fine.  He denied having any current chest pain, shortness of breath except with exertion with no cough or hemoptysis.  He denied having any fever or chills.  He has no nausea, vomiting, diarrhea or constipation.  He has no headache or visual changes.  He denied having any significant weight loss or night sweats.  He is here today for evaluation before starting cycle #8 of his treatment.   MEDICAL HISTORY: Past Medical History:  Diagnosis Date   [place]    Lung cancer metastatic to brain ([facility]     ALLERGIES:  has no active  allergies.  MEDICATIONS:  Current Outpatient Medications  Medication Sig Dispense Refill   aspirin 81 MG EC tablet Take by mouth.     Bacillus Coagulans-Inulin (PROBIOTIC) 1-250 BILLION-MG CAPS Take 1 tablet by mouth daily.     bisacodyl (DULCOLAX) 5 MG EC tablet Take 5 mg by mouth 3 (three) times daily as needed for moderate constipation.     coal tar-salicylic acid 2 % shampoo Apply topically daily as needed for itching.     DULoxetine (CYMBALTA) 30 MG capsule Take 30 mg by mouth daily.     fluocinonide cream (LIDEX) 02.92% Apply 1 application topically 2 (two) times daily.     folic acid (FOLVITE) 1 MG tablet TAKE 1 TABLET(1 MG) BY MOUTH DAILY 30 tablet 4   Glucosamine-Chondroitin 250-200 MG TABS Take 1 tablet by mouth daily.     hydrocortisone cream 1 % Apply 1 application topically 2 (two) times daily. 453.6 g 0   ketoconazole (NIZORAL) 2 % cream Apply 1 application topically daily.     Krill Oil (OMEGA-3) 500 MG CAPS Take by mouth.     lidocaine-prilocaine (EMLA) cream Apply 1 application topically as needed. 30 g 1   loratadine (CLARITIN) 10 MG tablet Take by mouth.     Magnesium 250 MG TABS Take by mouth.     omeprazole (PRILOSEC) 40 MG capsule Take 40 mg by mouth daily.     ondansetron (ZOFRAN-ODT) 4 MG disintegrating tablet Take 4 mg by mouth every 8 (eight) hours as needed.     prochlorperazine (COMPAZINE) 10  MG tablet TAKE 1 TABLET(10 MG) BY MOUTH EVERY 6 HOURS AS NEEDED FOR NAUSEA OR VOMITING 30 tablet 0   triamcinolone cream (KENALOG) 0.1 % Apply 1 application topically daily as needed. 453 g 6   No current facility-administered medications for this visit.   Facility-Administered Medications Ordered in Other Visits  Medication Dose Route Frequency Provider Last Rate Last Admin   sodium chloride flush (NS) 0.9 % injection 10 mL  10 mL Intravenous PRN Curt Bears, MD   10 mL at 01/12/21 0957    SURGICAL HISTORY:  Past Surgical History:  Procedure Laterality Date    IR IMAGING GUIDED PORT INSERTION  11/24/2020   KNEE SURGERY     orthoscopic     TONSILLECTOMY AND ADENOIDECTOMY      REVIEW OF SYSTEMS:  A comprehensive review of systems was negative except for: Constitutional: positive for fatigue Respiratory: positive for dyspnea on exertion Gastrointestinal: positive for nausea   PHYSICAL EXAMINATION: General appearance: alert, cooperative, fatigued, and no distress Head: Normocephalic, without obvious abnormality, atraumatic Neck: no adenopathy, no JVD, supple, symmetrical, trachea midline, and thyroid not enlarged, symmetric, no tenderness/mass/nodules Lymph nodes: Cervical, supraclavicular, and axillary nodes normal. Resp: clear to auscultation bilaterally Back: symmetric, no curvature. ROM normal. No CVA tenderness. Cardio: regular rate and rhythm, S1, S2 normal, no murmur, click, rub or gallop GI: soft, non-tender; bowel sounds normal; no masses,  no organomegaly Extremities: extremities normal, atraumatic, no cyanosis or edema  ECOG PERFORMANCE STATUS: 1 - Symptomatic but completely ambulatory  Blood pressure 115/73, pulse 88, temperature 97.9 F (36.6 C), temperature source Temporal, resp. rate 17, height '6\' 1"'  (1.854 m), weight 183 lb 1.6 oz (83.1 kg), SpO2 98 %.  LABORATORY DATA: Lab Results  Component Value Date   WBC 6.0 12/22/2020   HGB 11.7 (L) 12/22/2020   HCT 35.3 (L) 12/22/2020   MCV 100.3 (H) 12/22/2020   PLT 224 12/22/2020      Chemistry      Component Value Date/Time   NA 141 12/22/2020 1004   K 4.4 12/22/2020 1004   CL 106 12/22/2020 1004   CO2 27 12/22/2020 1004   BUN 12 12/22/2020 1004   CREATININE 1.04 12/22/2020 1004      Component Value Date/Time   CALCIUM 9.5 12/22/2020 1004   ALKPHOS 91 12/22/2020 1004   AST 24 12/22/2020 1004   ALT 14 12/22/2020 1004   BILITOT 0.3 12/22/2020 1004       RADIOGRAPHIC STUDIES: CT Chest W Contrast  Result Date: 12/19/2020 CLINICAL DATA:  A 81 year old male  presents for follow-up of non-small cell lung cancer, currently undergoing therapy. EXAM: CT CHEST, ABDOMEN, AND PELVIS WITH CONTRAST TECHNIQUE: Multidetector CT imaging of the chest, abdomen and pelvis was performed following the standard protocol during bolus administration of intravenous contrast. CONTRAST:  53m OMNIPAQUE IOHEXOL 350 MG/ML SOLN COMPARISON:  October 22, 2020. FINDINGS: CT CHEST FINDINGS Cardiovascular: Calcified atheromatous plaque in the thoracic aorta and noncalcified plaque with similar appearance to prior imaging. Heart size stable. No pericardial effusion. Central pulmonary vasculature is normal caliber. RIGHT-sided Port-A-Cath terminates at the caval to atrial junction. Mediastinum/Nodes: No thoracic inlet, axillary, mediastinal or hilar adenopathy. Esophagus grossly normal. Scattered small lymph nodes display no change largest along the RIGHT subcarinal region approximately 8 mm short axis. Lungs/Pleura: Interstitial thickening and basilar bronchiectasis without without signs of honeycombing showing a similar pattern to previous imaging. Peri fissural nodules in the LEFT chest along the major fissure unchanged. More focal areas of  ground-glass in the LEFT upper lobe show improvement since prior imaging, more generalized subpleural banding and ground-glass attenuation is present in the LEFT upper lobe and in the RIGHT upper lobe Bandlike area in the LEFT lower lobe with nodular terminal aspect at the periphery measuring approximately 6 mm greatest thickness (image 71/4) this is new with respect to the peripheral more nodular area and increased with respect to bandlike changes that extend from the hilum along the fissure. LEFT upper lobe nodule (image 61/4) 7 mm as compared to 8-9 mm on the previous study. No effusion. No consolidation. Musculoskeletal: See below for full musculoskeletal details. CT ABDOMEN PELVIS FINDINGS Hepatobiliary: No focal, suspicious hepatic lesion. No  pericholecystic stranding. No biliary duct dilation. Portal vein is patent. Pancreas: Normal, without mass, inflammation or ductal dilatation. Spleen: Spleen normal size and contour. Adrenals/Urinary Tract: Adrenal glands are unremarkable. Symmetric renal enhancement. No sign of hydronephrosis. No suspicious renal lesion or perinephric stranding. Urinary bladder is grossly unremarkable. Mild cortical scarring. Cyst arises from the anterior interpolar LEFT kidney. Stomach/Bowel: No acute gastrointestinal process. Normal appendix. Colonic diverticulosis. Vascular/Lymphatic: Aortic atherosclerosis. No sign of aneurysm. Smooth contour of the IVC. There is no gastrohepatic or hepatoduodenal ligament lymphadenopathy. No retroperitoneal or mesenteric lymphadenopathy. No pelvic sidewall lymphadenopathy. Atherosclerotic changes are mild to moderate. Reproductive: Unremarkable by CT with mild prostatomegaly. Other: No ascites.  No pneumoperitoneum.  No peritoneal nodularity. Musculoskeletal: No acute bone finding. No destructive bone process. Spinal degenerative changes. IMPRESSION: Pleural and parenchymal scarring developing amidst interstitial changes with mid and lower lung predominance not associated with bronchiectasis and with improving areas of focal ground-glass. Findings could be related to post infectious or inflammatory fibrosis with improvement with respect to patchy areas of ground-glass in the upper lobes. Reportedly the patient does have a history of COVID-19 infection, attention on follow-up. LEFT upper lobe nodule 7 mm as compared to 8-9 mm on the previous study. No new or progressive findings. Aortic atherosclerosis. Aortic Atherosclerosis (ICD10-I70.0). Electronically Signed   By: Zetta Bills M.D.   On: 12/19/2020 17:08   CT Abdomen Pelvis W Contrast  Result Date: 12/19/2020 CLINICAL DATA:  A 81 year old male presents for follow-up of non-small cell lung cancer, currently undergoing therapy. EXAM:  CT CHEST, ABDOMEN, AND PELVIS WITH CONTRAST TECHNIQUE: Multidetector CT imaging of the chest, abdomen and pelvis was performed following the standard protocol during bolus administration of intravenous contrast. CONTRAST:  17m OMNIPAQUE IOHEXOL 350 MG/ML SOLN COMPARISON:  October 22, 2020. FINDINGS: CT CHEST FINDINGS Cardiovascular: Calcified atheromatous plaque in the thoracic aorta and noncalcified plaque with similar appearance to prior imaging. Heart size stable. No pericardial effusion. Central pulmonary vasculature is normal caliber. RIGHT-sided Port-A-Cath terminates at the caval to atrial junction. Mediastinum/Nodes: No thoracic inlet, axillary, mediastinal or hilar adenopathy. Esophagus grossly normal. Scattered small lymph nodes display no change largest along the RIGHT subcarinal region approximately 8 mm short axis. Lungs/Pleura: Interstitial thickening and basilar bronchiectasis without without signs of honeycombing showing a similar pattern to previous imaging. Peri fissural nodules in the LEFT chest along the major fissure unchanged. More focal areas of ground-glass in the LEFT upper lobe show improvement since prior imaging, more generalized subpleural banding and ground-glass attenuation is present in the LEFT upper lobe and in the RIGHT upper lobe Bandlike area in the LEFT lower lobe with nodular terminal aspect at the periphery measuring approximately 6 mm greatest thickness (image 71/4) this is new with respect to the peripheral more nodular area and increased with respect to  bandlike changes that extend from the hilum along the fissure. LEFT upper lobe nodule (image 61/4) 7 mm as compared to 8-9 mm on the previous study. No effusion. No consolidation. Musculoskeletal: See below for full musculoskeletal details. CT ABDOMEN PELVIS FINDINGS Hepatobiliary: No focal, suspicious hepatic lesion. No pericholecystic stranding. No biliary duct dilation. Portal vein is patent. Pancreas: Normal, without  mass, inflammation or ductal dilatation. Spleen: Spleen normal size and contour. Adrenals/Urinary Tract: Adrenal glands are unremarkable. Symmetric renal enhancement. No sign of hydronephrosis. No suspicious renal lesion or perinephric stranding. Urinary bladder is grossly unremarkable. Mild cortical scarring. Cyst arises from the anterior interpolar LEFT kidney. Stomach/Bowel: No acute gastrointestinal process. Normal appendix. Colonic diverticulosis. Vascular/Lymphatic: Aortic atherosclerosis. No sign of aneurysm. Smooth contour of the IVC. There is no gastrohepatic or hepatoduodenal ligament lymphadenopathy. No retroperitoneal or mesenteric lymphadenopathy. No pelvic sidewall lymphadenopathy. Atherosclerotic changes are mild to moderate. Reproductive: Unremarkable by CT with mild prostatomegaly. Other: No ascites.  No pneumoperitoneum.  No peritoneal nodularity. Musculoskeletal: No acute bone finding. No destructive bone process. Spinal degenerative changes. IMPRESSION: Pleural and parenchymal scarring developing amidst interstitial changes with mid and lower lung predominance not associated with bronchiectasis and with improving areas of focal ground-glass. Findings could be related to post infectious or inflammatory fibrosis with improvement with respect to patchy areas of ground-glass in the upper lobes. Reportedly the patient does have a history of COVID-19 infection, attention on follow-up. LEFT upper lobe nodule 7 mm as compared to 8-9 mm on the previous study. No new or progressive findings. Aortic atherosclerosis. Aortic Atherosclerosis (ICD10-I70.0). Electronically Signed   By: Zetta Bills M.D.   On: 12/19/2020 17:08    ASSESSMENT AND PLAN: This is a very pleasant 81 years old white male recently diagnosed with a stage IV (T1b, N2, M1 a) non-small cell lung cancer, adenocarcinoma presented with left upper lobe lung nodule in addition to mediastinal lymphadenopathy and pleural-based metastasis as  well as malignant left pleural effusion diagnosed in July 2022. His molecular studies by [facility] showed no actionable mutations. The patient is currently undergoing systemic chemotherapy with carboplatin for AUC of 5, Alimta 500 Mg/M2 and Keytruda 200 Mg IV every 3 weeks.  He is status post 7 cycles.  Starting from cycle #5 the patient will be on treatment with Alimta and Keytruda every 3 weeks. The patient has been tolerating this treatment well with no concerning adverse effect except for mild fatigue and few episodes of nausea few days after the treatment. I recommended for him to proceed with cycle #8 today as planned. For the nausea he was advised to take Zofran on as-needed basis. I will see him back for follow-up visit in 3 weeks for evaluation before the next cycle of his treatment. The patient was advised to call immediately if he has any other concerning symptoms in the interval. The patient voices understanding of current disease status and treatment options and is in agreement with the current care plan.  All questions were answered. The patient knows to call the clinic with any problems, questions or concerns. We can certainly see the patient much sooner if necessary.  Disclaimer: This note was dictated with voice recognition software. Similar sounding words can inadvertently be transcribed and may not be corrected upon review.

## 2021-01-12 NOTE — Patient Instructions (Signed)
[facility]   Discharge Instructions: Thank you for choosing [facility] to provide your oncology and hematology care.   If you have a lab appointment with the [facility], please go directly to the [facility] and check in at the registration area.   Wear comfortable clothing and clothing appropriate for easy access to any Portacath or PICC line.   We strive to give you quality time with your provider. You may need to reschedule your appointment if you arrive late (15 or more minutes).  Arriving late affects you and other patients whose appointments are after yours.  Also, if you miss three or more appointments without notifying the office, you may be dismissed from the clinic at the providers discretion.      For prescription refill requests, have your pharmacy contact our office and allow 72 hours for refills to be completed.    Today you received the following chemotherapy and/or immunotherapy agents: pembrolizumab and pemetrexed      To help prevent nausea and vomiting after your treatment, we encourage you to take your nausea medication as directed.  BELOW ARE SYMPTOMS THAT SHOULD BE REPORTED IMMEDIATELY: *FEVER GREATER THAN 100.4 F (38 C) OR HIGHER *CHILLS OR SWEATING *NAUSEA AND VOMITING THAT IS NOT CONTROLLED WITH YOUR NAUSEA MEDICATION *UNUSUAL SHORTNESS OF BREATH *UNUSUAL BRUISING OR BLEEDING *URINARY PROBLEMS (pain or burning when urinating, or frequent urination) *BOWEL PROBLEMS (unusual diarrhea, constipation, pain near the anus) TENDERNESS IN MOUTH AND THROAT WITH OR WITHOUT PRESENCE OF ULCERS (sore throat, sores in mouth, or a toothache) UNUSUAL RASH, SWELLING OR PAIN  UNUSUAL VAGINAL DISCHARGE OR ITCHING   Items with * indicate a potential emergency and should be followed up as soon as possible or go to the Emergency Department if any problems should occur.  Please show the CHEMOTHERAPY ALERT CARD or IMMUNOTHERAPY ALERT  CARD at check-in to the Emergency Department and triage nurse.  Should you have questions after your visit or need to cancel or reschedule your appointment, please contact [facility]  Dept: [phone]  and follow the prompts.  Office hours are 8:00 a.m. to 4:30 p.m. Monday - Friday. Please note that voicemails left after 4:00 p.m. may not be returned until the following business day.  We are closed weekends and major holidays. You have access to a nurse at all times for urgent questions. Please call the main number to the clinic Dept: [phone] and follow the prompts.   For any non-urgent questions, you may also contact your provider using MyChart. We now offer e-Visits for anyone 2 and older to request care online for non-urgent symptoms. For details visit [email or web address].   Also download the MyChart app! Go to the app store, search "MyChart", open the app, select [facility], and log in with your MyChart username and password.  Due to Covid, a mask is required upon entering the hospital/clinic. If you do not have a mask, one will be given to you upon arrival. For doctor visits, patients may have 1 support person aged 20 or older with them. For treatment visits, patients cannot have anyone with them due to current Covid guidelines and our immunocompromised population.

## 2021-01-13 ENCOUNTER — Telehealth: Payer: Self-pay | Admitting: Medical Oncology

## 2021-01-13 DIAGNOSIS — R110 Nausea: Secondary | ICD-10-CM

## 2021-01-13 DIAGNOSIS — Z5111 Encounter for antineoplastic chemotherapy: Secondary | ICD-10-CM

## 2021-01-13 MED ORDER — ONDANSETRON 4 MG PO TBDP: 4.0000 mg | ORAL_TABLET | Freq: Three times a day (TID) | ORAL | 0 refills | Status: DC | PRN

## 2021-01-13 NOTE — Telephone Encounter (Signed)
Requests refill for Ondansetron ODT. [facility] [place], [place]

## 2021-01-20 ENCOUNTER — Other Ambulatory Visit: Payer: Medicare HMO

## 2021-01-20 ENCOUNTER — Ambulatory Visit: Payer: Medicare HMO | Admitting: Physician Assistant

## 2021-01-20 ENCOUNTER — Ambulatory Visit: Payer: Medicare HMO

## 2021-01-22 ENCOUNTER — Other Ambulatory Visit: Payer: Self-pay | Admitting: Internal Medicine

## 2021-01-23 ENCOUNTER — Other Ambulatory Visit: Payer: Self-pay

## 2021-01-23 DIAGNOSIS — C7931 Secondary malignant neoplasm of brain: Secondary | ICD-10-CM

## 2021-01-26 ENCOUNTER — Other Ambulatory Visit: Payer: Self-pay

## 2021-02-02 ENCOUNTER — Inpatient Hospital Stay: Payer: Medicare HMO

## 2021-02-02 ENCOUNTER — Encounter: Payer: Self-pay | Admitting: Internal Medicine

## 2021-02-02 ENCOUNTER — Other Ambulatory Visit: Payer: Medicare HMO

## 2021-02-02 ENCOUNTER — Inpatient Hospital Stay: Payer: Medicare HMO | Admitting: Internal Medicine

## 2021-02-02 ENCOUNTER — Other Ambulatory Visit: Payer: Self-pay

## 2021-02-02 VITALS — BP 128/73 | HR 81 | Temp 98.0°F | Resp 20 | Ht 73.0 in | Wt 184.7 lb

## 2021-02-02 DIAGNOSIS — C3490 Malignant neoplasm of unspecified part of unspecified bronchus or lung: Secondary | ICD-10-CM

## 2021-02-02 DIAGNOSIS — C3492 Malignant neoplasm of unspecified part of left bronchus or lung: Secondary | ICD-10-CM

## 2021-02-02 DIAGNOSIS — Z95828 Presence of other vascular implants and grafts: Secondary | ICD-10-CM

## 2021-02-02 DIAGNOSIS — Z5112 Encounter for antineoplastic immunotherapy: Secondary | ICD-10-CM | POA: Diagnosis not present

## 2021-02-02 LAB — CBC WITH DIFFERENTIAL (CANCER CENTER ONLY)
Abs Immature Granulocytes: 0.02 10*3/uL (ref 0.00–0.07)
Basophils Absolute: 0.1 10*3/uL (ref 0.0–0.1)
Basophils Relative: 1 %
Eosinophils Absolute: 0.4 10*3/uL (ref 0.0–0.5)
Eosinophils Relative: 8 %
HCT: 34.5 % — ABNORMAL LOW (ref 39.0–52.0)
Hemoglobin: 11.6 g/dL — ABNORMAL LOW (ref 13.0–17.0)
Immature Granulocytes: 0 %
Lymphocytes Relative: 25 %
Lymphs Abs: 1.4 10*3/uL (ref 0.7–4.0)
MCH: 33 pg (ref 26.0–34.0)
MCHC: 33.6 g/dL (ref 30.0–36.0)
MCV: 98.3 fL (ref 80.0–100.0)
Monocytes Absolute: 0.8 10*3/uL (ref 0.1–1.0)
Monocytes Relative: 14 %
Neutro Abs: 2.8 10*3/uL (ref 1.7–7.7)
Neutrophils Relative %: 52 %
Platelet Count: 213 10*3/uL (ref 150–400)
RBC: 3.51 MIL/uL — ABNORMAL LOW (ref 4.22–5.81)
RDW: 13.6 % (ref 11.5–15.5)
WBC Count: 5.5 10*3/uL (ref 4.0–10.5)
nRBC: 0 % (ref 0.0–0.2)

## 2021-02-02 LAB — CMP (CANCER CENTER ONLY)
ALT: 13 U/L (ref 0–44)
AST: 22 U/L (ref 15–41)
Albumin: 4 g/dL (ref 3.5–5.0)
Alkaline Phosphatase: 72 U/L (ref 38–126)
Anion gap: 6 (ref 5–15)
BUN: 12 mg/dL (ref 8–23)
CO2: 28 mmol/L (ref 22–32)
Calcium: 9.6 mg/dL (ref 8.9–10.3)
Chloride: 104 mmol/L (ref 98–111)
Creatinine: 1.05 mg/dL (ref 0.61–1.24)
GFR, Estimated: >60 mL/min (ref >60)
Glucose, Bld: 118 mg/dL — ABNORMAL HIGH (ref 70–99)
Potassium: 4.7 mmol/L (ref 3.5–5.1)
Sodium: 138 mmol/L (ref 135–145)
Total Bilirubin: 0.3 mg/dL (ref 0.3–1.2)
Total Protein: 6.8 g/dL (ref 6.5–8.1)

## 2021-02-02 LAB — TSH: TSH: 3.481 u[IU]/mL (ref 0.320–4.118)

## 2021-02-02 MED ORDER — CYANOCOBALAMIN 1000 MCG/ML IJ SOLN
1000.0000 ug | Freq: Once | INTRAMUSCULAR | Status: AC
  Administered 2021-02-02: 1000 ug via INTRAMUSCULAR
  Filled 2021-02-02: qty 1

## 2021-02-02 MED ORDER — SODIUM CHLORIDE 0.9 % IV SOLN: Freq: Once | INTRAVENOUS | Status: AC

## 2021-02-02 MED ORDER — SODIUM CHLORIDE 0.9% FLUSH
10.0000 mL | Freq: Once | INTRAVENOUS | Status: AC
  Administered 2021-02-02: 10 mL

## 2021-02-02 MED ORDER — SODIUM CHLORIDE 0.9 % IV SOLN
200.0000 mg | Freq: Once | INTRAVENOUS | Status: AC
  Administered 2021-02-02: 200 mg via INTRAVENOUS
  Filled 2021-02-02: qty 200

## 2021-02-02 MED ORDER — HEPARIN SOD (PORK) LOCK FLUSH 100 UNIT/ML IV SOLN
500.0000 [IU] | Freq: Once | INTRAVENOUS | Status: AC | PRN
  Administered 2021-02-02: 500 [IU]

## 2021-02-02 MED ORDER — SODIUM CHLORIDE 0.9% FLUSH
10.0000 mL | INTRAVENOUS | Status: DC | PRN
  Administered 2021-02-02: 10 mL

## 2021-02-02 MED ORDER — ALTEPLASE 2 MG IJ SOLR
2.0000 mg | Freq: Once | INTRAMUSCULAR | Status: AC
  Administered 2021-02-02: 2 mg
  Filled 2021-02-02: qty 2

## 2021-02-02 MED ORDER — SODIUM CHLORIDE 0.9 % IV SOLN
500.0000 mg/m2 | Freq: Once | INTRAVENOUS | Status: AC
  Administered 2021-02-02: 1000 mg via INTRAVENOUS
  Filled 2021-02-02: qty 40

## 2021-02-02 MED ORDER — PROCHLORPERAZINE MALEATE 10 MG PO TABS
10.0000 mg | ORAL_TABLET | Freq: Once | ORAL | Status: AC
  Administered 2021-02-02: 10 mg via ORAL
  Filled 2021-02-02: qty 1

## 2021-02-02 NOTE — Progress Notes (Signed)
°    [facility] °[phone]   [phone] ° °OFFICE PROGRESS NOTE ° °Eason, Paul, MD °[address] °[place] [address] ° °DIAGNOSIS: Stage IV (T1b, N2, M1a) non-small cell lung cancer, adenocarcinoma diagnosed in July 2022 and presented with left upper lobe nodule in addition to AP window lymphadenopathy and left-sided malignant pleural effusion as well as pleural metastatic disease.  The patient also has solitary left temporal brain metastasis. ° °Biomarker Findings °Microsatellite status - MS-Stable °Tumor Mutational Burden - 4 Muts/Mb °Genomic Findings °For a complete list of the genes assayed, please refer to the Appendix. °PIK3CA E545K °PTEN splice site 635-1G>[id number] - subclonal† °DOT1L S911L - subclonal† °RAD21 S271* °RB1 loss exons 3-23 °TP53 N311fs*34 °8 Disease relevant genes with no reportable °alterations: ALK, BRAF, EGFR, ERBB2, KRAS, MET, RET, °ROS1 ° °PRIOR THERAPY: None ° °CURRENT THERAPY: Systemic chemotherapy with carboplatin for AUC of 5, Alimta 500 Mg/M2 and Keytruda 200 Mg IV every 3 weeks.  First dose August 13, 2020.  Status post 8 cycles. ° °INTERVAL HISTORY: °Peter Brown 80 y.o. male returns to the clinic today for follow-up visit accompanied by his wife.  The patient is feeling fine today with no concerning complaints except for mild fatigue.  He also has few days of low-grade fever and chills last week improved with Tylenol.  He denied having any current fever or chills.  He has no nausea, vomiting, diarrhea or constipation.  He has no headache or visual changes.  He denied having any significant weight loss or night sweats.  The patient has intermittent left-sided chest pain but shortness of breath, cough or hemoptysis.  He is here today for evaluation before starting cycle #9 of his treatment. ° °MEDICAL HISTORY: °Past Medical History:  °Diagnosis Date  ° Lung cancer (HCC)   ° Lung cancer metastatic to brain (HCC)   ° ° °ALLERGIES:  has no active  allergies. ° °MEDICATIONS:  °Current Outpatient Medications  °Medication Sig Dispense Refill  ° aspirin 81 MG EC tablet Take by mouth.    ° Bacillus Coagulans-Inulin (PROBIOTIC) 1-250 BILLION-MG CAPS Take 1 tablet by mouth daily.    ° bisacodyl (DULCOLAX) 5 MG EC tablet Take 5 mg by mouth 3 (three) times daily as needed for moderate constipation.    ° coal tar-salicylic acid 2 % shampoo Apply topically daily as needed for itching.    ° DULoxetine (CYMBALTA) 30 MG capsule Take 30 mg by mouth daily.    ° fluocinonide cream (LIDEX) 0.05 % Apply 1 application topically 2 (two) times daily.    ° folic acid (FOLVITE) 1 MG tablet TAKE 1 TABLET(1 MG) BY MOUTH DAILY 30 tablet 4  ° Glucosamine-Chondroitin 250-200 MG TABS Take 1 tablet by mouth daily.    ° hydrocortisone cream 1 % Apply 1 application topically 2 (two) times daily. 453.6 g 0  ° ketoconazole (NIZORAL) 2 % cream Apply 1 application topically daily.    ° Krill Oil (OMEGA-3) 500 MG CAPS Take by mouth.    ° lidocaine-prilocaine (EMLA) cream Apply 1 application topically as needed. 30 g 1  ° loratadine (CLARITIN) 10 MG tablet Take by mouth.    ° Magnesium 250 MG TABS Take by mouth.    ° omeprazole (PRILOSEC) 40 MG capsule Take 40 mg by mouth daily.    ° ondansetron (ZOFRAN-ODT) 4 MG disintegrating tablet Take 1 tablet (4 mg total) by mouth every 8 (eight) hours as needed. 20 tablet 0  ° prochlorperazine (COMPAZINE) 10   10 MG tablet TAKE 1 TABLET(10 MG) BY MOUTH EVERY 6 HOURS AS NEEDED FOR NAUSEA OR VOMITING 30 tablet 0   triamcinolone cream (KENALOG) 0.1 % Apply 1 application topically daily as needed. 453 g 6   No current facility-administered medications for this visit.    SURGICAL HISTORY:  Past Surgical History:  Procedure Laterality Date   IR IMAGING GUIDED PORT INSERTION  11/24/2020   KNEE SURGERY     orthoscopic     TONSILLECTOMY AND ADENOIDECTOMY      REVIEW OF SYSTEMS:  A comprehensive review of systems was negative except for: Constitutional:  positive for fatigue Respiratory: positive for pleurisy/chest pain   PHYSICAL EXAMINATION: General appearance: alert, cooperative, fatigued, and no distress Head: Normocephalic, without obvious abnormality, atraumatic Neck: no adenopathy, no JVD, supple, symmetrical, trachea midline, and thyroid not enlarged, symmetric, no tenderness/mass/nodules Lymph nodes: Cervical, supraclavicular, and axillary nodes normal. Resp: clear to auscultation bilaterally Back: symmetric, no curvature. ROM normal. No CVA tenderness. Cardio: regular rate and rhythm, S1, S2 normal, no murmur, click, rub or gallop GI: soft, non-tender; bowel sounds normal; no masses,  no organomegaly Extremities: extremities normal, atraumatic, no cyanosis or edema  ECOG PERFORMANCE STATUS: 1 - Symptomatic but completely ambulatory  Blood pressure 128/73, pulse 81, temperature 98 F (36.7 C), temperature source Oral, resp. rate 20, height 6' 1" (1.854 m), weight 184 lb 11.2 oz (83.8 kg), SpO2 97 %.  LABORATORY DATA: Lab Results  Component Value Date   WBC 5.5 02/02/2021   HGB 11.6 (L) 02/02/2021   HCT 34.5 (L) 02/02/2021   MCV 98.3 02/02/2021   PLT 213 02/02/2021      Chemistry      Component Value Date/Time   NA 139 01/12/2021 0956   K 4.8 01/12/2021 0956   CL 105 01/12/2021 0956   CO2 29 01/12/2021 0956   BUN 13 01/12/2021 0956   CREATININE 0.99 01/12/2021 0956      Component Value Date/Time   CALCIUM 9.6 01/12/2021 0956   ALKPHOS 74 01/12/2021 0956   AST 22 01/12/2021 0956   ALT 11 01/12/2021 0956   BILITOT 0.3 01/12/2021 0956       RADIOGRAPHIC STUDIES: No results found.  ASSESSMENT AND PLAN: This is a very pleasant 81 years old white male recently diagnosed with a stage IV (T1b, N2, M1 a) non-small cell lung cancer, adenocarcinoma presented with left upper lobe lung nodule in addition to mediastinal lymphadenopathy and pleural-based metastasis as well as malignant left pleural effusion diagnosed in  July 2022. His molecular studies by foundation 1 showed no actionable mutations. The patient is currently undergoing systemic chemotherapy with carboplatin for AUC of 5, Alimta 500 Mg/M2 and Keytruda 200 Mg IV every 3 weeks.  He is status post 8 cycles.  Starting from cycle #5 the patient will be on treatment with Alimta and Keytruda every 3 weeks. The patient has been tolerating this treatment well with no concerning adverse effects except for mild fatigue. I recommended for him to proceed with cycle #9 today as planned. I will see him back for follow-up visit in 3 weeks for evaluation with repeat CT scan of the chest, abdomen and pelvis for restaging of his disease. The patient was advised to call immediately if he has any other concerning symptoms in the interval. The patient voices understanding of current disease status and treatment options and is in agreement with the current care plan.  All questions were answered. The patient knows to call the clinic  any problems, questions or concerns. We can certainly see the patient much sooner if necessary. ° °Disclaimer: This note was dictated with voice recognition software. Similar sounding words can inadvertently be transcribed and may not be corrected upon review. ° ° °  °   °

## 2021-02-02 NOTE — Patient Instructions (Signed)
[facility]   Discharge Instructions: Thank you for choosing [facility] to provide your oncology and hematology care.   If you have a lab appointment with the [facility], please go directly to the [facility] and check in at the registration area.   Wear comfortable clothing and clothing appropriate for easy access to any Portacath or PICC line.   We strive to give you quality time with your provider. You may need to reschedule your appointment if you arrive late (15 or more minutes).  Arriving late affects you and other patients whose appointments are after yours.  Also, if you miss three or more appointments without notifying the office, you may be dismissed from the clinic at the providers discretion.      For prescription refill requests, have your pharmacy contact our office and allow 72 hours for refills to be completed.    Today you received the following chemotherapy and/or immunotherapy agents: pembrolizumab and pemetrexed      To help prevent nausea and vomiting after your treatment, we encourage you to take your nausea medication as directed.  BELOW ARE SYMPTOMS THAT SHOULD BE REPORTED IMMEDIATELY: *FEVER GREATER THAN 100.4 F (38 C) OR HIGHER *CHILLS OR SWEATING *NAUSEA AND VOMITING THAT IS NOT CONTROLLED WITH YOUR NAUSEA MEDICATION *UNUSUAL SHORTNESS OF BREATH *UNUSUAL BRUISING OR BLEEDING *URINARY PROBLEMS (pain or burning when urinating, or frequent urination) *BOWEL PROBLEMS (unusual diarrhea, constipation, pain near the anus) TENDERNESS IN MOUTH AND THROAT WITH OR WITHOUT PRESENCE OF ULCERS (sore throat, sores in mouth, or a toothache) UNUSUAL RASH, SWELLING OR PAIN  UNUSUAL VAGINAL DISCHARGE OR ITCHING   Items with * indicate a potential emergency and should be followed up as soon as possible or go to the Emergency Department if any problems should occur.  Please show the CHEMOTHERAPY ALERT CARD or IMMUNOTHERAPY ALERT  CARD at check-in to the Emergency Department and triage nurse.  Should you have questions after your visit or need to cancel or reschedule your appointment, please contact [facility]  Dept: [phone]  and follow the prompts.  Office hours are 8:00 a.m. to 4:30 p.m. Monday - Friday. Please note that voicemails left after 4:00 p.m. may not be returned until the following business day.  We are closed weekends and major holidays. You have access to a nurse at all times for urgent questions. Please call the main number to the clinic Dept: [phone] and follow the prompts.   For any non-urgent questions, you may also contact your provider using MyChart. We now offer e-Visits for anyone 62 and older to request care online for non-urgent symptoms. For details visit [email or web address].   Also download the MyChart app! Go to the app store, search "MyChart", open the app, select [facility], and log in with your MyChart username and password.  Due to Covid, a mask is required upon entering the hospital/clinic. If you do not have a mask, one will be given to you upon arrival. For doctor visits, patients may have 1 support person aged 80 or older with them. For treatment visits, patients cannot have anyone with them due to current Covid guidelines and our immunocompromised population.

## 2021-02-19 ENCOUNTER — Encounter (HOSPITAL_COMMUNITY): Payer: Self-pay

## 2021-02-19 ENCOUNTER — Other Ambulatory Visit: Payer: Medicare HMO

## 2021-02-19 ENCOUNTER — Ambulatory Visit (HOSPITAL_COMMUNITY)
Admission: RE | Admit: 2021-02-19 | Discharge: 2021-02-19 | Disposition: A | Discharge status: Home / Self Care | Payer: Medicare HMO | Source: Ambulatory Visit | Attending: Internal Medicine | Admitting: Internal Medicine

## 2021-02-19 ENCOUNTER — Other Ambulatory Visit: Payer: Self-pay

## 2021-02-19 DIAGNOSIS — C3490 Malignant neoplasm of unspecified part of unspecified bronchus or lung: Secondary | ICD-10-CM | POA: Diagnosis present

## 2021-02-19 IMAGING — CT CT ABD-PELV W/ CM
2 of 5 series · 12 of 36 positions shown, 15 images · IV contrast (OMNIPAQUE 300)
Comparison: CT abdomen pelvis [DATE], CT chest, abdomen and
pelvis [DATE]. PET-CT [DATE].

CLINICAL DATA: Primary Cancer Type: Lung
TECHNIQUE: Multidetector CT imaging of the chest, abdomen and pelvis was
performed following the standard protocol during bolus
administration of intravenous contrast.

[Series 3: cap with · axial · 0.77mm/px · z∈[-649,-84]mm · 9 of 139 slices shown, 12 images]
[im 13/139  mediastinal]
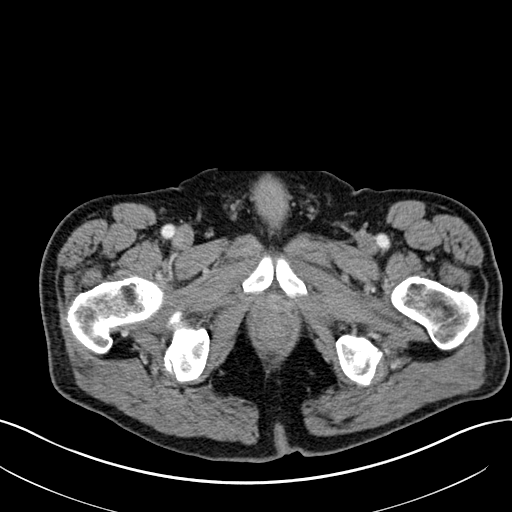
[im 13/139  lung]
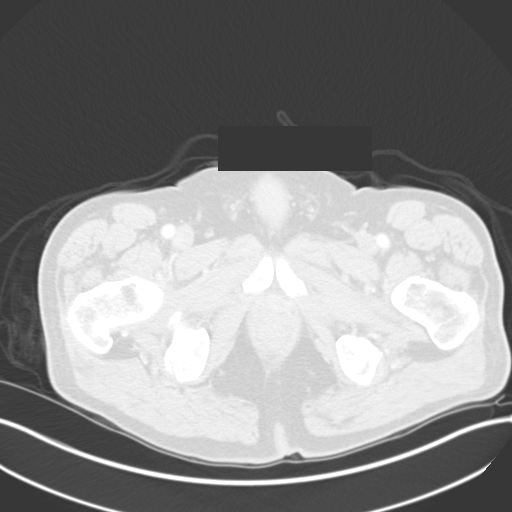
[im 26/139  lung]
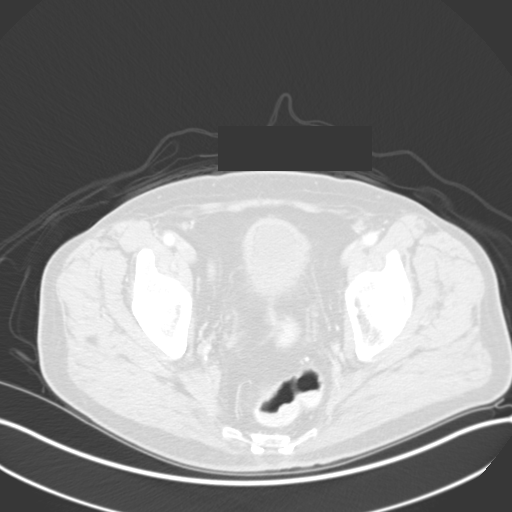
[im 38/139  lung]
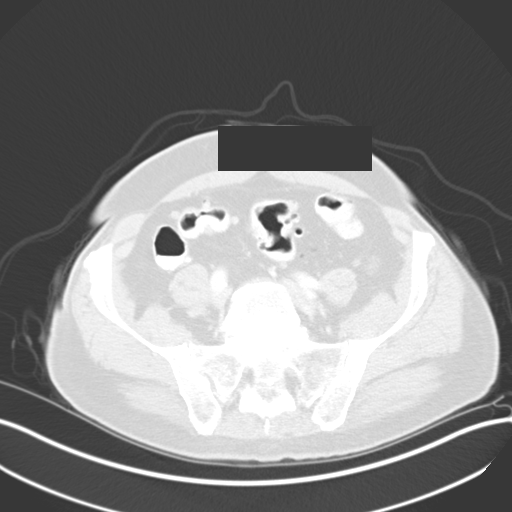
[im 51/139  lung]
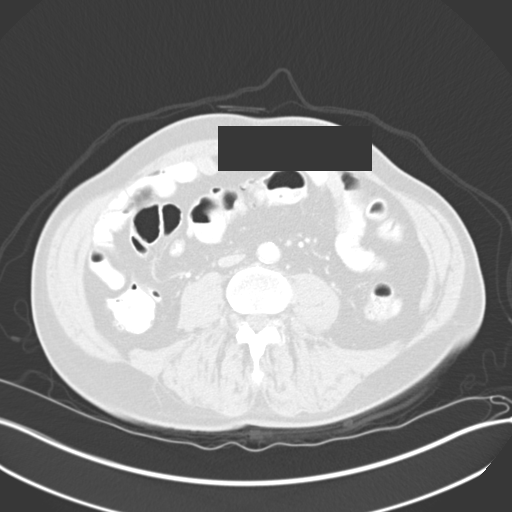
[im 76/139  mediastinal]
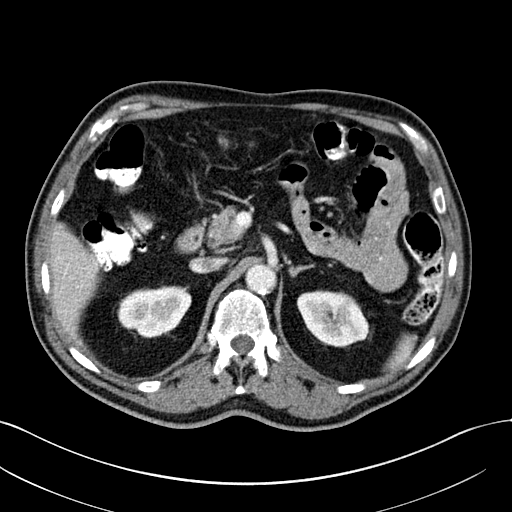
[im 76/139  lung]
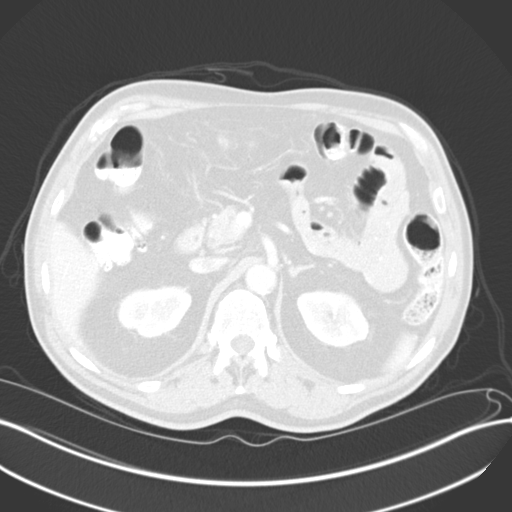
[im 88/139  lung]
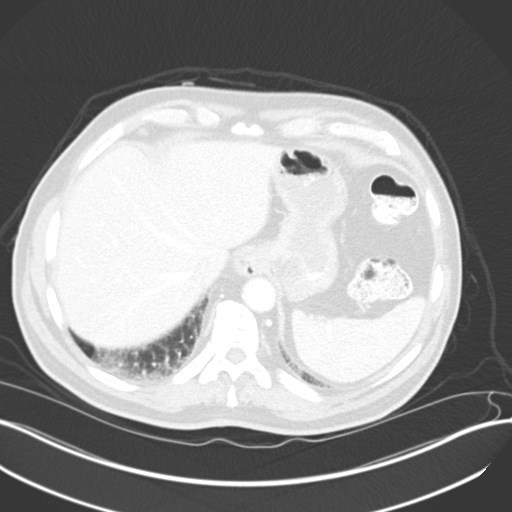
[im 101/139  lung]
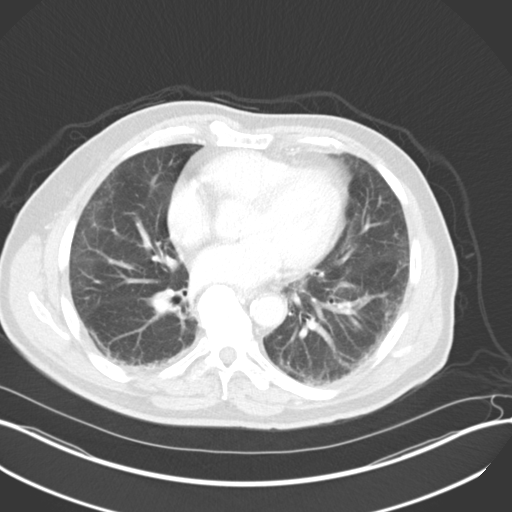
[im 113/139  lung]
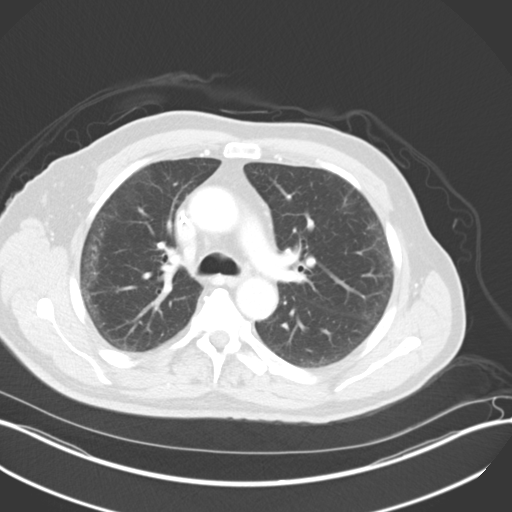
[im 126/139  mediastinal]
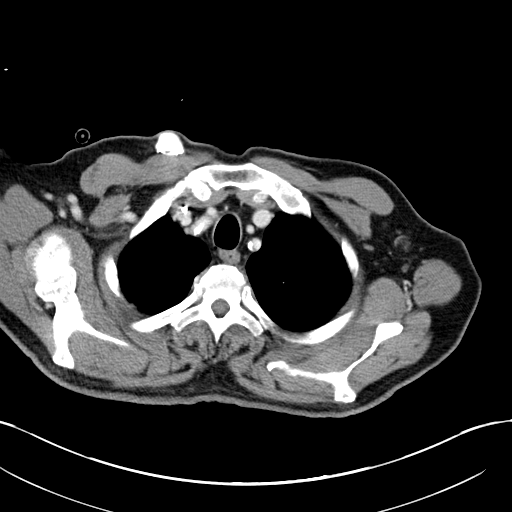
[im 126/139  lung]
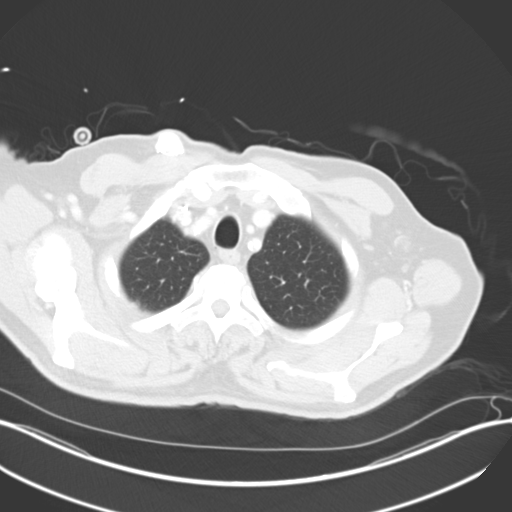

[Series 5: coronals · coronal · 0.77mm/px · 3 of 159 slices shown]
[im 32/159  lung]
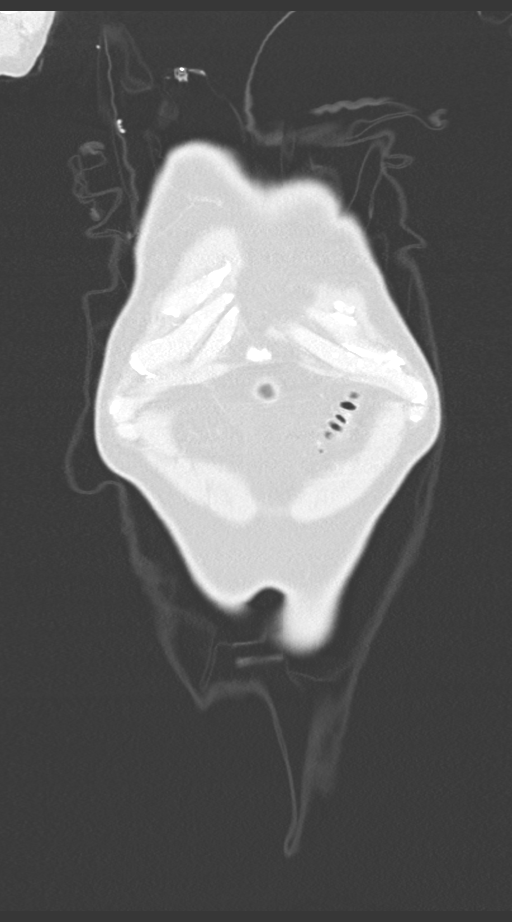
[im 64/159  lung]
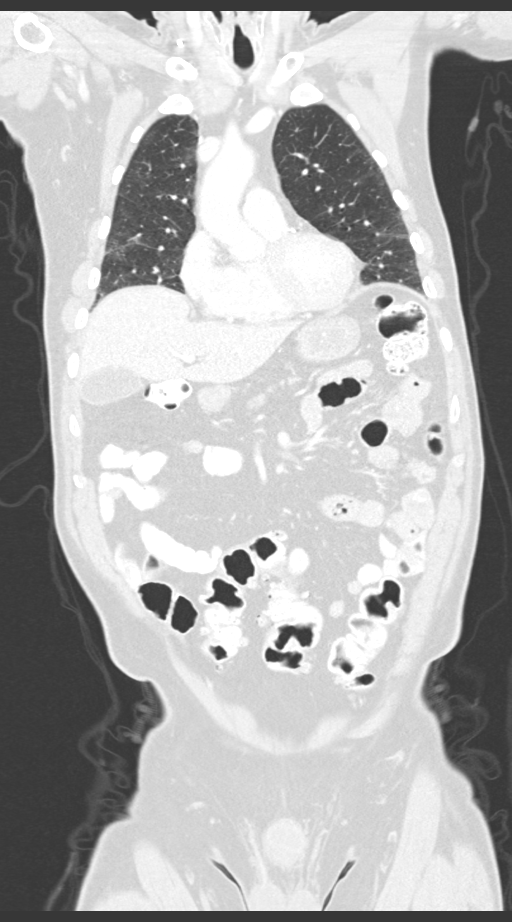
[im 95/159  lung]
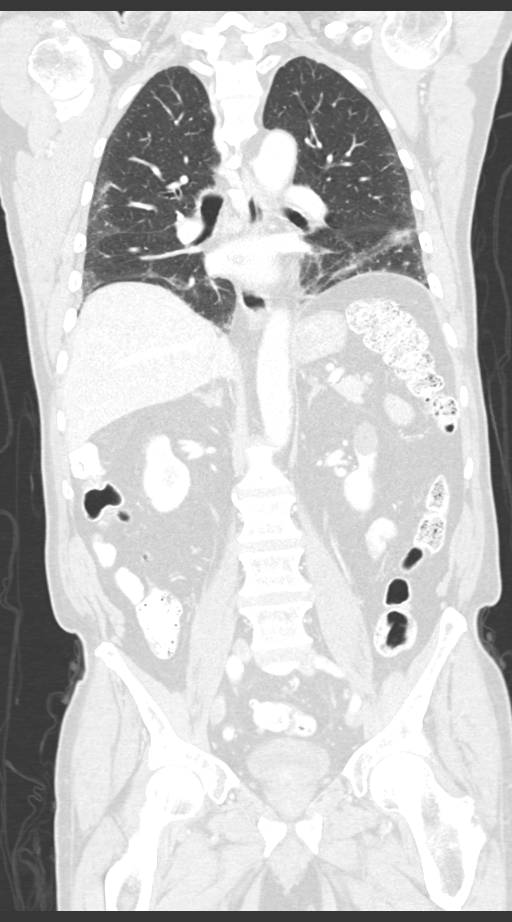

[12 of 36 positions shown; findings below may reference images not displayed]

Imaging Indication: Assess response to therapy

Interval therapy since last imaging? Yes

Initial Cancer Diagnosis

Date: [DATE]; Established by: Biopsy-proven

Detailed Pathology: Stage IV non-small cell lung cancer,
adenocarcinoma.

Primary Tumor location:  Left upper lobe.

Surgeries: No.

Chemotherapy: Yes; Ongoing? Yes; Most recent administration:
[DATE]

Immunotherapy?  Yes; Type: Keytruda; Ongoing? Yes

Radiation therapy? Yes; Date Range: [DATE]; Target: SRS to the
metastatic brain lesion

EXAM:
CT CHEST, ABDOMEN, AND PELVIS WITH CONTRAST
RADIATION DOSE REDUCTION: This exam was performed according to the
departmental dose-optimization program which includes automated
exposure control, adjustment of the mA and/or kV according to
patient size and/or use of iterative reconstruction technique.

CONTRAST:  100mL OMNIPAQUE IOHEXOL 300 MG/ML  SOLN
FINDINGS: CT CHEST FINDINGS

Cardiovascular: Right IJ Port-A-Cath terminates in the high right
atrium. Atherosclerotic calcification of the aorta, aortic valve and
coronary arteries. Heart size within normal limits. No pericardial
effusion.

Mediastinum/Nodes: No pathologically enlarged mediastinal, hilar or
axillary lymph nodes. Esophagus is unremarkable.

Lungs/Pleura: Basilar predominant subpleural ground-glass and mild
traction bronchiectasis/bronchiolectasis. Findings are similar to
[DATE]. Subpleural nodular density in the anterior left upper
lobe measures 5 mm (7/77), unchanged. Slightly more inferiorly is a
new 6 mm subpleural nodule in the anteromedial left upper lobe
(7/81). No pleural fluid. Airway is unremarkable.

Musculoskeletal: Degenerative changes in the spine. No worrisome
lytic or sclerotic lesions.

CT ABDOMEN PELVIS FINDINGS

Hepatobiliary: Liver and gallbladder are unremarkable. No biliary
ductal dilatation.

Pancreas: Negative.

Spleen: Negative.

Adrenals/Urinary Tract: Adrenal glands are unremarkable. Scarring in
the kidneys. Subcentimeter low-attenuation lesion in the interpolar
right kidney is too small to characterize. 1.9 cm fluid density
lesion in the left kidney is consistent with a cyst. Kidneys are
otherwise unremarkable. Ureters are decompressed. Bladder is low in
volume.

Stomach/Bowel: Tiny hiatal hernia. Stomach, small bowel, appendix
and colon are unremarkable.

Vascular/Lymphatic: Atherosclerotic calcification of the aorta. No
pathologically enlarged lymph nodes.

Reproductive: Prostate may be minimally prominent.

Other: No free fluid.  Mesenteries and peritoneum are unremarkable.

Musculoskeletal: Grade 2 anterolisthesis of L5 on S1 secondary to
bilateral L5 pars defects. Advanced secondary degenerative disc
disease. No worrisome lytic or sclerotic lesions.
IMPRESSION: 1. New 6 mm subpleural anteromedial left upper lobe nodule.
Short-term follow-up CT chest without contrast in 3 months is
recommended.
2. Basilar predominant subpleural ground-glass and traction
bronchiectasis/bronchiolectasis, similar to [DATE]. Findings can
be seen with interstitial lung disease such as nonspecific
interstitial pneumonitis. Usual interstitial pneumonitis is not
excluded.
3. Aortic atherosclerosis ([6I]-[6I]). Coronary artery
calcification.

## 2021-02-19 IMAGING — CT CT CHEST W/ CM
2 of 5 series · 12 of 36 positions shown, 15 images · IV contrast (OMNIPAQUE 300)
Comparison: CT abdomen pelvis [DATE], CT chest, abdomen and
pelvis [DATE]. PET-CT [DATE].

CLINICAL DATA: Primary Cancer Type: Lung
TECHNIQUE: Multidetector CT imaging of the chest, abdomen and pelvis was
performed following the standard protocol during bolus
administration of intravenous contrast.

[Series 3: cap with · axial · 0.77mm/px · z∈[-649,-84]mm · 9 of 139 slices shown, 12 images]
[im 13/139  mediastinal]
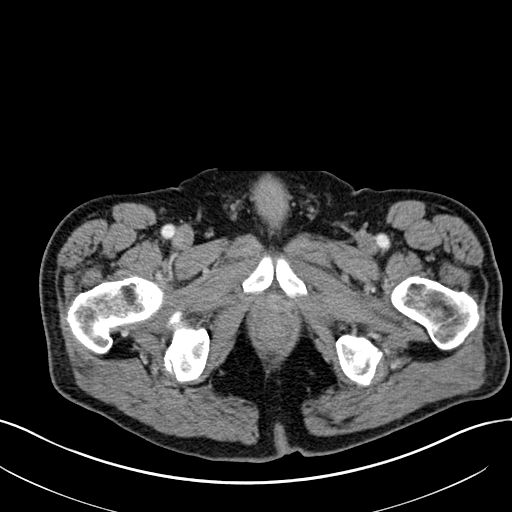
[im 13/139  lung]
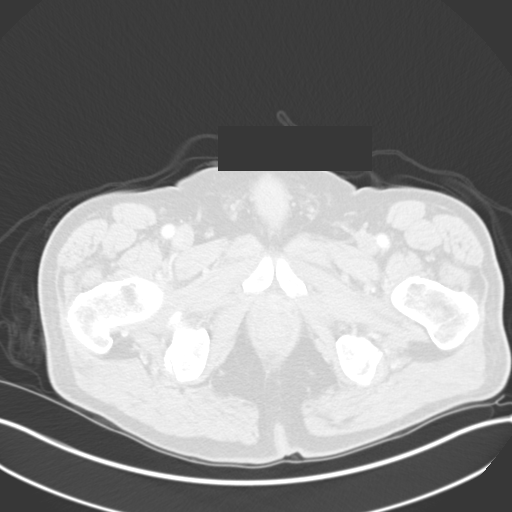
[im 26/139  lung]
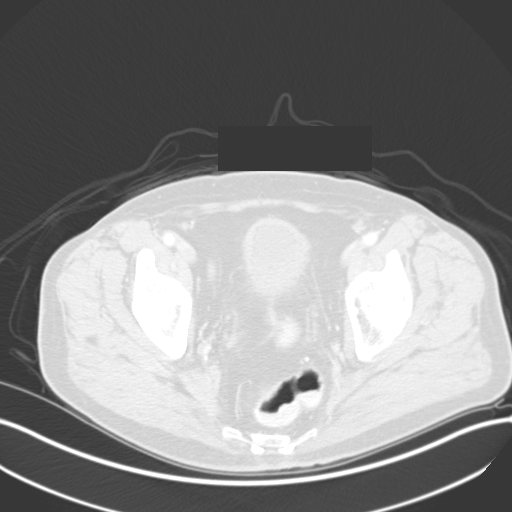
[im 38/139  lung]
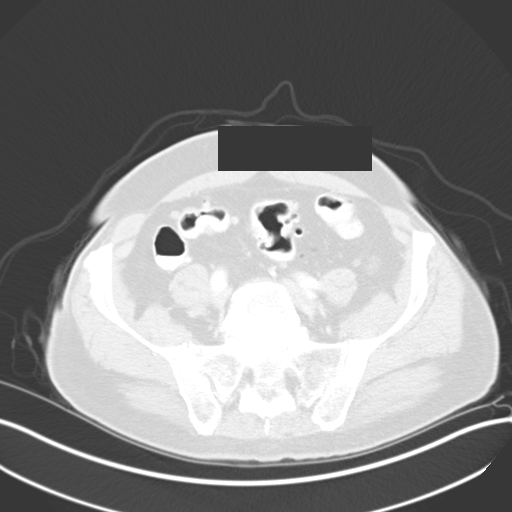
[im 51/139  lung]
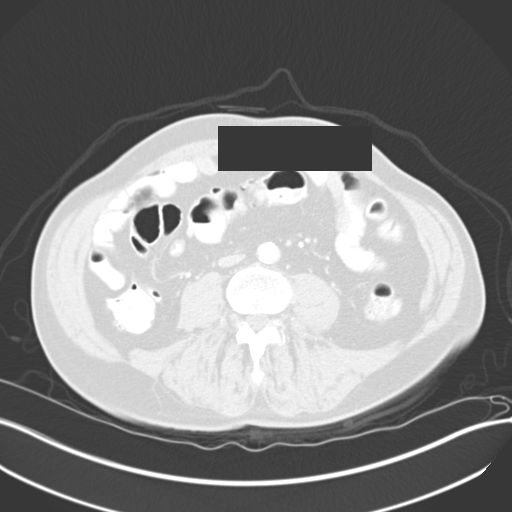
[im 76/139  mediastinal]
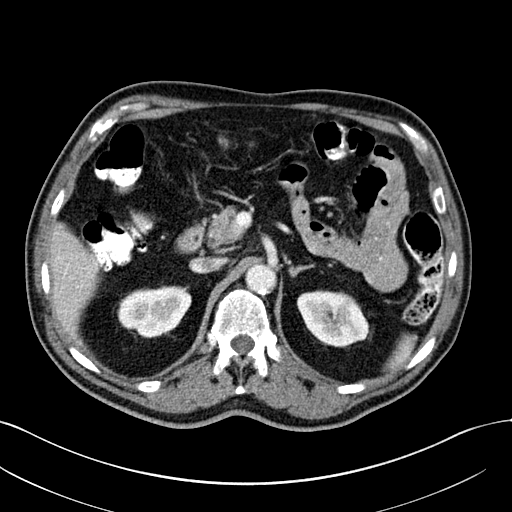
[im 76/139  lung]
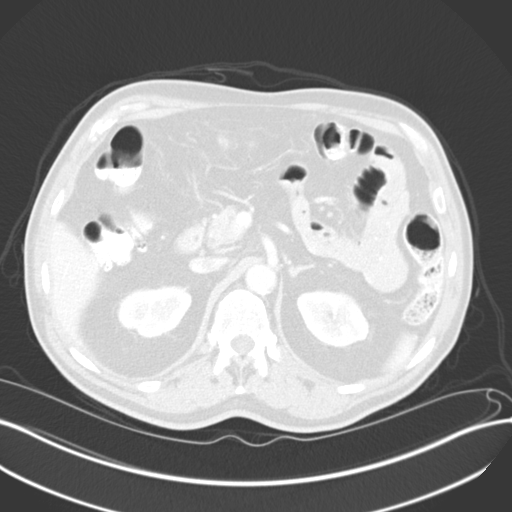
[im 88/139  lung]
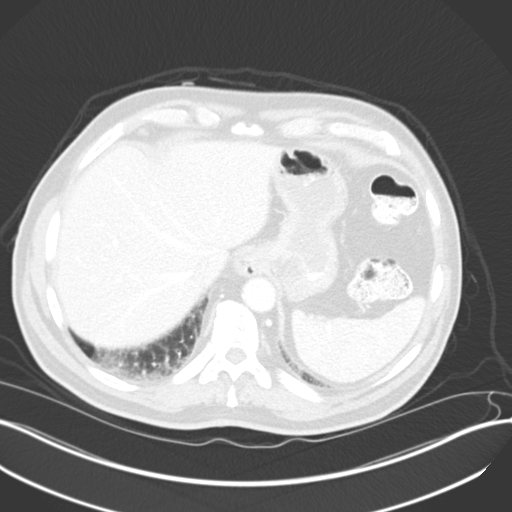
[im 101/139  lung]
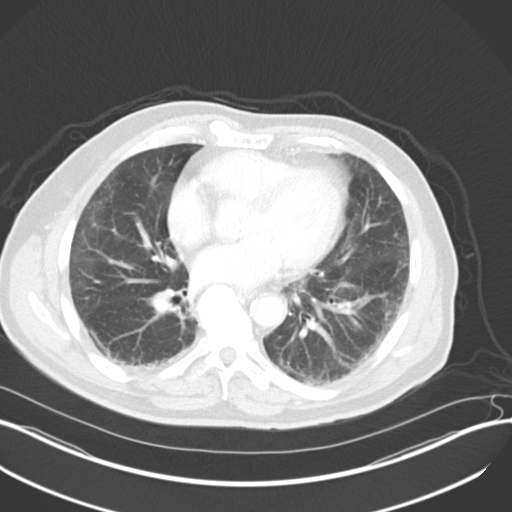
[im 113/139  lung]
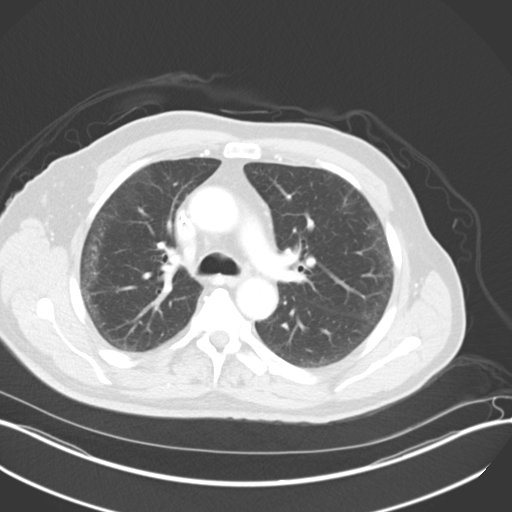
[im 126/139  mediastinal]
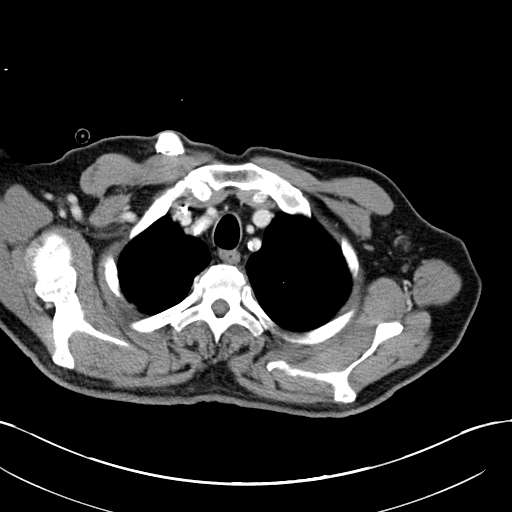
[im 126/139  lung]
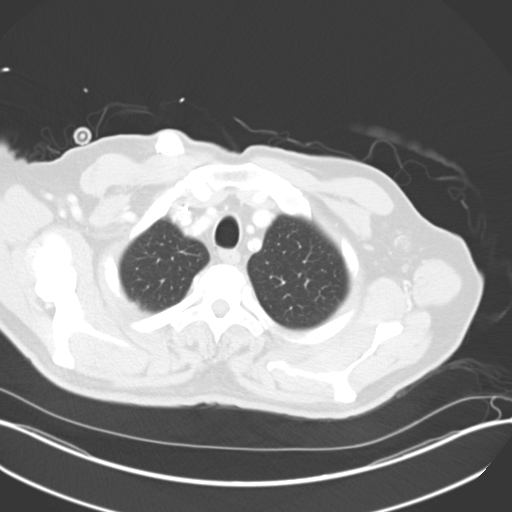

[Series 5: coronals · coronal · 0.77mm/px · 3 of 159 slices shown]
[im 32/159  lung]
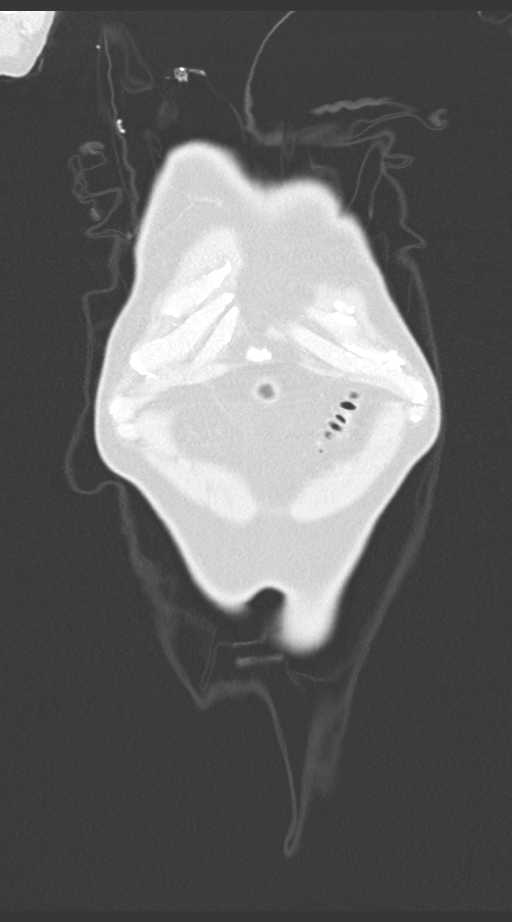
[im 64/159  lung]
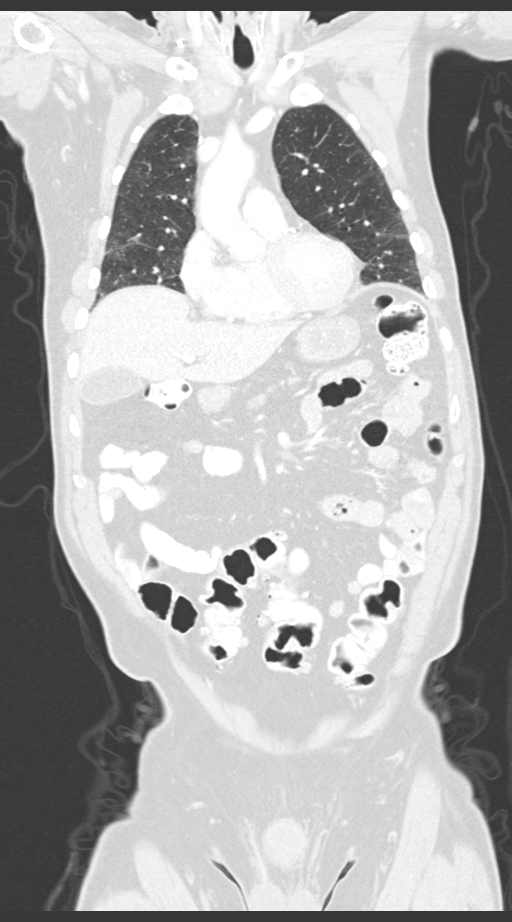
[im 95/159  lung]
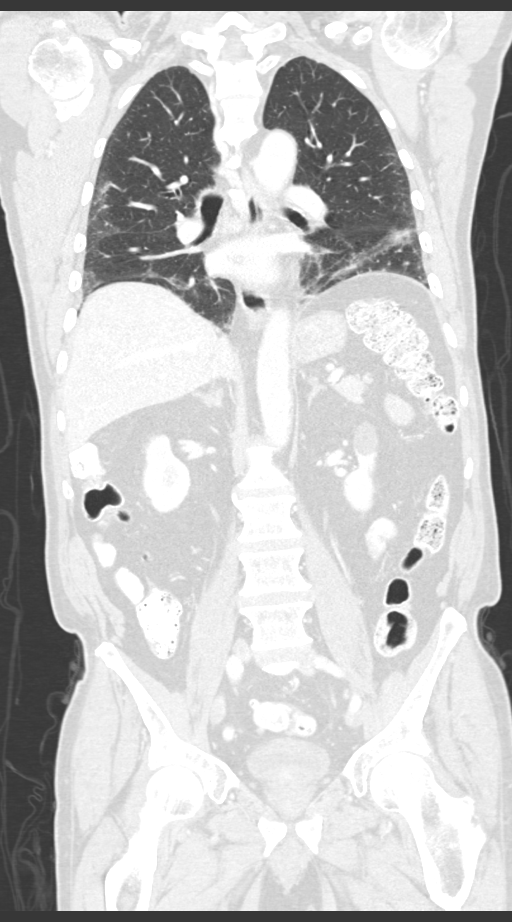

[12 of 36 positions shown; findings below may reference images not displayed]

Imaging Indication: Assess response to therapy

Interval therapy since last imaging? Yes

Initial Cancer Diagnosis

Date: [DATE]; Established by: Biopsy-proven

Detailed Pathology: Stage IV non-small cell lung cancer,
adenocarcinoma.

Primary Tumor location:  Left upper lobe.

Surgeries: No.

Chemotherapy: Yes; Ongoing? Yes; Most recent administration:
[DATE]

Immunotherapy?  Yes; Type: Keytruda; Ongoing? Yes

Radiation therapy? Yes; Date Range: [DATE]; Target: SRS to the
metastatic brain lesion

EXAM:
CT CHEST, ABDOMEN, AND PELVIS WITH CONTRAST
RADIATION DOSE REDUCTION: This exam was performed according to the
departmental dose-optimization program which includes automated
exposure control, adjustment of the mA and/or kV according to
patient size and/or use of iterative reconstruction technique.

CONTRAST:  100mL OMNIPAQUE IOHEXOL 300 MG/ML  SOLN
FINDINGS: CT CHEST FINDINGS

Cardiovascular: Right IJ Port-A-Cath terminates in the high right
atrium. Atherosclerotic calcification of the aorta, aortic valve and
coronary arteries. Heart size within normal limits. No pericardial
effusion.

Mediastinum/Nodes: No pathologically enlarged mediastinal, hilar or
axillary lymph nodes. Esophagus is unremarkable.

Lungs/Pleura: Basilar predominant subpleural ground-glass and mild
traction bronchiectasis/bronchiolectasis. Findings are similar to
[DATE]. Subpleural nodular density in the anterior left upper
lobe measures 5 mm (7/77), unchanged. Slightly more inferiorly is a
new 6 mm subpleural nodule in the anteromedial left upper lobe
(7/81). No pleural fluid. Airway is unremarkable.

Musculoskeletal: Degenerative changes in the spine. No worrisome
lytic or sclerotic lesions.

CT ABDOMEN PELVIS FINDINGS

Hepatobiliary: Liver and gallbladder are unremarkable. No biliary
ductal dilatation.

Pancreas: Negative.

Spleen: Negative.

Adrenals/Urinary Tract: Adrenal glands are unremarkable. Scarring in
the kidneys. Subcentimeter low-attenuation lesion in the interpolar
right kidney is too small to characterize. 1.9 cm fluid density
lesion in the left kidney is consistent with a cyst. Kidneys are
otherwise unremarkable. Ureters are decompressed. Bladder is low in
volume.

Stomach/Bowel: Tiny hiatal hernia. Stomach, small bowel, appendix
and colon are unremarkable.

Vascular/Lymphatic: Atherosclerotic calcification of the aorta. No
pathologically enlarged lymph nodes.

Reproductive: Prostate may be minimally prominent.

Other: No free fluid.  Mesenteries and peritoneum are unremarkable.

Musculoskeletal: Grade 2 anterolisthesis of L5 on S1 secondary to
bilateral L5 pars defects. Advanced secondary degenerative disc
disease. No worrisome lytic or sclerotic lesions.
IMPRESSION: 1. New 6 mm subpleural anteromedial left upper lobe nodule.
Short-term follow-up CT chest without contrast in 3 months is
recommended.
2. Basilar predominant subpleural ground-glass and traction
bronchiectasis/bronchiolectasis, similar to [DATE]. Findings can
be seen with interstitial lung disease such as nonspecific
interstitial pneumonitis. Usual interstitial pneumonitis is not
excluded.
3. Aortic atherosclerosis ([6I]-[6I]). Coronary artery
calcification.

## 2021-02-19 MED ORDER — IOHEXOL 300 MG/ML  SOLN
100.0000 mL | Freq: Once | INTRAMUSCULAR | Status: AC | PRN
  Administered 2021-02-19: 100 mL via INTRAVENOUS

## 2021-02-19 MED ORDER — HEPARIN SOD (PORK) LOCK FLUSH 100 UNIT/ML IV SOLN
INTRAVENOUS | Status: AC
  Administered 2021-02-19: 500 [IU] via INTRAVENOUS
  Filled 2021-02-19: qty 5

## 2021-02-19 MED ORDER — SODIUM CHLORIDE (PF) 0.9 % IJ SOLN
INTRAMUSCULAR | Status: AC
  Filled 2021-02-19: qty 50

## 2021-02-19 MED ORDER — HEPARIN SOD (PORK) LOCK FLUSH 100 UNIT/ML IV SOLN: 500.0000 [IU] | Freq: Once | INTRAVENOUS | Status: AC

## 2021-02-23 ENCOUNTER — Other Ambulatory Visit: Payer: Self-pay

## 2021-02-23 ENCOUNTER — Encounter: Payer: Self-pay | Admitting: Internal Medicine

## 2021-02-23 ENCOUNTER — Inpatient Hospital Stay: Payer: Medicare HMO

## 2021-02-23 ENCOUNTER — Other Ambulatory Visit: Payer: Medicare HMO

## 2021-02-23 ENCOUNTER — Inpatient Hospital Stay: Payer: Medicare HMO | Attending: Internal Medicine | Admitting: Internal Medicine

## 2021-02-23 VITALS — BP 128/71 | HR 78 | Temp 97.3°F | Resp 19 | Ht 73.0 in | Wt 185.5 lb

## 2021-02-23 DIAGNOSIS — C3492 Malignant neoplasm of unspecified part of left bronchus or lung: Secondary | ICD-10-CM | POA: Diagnosis not present

## 2021-02-23 DIAGNOSIS — C782 Secondary malignant neoplasm of pleura: Secondary | ICD-10-CM | POA: Insufficient documentation

## 2021-02-23 DIAGNOSIS — J910 Malignant pleural effusion: Secondary | ICD-10-CM | POA: Insufficient documentation

## 2021-02-23 DIAGNOSIS — Z5112 Encounter for antineoplastic immunotherapy: Secondary | ICD-10-CM | POA: Insufficient documentation

## 2021-02-23 DIAGNOSIS — C3412 Malignant neoplasm of upper lobe, left bronchus or lung: Secondary | ICD-10-CM | POA: Insufficient documentation

## 2021-02-23 DIAGNOSIS — Z95828 Presence of other vascular implants and grafts: Secondary | ICD-10-CM

## 2021-02-23 DIAGNOSIS — Z79899 Other long term (current) drug therapy: Secondary | ICD-10-CM | POA: Insufficient documentation

## 2021-02-23 DIAGNOSIS — C7931 Secondary malignant neoplasm of brain: Secondary | ICD-10-CM | POA: Insufficient documentation

## 2021-02-23 DIAGNOSIS — Z5111 Encounter for antineoplastic chemotherapy: Secondary | ICD-10-CM | POA: Diagnosis present

## 2021-02-23 LAB — CMP (CANCER CENTER ONLY)
ALT: 10 U/L (ref 0–44)
AST: 21 U/L (ref 15–41)
Albumin: 3.9 g/dL (ref 3.5–5.0)
Alkaline Phosphatase: 81 U/L (ref 38–126)
Anion gap: 5 (ref 5–15)
BUN: 11 mg/dL (ref 8–23)
CO2: 29 mmol/L (ref 22–32)
Calcium: 9.3 mg/dL (ref 8.9–10.3)
Chloride: 105 mmol/L (ref 98–111)
Creatinine: 1.02 mg/dL (ref 0.61–1.24)
GFR, Estimated: >60 mL/min (ref >60)
Glucose, Bld: 91 mg/dL (ref 70–99)
Potassium: 4.7 mmol/L (ref 3.5–5.1)
Sodium: 139 mmol/L (ref 135–145)
Total Bilirubin: 0.4 mg/dL (ref 0.3–1.2)
Total Protein: 6.7 g/dL (ref 6.5–8.1)

## 2021-02-23 LAB — CBC WITH DIFFERENTIAL (CANCER CENTER ONLY)
Abs Immature Granulocytes: 0.02 10*3/uL (ref 0.00–0.07)
Basophils Absolute: 0.1 10*3/uL (ref 0.0–0.1)
Basophils Relative: 1 %
Eosinophils Absolute: 0.5 10*3/uL (ref 0.0–0.5)
Eosinophils Relative: 8 %
HCT: 34 % — ABNORMAL LOW (ref 39.0–52.0)
Hemoglobin: 11.9 g/dL — ABNORMAL LOW (ref 13.0–17.0)
Immature Granulocytes: 0 %
Lymphocytes Relative: 25 %
Lymphs Abs: 1.6 10*3/uL (ref 0.7–4.0)
MCH: 33.6 pg (ref 26.0–34.0)
MCHC: 35 g/dL (ref 30.0–36.0)
MCV: 96 fL (ref 80.0–100.0)
Monocytes Absolute: 1 10*3/uL (ref 0.1–1.0)
Monocytes Relative: 16 %
Neutro Abs: 3.1 10*3/uL (ref 1.7–7.7)
Neutrophils Relative %: 50 %
Platelet Count: 210 10*3/uL (ref 150–400)
RBC: 3.54 MIL/uL — ABNORMAL LOW (ref 4.22–5.81)
RDW: 13.9 % (ref 11.5–15.5)
WBC Count: 6.3 10*3/uL (ref 4.0–10.5)
nRBC: 0 % (ref 0.0–0.2)

## 2021-02-23 LAB — TSH: TSH: 5.285 u[IU]/mL — ABNORMAL HIGH (ref 0.320–4.118)

## 2021-02-23 MED ORDER — SODIUM CHLORIDE 0.9% FLUSH
10.0000 mL | Freq: Once | INTRAVENOUS | Status: AC
  Administered 2021-02-23: 10 mL

## 2021-02-23 MED ORDER — PROCHLORPERAZINE MALEATE 10 MG PO TABS
10.0000 mg | ORAL_TABLET | Freq: Once | ORAL | Status: AC
  Administered 2021-02-23: 10 mg via ORAL
  Filled 2021-02-23: qty 1

## 2021-02-23 MED ORDER — SODIUM CHLORIDE 0.9 % IV SOLN: Freq: Once | INTRAVENOUS | Status: AC

## 2021-02-23 MED ORDER — SODIUM CHLORIDE 0.9 % IV SOLN
500.0000 mg/m2 | Freq: Once | INTRAVENOUS | Status: AC
  Administered 2021-02-23: 1000 mg via INTRAVENOUS
  Filled 2021-02-23: qty 40

## 2021-02-23 MED ORDER — HEPARIN SOD (PORK) LOCK FLUSH 100 UNIT/ML IV SOLN
500.0000 [IU] | Freq: Once | INTRAVENOUS | Status: AC | PRN
  Administered 2021-02-23: 500 [IU]

## 2021-02-23 MED ORDER — SODIUM CHLORIDE 0.9% FLUSH
10.0000 mL | INTRAVENOUS | Status: DC | PRN
  Administered 2021-02-23: 10 mL

## 2021-02-23 MED ORDER — SODIUM CHLORIDE 0.9 % IV SOLN
200.0000 mg | Freq: Once | INTRAVENOUS | Status: AC
  Administered 2021-02-23: 200 mg via INTRAVENOUS
  Filled 2021-02-23: qty 200

## 2021-02-23 NOTE — Progress Notes (Signed)
[facility] [phone]   [phone]  OFFICE PROGRESS NOTE  [name], MD [address] [place] [address]  DIAGNOSIS: Stage IV (T1b, N2, M1a) non-small cell lung cancer, adenocarcinoma diagnosed in July 2022 and presented with left upper lobe nodule in addition to AP window lymphadenopathy and left-sided malignant pleural effusion as well as pleural metastatic disease.  The patient also has solitary left temporal brain metastasis.  Biomarker Findings Microsatellite status - MS-Stable Tumor Mutational Burden - 4 Muts/Mb Genomic Findings For a complete list of the genes assayed, please refer to the Appendix. [id number] PTEN splice site [id number] - subclonal DOT1L S911L - subclonal RAD21 S271* RB1 loss exons 3-23 TP53 N321f*34 8 Disease relevant genes with no reportable alterations: ALK, BRAF, EGFR, ERBB2, KRAS, MET, RET, ROS1  PRIOR THERAPY: None  CURRENT THERAPY: Systemic chemotherapy with carboplatin for AUC of 5, Alimta 500 Mg/M2 and Keytruda 200 Mg IV every 3 weeks.  First dose August 13, 2020.  Status post 9 cycles.  Starting from cycle #5 he is on maintenance treatment with Alimta and Keytruda every 3 weeks.  INTERVAL HISTORY: Peter Calandra81y.o. male returns to the clinic today for follow-up visit accompanied by his wife.  The patient is feeling fine today with no concerning complaints except for mild fatigue and shortness of breath with exertion.  He denied having any current chest pain has mild cough with no hemoptysis.  He denied having any fever or chills.  He has no nausea, vomiting, diarrhea or constipation.  He has no headache or visual changes.  He denied having any weight loss or night sweats.  He continues to tolerate his maintenance treatment with Alimta and Keytruda fairly well.  He had repeat CT scan of the chest, abdomen pelvis performed recently and is here for evaluation and discussion of his scan  results.  MEDICAL HISTORY: Past Medical History:  Diagnosis Date   Lung cancer ([facility]    Lung cancer metastatic to brain ([facility]     ALLERGIES:  has no active allergies.  MEDICATIONS:  Current Outpatient Medications  Medication Sig Dispense Refill   aspirin 81 MG EC tablet Take by mouth.     Bacillus Coagulans-Inulin (PROBIOTIC) 1-250 BILLION-MG CAPS Take 1 tablet by mouth daily.     bisacodyl (DULCOLAX) 5 MG EC tablet Take 5 mg by mouth 3 (three) times daily as needed for moderate constipation.     coal tar-salicylic acid 2 % shampoo Apply topically daily as needed for itching.     DULoxetine (CYMBALTA) 30 MG capsule Take 30 mg by mouth daily.     fluocinonide cream (LIDEX) 06.57% Apply 1 application topically 2 (two) times daily.     folic acid (FOLVITE) 1 MG tablet TAKE 1 TABLET(1 MG) BY MOUTH DAILY 30 tablet 4   Glucosamine-Chondroitin 250-200 MG TABS Take 1 tablet by mouth daily.     hydrocortisone cream 1 % Apply 1 application topically 2 (two) times daily. 453.6 g 0   ketoconazole (NIZORAL) 2 % cream Apply 1 application topically daily.     Krill Oil (OMEGA-3) 500 MG CAPS Take by mouth.     lidocaine-prilocaine (EMLA) cream Apply 1 application topically as needed. 30 g 1   loratadine (CLARITIN) 10 MG tablet Take by mouth.     Magnesium 250 MG TABS Take by mouth.     omeprazole (PRILOSEC) 40 MG capsule Take 40 mg by mouth daily.  ondansetron (ZOFRAN-ODT) 4 MG disintegrating tablet Take 1 tablet (4 mg total) by mouth every 8 (eight) hours as needed. 20 tablet 0   prochlorperazine (COMPAZINE) 10 MG tablet TAKE 1 TABLET(10 MG) BY MOUTH EVERY 6 HOURS AS NEEDED FOR NAUSEA OR VOMITING 30 tablet 0   triamcinolone cream (KENALOG) 0.1 % Apply 1 application topically daily as needed. 453 g 6   No current facility-administered medications for this visit.    SURGICAL HISTORY:  Past Surgical History:  Procedure Laterality Date   IR IMAGING GUIDED PORT INSERTION  11/24/2020   KNEE  SURGERY     orthoscopic     TONSILLECTOMY AND ADENOIDECTOMY      REVIEW OF SYSTEMS:  Constitutional: positive for fatigue Eyes: negative Ears, nose, mouth, throat, and face: negative Respiratory: positive for dyspnea on exertion Cardiovascular: negative Gastrointestinal: negative Genitourinary:negative Integument/breast: negative Hematologic/lymphatic: negative Musculoskeletal:negative Neurological: negative Behavioral/Psych: negative Endocrine: negative Allergic/Immunologic: negative   PHYSICAL EXAMINATION: General appearance: alert, cooperative, fatigued, and no distress Head: Normocephalic, without obvious abnormality, atraumatic Neck: no adenopathy, no JVD, supple, symmetrical, trachea midline, and thyroid not enlarged, symmetric, no tenderness/mass/nodules Lymph nodes: Cervical, supraclavicular, and axillary nodes normal. Resp: clear to auscultation bilaterally Back: symmetric, no curvature. ROM normal. No CVA tenderness. Cardio: regular rate and rhythm, S1, S2 normal, no murmur, click, rub or gallop GI: soft, non-tender; bowel sounds normal; no masses,  no organomegaly Extremities: extremities normal, atraumatic, no cyanosis or edema Neurologic: Alert and oriented X 3, normal strength and tone. Normal symmetric reflexes. Normal coordination and gait  ECOG PERFORMANCE STATUS: 1 - Symptomatic but completely ambulatory  Blood pressure 128/71, pulse 78, temperature (!) 97.3 F (36.3 C), temperature source Tympanic, resp. rate 19, height '6\' 1"'  (1.854 m), weight 185 lb 8 oz (84.1 kg), SpO2 98 %.  LABORATORY DATA: Lab Results  Component Value Date   WBC 6.3 02/23/2021   HGB 11.9 (L) 02/23/2021   HCT 34.0 (L) 02/23/2021   MCV 96.0 02/23/2021   PLT 210 02/23/2021      Chemistry      Component Value Date/Time   NA 138 02/02/2021 1041   K 4.7 02/02/2021 1041   CL 104 02/02/2021 1041   CO2 28 02/02/2021 1041   BUN 12 02/02/2021 1041   CREATININE 1.05 02/02/2021 1041       Component Value Date/Time   CALCIUM 9.6 02/02/2021 1041   ALKPHOS 72 02/02/2021 1041   AST 22 02/02/2021 1041   ALT 13 02/02/2021 1041   BILITOT 0.3 02/02/2021 1041       RADIOGRAPHIC STUDIES: CT Chest W Contrast  Result Date: 02/20/2021 CLINICAL DATA:  Primary Cancer Type: Lung Imaging Indication: Assess response to therapy Interval therapy since last imaging? Yes Initial Cancer Diagnosis Date: 07/21/2020; Established by: Biopsy-proven Detailed Pathology: Stage IV non-small cell lung cancer, adenocarcinoma. Primary Tumor location:  Left upper lobe. Surgeries: No. Chemotherapy: Yes; Ongoing? Yes; Most recent administration: 02/02/2021 Immunotherapy?  Yes; Type: Keytruda; Ongoing? Yes Radiation therapy? Yes; Date Range: 09/03/2020; Target: SRS to the metastatic brain lesion EXAM: CT CHEST, ABDOMEN, AND PELVIS WITH CONTRAST TECHNIQUE: Multidetector CT imaging of the chest, abdomen and pelvis was performed following the standard protocol during bolus administration of intravenous contrast. RADIATION DOSE REDUCTION: This exam was performed according to the departmental dose-optimization program which includes automated exposure control, adjustment of the mA and/or kV according to patient size and/or use of iterative reconstruction technique. CONTRAST:  159m OMNIPAQUE IOHEXOL 300 MG/ML  SOLN COMPARISON:  CT abdomen pelvis  02/19/2021, CT chest, abdomen and pelvis 12/18/2020. PET-CT 08/01/2020. FINDINGS: CT CHEST FINDINGS Cardiovascular: Right IJ Port-A-Cath terminates in the high right atrium. Atherosclerotic calcification of the aorta, aortic valve and coronary arteries. Heart size within normal limits. No pericardial effusion. Mediastinum/Nodes: No pathologically enlarged mediastinal, hilar or axillary lymph nodes. Esophagus is unremarkable. Lungs/Pleura: Basilar predominant subpleural ground-glass and mild traction bronchiectasis/bronchiolectasis. Findings are similar to 12/18/2020. Subpleural nodular  density in the anterior left upper lobe measures 5 mm (7/77), unchanged. Slightly more inferiorly is a new 6 mm subpleural nodule in the anteromedial left upper lobe (7/81). No pleural fluid. Airway is unremarkable. Musculoskeletal: Degenerative changes in the spine. No worrisome lytic or sclerotic lesions. CT ABDOMEN PELVIS FINDINGS Hepatobiliary: Liver and gallbladder are unremarkable. No biliary ductal dilatation. Pancreas: Negative. Spleen: Negative. Adrenals/Urinary Tract: Adrenal glands are unremarkable. Scarring in the kidneys. Subcentimeter low-attenuation lesion in the interpolar right kidney is too small to characterize. 1.9 cm fluid density lesion in the left kidney is consistent with a cyst. Kidneys are otherwise unremarkable. Ureters are decompressed. Bladder is low in volume. Stomach/Bowel: Tiny hiatal hernia. Stomach, small bowel, appendix and colon are unremarkable. Vascular/Lymphatic: Atherosclerotic calcification of the aorta. No pathologically enlarged lymph nodes. Reproductive: Prostate may be minimally prominent. Other: No free fluid.  Mesenteries and peritoneum are unremarkable. Musculoskeletal: Grade 2 anterolisthesis of L5 on S1 secondary to bilateral L5 pars defects. Advanced secondary degenerative disc disease. No worrisome lytic or sclerotic lesions. IMPRESSION: 1. New 6 mm subpleural anteromedial left upper lobe nodule. Short-term follow-up CT chest without contrast in 3 months is recommended. 2. Basilar predominant subpleural ground-glass and traction bronchiectasis/bronchiolectasis, similar to 12/18/2020. Findings can be seen with interstitial lung disease such as nonspecific interstitial pneumonitis. Usual interstitial pneumonitis is not excluded. 3. Aortic atherosclerosis (ICD10-I70.0). Coronary artery calcification. Electronically Signed   By: Lorin Picket M.D.   On: 02/20/2021 10:31   CT Abdomen Pelvis W Contrast  Result Date: 02/20/2021 CLINICAL DATA:  Primary Cancer Type:  Lung Imaging Indication: Assess response to therapy Interval therapy since last imaging? Yes Initial Cancer Diagnosis Date: 07/21/2020; Established by: Biopsy-proven Detailed Pathology: Stage IV non-small cell lung cancer, adenocarcinoma. Primary Tumor location:  Left upper lobe. Surgeries: No. Chemotherapy: Yes; Ongoing? Yes; Most recent administration: 02/02/2021 Immunotherapy?  Yes; Type: Keytruda; Ongoing? Yes Radiation therapy? Yes; Date Range: 09/03/2020; Target: SRS to the metastatic brain lesion EXAM: CT CHEST, ABDOMEN, AND PELVIS WITH CONTRAST TECHNIQUE: Multidetector CT imaging of the chest, abdomen and pelvis was performed following the standard protocol during bolus administration of intravenous contrast. RADIATION DOSE REDUCTION: This exam was performed according to the departmental dose-optimization program which includes automated exposure control, adjustment of the mA and/or kV according to patient size and/or use of iterative reconstruction technique. CONTRAST:  114m OMNIPAQUE IOHEXOL 300 MG/ML  SOLN COMPARISON:  CT abdomen pelvis 02/19/2021, CT chest, abdomen and pelvis 12/18/2020. PET-CT 08/01/2020. FINDINGS: CT CHEST FINDINGS Cardiovascular: Right IJ Port-A-Cath terminates in the high right atrium. Atherosclerotic calcification of the aorta, aortic valve and coronary arteries. Heart size within normal limits. No pericardial effusion. Mediastinum/Nodes: No pathologically enlarged mediastinal, hilar or axillary lymph nodes. Esophagus is unremarkable. Lungs/Pleura: Basilar predominant subpleural ground-glass and mild traction bronchiectasis/bronchiolectasis. Findings are similar to 12/18/2020. Subpleural nodular density in the anterior left upper lobe measures 5 mm (7/77), unchanged. Slightly more inferiorly is a new 6 mm subpleural nodule in the anteromedial left upper lobe (7/81). No pleural fluid. Airway is unremarkable. Musculoskeletal: Degenerative changes in the spine. No worrisome lytic or  sclerotic lesions. CT ABDOMEN PELVIS FINDINGS Hepatobiliary: Liver and gallbladder are unremarkable. No biliary ductal dilatation. Pancreas: Negative. Spleen: Negative. Adrenals/Urinary Tract: Adrenal glands are unremarkable. Scarring in the kidneys. Subcentimeter low-attenuation lesion in the interpolar right kidney is too small to characterize. 1.9 cm fluid density lesion in the left kidney is consistent with a cyst. Kidneys are otherwise unremarkable. Ureters are decompressed. Bladder is low in volume. Stomach/Bowel: Tiny hiatal hernia. Stomach, small bowel, appendix and colon are unremarkable. Vascular/Lymphatic: Atherosclerotic calcification of the aorta. No pathologically enlarged lymph nodes. Reproductive: Prostate may be minimally prominent. Other: No free fluid.  Mesenteries and peritoneum are unremarkable. Musculoskeletal: Grade 2 anterolisthesis of L5 on S1 secondary to bilateral L5 pars defects. Advanced secondary degenerative disc disease. No worrisome lytic or sclerotic lesions. IMPRESSION: 1. New 6 mm subpleural anteromedial left upper lobe nodule. Short-term follow-up CT chest without contrast in 3 months is recommended. 2. Basilar predominant subpleural ground-glass and traction bronchiectasis/bronchiolectasis, similar to 12/18/2020. Findings can be seen with interstitial lung disease such as nonspecific interstitial pneumonitis. Usual interstitial pneumonitis is not excluded. 3. Aortic atherosclerosis (ICD10-I70.0). Coronary artery calcification. Electronically Signed   By: Lorin Picket M.D.   On: 02/20/2021 10:31    ASSESSMENT AND PLAN: This is a very pleasant 81 years old white male recently diagnosed with a stage IV (T1b, N2, M1a) non-small cell lung cancer, adenocarcinoma presented with left upper lobe lung nodule in addition to mediastinal lymphadenopathy and pleural-based metastasis as well as malignant left pleural effusion diagnosed in July 2022. His molecular studies by foundation 1  showed no actionable mutations. The patient is currently undergoing systemic chemotherapy with carboplatin for AUC of 5, Alimta 500 Mg/M2 and Keytruda 200 Mg IV every 3 weeks.  He is status post 9 cycles.  Starting from cycle #5 the patient will be on treatment with Alimta and Keytruda every 3 weeks. He has been tolerating his treatment well with no concerning adverse effects except for mild fatigue. The patient had repeat CT scan of the chest, abdomen pelvis performed recently.  I personally and independently reviewed the scan images and discussed the results with the patient and his wife. His scan showed no concerning findings for disease progression but there was new 0.6 cm subpleural anterior medial left upper lobe nodule that need close monitoring on the upcoming imaging studies. I recommended for the patient to continue his current treatment with maintenance Alimta and Keytruda and he will proceed with cycle #10 today as planned. I will see him back for follow-up visit in 3 weeks for evaluation before the next cycle of his treatment. The patient was advised to call immediately if he has any other concerning symptoms in the interval. The patient voices understanding of current disease status and treatment options and is in agreement with the current care plan.  All questions were answered. The patient knows to call the clinic with any problems, questions or concerns. We can certainly see the patient much sooner if necessary.  Disclaimer: This note was dictated with voice recognition software. Similar sounding words can inadvertently be transcribed and may not be corrected upon review.

## 2021-02-23 NOTE — Patient Instructions (Signed)
[facility] MEDICAL [facility]COLOG[facility]  Discharge Instructions: Thank you for choosing [facility] to provide your oncology and hematology care.   If you have a lab appointment with the Cancer Center, please go directly to the Cancer Center and check in at the registration area.   Wear comfortable clothing and clothing appropriate for easy access to any Portacath or PICC line.   We strive to give you quality time with your provider. You may need to reschedule your appointment if you arrive late (15 or more minutes).  Arriving late affects you and other patients whose appointments are after yours.  Also, if you miss three or more appointments without notifying the office, you may be dismissed from the clinic at the provider's discretion.      For prescription refill requests, have your pharmacy contact our office and allow 72 hours for refills to be completed.    Today you received the following chemotherapy and/or immunotherapy agents: Keytruda/Alimta.      To help prevent nausea and vomiting after your treatment, we encourage you to take your nausea medication as directed.  BELOW ARE SYMPTOMS THAT SHOULD BE REPORTED IMMEDIATELY: *FEVER GREATER THAN 100.4 F (38 C) OR HIGHER *CHILLS OR SWEATING *NAUSEA AND VOMITING THAT IS NOT CONTROLLED WITH YOUR NAUSEA MEDICATION *UNUSUAL SHORTNESS OF BREATH *UNUSUAL BRUISING OR BLEEDING *URINARY PROBLEMS (pain or burning when urinating, or frequent urination) *BOWEL PROBLEMS (unusual diarrhea, constipation, pain near the anus) TENDERNESS IN MOUTH AND THROAT WITH OR WITHOUT PRESENCE OF ULCERS (sore throat, sores in mouth, or a toothache) UNUSUAL RASH, SWELLING OR PAIN  UNUSUAL VAGINAL DISCHARGE OR ITCHING   Items with * indicate a potential emergency and should be followed up as soon as possible or go to the Emergency Department if any problems should occur.  Please show the CHEMOTHERAPY ALERT CARD or IMMUNOTHERAPY ALERT CARD at  check-in to the Emergency Department and triage nurse.  Should you have questions after your visit or need to cancel or reschedule your appointment, please contact [facility]  Dept: [phone]  and follow the prompts.  Office hours are 8:00 a.m. to 4:30 p.m. Monday - Friday. Please note that voicemails left after 4:00 p.m. may not be returned until the following business day.  We are closed weekends and major holidays. You have access to a nurse at all times for urgent questions. Please call the main number to the clinic Dept: [phone] and follow the prompts.   For any non-urgent questions, you may also contact your provider using MyChart. We now offer e-Visits for anyone 18 and older to request care online for non-urgent symptoms. For details visit [email or web address].   Also download the MyChart app! Go to the app store, search "MyChart", open the app, select [facility], and log in with your MyChart username and password.  Due to Covid, a mask is required upon entering the hospital/clinic. If you do not have a mask, one will be given to you upon arrival. For doctor visits, patients may have 1 support person aged 18 or older with them. For treatment visits, patients cannot have anyone with them due to current Covid guidelines and our immunocompromised population.   

## 2021-03-10 NOTE — Progress Notes (Unsigned)
[facility] OFFICE PROGRESS NOTE  [name], MD [address] [place] [id number]  DIAGNOSIS: Stage IV (T1b, N2, M1 a) non-small cell lung cancer, adenocarcinoma presented with left upper lobe lung nodule in addition to mediastinal lymphadenopathy and pleural-based metastasis as well as malignant left pleural effusion diagnosed in July 2022.   Molecular Studies: Biomarker Findings Microsatellite status - MS-Stable Tumor Mutational Burden - 4 Muts/Mb Genomic Findings For a complete list of the genes assayed, please refer to the Appendix. JGO1LX B262M PTEN splice site [id number]>C - subclonal DOT1L S911L - subclonal RAD21 S271* RB1 loss exons 3-23 TP53 N314f*34 8 Disease relevant genes with no reportable alterations: ALK, BRAF, EGFR, ERBB2, KRAS, MET, RET, ROS1  PRIOR THERAPY: SRS to the metastatic brain lesion under the care of Dr. MLisbeth Renshaw Completed on 09/03/20.  CURRENT THERAPY: Systemic chemotherapy with carboplatin for AUC of 5, Alimta 500 Mg/M2 and Keytruda 200 Mg IV every 3 weeks.  First dose on 08/13/20.  Status post 10 cycles.  Starting from cycle #5, the patient started maintenance treatment.  INTERVAL HISTORY: JIhor Meinzer89y.o. male returns to the clinic today for follow-up visit accompanied by his wife.  The patient is feeling fairly well today without any concerning complaints.  He is currently undergoing maintenance treatment with Alimta and Keytruda.  He is tolerating this fairly well except for ***.  Today, he denies any fever, chills, or night sweats.  His weight is ***.  He denies any chest pain or hemoptysis.  He has baseline dyspnea on exertion.  He reports that he has a cough occasionally but nothing out of the ordinary for him.  He denies any vomiting.  He has mild nausea for which his antiemetic is effective for him.  He denies any diarrhea.  He has mild constipation for which he uses Dulcolax which is helpful for him.  Denies any headache or  visual changes.  He has mild itching without rash.  He is here today for evaluation and repeat blood work before starting cycle #11.     MEDICAL HISTORY: Past Medical History:  Diagnosis Date   Lung cancer ([facility]    Lung cancer metastatic to brain ([facility]     ALLERGIES:  has no active allergies.  MEDICATIONS:  Current Outpatient Medications  Medication Sig Dispense Refill   aspirin 81 MG EC tablet Take by mouth.     Bacillus Coagulans-Inulin (PROBIOTIC) 1-250 BILLION-MG CAPS Take 1 tablet by mouth daily.     bisacodyl (DULCOLAX) 5 MG EC tablet Take 5 mg by mouth 3 (three) times daily as needed for moderate constipation.     coal tar-salicylic acid 2 % shampoo Apply topically daily as needed for itching.     DULoxetine (CYMBALTA) 30 MG capsule Take 30 mg by mouth daily.     fluocinonide cream (LIDEX) 01.63% Apply 1 application topically 2 (two) times daily.     folic acid (FOLVITE) 1 MG tablet TAKE 1 TABLET(1 MG) BY MOUTH DAILY 30 tablet 4   Glucosamine-Chondroitin 250-200 MG TABS Take 1 tablet by mouth daily.     hydrocortisone cream 1 % Apply 1 application topically 2 (two) times daily. 453.6 g 0   ketoconazole (NIZORAL) 2 % cream Apply 1 application topically daily.     Krill Oil (OMEGA-3) 500 MG CAPS Take by mouth.     lidocaine-prilocaine (EMLA) cream Apply 1 application topically as needed. 30 g 1   loratadine (CLARITIN) 10 MG tablet Take by mouth.  Magnesium 250 MG TABS Take by mouth.     omeprazole (PRILOSEC) 40 MG capsule Take 40 mg by mouth daily.     ondansetron (ZOFRAN-ODT) 4 MG disintegrating tablet Take 1 tablet (4 mg total) by mouth every 8 (eight) hours as needed. (Patient not taking: Reported on 02/23/2021) 20 tablet 0   prochlorperazine (COMPAZINE) 10 MG tablet TAKE 1 TABLET(10 MG) BY MOUTH EVERY 6 HOURS AS NEEDED FOR NAUSEA OR VOMITING 30 tablet 0   triamcinolone cream (KENALOG) 0.1 % Apply 1 application topically daily as needed. 453 g 6   No current  facility-administered medications for this visit.    SURGICAL HISTORY:  Past Surgical History:  Procedure Laterality Date   IR IMAGING GUIDED PORT INSERTION  11/24/2020   KNEE SURGERY     orthoscopic     TONSILLECTOMY AND ADENOIDECTOMY      REVIEW OF SYSTEMS:   Review of Systems  Constitutional: Negative for appetite change, chills, fatigue, fever and unexpected weight change.  HENT:   Negative for mouth sores, nosebleeds, sore throat and trouble swallowing.   Eyes: Negative for eye problems and icterus.  Respiratory: Negative for cough, hemoptysis, shortness of breath and wheezing.   Cardiovascular: Negative for chest pain and leg swelling.  Gastrointestinal: Negative for abdominal pain, constipation, diarrhea, nausea and vomiting.  Genitourinary: Negative for bladder incontinence, difficulty urinating, dysuria, frequency and hematuria.   Musculoskeletal: Negative for back pain, gait problem, neck pain and neck stiffness.  Skin: Negative for itching and rash.  Neurological: Negative for dizziness, extremity weakness, gait problem, headaches, light-headedness and seizures.  Hematological: Negative for adenopathy. Does not bruise/bleed easily.  Psychiatric/Behavioral: Negative for confusion, depression and sleep disturbance. The patient is not nervous/anxious.     PHYSICAL EXAMINATION:  There were no vitals taken for this visit.  ECOG PERFORMANCE STATUS: {CHL ONC ECOG [id number]  Physical Exam  Constitutional: Oriented to person, place, and time and well-developed, well-nourished, and in no distress. No distress.  HENT:  Head: Normocephalic and atraumatic.  Mouth/Throat: Oropharynx is clear and moist. No oropharyngeal exudate.  Eyes: Conjunctivae are normal. Right eye exhibits no discharge. Left eye exhibits no discharge. No scleral icterus.  Neck: Normal range of motion. Neck supple.  Cardiovascular: Normal rate, regular rhythm, normal heart sounds and intact distal  pulses.   Pulmonary/Chest: Effort normal and breath sounds normal. No respiratory distress. No wheezes. No rales.  Abdominal: Soft. Bowel sounds are normal. Exhibits no distension and no mass. There is no tenderness.  Musculoskeletal: Normal range of motion. Exhibits no edema.  Lymphadenopathy:    No cervical adenopathy.  Neurological: Alert and oriented to person, place, and time. Exhibits normal muscle tone. Gait normal. Coordination normal.  Skin: Skin is warm and dry. No rash noted. Not diaphoretic. No erythema. No pallor.  Psychiatric: Mood, memory and judgment normal.  Vitals reviewed.  LABORATORY DATA: Lab Results  Component Value Date   WBC 6.3 02/23/2021   HGB 11.9 (L) 02/23/2021   HCT 34.0 (L) 02/23/2021   MCV 96.0 02/23/2021   PLT 210 02/23/2021      Chemistry      Component Value Date/Time   NA 139 02/23/2021 1009   K 4.7 02/23/2021 1009   CL 105 02/23/2021 1009   CO2 29 02/23/2021 1009   BUN 11 02/23/2021 1009   CREATININE 1.02 02/23/2021 1009      Component Value Date/Time   CALCIUM 9.3 02/23/2021 1009   ALKPHOS 81 02/23/2021 1009   AST 21 02/23/2021  1009   ALT 10 02/23/2021 1009   BILITOT 0.4 02/23/2021 1009       RADIOGRAPHIC STUDIES:  CT Chest W Contrast  Result Date: 02/20/2021 CLINICAL DATA:  Primary Cancer Type: Lung Imaging Indication: Assess response to therapy Interval therapy since last imaging? Yes Initial Cancer Diagnosis Date: 07/21/2020; Established by: Biopsy-proven Detailed Pathology: Stage IV non-small cell lung cancer, adenocarcinoma. Primary Tumor location:  Left upper lobe. Surgeries: No. Chemotherapy: Yes; Ongoing? Yes; Most recent administration: 02/02/2021 Immunotherapy?  Yes; Type: Keytruda; Ongoing? Yes Radiation therapy? Yes; Date Range: 09/03/2020; Target: SRS to the metastatic brain lesion EXAM: CT CHEST, ABDOMEN, AND PELVIS WITH CONTRAST TECHNIQUE: Multidetector CT imaging of the chest, abdomen and pelvis was performed following  the standard protocol during bolus administration of intravenous contrast. RADIATION DOSE REDUCTION: This exam was performed according to the departmental dose-optimization program which includes automated exposure control, adjustment of the mA and/or kV according to patient size and/or use of iterative reconstruction technique. CONTRAST:  154m OMNIPAQUE IOHEXOL 300 MG/ML  SOLN COMPARISON:  CT abdomen pelvis 02/19/2021, CT chest, abdomen and pelvis 12/18/2020. PET-CT 08/01/2020. FINDINGS: CT CHEST FINDINGS Cardiovascular: Right IJ Port-A-Cath terminates in the high right atrium. Atherosclerotic calcification of the aorta, aortic valve and coronary arteries. Heart size within normal limits. No pericardial effusion. Mediastinum/Nodes: No pathologically enlarged mediastinal, hilar or axillary lymph nodes. Esophagus is unremarkable. Lungs/Pleura: Basilar predominant subpleural ground-glass and mild traction bronchiectasis/bronchiolectasis. Findings are similar to 12/18/2020. Subpleural nodular density in the anterior left upper lobe measures 5 mm (7/77), unchanged. Slightly more inferiorly is a new 6 mm subpleural nodule in the anteromedial left upper lobe (7/81). No pleural fluid. Airway is unremarkable. Musculoskeletal: Degenerative changes in the spine. No worrisome lytic or sclerotic lesions. CT ABDOMEN PELVIS FINDINGS Hepatobiliary: Liver and gallbladder are unremarkable. No biliary ductal dilatation. Pancreas: Negative. Spleen: Negative. Adrenals/Urinary Tract: Adrenal glands are unremarkable. Scarring in the kidneys. Subcentimeter low-attenuation lesion in the interpolar right kidney is too small to characterize. 1.9 cm fluid density lesion in the left kidney is consistent with a cyst. Kidneys are otherwise unremarkable. Ureters are decompressed. Bladder is low in volume. Stomach/Bowel: Tiny hiatal hernia. Stomach, small bowel, appendix and colon are unremarkable. Vascular/Lymphatic: Atherosclerotic  calcification of the aorta. No pathologically enlarged lymph nodes. Reproductive: Prostate may be minimally prominent. Other: No free fluid.  Mesenteries and peritoneum are unremarkable. Musculoskeletal: Grade 2 anterolisthesis of L5 on S1 secondary to bilateral L5 pars defects. Advanced secondary degenerative disc disease. No worrisome lytic or sclerotic lesions. IMPRESSION: 1. New 6 mm subpleural anteromedial left upper lobe nodule. Short-term follow-up CT chest without contrast in 3 months is recommended. 2. Basilar predominant subpleural ground-glass and traction bronchiectasis/bronchiolectasis, similar to 12/18/2020. Findings can be seen with interstitial lung disease such as nonspecific interstitial pneumonitis. Usual interstitial pneumonitis is not excluded. 3. Aortic atherosclerosis (ICD10-I70.0). Coronary artery calcification. Electronically Signed   By: MLorin PicketM.D.   On: 02/20/2021 10:31   CT Abdomen Pelvis W Contrast  Result Date: 02/20/2021 CLINICAL DATA:  Primary Cancer Type: Lung Imaging Indication: Assess response to therapy Interval therapy since last imaging? Yes Initial Cancer Diagnosis Date: 07/21/2020; Established by: Biopsy-proven Detailed Pathology: Stage IV non-small cell lung cancer, adenocarcinoma. Primary Tumor location:  Left upper lobe. Surgeries: No. Chemotherapy: Yes; Ongoing? Yes; Most recent administration: 02/02/2021 Immunotherapy?  Yes; Type: Keytruda; Ongoing? Yes Radiation therapy? Yes; Date Range: 09/03/2020; Target: SRS to the metastatic brain lesion EXAM: CT CHEST, ABDOMEN, AND PELVIS WITH CONTRAST TECHNIQUE: Multidetector CT imaging  of the chest, abdomen and pelvis was performed following the standard protocol during bolus administration of intravenous contrast. RADIATION DOSE REDUCTION: This exam was performed according to the departmental dose-optimization program which includes automated exposure control, adjustment of the mA and/or kV according to patient size  and/or use of iterative reconstruction technique. CONTRAST:  165m OMNIPAQUE IOHEXOL 300 MG/ML  SOLN COMPARISON:  CT abdomen pelvis 02/19/2021, CT chest, abdomen and pelvis 12/18/2020. PET-CT 08/01/2020. FINDINGS: CT CHEST FINDINGS Cardiovascular: Right IJ Port-A-Cath terminates in the high right atrium. Atherosclerotic calcification of the aorta, aortic valve and coronary arteries. Heart size within normal limits. No pericardial effusion. Mediastinum/Nodes: No pathologically enlarged mediastinal, hilar or axillary lymph nodes. Esophagus is unremarkable. Lungs/Pleura: Basilar predominant subpleural ground-glass and mild traction bronchiectasis/bronchiolectasis. Findings are similar to 12/18/2020. Subpleural nodular density in the anterior left upper lobe measures 5 mm (7/77), unchanged. Slightly more inferiorly is a new 6 mm subpleural nodule in the anteromedial left upper lobe (7/81). No pleural fluid. Airway is unremarkable. Musculoskeletal: Degenerative changes in the spine. No worrisome lytic or sclerotic lesions. CT ABDOMEN PELVIS FINDINGS Hepatobiliary: Liver and gallbladder are unremarkable. No biliary ductal dilatation. Pancreas: Negative. Spleen: Negative. Adrenals/Urinary Tract: Adrenal glands are unremarkable. Scarring in the kidneys. Subcentimeter low-attenuation lesion in the interpolar right kidney is too small to characterize. 1.9 cm fluid density lesion in the left kidney is consistent with a cyst. Kidneys are otherwise unremarkable. Ureters are decompressed. Bladder is low in volume. Stomach/Bowel: Tiny hiatal hernia. Stomach, small bowel, appendix and colon are unremarkable. Vascular/Lymphatic: Atherosclerotic calcification of the aorta. No pathologically enlarged lymph nodes. Reproductive: Prostate may be minimally prominent. Other: No free fluid.  Mesenteries and peritoneum are unremarkable. Musculoskeletal: Grade 2 anterolisthesis of L5 on S1 secondary to bilateral L5 pars defects. Advanced  secondary degenerative disc disease. No worrisome lytic or sclerotic lesions. IMPRESSION: 1. New 6 mm subpleural anteromedial left upper lobe nodule. Short-term follow-up CT chest without contrast in 3 months is recommended. 2. Basilar predominant subpleural ground-glass and traction bronchiectasis/bronchiolectasis, similar to 12/18/2020. Findings can be seen with interstitial lung disease such as nonspecific interstitial pneumonitis. Usual interstitial pneumonitis is not excluded. 3. Aortic atherosclerosis (ICD10-I70.0). Coronary artery calcification. Electronically Signed   By: MLorin PicketM.D.   On: 02/20/2021 10:31     ASSESSMENT/PLAN:  This is a very pleasant 81year old Caucasian male diagnosed with stage IV (T1b, N2, M1A) non-small cell lung cancer, adenocarcinoma.  The patient presented with a left upper lobe lung nodule in addition to mediastinal lymphadenopathy and pleural-based metastasis as well as a malignant left pleural effusion.  The patient was diagnosed in July 2022.  The patient also had a metastatic brain lesion.   The patient completed SRS to the brain lesion on 09/03/2020 under the care of Dr. MLisbeth Renshaw  The patient is currently undergoing systemic chemotherapy with carboplatin for an AUC of 5, Alimta 500 mg per metered squared, Keytruda 200 mg IV every 3 weeks.  The patient is status post 10 cycles.  Starting from cycle #5, the patient started maintenance Alimta and Keytruda   The patient was seen with Dr. MJulien Nordmanntoday.  Labs were reviewed.  Rec that he***with cycle #11 today as scheduled.  We will see him back for follow-up visit in 3 weeks for evaluation before undergoing cycle #12.  The patient was advised to call immediately if he has any concerning symptoms in the interval. The patient voices understanding of current disease status and treatment options and is in agreement with the  current care plan. All questions were answered. The patient knows to call the clinic with  any problems, questions or concerns. We can certainly see the patient much sooner if necessary        No orders of the defined types were placed in this encounter.    I spent {CHL ONC TIME VISIT - TVTWY:[id number]} counseling the patient face to face. The total time spent in the appointment was {CHL ONC TIME VISIT - MVAEP:[id number]}.  Jyrah Blye L Jase Himmelberger, PA-C 03/10/21

## 2021-03-11 ENCOUNTER — Telehealth: Payer: Self-pay | Admitting: Physician Assistant

## 2021-03-11 NOTE — Telephone Encounter (Signed)
Rescheduled 03/13 appointment to 03/14, patient has been called and notified. ?

## 2021-03-12 ENCOUNTER — Other Ambulatory Visit: Payer: Self-pay

## 2021-03-12 ENCOUNTER — Ambulatory Visit
Admission: RE | Admit: 2021-03-12 | Discharge: 2021-03-12 | Disposition: A | Discharge status: Home / Self Care | Payer: Medicare HMO | Source: Ambulatory Visit | Attending: Radiation Oncology | Admitting: Radiation Oncology

## 2021-03-12 DIAGNOSIS — C3492 Malignant neoplasm of unspecified part of left bronchus or lung: Secondary | ICD-10-CM

## 2021-03-12 DIAGNOSIS — Z95828 Presence of other vascular implants and grafts: Secondary | ICD-10-CM

## 2021-03-12 DIAGNOSIS — C7931 Secondary malignant neoplasm of brain: Secondary | ICD-10-CM

## 2021-03-12 IMAGING — MR MR HEAD WO/W CM
13 series · 48 of 48 positions shown · IV contrast (MULTIHANCE)
Comparison: [DATE] and earlier.

CLINICAL DATA: 80-year-old male with metastatic lung cancer.
Solitary treated left temporal lobe metastasis. Restaging.

EXAM:
MRI HEAD WITHOUT AND WITH CONTRAST
TECHNIQUE: Multiplanar, multiecho pulse sequences of the brain and surrounding
structures were obtained without and with intravenous contrast.
CONTRAST:  17mL MULTIHANCE GADOBENATE DIMEGLUMINE 529 MG/ML IV SOLN

[Series 2: FLAIR · sagittal · 3.0mm · 0.75mm/px · 1 of 39 slices shown (1 of 2)]
[im 1/39]
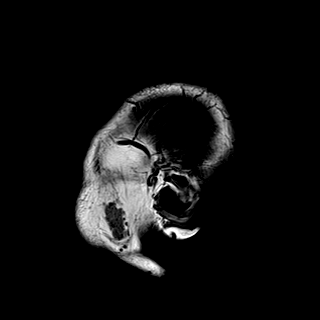

[Series 3: DWI · axial · 3.0mm · 1.50mm/px · z∈[-45,+103]mm · 3 of 80 slices shown (1 of 2)]
[im 1/80]
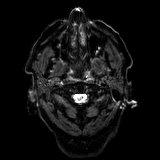
[im 40/80]
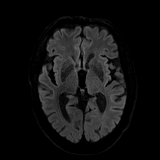
[im 80/80]
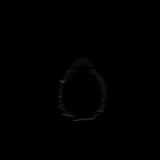

[Series 4: DWI · axial · 3.0mm · 1.50mm/px · z∈[-45,+103]mm · 2 of 40 slices shown (2 of 2)]
[im 1/40]
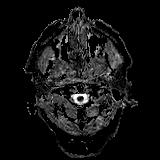
[im 40/40]
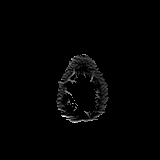

[Series 5: T2 · axial · 5.0mm · 0.57mm/px · 1 of 27 slices shown (1 of 2)]
[im 1/27]
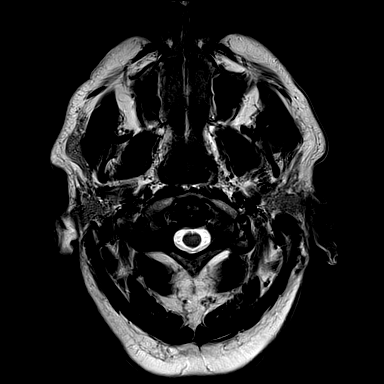

[Series 6: swi_images · axial · 1.5mm · 0.90mm/px · z∈[-47,+103]mm · 5 of 104 slices shown]
[im 1/104]
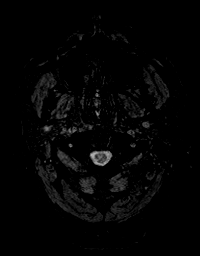
[im 26/104]
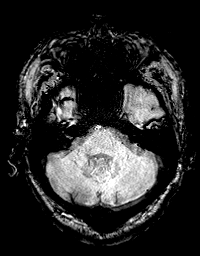
[im 52/104]
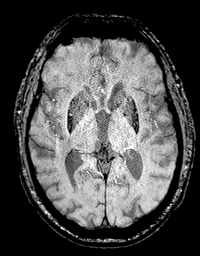
[im 78/104]
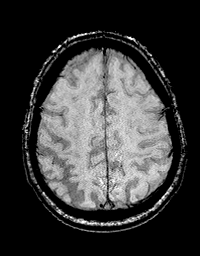
[im 104/104]
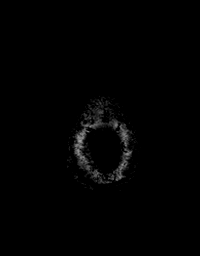

[Series 7: mip_images(sw) · axial · 12.0mm · 0.90mm/px · z∈[-42,+98]mm · 4 of 97 slices shown]
[im 1/97]
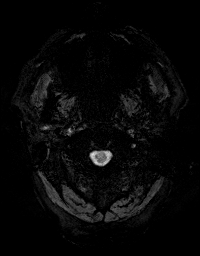
[im 33/97]
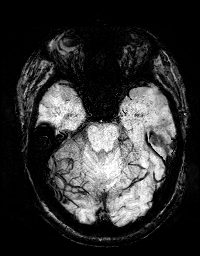
[im 65/97]
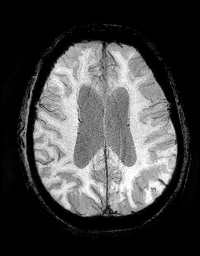
[im 97/97]
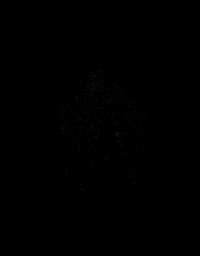

[Series 8: FLAIR · axial · 3.0mm · 0.90mm/px · z∈[-100,+83]mm · 3 of 62 slices shown (2 of 2)]
[im 1/62]
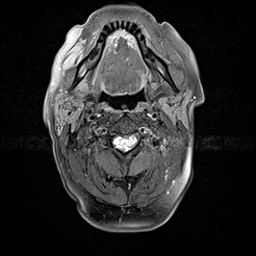
[im 31/62]
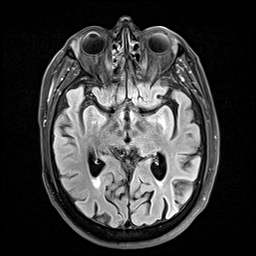
[im 62/62]
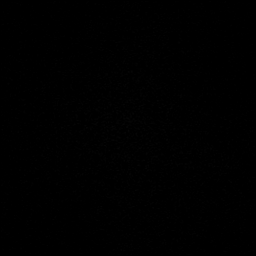

[Series 9: T2 · axial · non-contrast · 1.0mm · 0.90mm/px · z∈[-87,+84]mm · 8 of 176 slices shown (2 of 2)]
[im 1/176]
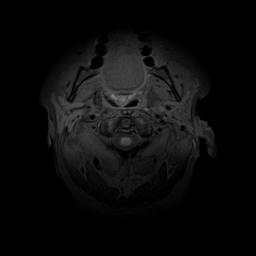
[im 26/176]
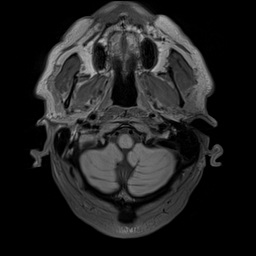
[im 51/176]
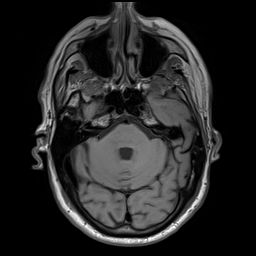
[im 76/176]
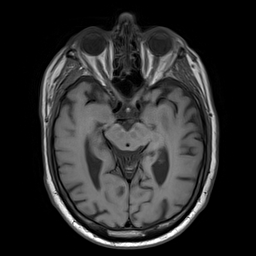
[im 101/176]
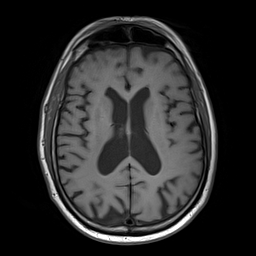
[im 126/176]
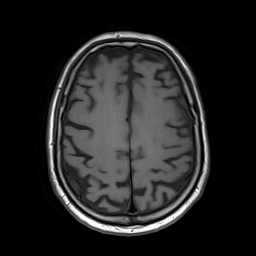
[im 151/176]
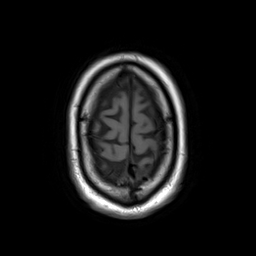
[im 176/176]
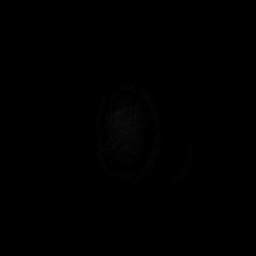

[Series 10: T2 post-contrast · coronal · 3.0mm · 0.57mm/px · 2 of 48 slices shown (1 of 2)]
[im 1/48]
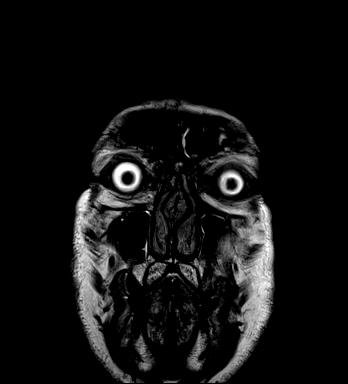
[im 48/48]
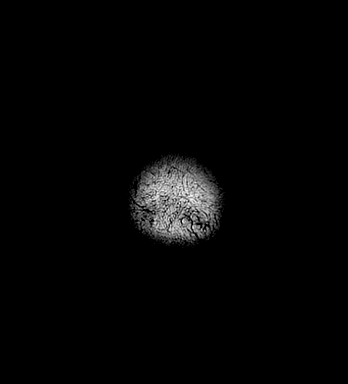

[Series 11: T2 post-contrast · axial · 1.0mm · 0.90mm/px · z∈[-87,+84]mm · 8 of 176 slices shown (2 of 2)]
[im 1/176]
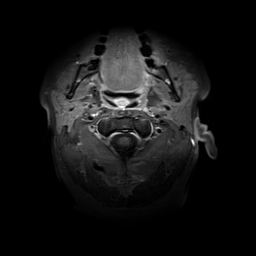
[im 26/176]
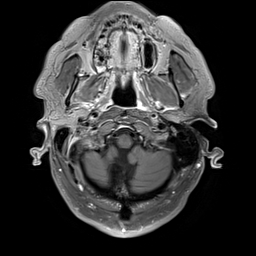
[im 51/176]
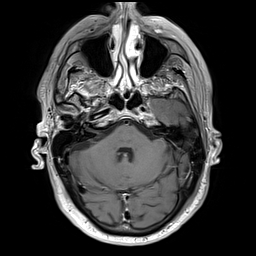
[im 76/176]
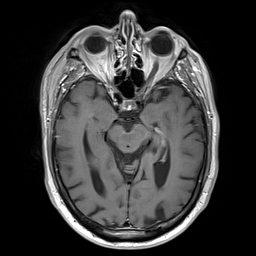
[im 101/176]
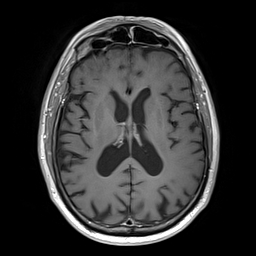
[im 126/176]
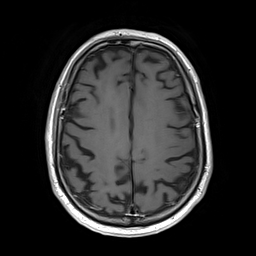
[im 151/176]
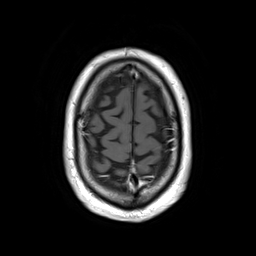
[im 176/176]
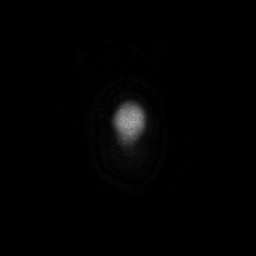

[Series 12: T1 post-contrast · axial · 1.0mm · 0.75mm/px · z∈[-81,+78]mm · 7 of 160 slices shown (1 of 2)]
[im 1/160]
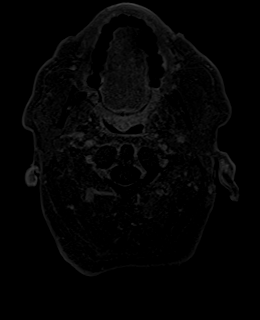
[im 27/160]
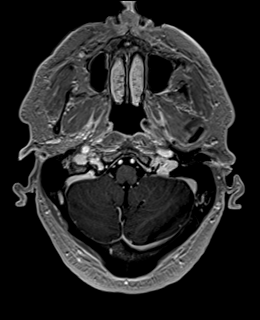
[im 54/160]
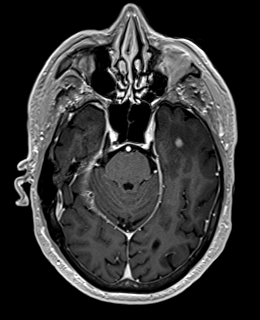
[im 80/160]
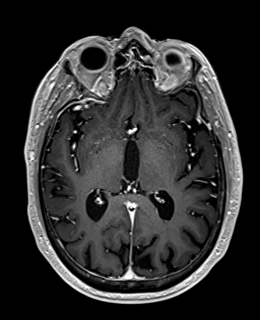
[im 107/160]
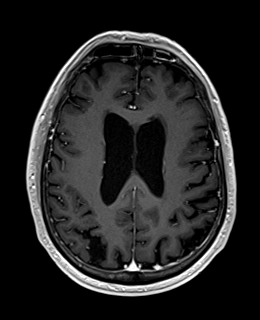
[im 133/160]
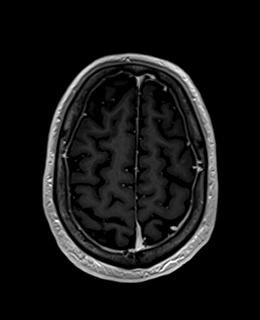
[im 160/160]
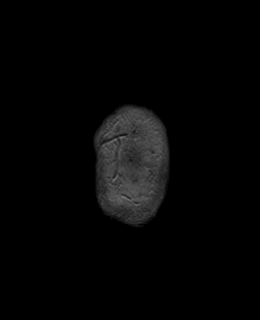

[Series 13: T1 post-contrast · coronal · 3.0mm · 0.57mm/px · 2 of 48 slices shown (2 of 2)]
[im 1/48]
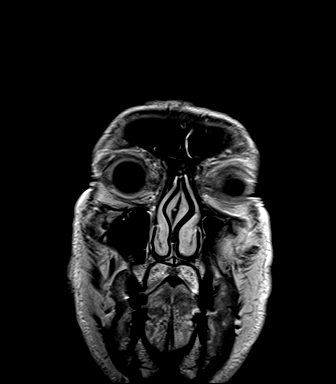
[im 48/48]
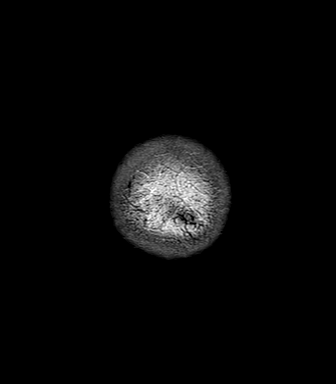

[Series 14: FLAIR post-contrast · sagittal · 3.0mm · 0.75mm/px · 2 of 39 slices shown]
[im 1/39]
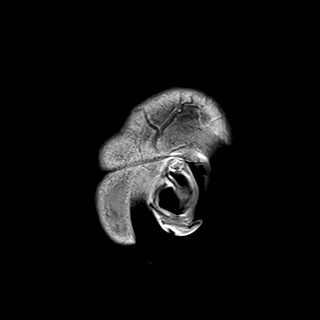
[im 39/39]
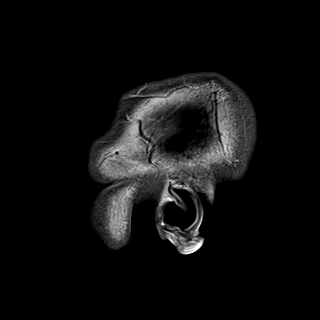

[48 of 48 positions shown; findings below may reference images not displayed]

FINDINGS: Brain: Interval enlargement of the small treated left
periventricular temporal lobe enhancing lesion from only 5-6 mm in
[REDACTED] now up to 13-14 mm. The lesion is larger and more solidly
enhancing than at the initial presentation in [REDACTED]. See series 11,
image 66 and series 14, image 30.

Confluent new T2 and FLAIR hyperintensity about the lesion also
suggesting regional vasogenic edema (series 8, image 25). The lesion
is mostly T2 hypointense, and dark on ADC but only faintly increased
on trace diffusion. The involved left temporal lobe is now mildly
expanded (series 10, image 31).

No other abnormal enhancement or new brain metastasis identified. No
dural thickening.

No superimposed No restricted diffusion to suggest acute infarction.
No midline shift, ventriculomegaly, extra-axial collection or acute
intracranial hemorrhage. Cervicomedullary junction and pituitary are
within normal limits.

Outside of the left temporal lobe gray and white matter signal is
stable. No cortical encephalomalacia or definite chronic cerebral
blood products.

Vascular: Major intracranial vascular flow voids are stable. The
major dural venous structures appear to be enhancing and patent.

Skull and upper cervical spine: Partially visible cervical spine
degeneration. Visualized bone marrow signal is within normal limits.

Sinuses/Orbits: Stable, negative.

Other: Trace left mastoid air cell fluid is stable. Visible internal
auditory structures appear normal. Negative visible scalp and face.
IMPRESSION: Enlarged and almost tripled size of the solitary treated left
temporal lobe metastasis since [REDACTED], with new regional vasogenic
edema.

But no new metastatic disease identified. No significant
intracranial mass effect.

Favor this is pseudoprogression / treatment effect rather than
genuine progression of disease at this time.

## 2021-03-12 MED ORDER — GADOBENATE DIMEGLUMINE 529 MG/ML IV SOLN
17.0000 mL | Freq: Once | INTRAVENOUS | Status: AC | PRN
  Administered 2021-03-12: 17 mL via INTRAVENOUS

## 2021-03-12 MED ORDER — SODIUM CHLORIDE 0.9% FLUSH
10.0000 mL | INTRAVENOUS | Status: DC | PRN
  Administered 2021-03-12: 10 mL via INTRAVENOUS

## 2021-03-12 MED ORDER — HEPARIN SOD (PORK) LOCK FLUSH 100 UNIT/ML IV SOLN
500.0000 [IU] | Freq: Once | INTRAVENOUS | Status: AC
  Administered 2021-03-12: 500 [IU] via INTRAVENOUS

## 2021-03-12 NOTE — Progress Notes (Unsigned)
[facility] OFFICE PROGRESS NOTE  [name], MD [address] [place] [address]  DIAGNOSIS: Stage IV (T1b, N2, M1 a) non-small cell lung cancer, adenocarcinoma presented with left upper lobe lung nodule in addition to mediastinal lymphadenopathy and pleural-based metastasis as well as malignant left pleural effusion diagnosed in July 2022. The patient also has solitary left temporal brain metastasis.   Molecular Studies: Biomarker Findings Microsatellite status - MS-Stable Tumor Mutational Burden - 4 Muts/Mb Genomic Findings For a complete list of the genes assayed, please refer to the Appendix. VVO1YW V371G PTEN splice site [id number]>W - subclonal DOT1L S911L - subclonal RAD21 S271* RB1 loss exons 3-23 TP53 N333f*34 8 Disease relevant genes with no reportable alterations: ALK, BRAF, EGFR, ERBB2, KRAS, MET, RET, ROS1  PRIOR THERAPY: SRS to the metastatic brain lesion under the care of Dr. MLisbeth Renshaw Completed on 09/03/20.  CURRENT THERAPY: Systemic chemotherapy with carboplatin for AUC of 5, Alimta 500 Mg/M2 and Keytruda 200 Mg IV every 3 weeks.  First dose on 08/13/20.  Status post 9 cycles.  Starting from cycle #5, the patient started maintenance treatment with alimta and [place]     INTERVAL HISTORY: Peter Thang81y.o. male returns  to the clinic today for a follow-up visit accompanied by his wife. The patient is feeling fairly well today without any concerning complaints. The patient is currently undergoing treatment with maintenance Alimta and Keytruda.  He is tolerating maintenance well except for poor taste for 6 days after treatment.  the patient denies any recent fever, chills, night sweats, or unexplained weight loss.  Denies any hemoptysis. He reports stable to slightly improved dyspnea on exertion. He has intermittent/ "some" cough but "not a lot". If he coughs, he reports it produces yellow phlegm. He denies any vomiting or diarrhea. He has mild  constipation for which he uses Doculax which is helpful for him. He has mild nausea. He does not think compazine is effective for him. He has some skin lesion on the dorsal aspect of his hands. They itch mildly. He denies any headache or visual changes.  He is here today for evaluation and repeat blood work before starting cycle #10.    MEDICAL HISTORY: Past Medical History:  Diagnosis Date   [place]    Lung cancer metastatic [facility] brain ([facility]     ALLERGIES:  has no active allergies.  MEDICATIONS:  Current Outpatient Medications  Medication Sig Dispense Refill   aspirin 81 MG EC tablet Take by mouth.     Bacillus Coagulans-Inulin (PROBIOTIC) 1-250 BILLION-MG CAPS Take 1 tablet by mouth daily.     bisacodyl (DULCOLAX) 5 MG EC tablet Take 5 mg by mouth 3 (three) times daily as needed for moderate constipation.     coal tar-salicylic acid 2 % shampoo Apply topically daily as needed for itching.     DULoxetine (CYMBALTA) 30 MG capsule Take 30 mg by mouth daily.     fluocinonide cream (LIDEX) 05.46% Apply 1 application topically 2 (two) times daily.     folic acid (FOLVITE) 1 MG tablet TAKE 1 TABLET(1 MG) BY MOUTH DAILY 30 tablet 4   Glucosamine-Chondroitin 250-200 MG TABS Take 1 tablet by mouth daily.     hydrocortisone cream 1 % Apply 1 application topically 2 (two) times daily. 453.6 g 0   ketoconazole (NIZORAL) 2 % cream Apply 1 application topically daily.     Krill Oil (OMEGA-3) 500 MG CAPS Take by mouth.     lidocaine-prilocaine (  EMLA) cream Apply 1 application topically as needed. 30 g 1   loratadine (CLARITIN) 10 MG tablet Take by mouth.     Magnesium 250 MG TABS Take by mouth.     omeprazole (PRILOSEC) 40 MG capsule Take 40 mg by mouth daily.     ondansetron (ZOFRAN-ODT) 4 MG disintegrating tablet Take 1 tablet (4 mg total) by mouth every 8 (eight) hours as needed. (Patient not taking: Reported on 02/23/2021) 20 tablet 0   prochlorperazine (COMPAZINE) 10 MG tablet TAKE 1  TABLET(10 MG) BY MOUTH EVERY 6 HOURS AS NEEDED FOR NAUSEA OR VOMITING 30 tablet 0   triamcinolone cream (KENALOG) 0.1 % Apply 1 application topically daily as needed. 453 g 6   No current facility-administered medications for this visit.   Facility-Administered Medications Ordered in Other Visits  Medication Dose Route Frequency Provider Last Rate Last Admin   sodium chloride flush (NS) 0.9 % injection 10 mL  10 mL Intravenous PRN Ventura Sellers, MD   10 mL at 03/12/21 1152    SURGICAL HISTORY:  Past Surgical History:  Procedure Laterality Date   IR IMAGING GUIDED PORT INSERTION  11/24/2020   KNEE SURGERY     orthoscopic     TONSILLECTOMY AND ADENOIDECTOMY      REVIEW OF SYSTEMS:   Review of Systems  Constitutional: Negative for appetite change, chills, fatigue, fever and unexpected weight change.  HENT:   Negative for mouth sores, nosebleeds, sore throat and trouble swallowing.   Eyes: Negative for eye problems and icterus.  Respiratory: Negative for cough, hemoptysis, shortness of breath and wheezing.   Cardiovascular: Negative for chest pain and leg swelling.  Gastrointestinal: Negative for abdominal pain, constipation, diarrhea, nausea and vomiting.  Genitourinary: Negative for bladder incontinence, difficulty urinating, dysuria, frequency and hematuria.   Musculoskeletal: Negative for back pain, gait problem, neck pain and neck stiffness.  Skin: Negative for itching and rash.  Neurological: Negative for dizziness, extremity weakness, gait problem, headaches, light-headedness and seizures.  Hematological: Negative for adenopathy. Does not bruise/bleed easily.  Psychiatric/Behavioral: Negative for confusion, depression and sleep disturbance. The patient is not nervous/anxious.     PHYSICAL EXAMINATION:  There were no vitals taken for this visit.  ECOG PERFORMANCE STATUS: {CHL ONC ECOG [id number]  Physical Exam  Constitutional: Oriented to person, place, and time and  well-developed, well-nourished, and in no distress. No distress.  HENT:  Head: Normocephalic and atraumatic.  Mouth/Throat: Oropharynx is clear and moist. No oropharyngeal exudate.  Eyes: Conjunctivae are normal. Right eye exhibits no discharge. Left eye exhibits no discharge. No scleral icterus.  Neck: Normal range of motion. Neck supple.  Cardiovascular: Normal rate, regular rhythm, normal heart sounds and intact distal pulses.   Pulmonary/Chest: Effort normal and breath sounds normal. No respiratory distress. No wheezes. No rales.  Abdominal: Soft. Bowel sounds are normal. Exhibits no distension and no mass. There is no tenderness.  Musculoskeletal: Normal range of motion. Exhibits no edema.  Lymphadenopathy:    No cervical adenopathy.  Neurological: Alert and oriented to person, place, and time. Exhibits normal muscle tone. Gait normal. Coordination normal.  Skin: Skin is warm and dry. No rash noted. Not diaphoretic. No erythema. No pallor.  Psychiatric: Mood, memory and judgment normal.  Vitals reviewed.  LABORATORY DATA: Lab Results  Component Value Date   WBC 6.3 02/23/2021   HGB 11.9 (L) 02/23/2021   HCT 34.0 (L) 02/23/2021   MCV 96.0 02/23/2021   PLT 210 02/23/2021  Chemistry      Component Value Date/Time   NA 139 02/23/2021 1009   K 4.7 02/23/2021 1009   CL 105 02/23/2021 1009   CO2 29 02/23/2021 1009   BUN 11 02/23/2021 1009   CREATININE 1.02 02/23/2021 1009      Component Value Date/Time   CALCIUM 9.3 02/23/2021 1009   ALKPHOS 81 02/23/2021 1009   AST 21 02/23/2021 1009   ALT 10 02/23/2021 1009   BILITOT 0.4 02/23/2021 1009       RADIOGRAPHIC STUDIES:  CT Chest W Contrast  Result Date: 02/20/2021 CLINICAL DATA:  Primary Cancer Type: Lung Imaging Indication: Assess response to therapy Interval therapy since last imaging? Yes Initial Cancer Diagnosis Date: 07/21/2020; Established by: Biopsy-proven Detailed Pathology: Stage IV non-small cell lung  cancer, adenocarcinoma. Primary Tumor location:  Left upper lobe. Surgeries: No. Chemotherapy: Yes; Ongoing? Yes; Most recent administration: 02/02/2021 Immunotherapy?  Yes; Type: Keytruda; Ongoing? Yes Radiation therapy? Yes; Date Range: 09/03/2020; Target: SRS to the metastatic brain lesion EXAM: CT CHEST, ABDOMEN, AND PELVIS WITH CONTRAST TECHNIQUE: Multidetector CT imaging of the chest, abdomen and pelvis was performed following the standard protocol during bolus administration of intravenous contrast. RADIATION DOSE REDUCTION: This exam was performed according to the departmental dose-optimization program which includes automated exposure control, adjustment of the mA and/or kV according to patient size and/or use of iterative reconstruction technique. CONTRAST:  182m OMNIPAQUE IOHEXOL 300 MG/ML  SOLN COMPARISON:  CT abdomen pelvis 02/19/2021, CT chest, abdomen and pelvis 12/18/2020. PET-CT 08/01/2020. FINDINGS: CT CHEST FINDINGS Cardiovascular: Right IJ Port-A-Cath terminates in the high right atrium. Atherosclerotic calcification of the aorta, aortic valve and coronary arteries. Heart size within normal limits. No pericardial effusion. Mediastinum/Nodes: No pathologically enlarged mediastinal, hilar or axillary lymph nodes. Esophagus is unremarkable. Lungs/Pleura: Basilar predominant subpleural ground-glass and mild traction bronchiectasis/bronchiolectasis. Findings are similar to 12/18/2020. Subpleural nodular density in the anterior left upper lobe measures 5 mm (7/77), unchanged. Slightly more inferiorly is a new 6 mm subpleural nodule in the anteromedial left upper lobe (7/81). No pleural fluid. Airway is unremarkable. Musculoskeletal: Degenerative changes in the spine. No worrisome lytic or sclerotic lesions. CT ABDOMEN PELVIS FINDINGS Hepatobiliary: Liver and gallbladder are unremarkable. No biliary ductal dilatation. Pancreas: Negative. Spleen: Negative. Adrenals/Urinary Tract: Adrenal glands are  unremarkable. Scarring in the kidneys. Subcentimeter low-attenuation lesion in the interpolar right kidney is too small to characterize. 1.9 cm fluid density lesion in the left kidney is consistent with a cyst. Kidneys are otherwise unremarkable. Ureters are decompressed. Bladder is low in volume. Stomach/Bowel: Tiny hiatal hernia. Stomach, small bowel, appendix and colon are unremarkable. Vascular/Lymphatic: Atherosclerotic calcification of the aorta. No pathologically enlarged lymph nodes. Reproductive: Prostate may be minimally prominent. Other: No free fluid.  Mesenteries and peritoneum are unremarkable. Musculoskeletal: Grade 2 anterolisthesis of L5 on S1 secondary to bilateral L5 pars defects. Advanced secondary degenerative disc disease. No worrisome lytic or sclerotic lesions. IMPRESSION: 1. New 6 mm subpleural anteromedial left upper lobe nodule. Short-term follow-up CT chest without contrast in 3 months is recommended. 2. Basilar predominant subpleural ground-glass and traction bronchiectasis/bronchiolectasis, similar to 12/18/2020. Findings can be seen with interstitial lung disease such as nonspecific interstitial pneumonitis. Usual interstitial pneumonitis is not excluded. 3. Aortic atherosclerosis (ICD10-I70.0). Coronary artery calcification. Electronically Signed   By: MLorin PicketM.D.   On: 02/20/2021 10:31   MR Brain W Wo Contrast  Result Date: 03/12/2021 CLINICAL DATA:  81year old male with metastatic lung cancer. Solitary treated left temporal lobe metastasis.  Restaging. EXAM: MRI HEAD WITHOUT AND WITH CONTRAST TECHNIQUE: Multiplanar, multiecho pulse sequences of the brain and surrounding structures were obtained without and with intravenous contrast. CONTRAST:  69m MULTIHANCE GADOBENATE DIMEGLUMINE 529 MG/ML IV SOLN COMPARISON:  12/11/2020 and earlier. FINDINGS: Brain: Interval enlargement of the small treated left periventricular temporal lobe enhancing lesion from only 5-6 mm in  December now up to 13-14 mm. The lesion is larger and more solidly enhancing than at the initial presentation in August. See series 11, image 66 and series 14, image 30. Confluent new T2 and FLAIR hyperintensity about the lesion also suggesting regional vasogenic edema (series 8, image 25). The lesion is mostly T2 hypointense, and dark on ADC but only faintly increased on trace diffusion. The involved left temporal lobe is now mildly expanded (series 10, image 31). No other abnormal enhancement or new brain metastasis identified. No dural thickening. No superimposed No restricted diffusion to suggest acute infarction. No midline shift, ventriculomegaly, extra-axial collection or acute intracranial hemorrhage. Cervicomedullary junction and pituitary are within normal limits. Outside of the left temporal lobe gray and white matter signal is stable. No cortical encephalomalacia or definite chronic cerebral blood products. Vascular: Major intracranial vascular flow voids are stable. The major dural venous structures appear to be enhancing and patent. Skull and upper cervical spine: Partially visible cervical spine degeneration. Visualized bone marrow signal is within normal limits. Sinuses/Orbits: Stable, negative. Other: Trace left mastoid air cell fluid is stable. Visible internal auditory structures appear normal. Negative visible scalp and face. IMPRESSION: Enlarged and almost tripled size of the solitary treated left temporal lobe metastasis since December, with new regional vasogenic edema. But no new metastatic disease identified. No significant intracranial mass effect. FBurtis Junesthis is pseudoprogression / treatment effect rather than genuine progression of disease at this time. Electronically Signed   By: HGenevie AnnM.D.   On: 03/12/2021 13:54   CT Abdomen Pelvis W Contrast  Result Date: 02/20/2021 CLINICAL DATA:  Primary Cancer Type: Lung Imaging Indication: Assess response to therapy Interval therapy since last  imaging? Yes Initial Cancer Diagnosis Date: 07/21/2020; Established by: Biopsy-proven Detailed Pathology: Stage IV non-small cell lung cancer, adenocarcinoma. Primary Tumor location:  Left upper lobe. Surgeries: No. Chemotherapy: Yes; Ongoing? Yes; Most recent administration: 02/02/2021 Immunotherapy?  Yes; Type: Keytruda; Ongoing? Yes Radiation therapy? Yes; Date Range: 09/03/2020; Target: SRS to the metastatic brain lesion EXAM: CT CHEST, ABDOMEN, AND PELVIS WITH CONTRAST TECHNIQUE: Multidetector CT imaging of the chest, abdomen and pelvis was performed following the standard protocol during bolus administration of intravenous contrast. RADIATION DOSE REDUCTION: This exam was performed according to the departmental dose-optimization program which includes automated exposure control, adjustment of the mA and/or kV according to patient size and/or use of iterative reconstruction technique. CONTRAST:  1028mOMNIPAQUE IOHEXOL 300 MG/ML  SOLN COMPARISON:  CT abdomen pelvis 02/19/2021, CT chest, abdomen and pelvis 12/18/2020. PET-CT 08/01/2020. FINDINGS: CT CHEST FINDINGS Cardiovascular: Right IJ Port-A-Cath terminates in the high right atrium. Atherosclerotic calcification of the aorta, aortic valve and coronary arteries. Heart size within normal limits. No pericardial effusion. Mediastinum/Nodes: No pathologically enlarged mediastinal, hilar or axillary lymph nodes. Esophagus is unremarkable. Lungs/Pleura: Basilar predominant subpleural ground-glass and mild traction bronchiectasis/bronchiolectasis. Findings are similar to 12/18/2020. Subpleural nodular density in the anterior left upper lobe measures 5 mm (7/77), unchanged. Slightly more inferiorly is a new 6 mm subpleural nodule in the anteromedial left upper lobe (7/81). No pleural fluid. Airway is unremarkable. Musculoskeletal: Degenerative changes in the spine. No worrisome lytic  or sclerotic lesions. CT ABDOMEN PELVIS FINDINGS Hepatobiliary: Liver and  gallbladder are unremarkable. No biliary ductal dilatation. Pancreas: Negative. Spleen: Negative. Adrenals/Urinary Tract: Adrenal glands are unremarkable. Scarring in the kidneys. Subcentimeter low-attenuation lesion in the interpolar right kidney is too small to characterize. 1.9 cm fluid density lesion in the left kidney is consistent with a cyst. Kidneys are otherwise unremarkable. Ureters are decompressed. Bladder is low in volume. Stomach/Bowel: Tiny hiatal hernia. Stomach, small bowel, appendix and colon are unremarkable. Vascular/Lymphatic: Atherosclerotic calcification of the aorta. No pathologically enlarged lymph nodes. Reproductive: Prostate may be minimally prominent. Other: No free fluid.  Mesenteries and peritoneum are unremarkable. Musculoskeletal: Grade 2 anterolisthesis of L5 on S1 secondary to bilateral L5 pars defects. Advanced secondary degenerative disc disease. No worrisome lytic or sclerotic lesions. IMPRESSION: 1. New 6 mm subpleural anteromedial left upper lobe nodule. Short-term follow-up CT chest without contrast in 3 months is recommended. 2. Basilar predominant subpleural ground-glass and traction bronchiectasis/bronchiolectasis, similar to 12/18/2020. Findings can be seen with interstitial lung disease such as nonspecific interstitial pneumonitis. Usual interstitial pneumonitis is not excluded. 3. Aortic atherosclerosis (ICD10-I70.0). Coronary artery calcification. Electronically Signed   By: Lorin Picket M.D.   On: 02/20/2021 10:31     ASSESSMENT/PLAN:  This is a very pleasant 81 year old Caucasian male diagnosed with stage IV (T1b, N2, M1A) non-small cell lung cancer, adenocarcinoma.  The patient presented with a left upper lobe lung nodule in addition to mediastinal lymphadenopathy and pleural-based metastasis as well as a malignant left pleural effusion.  The patient was diagnosed in July 2022.  The patient also had a metastatic brain lesion.   The patient completed SRS to  the brain lesion on 09/03/2020 under the care of Dr. [name].  The patient is currently undergoing systemic chemotherapy with carboplatin for an AUC of 5, Alimta 500 mg per metered squared, Keytruda 200 mg IV every 3 weeks.  The patient is status post 9 cycles.  Starting from cycle #5, the patient started maintenance Alimta and Keytruda  The patient was seen with Dr. Julien Nordmann. Labs were reviewed. Recommend that he *** with cycle #10 today as scheduled.   We will see him back for a follow up visit in 3 weeks for evaluation and repeat blood work before starting cycle #11.   The patient was advised to call immediately if he has any concerning symptoms in the interval. The patient voices understanding of current disease status and treatment options and is in agreement with the current care plan. All questions were answered. The patient knows to call the clinic with any problems, questions or concerns. We can certainly see the patient much sooner if necessary       No orders of the defined types were placed in this encounter.    I spent {CHL ONC TIME VISIT - LTJQZ:[id number]} counseling the patient face to face. The total time spent in the appointment was {CHL ONC TIME VISIT - AUQJF:[id number]}.  Peter Wiler L Marga Gramajo, PA-C 03/12/21

## 2021-03-16 ENCOUNTER — Other Ambulatory Visit: Payer: Medicare HMO

## 2021-03-16 ENCOUNTER — Ambulatory Visit
Admission: RE | Admit: 2021-03-16 | Discharge: 2021-03-16 | Disposition: A | Discharge status: Home / Self Care | Payer: Medicare HMO | Source: Ambulatory Visit | Attending: Radiation Oncology | Admitting: Radiation Oncology

## 2021-03-16 ENCOUNTER — Ambulatory Visit: Payer: Medicare HMO

## 2021-03-16 ENCOUNTER — Ambulatory Visit: Payer: Medicare HMO | Admitting: Physician Assistant

## 2021-03-16 ENCOUNTER — Encounter: Payer: Self-pay | Admitting: Radiation Oncology

## 2021-03-16 DIAGNOSIS — C3492 Malignant neoplasm of unspecified part of left bronchus or lung: Secondary | ICD-10-CM

## 2021-03-16 DIAGNOSIS — C7931 Secondary malignant neoplasm of brain: Secondary | ICD-10-CM

## 2021-03-16 NOTE — Progress Notes (Signed)
Spoke w/ patient, verified identity, and began nursing interview. Patient reports some moderate shortness of breath. He denies any pain, and otherwise is doing well. ? ?Meaningful use complete. ? ?Patient notified of his 1:30pm-03/16/21 telephone appointment w/ Shona Simpson PA-C. I left my extension [phone] in case patient needs anything. Patient verbalized understanding of information. ? ?Patient contact [phone] ?

## 2021-03-16 NOTE — Progress Notes (Signed)
Radiation Oncology         [phone] ________________________________  Outpatient Follow Up - Conducted via telephone due to current COVID-19 concerns for limiting patient exposure  I spoke with the patient to conduct this consult visit via telephone to spare the patient unnecessary potential exposure in the healthcare setting during the current COVID-19 pandemic. The patient was notified in advance and was offered a [place] meeting to allow for face to face communication but unfortunately reported that they did not have the appropriate resources/technology to support such a visit and instead preferred to proceed with a telephone visit.  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 A diagnosis of Adenocarcinoma, lung, left ([place]) was also pertinent to this visit.   HISTORY OF PRESENT ILLNESS: Peter Brown is a 81 y.o. male with a diagnosis of stage IV lung cancer.  The patient had been experiencing left chest wall discomfort and discomfort in the back, he was evaluated with chest x-ray which showed a left pleural effusion.  He was sent for evaluation with IR and underwent thoracentesis with successful drainage of 650 mL of pleural fluid on 07/21/2020.  Final cytology showed malignant cells consistent with adenocarcinoma.  He underwent pet imaging on 08/01/2020 which revealed a 14 mm left upper lobe pulmonary nodule that was hypermetabolic with an SUV of up to 10.1.  AP window adenopathy was hypermetabolic as well as some smaller pleural nodules with ranging SUVs between 3 and 4 consistent with an associated left malignant pleural effusion.  No other evidence of distant disease was identified.  Screening MRI for staging on 08/13/2020 showed a 1 cm enhancing lesion in the left  temporal lobe.  He began systemic  chemo immunotherapy on 08/13/2020.  He received single fraction stereotactic radiosurgery on 09/03/20 which he tolerated well.   He had a repeat MRI of the brain for his first baseline comparison on 12/11/20. This showed further decrease in size of his previously treated left temporal lesion, now measuring 4 mm. This had been 10 mm at the time of treatment.  He had an MRI scan on 03/12/2021 of the brain which showed enlargement in the treated left temporal lobe metastases region with new regional vasogenic edema.  The area measured up to 14 mm was more solidly enhancing than previous with confluent new T2 and flair hyperintensity about the lesion suggesting regional vasogenic edema.  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  He continues with Alimta and Keytruda and is due for his next infusion tomorrow.  He is contacted by phone to review these results.  PREVIOUS RADIATION THERAPY:   09/03/2020 through 09/03/2020 SRS Treatment Site Technique Total Dose (Gy) Dose per Fx (Gy) Completed Fx Beam Energies  Brain: Brain [id number] 3D 20/20 20 1/1 [id number]     PAST MEDICAL HISTORY:  Past Medical History:  Diagnosis Date   Lung cancer ([place])    Lung cancer metastatic to brain [facility])        PAST SURGICAL HISTORY: Past Surgical History:  Procedure Laterality Date   IR IMAGING GUIDED PORT INSERTION  11/24/2020   KNEE SURGERY     orthoscopic  TONSILLECTOMY AND ADENOIDECTOMY       FAMILY HISTORY: No family history on file.   SOCIAL HISTORY:  reports that he has never smoked. He has never used smokeless tobacco. He reports that he does not drink alcohol and does not use drugs. The patient is married and lives in [place], [place]. They have 9 grandchildren and enjoy supporting them. One grandson is very active in the  theatre.   ALLERGIES: Patient has no active allergies.   MEDICATIONS:  Current Outpatient Medications  Medication Sig Dispense Refill   aspirin 81 MG EC tablet Take by mouth.     Bacillus Coagulans-Inulin (PROBIOTIC) 1-250 BILLION-MG CAPS Take 1 tablet by mouth daily.     bisacodyl (DULCOLAX) 5 MG EC tablet Take 5 mg by mouth 3 (three) times daily as needed for moderate constipation.     coal tar-salicylic acid 2 % shampoo Apply topically daily as needed for itching.     DULoxetine (CYMBALTA) 30 MG capsule Take 30 mg by mouth daily.     fluocinonide cream (LIDEX) 4.85 % Apply 1 application topically 2 (two) times daily.     folic acid (FOLVITE) 1 MG tablet TAKE 1 TABLET(1 MG) BY MOUTH DAILY 30 tablet 4   Glucosamine-Chondroitin 250-200 MG TABS Take 1 tablet by mouth daily. (Patient not taking: Reported on 03/16/2021)     hydrocortisone cream 1 % Apply 1 application topically 2 (two) times daily. 453.6 g 0   ketoconazole (NIZORAL) 2 % cream Apply 1 application topically daily.     Krill Oil (OMEGA-3) 500 MG CAPS Take by mouth.     lidocaine-prilocaine (EMLA) cream Apply 1 application topically as needed. 30 g 1   loratadine (CLARITIN) 10 MG tablet Take by mouth.     Magnesium 250 MG TABS Take by mouth.     omeprazole (PRILOSEC) 40 MG capsule Take 40 mg by mouth daily.     ondansetron (ZOFRAN-ODT) 4 MG disintegrating tablet Take 1 tablet (4 mg total) by mouth every 8 (eight) hours as needed. (Patient not taking: Reported on 02/23/2021) 20 tablet 0   prochlorperazine (COMPAZINE) 10 MG tablet TAKE 1 TABLET(10 MG) BY MOUTH EVERY 6 HOURS AS NEEDED FOR NAUSEA OR VOMITING 30 tablet 0   triamcinolone cream (KENALOG) 0.1 % Apply 1 application topically daily as needed. 453 g 6   No current facility-administered medications for this encounter.     REVIEW OF SYSTEMS: On review of systems, the patient reports that he is doing pretty well overall.  He has had a few episodes per month of a headache,  he is found Tylenol to be helpful at relieving his symptoms, he denies any significant change to his quality of life on the days where he experiences headache and is not laying in bed all day as a result.  He denies any nausea or dizziness or change of motion or movement.  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  He denies any nausea outside of what is expected from his chemotherapy regimen.  No other complaints are verbalized.   PHYSICAL EXAM:  Unable to assess due to encounter type.  ECOG = 1  0 - Asymptomatic (Fully active, able to carry on all predisease activities without restriction)  1 - Symptomatic but completely ambulatory (Restricted in physically strenuous activity but ambulatory and able to carry out work of a light or sedentary nature. For example, light housework, office work)  2 - Symptomatic, <50% in  bed during the day (Ambulatory and capable of all self care but unable to carry out any work activities. Up and about more than 50% of waking hours)  3 - Symptomatic, >50% in bed, but not bedbound (Capable of only limited self-care, confined to bed or chair 50% or more of waking hours)  4 - Bedbound (Completely disabled. Cannot carry on any self-care. Totally confined to bed or chair)  5 - Death   Eustace Pen MM, Creech RH, Tormey DC, et al. [phone]). "Toxicity and response criteria of the [facility]". [place] Oncol. 5 (6): 649-55    LABORATORY DATA:  Lab Results  Component Value Date   WBC 6.3 02/23/2021   HGB 11.9 (L) 02/23/2021   HCT 34.0 (L) 02/23/2021   MCV 96.0 02/23/2021   PLT 210 02/23/2021   Lab Results  Component Value Date   NA 139 02/23/2021   K 4.7 02/23/2021   CL 105 02/23/2021   CO2 29 02/23/2021   Lab Results  Component Value Date   ALT 10 02/23/2021   AST 21 02/23/2021   ALKPHOS 81 02/23/2021   BILITOT 0.4 02/23/2021      RADIOGRAPHY: CT Chest W Contrast  Result Date:  02/20/2021 CLINICAL DATA:  Primary Cancer Type: Lung Imaging Indication: Assess response to therapy Interval therapy since last imaging? Yes Initial Cancer Diagnosis Date: 07/21/2020; Established by: Biopsy-proven Detailed Pathology: Stage IV non-small cell lung cancer, adenocarcinoma. Primary Tumor location:  Left upper lobe. Surgeries: No. Chemotherapy: Yes; Ongoing? Yes; Most recent administration: 02/02/2021 Immunotherapy?  Yes; Type: Keytruda; Ongoing? Yes Radiation therapy? Yes; Date Range: 09/03/2020; Target: SRS to the metastatic brain lesion EXAM: CT CHEST, ABDOMEN, AND PELVIS WITH CONTRAST TECHNIQUE: Multidetector CT imaging of the chest, abdomen and pelvis was performed following the standard protocol during bolus administration of intravenous contrast. RADIATION DOSE REDUCTION: This exam was performed according to the departmental dose-optimization program which includes automated exposure control, adjustment of the mA and/or kV according to patient size and/or use of iterative reconstruction technique. CONTRAST:  156mL OMNIPAQUE IOHEXOL 300 MG/ML  SOLN COMPARISON:  CT abdomen pelvis 02/19/2021, CT chest, abdomen and pelvis 12/18/2020. PET-CT 08/01/2020. FINDINGS: CT CHEST FINDINGS Cardiovascular: Right IJ Port-A-Cath terminates in the high right atrium. Atherosclerotic calcification of the aorta, aortic valve and coronary arteries. Heart size within normal limits. No pericardial effusion. Mediastinum/Nodes: No pathologically enlarged mediastinal, hilar or axillary lymph nodes. Esophagus is unremarkable. Lungs/Pleura: Basilar predominant subpleural ground-glass and mild traction bronchiectasis/bronchiolectasis. Findings are similar to 12/18/2020. Subpleural nodular density in the anterior left upper lobe measures 5 mm (7/77), unchanged. Slightly more inferiorly is a new 6 mm subpleural nodule in the anteromedial left upper lobe (7/81). No pleural fluid. Airway is unremarkable. Musculoskeletal:  Degenerative changes in the spine. No worrisome lytic or sclerotic lesions. CT ABDOMEN PELVIS FINDINGS Hepatobiliary: Liver and gallbladder are unremarkable. No biliary ductal dilatation. Pancreas: Negative. Spleen: Negative. Adrenals/Urinary Tract: Adrenal glands are unremarkable. Scarring in the kidneys. Subcentimeter low-attenuation lesion in the interpolar right kidney is too small to characterize. 1.9 cm fluid density lesion in the left kidney is consistent with a cyst. Kidneys are otherwise unremarkable. Ureters are decompressed. Bladder is low in volume. Stomach/Bowel: Tiny hiatal hernia. Stomach, small bowel, appendix and colon are unremarkable. Vascular/Lymphatic: Atherosclerotic calcification of the aorta. No pathologically enlarged lymph nodes. Reproductive: Prostate may be minimally prominent. Other: No free fluid.  Mesenteries and peritoneum are unremarkable. Musculoskeletal: Grade 2 anterolisthesis of L5 on S1 secondary to bilateral L5  pars defects. Advanced secondary degenerative disc disease. No worrisome lytic or sclerotic lesions. IMPRESSION: 1. New 6 mm subpleural anteromedial left upper lobe nodule. Short-term follow-up CT chest without contrast in 3 months is recommended. 2. Basilar predominant subpleural ground-glass and traction bronchiectasis/bronchiolectasis, similar to 12/18/2020. Findings can be seen with interstitial lung disease such as nonspecific interstitial pneumonitis. Usual interstitial pneumonitis is not excluded. 3. Aortic atherosclerosis (ICD10-I70.0). Coronary artery calcification. Electronically Signed   By: Lorin Picket M.D.   On: 02/20/2021 10:31   MR Brain W Wo Contrast  Result Date: 03/12/2021 CLINICAL DATA:  81 year old male with metastatic lung cancer. Solitary treated left temporal lobe metastasis. Restaging. EXAM: MRI HEAD WITHOUT AND WITH CONTRAST TECHNIQUE: Multiplanar, multiecho pulse sequences of the brain and surrounding structures were obtained without and  with intravenous contrast. CONTRAST:  39mL MULTIHANCE GADOBENATE DIMEGLUMINE 529 MG/ML IV SOLN COMPARISON:  12/11/2020 and earlier. FINDINGS: Brain: Interval enlargement of the small treated left periventricular temporal lobe enhancing lesion from only 5-6 mm in December now up to 13-14 mm. The lesion is larger and more solidly enhancing than at the initial presentation in August. See series 11, image 66 and series 14, image 30. Confluent new T2 and FLAIR hyperintensity about the lesion also suggesting regional vasogenic edema (series 8, image 25). The lesion is mostly T2 hypointense, and dark on ADC but only faintly increased on trace diffusion. The involved left temporal lobe is now mildly expanded (series 10, image 31). No other abnormal enhancement or new brain metastasis identified. No dural thickening. No superimposed No restricted diffusion to suggest acute infarction. No midline shift, ventriculomegaly, extra-axial collection or acute intracranial hemorrhage. Cervicomedullary junction and pituitary are within normal limits. Outside of the left temporal lobe gray and white matter signal is stable. No cortical encephalomalacia or definite chronic cerebral blood products. Vascular: Major intracranial vascular flow voids are stable. The major dural venous structures appear to be enhancing and patent. Skull and upper cervical spine: Partially visible cervical spine degeneration. Visualized bone marrow signal is within normal limits. Sinuses/Orbits: Stable, negative. Other: Trace left mastoid air cell fluid is stable. Visible internal auditory structures appear normal. Negative visible scalp and face. IMPRESSION: Enlarged and almost tripled size of the solitary treated left temporal lobe metastasis since December, with new regional vasogenic edema. But no new metastatic disease identified. No significant intracranial mass effect. Burtis Junes this is pseudoprogression / treatment effect rather than genuine progression of  disease at this time. Electronically Signed   By: Genevie Ann M.D.   On: 03/12/2021 13:54   CT Abdomen Pelvis W Contrast  Result Date: 02/20/2021 CLINICAL DATA:  Primary Cancer Type: Lung Imaging Indication: Assess response to therapy Interval therapy since last imaging? Yes Initial Cancer Diagnosis Date: 07/21/2020; Established by: Biopsy-proven Detailed Pathology: Stage IV non-small cell lung cancer, adenocarcinoma. Primary Tumor location:  Left upper lobe. Surgeries: No. Chemotherapy: Yes; Ongoing? Yes; Most recent administration: 02/02/2021 Immunotherapy?  Yes; Type: Keytruda; Ongoing? Yes Radiation therapy? Yes; Date Range: 09/03/2020; Target: SRS to the metastatic brain lesion EXAM: CT CHEST, ABDOMEN, AND PELVIS WITH CONTRAST TECHNIQUE: Multidetector CT imaging of the chest, abdomen and pelvis was performed following the standard protocol during bolus administration of intravenous contrast. RADIATION DOSE REDUCTION: This exam was performed according to the departmental dose-optimization program which includes automated exposure control, adjustment of the mA and/or kV according to patient size and/or use of iterative reconstruction technique. CONTRAST:  193mL OMNIPAQUE IOHEXOL 300 MG/ML  SOLN COMPARISON:  CT abdomen pelvis 02/19/2021, CT  chest, abdomen and pelvis 12/18/2020. PET-CT 08/01/2020. FINDINGS: CT CHEST FINDINGS Cardiovascular: Right IJ Port-A-Cath terminates in the high right atrium. Atherosclerotic calcification of the aorta, aortic valve and coronary arteries. Heart size within normal limits. No pericardial effusion. Mediastinum/Nodes: No pathologically enlarged mediastinal, hilar or axillary lymph nodes. Esophagus is unremarkable. Lungs/Pleura: Basilar predominant subpleural ground-glass and mild traction bronchiectasis/bronchiolectasis. Findings are similar to 12/18/2020. Subpleural nodular density in the anterior left upper lobe measures 5 mm (7/77), unchanged. Slightly more inferiorly is a new 6  mm subpleural nodule in the anteromedial left upper lobe (7/81). No pleural fluid. Airway is unremarkable. Musculoskeletal: Degenerative changes in the spine. No worrisome lytic or sclerotic lesions. CT ABDOMEN PELVIS FINDINGS Hepatobiliary: Liver and gallbladder are unremarkable. No biliary ductal dilatation. Pancreas: Negative. Spleen: Negative. Adrenals/Urinary Tract: Adrenal glands are unremarkable. Scarring in the kidneys. Subcentimeter low-attenuation lesion in the interpolar right kidney is too small to characterize. 1.9 cm fluid density lesion in the left kidney is consistent with a cyst. Kidneys are otherwise unremarkable. Ureters are decompressed. Bladder is low in volume. Stomach/Bowel: Tiny hiatal hernia. Stomach, small bowel, appendix and colon are unremarkable. Vascular/Lymphatic: Atherosclerotic calcification of the aorta. No pathologically enlarged lymph nodes. Reproductive: Prostate may be minimally prominent. Other: No free fluid.  Mesenteries and peritoneum are unremarkable. Musculoskeletal: Grade 2 anterolisthesis of L5 on S1 secondary to bilateral L5 pars defects. Advanced secondary degenerative disc disease. No worrisome lytic or sclerotic lesions. IMPRESSION: 1. New 6 mm subpleural anteromedial left upper lobe nodule. Short-term follow-up CT chest without contrast in 3 months is recommended. 2. Basilar predominant subpleural ground-glass and traction bronchiectasis/bronchiolectasis, similar to 12/18/2020. Findings can be seen with interstitial lung disease such as nonspecific interstitial pneumonitis. Usual interstitial pneumonitis is not excluded. 3. Aortic atherosclerosis (ICD10-I70.0). Coronary artery calcification. Electronically Signed   By: Lorin Picket M.D.   On: 02/20/2021 10:31       IMPRESSION/PLAN: 1. 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  We reviewed the  findings from his recent MRI scan, I discussed with he and his wife that the discussion from conference this morning was that the findings most likely represent pseudoprogression rather than true disease.  The patient is somewhat symptomatic but declines active intervention with vitamin E or Trental at this time because his symptoms are managed well enough with Tylenol.  We would like to avoid steroids given the ongoing use of immunotherapy for his cancer overall.  We did discuss the rationale for a shorter interval scan which would be recommended 2 months from now.  The patient is in agreement with this and will also keep [name] informed of any concerns that develop prior to that visit or if he decides he would like to try vitamin E and trental.  Otherwise he will proceed with his next infusion of Alimta and Keytruda tomorrow. 2. Hearing loss.  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  I describes the stable and trace amount of fluid in the left mastoid.  We discussed over-the-counter medications to try and alleviate this if he developed muffled hearing on the left otherwise he will continue to follow-up with his audiology team.   Given current concerns for patient exposure during the COVID-19 pandemic, this encounter was conducted via telephone.  The patient has provided two factor identification and has given verbal consent for this type of  encounter and has been advised to only accept a meeting of this type in a secure network environment. The time spent during this encounter was 35 minutes including preparation, discussion, and coordination of the patient's care. The attendants for this meeting include  Hayden Pedro  and Fannie Alomar and his wife Tristan Proto.  During the encounter,    Hayden Pedro was located at home remotely.   Oletta Darter and his wife Bethena Roys were located at home.      Carola Rhine, [facility]   **Disclaimer: This note was  dictated with voice recognition software. Similar sounding words can inadvertently be transcribed and this note may contain transcription errors which may not have been corrected upon publication of note.**

## 2021-03-17 ENCOUNTER — Inpatient Hospital Stay: Payer: Medicare HMO

## 2021-03-17 ENCOUNTER — Inpatient Hospital Stay: Payer: Medicare HMO | Attending: Internal Medicine | Admitting: Physician Assistant

## 2021-03-17 ENCOUNTER — Other Ambulatory Visit: Payer: Self-pay

## 2021-03-17 VITALS — BP 135/77 | HR 71 | Temp 97.9°F | Resp 18 | Ht 73.0 in | Wt 187.0 lb

## 2021-03-17 DIAGNOSIS — J910 Malignant pleural effusion: Secondary | ICD-10-CM | POA: Diagnosis not present

## 2021-03-17 DIAGNOSIS — C3412 Malignant neoplasm of upper lobe, left bronchus or lung: Secondary | ICD-10-CM | POA: Insufficient documentation

## 2021-03-17 DIAGNOSIS — C3492 Malignant neoplasm of unspecified part of left bronchus or lung: Secondary | ICD-10-CM | POA: Diagnosis not present

## 2021-03-17 DIAGNOSIS — R609 Edema, unspecified: Secondary | ICD-10-CM | POA: Diagnosis not present

## 2021-03-17 DIAGNOSIS — Z5112 Encounter for antineoplastic immunotherapy: Secondary | ICD-10-CM | POA: Diagnosis not present

## 2021-03-17 DIAGNOSIS — C7931 Secondary malignant neoplasm of brain: Secondary | ICD-10-CM | POA: Diagnosis not present

## 2021-03-17 DIAGNOSIS — Z5111 Encounter for antineoplastic chemotherapy: Secondary | ICD-10-CM | POA: Diagnosis not present

## 2021-03-17 DIAGNOSIS — Z95828 Presence of other vascular implants and grafts: Secondary | ICD-10-CM

## 2021-03-17 DIAGNOSIS — Z79899 Other long term (current) drug therapy: Secondary | ICD-10-CM | POA: Diagnosis not present

## 2021-03-17 LAB — CBC WITH DIFFERENTIAL (CANCER CENTER ONLY)
Abs Immature Granulocytes: 0.02 10*3/uL (ref 0.00–0.07)
Basophils Absolute: 0.1 10*3/uL (ref 0.0–0.1)
Basophils Relative: 1 %
Eosinophils Absolute: 0.5 10*3/uL (ref 0.0–0.5)
Eosinophils Relative: 9 %
HCT: 34.4 % — ABNORMAL LOW (ref 39.0–52.0)
Hemoglobin: 11.7 g/dL — ABNORMAL LOW (ref 13.0–17.0)
Immature Granulocytes: 0 %
Lymphocytes Relative: 28 %
Lymphs Abs: 1.7 10*3/uL (ref 0.7–4.0)
MCH: 32.5 pg (ref 26.0–34.0)
MCHC: 34 g/dL (ref 30.0–36.0)
MCV: 95.6 fL (ref 80.0–100.0)
Monocytes Absolute: 0.9 10*3/uL (ref 0.1–1.0)
Monocytes Relative: 16 %
Neutro Abs: 2.7 10*3/uL (ref 1.7–7.7)
Neutrophils Relative %: 46 %
Platelet Count: 222 10*3/uL (ref 150–400)
RBC: 3.6 MIL/uL — ABNORMAL LOW (ref 4.22–5.81)
RDW: 14 % (ref 11.5–15.5)
WBC Count: 5.9 10*3/uL (ref 4.0–10.5)
nRBC: 0 % (ref 0.0–0.2)

## 2021-03-17 LAB — CMP (CANCER CENTER ONLY)
ALT: 12 U/L (ref 0–44)
AST: 24 U/L (ref 15–41)
Albumin: 3.9 g/dL (ref 3.5–5.0)
Alkaline Phosphatase: 90 U/L (ref 38–126)
Anion gap: 6 (ref 5–15)
BUN: 10 mg/dL (ref 8–23)
CO2: 27 mmol/L (ref 22–32)
Calcium: 9.4 mg/dL (ref 8.9–10.3)
Chloride: 100 mmol/L (ref 98–111)
Creatinine: 0.93 mg/dL (ref 0.61–1.24)
GFR, Estimated: >60 mL/min (ref >60)
Glucose, Bld: 80 mg/dL (ref 70–99)
Potassium: 4.8 mmol/L (ref 3.5–5.1)
Sodium: 133 mmol/L — ABNORMAL LOW (ref 135–145)
Total Bilirubin: 0.4 mg/dL (ref 0.3–1.2)
Total Protein: 6.5 g/dL (ref 6.5–8.1)

## 2021-03-17 LAB — TSH: TSH: 3.431 u[IU]/mL (ref 0.320–4.118)

## 2021-03-17 MED ORDER — HEPARIN SOD (PORK) LOCK FLUSH 100 UNIT/ML IV SOLN: 500.0000 [IU] | Freq: Once | INTRAVENOUS | Status: DC | PRN

## 2021-03-17 MED ORDER — SODIUM CHLORIDE 0.9 % IV SOLN
500.0000 mg/m2 | Freq: Once | INTRAVENOUS | Status: AC
  Administered 2021-03-17: 1000 mg via INTRAVENOUS
  Filled 2021-03-17: qty 40

## 2021-03-17 MED ORDER — SODIUM CHLORIDE 0.9% FLUSH: 10.0000 mL | INTRAVENOUS | Status: DC | PRN

## 2021-03-17 MED ORDER — CYANOCOBALAMIN 1000 MCG/ML IJ SOLN: 1000.0000 ug | Freq: Once | INTRAMUSCULAR | Status: DC

## 2021-03-17 MED ORDER — FUROSEMIDE 20 MG PO TABS: 20.0000 mg | ORAL_TABLET | Freq: Every day | ORAL | 0 refills | Status: DC | PRN

## 2021-03-17 MED ORDER — SODIUM CHLORIDE 0.9% FLUSH
10.0000 mL | Freq: Once | INTRAVENOUS | Status: AC
  Administered 2021-03-17: 10 mL

## 2021-03-17 MED ORDER — SODIUM CHLORIDE 0.9 % IV SOLN: Freq: Once | INTRAVENOUS | Status: AC

## 2021-03-17 MED ORDER — PROCHLORPERAZINE MALEATE 10 MG PO TABS
10.0000 mg | ORAL_TABLET | Freq: Once | ORAL | Status: AC
  Administered 2021-03-17: 10 mg via ORAL
  Filled 2021-03-17: qty 1

## 2021-03-17 MED ORDER — SODIUM CHLORIDE 0.9 % IV SOLN
200.0000 mg | Freq: Once | INTRAVENOUS | Status: AC
  Administered 2021-03-17: 200 mg via INTRAVENOUS
  Filled 2021-03-17: qty 200

## 2021-03-17 NOTE — Patient Instructions (Signed)
[facility] MEDICAL ONCOLOGY  Discharge Instructions: Thank you for choosing [facility] to provide your oncology and hematology care.   If you have a lab appointment with the Cancer Center, please go directly to the Cancer Center and check in at the registration area.   Wear comfortable clothing and clothing appropriate for easy access to any Portacath or PICC line.   We strive to give you quality time with your provider. You may need to reschedule your appointment if you arrive late (15 or more minutes).  Arriving late affects you and other patients whose appointments are after yours.  Also, if you miss three or more appointments without notifying the office, you may be dismissed from the clinic at the provider's discretion.      For prescription refill requests, have your pharmacy contact our office and allow 72 hours for refills to be completed.    Today you received the following chemotherapy and/or immunotherapy agents Pembrolizumab (Keytruda) and Pemetrexed (Alimta)      To help prevent nausea and vomiting after your treatment, we encourage you to take your nausea medication as directed.  BELOW ARE SYMPTOMS THAT SHOULD BE REPORTED IMMEDIATELY: *FEVER GREATER THAN 100.4 F (38 C) OR HIGHER *CHILLS OR SWEATING *NAUSEA AND VOMITING THAT IS NOT CONTROLLED WITH YOUR NAUSEA MEDICATION *UNUSUAL SHORTNESS OF BREATH *UNUSUAL BRUISING OR BLEEDING *URINARY PROBLEMS (pain or burning when urinating, or frequent urination) *BOWEL PROBLEMS (unusual diarrhea, constipation, pain near the anus) TENDERNESS IN MOUTH AND THROAT WITH OR WITHOUT PRESENCE OF ULCERS (sore throat, sores in mouth, or a toothache) UNUSUAL RASH, SWELLING OR PAIN  UNUSUAL VAGINAL DISCHARGE OR ITCHING   Items with * indicate a potential emergency and should be followed up as soon as possible or go to the Emergency Department if any problems should occur.  Please show the CHEMOTHERAPY ALERT CARD or  IMMUNOTHERAPY ALERT CARD at check-in to the Emergency Department and triage nurse.  Should you have questions after your visit or need to cancel or reschedule your appointment, please contact [facility]ICAL [facility]COL[facility]  Dept: [phone]  and follow the prompts.  Office hours are 8:00 a.m. to 4:30 p.m. Monday - Friday. Please note that voicemails left after 4:00 p.m. may not be returned until the following business day.  We are closed weekends and major holidays. You have access to a nurse at all times for urgent questions. Please call the main number to the clinic Dept: [phone] and follow the prompts.   For any non-urgent questions, you may also contact your provider using MyChart. We now offer e-Visits for anyone 18 and older to request care online for non-urgent symptoms. For details visit [email or web address].   Also download the MyChart app! Go to the app store, search "MyChart", open the app, select , and log in with your MyChart username and password.  Due to Covid, a mask is required upon entering the hospital/clinic. If you do not have a mask, one will be given to you upon arrival. For doctor visits, patients may have 1 support person aged 18 or older with them. For treatment visits, patients cannot have anyone with them due to current Covid guidelines and our immunocompromised population.   

## 2021-03-18 ENCOUNTER — Ambulatory Visit: Payer: Self-pay | Admitting: Radiation Oncology

## 2021-03-19 DIAGNOSIS — I700 Atherosclerosis of aorta: Secondary | ICD-10-CM | POA: Insufficient documentation

## 2021-04-06 ENCOUNTER — Inpatient Hospital Stay: Payer: Medicare HMO | Attending: Internal Medicine | Admitting: Internal Medicine

## 2021-04-06 ENCOUNTER — Other Ambulatory Visit: Payer: Medicare HMO

## 2021-04-06 ENCOUNTER — Other Ambulatory Visit: Payer: Self-pay

## 2021-04-06 ENCOUNTER — Inpatient Hospital Stay: Payer: Medicare HMO

## 2021-04-06 VITALS — BP 130/76 | HR 61 | Temp 97.5°F | Resp 16 | Ht 73.0 in | Wt 189.2 lb

## 2021-04-06 DIAGNOSIS — J910 Malignant pleural effusion: Secondary | ICD-10-CM | POA: Insufficient documentation

## 2021-04-06 DIAGNOSIS — C3490 Malignant neoplasm of unspecified part of unspecified bronchus or lung: Secondary | ICD-10-CM

## 2021-04-06 DIAGNOSIS — C3412 Malignant neoplasm of upper lobe, left bronchus or lung: Secondary | ICD-10-CM | POA: Diagnosis not present

## 2021-04-06 DIAGNOSIS — Z79899 Other long term (current) drug therapy: Secondary | ICD-10-CM | POA: Diagnosis not present

## 2021-04-06 DIAGNOSIS — C3492 Malignant neoplasm of unspecified part of left bronchus or lung: Secondary | ICD-10-CM

## 2021-04-06 DIAGNOSIS — C782 Secondary malignant neoplasm of pleura: Secondary | ICD-10-CM | POA: Insufficient documentation

## 2021-04-06 DIAGNOSIS — Z5112 Encounter for antineoplastic immunotherapy: Secondary | ICD-10-CM | POA: Insufficient documentation

## 2021-04-06 DIAGNOSIS — Z5111 Encounter for antineoplastic chemotherapy: Secondary | ICD-10-CM | POA: Diagnosis present

## 2021-04-06 DIAGNOSIS — C7931 Secondary malignant neoplasm of brain: Secondary | ICD-10-CM | POA: Diagnosis not present

## 2021-04-06 DIAGNOSIS — Z95828 Presence of other vascular implants and grafts: Secondary | ICD-10-CM

## 2021-04-06 LAB — CBC WITH DIFFERENTIAL (CANCER CENTER ONLY)
Abs Immature Granulocytes: 0.02 10*3/uL (ref 0.00–0.07)
Basophils Absolute: 0.1 10*3/uL (ref 0.0–0.1)
Basophils Relative: 1 %
Eosinophils Absolute: 0.5 10*3/uL (ref 0.0–0.5)
Eosinophils Relative: 8 %
HCT: 32.9 % — ABNORMAL LOW (ref 39.0–52.0)
Hemoglobin: 11.3 g/dL — ABNORMAL LOW (ref 13.0–17.0)
Immature Granulocytes: 0 %
Lymphocytes Relative: 27 %
Lymphs Abs: 1.5 10*3/uL (ref 0.7–4.0)
MCH: 32.7 pg (ref 26.0–34.0)
MCHC: 34.3 g/dL (ref 30.0–36.0)
MCV: 95.1 fL (ref 80.0–100.0)
Monocytes Absolute: 1 10*3/uL (ref 0.1–1.0)
Monocytes Relative: 18 %
Neutro Abs: 2.6 10*3/uL (ref 1.7–7.7)
Neutrophils Relative %: 46 %
Platelet Count: 239 10*3/uL (ref 150–400)
RBC: 3.46 MIL/uL — ABNORMAL LOW (ref 4.22–5.81)
RDW: 14.5 % (ref 11.5–15.5)
WBC Count: 5.7 10*3/uL (ref 4.0–10.5)
nRBC: 0 % (ref 0.0–0.2)

## 2021-04-06 LAB — CMP (CANCER CENTER ONLY)
ALT: 12 U/L (ref 0–44)
AST: 22 U/L (ref 15–41)
Albumin: 3.8 g/dL (ref 3.5–5.0)
Alkaline Phosphatase: 77 U/L (ref 38–126)
Anion gap: 4 — ABNORMAL LOW (ref 5–15)
BUN: 12 mg/dL (ref 8–23)
CO2: 28 mmol/L (ref 22–32)
Calcium: 9 mg/dL (ref 8.9–10.3)
Chloride: 100 mmol/L (ref 98–111)
Creatinine: 0.9 mg/dL (ref 0.61–1.24)
GFR, Estimated: >60 mL/min (ref >60)
Glucose, Bld: 87 mg/dL (ref 70–99)
Potassium: 4.7 mmol/L (ref 3.5–5.1)
Sodium: 132 mmol/L — ABNORMAL LOW (ref 135–145)
Total Bilirubin: 0.4 mg/dL (ref 0.3–1.2)
Total Protein: 6.5 g/dL (ref 6.5–8.1)

## 2021-04-06 LAB — TSH: TSH: 4.391 u[IU]/mL — ABNORMAL HIGH (ref 0.320–4.118)

## 2021-04-06 MED ORDER — SODIUM CHLORIDE 0.9% FLUSH
10.0000 mL | Freq: Once | INTRAVENOUS | Status: AC
  Administered 2021-04-06: 10 mL

## 2021-04-06 MED ORDER — SODIUM CHLORIDE 0.9% FLUSH
10.0000 mL | INTRAVENOUS | Status: DC | PRN
  Administered 2021-04-06: 10 mL

## 2021-04-06 MED ORDER — HEPARIN SOD (PORK) LOCK FLUSH 100 UNIT/ML IV SOLN
500.0000 [IU] | Freq: Once | INTRAVENOUS | Status: AC | PRN
  Administered 2021-04-06: 500 [IU]

## 2021-04-06 MED ORDER — SODIUM CHLORIDE 0.9 % IV SOLN
200.0000 mg | Freq: Once | INTRAVENOUS | Status: AC
  Administered 2021-04-06: 200 mg via INTRAVENOUS
  Filled 2021-04-06: qty 200

## 2021-04-06 MED ORDER — CYANOCOBALAMIN 1000 MCG/ML IJ SOLN
1000.0000 ug | Freq: Once | INTRAMUSCULAR | Status: AC
  Administered 2021-04-06: 1000 ug via INTRAMUSCULAR
  Filled 2021-04-06: qty 1

## 2021-04-06 MED ORDER — PROCHLORPERAZINE MALEATE 10 MG PO TABS
10.0000 mg | ORAL_TABLET | Freq: Once | ORAL | Status: AC
  Administered 2021-04-06: 10 mg via ORAL
  Filled 2021-04-06: qty 1

## 2021-04-06 MED ORDER — SODIUM CHLORIDE 0.9 % IV SOLN
500.0000 mg/m2 | Freq: Once | INTRAVENOUS | Status: AC
  Administered 2021-04-06: 1000 mg via INTRAVENOUS
  Filled 2021-04-06: qty 40

## 2021-04-06 MED ORDER — SODIUM CHLORIDE 0.9 % IV SOLN: Freq: Once | INTRAVENOUS | Status: AC

## 2021-04-06 NOTE — Patient Instructions (Signed)
[facility]  Discharge Instructions: Thank you for choosing [facility] to provide your oncology and hematology care.   If you have a lab appointment with the Cancer Center, please go directly to the Cancer Center and check in at the registration area.   Wear comfortable clothing and clothing appropriate for easy access to any Portacath or PICC line.   We strive to give you quality time with your provider. You may need to reschedule your appointment if you arrive late (15 or more minutes).  Arriving late affects you and other patients whose appointments are after yours.  Also, if you miss three or more appointments without notifying the office, you may be dismissed from the clinic at the provider's discretion.      For prescription refill requests, have your pharmacy contact our office and allow 72 hours for refills to be completed.    Today you received the following chemotherapy and/or immunotherapy agents Pembrolizumab (Keytruda) and Pemetrexed (Alimta)      To help prevent nausea and vomiting after your treatment, we encourage you to take your nausea medication as directed.  BELOW ARE SYMPTOMS THAT SHOULD BE REPORTED IMMEDIATELY: *FEVER GREATER THAN 100.4 F (38 C) OR HIGHER *CHILLS OR SWEATING *NAUSEA AND VOMITING THAT IS NOT CONTROLLED WITH YOUR NAUSEA MEDICATION *UNUSUAL SHORTNESS OF BREATH *UNUSUAL BRUISING OR BLEEDING *URINARY PROBLEMS (pain or burning when urinating, or frequent urination) *BOWEL PROBLEMS (unusual diarrhea, constipation, pain near the anus) TENDERNESS IN MOUTH AND THROAT WITH OR WITHOUT PRESENCE OF ULCERS (sore throat, sores in mouth, or a toothache) UNUSUAL RASH, SWELLING OR PAIN  UNUSUAL VAGINAL DISCHARGE OR ITCHING   Items with * indicate a potential emergency and should be followed up as soon as possible or go to the Emergency Department if any problems should occur.  Please show the CHEMOTHERAPY ALERT CARD or  IMMUNOTHERAPY ALERT CARD at check-in to the Emergency Department and triage nurse.  Should you have questions after your visit or need to cancel or reschedule your appointment, please contact [facility]  Dept: [phone]  and follow the prompts.  Office hours are 8:00 a.m. to 4:30 p.m. Monday - Friday. Please note that voicemails left after 4:00 p.m. may not be returned until the following business day.  We are closed weekends and major holidays. You have access to a nurse at all times for urgent questions. Please call the main number to the clinic Dept: [phone] and follow the prompts.   For any non-urgent questions, you may also contact your provider using MyChart. We now offer e-Visits for anyone 18 and older to request care online for non-urgent symptoms. For details visit [email or web address].   Also download the MyChart app! Go to the app store, search "MyChart", open the app, select [facility], and log in with your MyChart username and password.  Due to Covid, a mask is required upon entering the hospital/clinic. If you do not have a mask, one will be given to you upon arrival. For doctor visits, patients may have 1 support person aged 18 or older with them. For treatment visits, patients cannot have anyone with them due to current Covid guidelines and our immunocompromised population.   

## 2021-04-06 NOTE — Progress Notes (Signed)
?    [facility] ?[phone]   [phone] ? ?OFFICE PROGRESS NOTE ? ?[name], MD ?[address] ?[place] [address] ? ?DIAGNOSIS: Stage IV (T1b, N2, M1a) non-small cell lung cancer, adenocarcinoma diagnosed in July 2022 and presented with left upper lobe nodule in addition to AP window lymphadenopathy and left-sided malignant pleural effusion as well as pleural metastatic disease.  The patient also has solitary left temporal brain metastasis. ? ?Biomarker Findings ?Microsatellite status - MS-Stable ?Tumor Mutational Burden - 4 Muts/Mb ?Genomic Findings ?For a complete list of the genes assayed, please refer to the Appendix. ?PIK3CA E545K ?PTEN splice site 810-1B>P - subclonal? ?DOT1L S911L - subclonal? ?RAD21 S271* ?RB1 loss exons 3-23 ?TP53 N36f*34 ?8 Disease relevant genes with no reportable ?alterations: ALK, BRAF, EGFR, ERBB2, KRAS, MET, RET, ?ROS1 ? ?PRIOR THERAPY: None ? ?CURRENT THERAPY: Systemic chemotherapy with carboplatin for AUC of 5, Alimta 500 Mg/M2 and Keytruda 200 Mg IV every 3 weeks.  First dose August 13, 2020.  Status post 11 cycles.  Starting from cycle #5 he is on maintenance treatment with Alimta and Keytruda every 3 weeks. ? ?INTERVAL HISTORY: ?JMazen Marcin89y.o. male returns to the clinic today for follow-up visit.  He was accompanied by his wife.  The patient is feeling fine today with no concerning complaints except for swelling in the lower extremities.  He was given 5 days of Lasix but there was no improvement in the swelling and there is no pitting edema.  He has no chest pain, shortness of breath, cough or hemoptysis.  He denied having any fever or chills.  He has no nausea, vomiting, diarrhea or constipation.  He has no headache or visual changes.  He was recently found to have enlarging solitary brain metastasis but it was felt by Dr. MLisbeth Renshawto be inflammatory process and he is scheduled to have another MRI of the brain next month.   The patient is here today for evaluation before starting cycle #12 of his treatment. ? ?MEDICAL HISTORY: ?Past Medical History:  ?Diagnosis Date  ? Lung cancer ([place]   ? Lung cancer metastatic to brain ([facility]   ? ? ?ALLERGIES:  has no active allergies. ? ?MEDICATIONS:  ?Current Outpatient Medications  ?Medication Sig Dispense Refill  ? aspirin 81 MG EC tablet Take by mouth.    ? Bacillus Coagulans-Inulin (PROBIOTIC) 1-250 BILLION-MG CAPS Take 1 tablet by mouth daily.    ? bisacodyl (DULCOLAX) 5 MG EC tablet Take 5 mg by mouth 3 (three) times daily as needed for moderate constipation.    ? coal tar-salicylic acid 2 % shampoo Apply topically daily as needed for itching.    ? DULoxetine (CYMBALTA) 30 MG capsule Take 30 mg by mouth daily.    ? fluocinonide cream (LIDEX) 01.02% Apply 1 application topically 2 (two) times daily.    ? folic acid (FOLVITE) 1 MG tablet TAKE 1 TABLET(1 MG) BY MOUTH DAILY 30 tablet 4  ? furosemide (LASIX) 20 MG tablet Take 1 tablet (20 mg total) by mouth daily as needed. 5 tablet 0  ? Glucosamine-Chondroitin 250-200 MG TABS Take 1 tablet by mouth daily. (Patient not taking: Reported on 03/16/2021)    ? hydrocortisone cream 1 % Apply 1 application topically 2 (two) times daily. 453.6 g 0  ? ketoconazole (NIZORAL) 2 % cream Apply 1 application topically daily.    ? Krill Oil (OMEGA-3) 500 MG CAPS Take by mouth.    ? lidocaine-prilocaine (EMLA) cream  Apply 1 application topically as needed. 30 g 1  ? loratadine (CLARITIN) 10 MG tablet Take by mouth.    ? Magnesium 250 MG TABS Take by mouth.    ? omeprazole (PRILOSEC) 40 MG capsule Take 40 mg by mouth daily.    ? ondansetron (ZOFRAN-ODT) 4 MG disintegrating tablet Take 1 tablet (4 mg total) by mouth every 8 (eight) hours as needed. (Patient not taking: Reported on 02/23/2021) 20 tablet 0  ? prochlorperazine (COMPAZINE) 10 MG tablet TAKE 1 TABLET(10 MG) BY MOUTH EVERY 6 HOURS AS NEEDED FOR NAUSEA OR VOMITING 30 tablet 0  ? triamcinolone cream  (KENALOG) 0.1 % Apply 1 application topically daily as needed. 453 g 6  ? ?No current facility-administered medications for this visit.  ? ? ?SURGICAL HISTORY:  ?Past Surgical History:  ?Procedure Laterality Date  ? IR IMAGING GUIDED PORT INSERTION  11/24/2020  ? KNEE SURGERY    ? orthoscopic    ? TONSILLECTOMY AND ADENOIDECTOMY    ? ? ?REVIEW OF SYSTEMS:  A comprehensive review of systems was negative except for: Constitutional: positive for fatigue ?Respiratory: positive for dyspnea on exertion  ? ?PHYSICAL EXAMINATION: General appearance: alert, cooperative, fatigued, and no distress ?Head: Normocephalic, without obvious abnormality, atraumatic ?Neck: no adenopathy, no JVD, supple, symmetrical, trachea midline, and thyroid not enlarged, symmetric, no tenderness/mass/nodules ?Lymph nodes: Cervical, supraclavicular, and axillary nodes normal. ?Resp: clear to auscultation bilaterally ?Back: symmetric, no curvature. ROM normal. No CVA tenderness. ?Cardio: regular rate and rhythm, S1, S2 normal, no murmur, click, rub or gallop ?GI: soft, non-tender; bowel sounds normal; no masses,  no organomegaly ?Extremities: extremities normal, atraumatic, no cyanosis or edema ? ?ECOG PERFORMANCE STATUS: 1 - Symptomatic but completely ambulatory ? ?Blood pressure 130/76, pulse 61, temperature (!) 97.5 ?F (36.4 ?C), temperature source Tympanic, resp. rate 16, height '6\' 1"'  (1.854 m), weight 189 lb 3 oz (85.8 kg), SpO2 99 %. ? ?LABORATORY DATA: ?Lab Results  ?Component Value Date  ? WBC 5.7 04/06/2021  ? HGB 11.3 (L) 04/06/2021  ? HCT 32.9 (L) 04/06/2021  ? MCV 95.1 04/06/2021  ? PLT 239 04/06/2021  ? ? ?  Chemistry   ?   ?Component Value Date/Time  ? NA 133 (L) 03/17/2021 1126  ? K 4.8 03/17/2021 1126  ? CL 100 03/17/2021 1126  ? CO2 27 03/17/2021 1126  ? BUN 10 03/17/2021 1126  ? CREATININE 0.93 03/17/2021 1126  ?    ?Component Value Date/Time  ? CALCIUM 9.4 03/17/2021 1126  ? ALKPHOS 90 03/17/2021 1126  ? AST 24 03/17/2021 1126  ?  ALT 12 03/17/2021 1126  ? BILITOT 0.4 03/17/2021 1126  ?  ? ? ? ?RADIOGRAPHIC STUDIES: ?MR Brain W Wo Contrast ? ?Result Date: 03/12/2021 ?CLINICAL DATA:  81 year old male with metastatic lung cancer. Solitary treated left temporal lobe metastasis. Restaging. EXAM: MRI HEAD WITHOUT AND WITH CONTRAST TECHNIQUE: Multiplanar, multiecho pulse sequences of the brain and surrounding structures were obtained without and with intravenous contrast. CONTRAST:  80m MULTIHANCE GADOBENATE DIMEGLUMINE 529 MG/ML IV SOLN COMPARISON:  12/11/2020 and earlier. FINDINGS: Brain: Interval enlargement of the small treated left periventricular temporal lobe enhancing lesion from only 5-6 mm in December now up to 13-14 mm. The lesion is larger and more solidly enhancing than at the initial presentation in August. See series 11, image 66 and series 14, image 30. Confluent new T2 and FLAIR hyperintensity about the lesion also suggesting regional vasogenic edema (series 8, image 25). The lesion is mostly T2  hypointense, and dark on ADC but only faintly increased on trace diffusion. The involved left temporal lobe is now mildly expanded (series 10, image 31). No other abnormal enhancement or new brain metastasis identified. No dural thickening. No superimposed No restricted diffusion to suggest acute infarction. No midline shift, ventriculomegaly, extra-axial collection or acute intracranial hemorrhage. Cervicomedullary junction and pituitary are within normal limits. Outside of the left temporal lobe gray and white matter signal is stable. No cortical encephalomalacia or definite chronic cerebral blood products. Vascular: Major intracranial vascular flow voids are stable. The major dural venous structures appear to be enhancing and patent. Skull and upper cervical spine: Partially visible cervical spine degeneration. Visualized bone marrow signal is within normal limits. Sinuses/Orbits: Stable, negative. Other: Trace left mastoid air cell fluid  is stable. Visible internal auditory structures appear normal. Negative visible scalp and face. IMPRESSION: Enlarged and almost tripled size of the solitary treated left temporal lobe metastasis since Decemb

## 2021-04-10 ENCOUNTER — Other Ambulatory Visit: Payer: Self-pay | Admitting: Radiation Therapy

## 2021-04-10 ENCOUNTER — Telehealth: Payer: Self-pay | Admitting: Radiation Therapy

## 2021-04-10 DIAGNOSIS — C7931 Secondary malignant neoplasm of brain: Secondary | ICD-10-CM

## 2021-04-10 NOTE — Telephone Encounter (Signed)
Spoke with the patient's wife about his upcoming brain MRI and telephone follow-up with Bryson Ha in May. An order was placed for the GI staff to access his port the day of his scan, per her request.  ? ? ?Mont Dutton R.T.(R)(T) ?Radiation Special Procedures Navigator  ?

## 2021-04-22 NOTE — Progress Notes (Signed)
[facility] ?OFFICE PROGRESS NOTE ? ?[name], MD ?[address] ?[place] [address] ? ?DIAGNOSIS: Stage IV (T1b, N2, M1 a) non-small cell lung cancer, adenocarcinoma presented with left upper lobe lung nodule in addition to mediastinal lymphadenopathy and pleural-based metastasis as well as malignant left pleural effusion diagnosed in July 2022. The patient also has solitary left temporal brain metastasis. ?  ?Molecular Studies: Biomarker Findings ?Microsatellite status - MS-Stable ?Tumor Mutational Burden - 4 Muts/Mb ?Genomic Findings ?For a complete list of the genes assayed, please refer to the Appendix. ?PIK3CA E545K ?PTEN splice site 035-5H>R - subclonal? ?DOT1L S911L - subclonal? ?RAD21 S271* ?RB1 loss exons 3-23 ?TP53 N337f*34 ?8 Disease relevant genes with no reportable ?alterations: ALK, BRAF, EGFR, ERBB2, KRAS, MET, RET, ?ROS1 ? ?PRIOR THERAPY: SRS to the metastatic brain lesion under the care of Dr. MLisbeth Renshaw Completed on 09/03/20. ? ?CURRENT THERAPY:  Systemic chemotherapy with carboplatin for AUC of 5, Alimta 500 Mg/M2 and Keytruda 200 Mg IV every 3 weeks.  First dose on 08/13/20.  Status post 12 cycles.  Starting from cycle #5, the patient started maintenance treatment with alimta and [place]  ? ?INTERVAL HISTORY: ?Peter Wagley89y.o. male returns to the clinic today for a follow-up visit accompanied by his wife.  The patient is feeling fairly well today without any concerning complaints except for persistent lower/upper extremity swelling.  The patient was previously prescribed Lasix without any improvement in his swelling. He also has been elevating his legs and using compression stockings. There is no improvement with elevating his lower extremities. He denies extremity pain. He reports that he drinks approximately 30 ounces of water per day. He denies history of heart disease or renal disease.  ? ?Otherwise, the patient is currently undergoing treatment with maintenance  Alimta and Keytruda.  He is tolerating this fairly well except for poor appetite 5 to 6 days after treatment.  His weight is stable.  He denies any fever, chills, or night sweats.  He has some stable dyspnea on exertion. He reports ever since having COVID-19 in October 2022, he has been told that he has permanent lung damage and that his breathing has never been the same.  Overall, his breathing presently is better than it was several months ago but it is not at his baseline.  He reports prior to COVID he was walking to 3 miles before feeling winded.  He now reports that he can feel winded after walking about 10 minutes.  Denies any significant cough.  Denies any chest pain or hemoptysis.  He has some nausea following treatment but the patient does not like to take his antiemetic due to being worried he will cause some constipation.  He uses Dulcolax for constipation.  He denies any vomiting.  He denies any diarrhea.  The patient is being followed by radiation oncology for his history of metastatic disease to the brain and they are monitoring an area on an upcoming short interval brain MRI which is scheduled for 05/15/2021.  The patient denies any headache or visual changes. He also mentions that sometimes he has some pain in his buttock bilaterally  when sitting too long. He has an orthopedic provider due to degenerative changes in his spine for which he previously received injections. The patient recently had a restaging CT scan performed.  He is here today for evaluation and repeat blood work before starting cycle #13. ? ? ? ?MEDICAL HISTORY: ?Past Medical History:  ?Diagnosis Date  ? Lung cancer (  [place])   ? Lung cancer metastatic to brain [facility])   ? ? ?ALLERGIES:  has No Known Allergies. ? ?MEDICATIONS:  ?Current Outpatient Medications  ?Medication Sig Dispense Refill  ? aspirin 81 MG EC tablet Take by mouth.    ? Bacillus Coagulans-Inulin (PROBIOTIC) 1-250 BILLION-MG CAPS Take 1 tablet by mouth daily.    ?  bisacodyl (DULCOLAX) 5 MG EC tablet Take 5 mg by mouth 3 (three) times daily as needed for moderate constipation.    ? Cholecalciferol (D3 PO) Take 1 tablet by mouth daily.    ? coal tar-salicylic acid 2 % shampoo Apply topically daily as needed for itching.    ? DULoxetine (CYMBALTA) 30 MG capsule Take 30 mg by mouth daily.    ? fluocinonide cream (LIDEX) 2.86 % Apply 1 application topically 2 (two) times daily.    ? folic acid (FOLVITE) 1 MG tablet TAKE 1 TABLET(1 MG) BY MOUTH DAILY 30 tablet 4  ? hydrocortisone cream 1 % Apply 1 application topically 2 (two) times daily. 453.6 g 0  ? ketoconazole (NIZORAL) 2 % cream Apply 1 application topically daily.    ? Krill Oil (OMEGA-3) 500 MG CAPS Take by mouth.    ? lidocaine-prilocaine (EMLA) cream Apply 1 application topically as needed. 30 g 1  ? loratadine (CLARITIN) 10 MG tablet Take by mouth.    ? Magnesium 250 MG TABS Take by mouth.    ? Niacin (VITAMIN B-3 PO) Take 1 tablet by mouth daily.    ? omeprazole (PRILOSEC) 40 MG capsule Take 40 mg by mouth daily.    ? ondansetron (ZOFRAN-ODT) 4 MG disintegrating tablet Take 1 tablet (4 mg total) by mouth every 8 (eight) hours as needed. 20 tablet 0  ? prochlorperazine (COMPAZINE) 10 MG tablet TAKE 1 TABLET(10 MG) BY MOUTH EVERY 6 HOURS AS NEEDED FOR NAUSEA OR VOMITING 30 tablet 0  ? triamcinolone cream (KENALOG) 0.1 % Apply 1 application topically daily as needed. 453 g 6  ? ?No current facility-administered medications for this visit.  ? ?Facility-Administered Medications Ordered in Other Visits  ?Medication Dose Route Frequency Provider Last Rate Last Admin  ? sodium chloride flush (NS) 0.9 % injection 10 mL  10 mL Intracatheter PRN Curt Bears, MD   10 mL at 04/27/21 1307  ? ? ?SURGICAL HISTORY:  ?Past Surgical History:  ?Procedure Laterality Date  ? IR IMAGING GUIDED PORT INSERTION  11/24/2020  ? KNEE SURGERY    ? orthoscopic    ? TONSILLECTOMY AND ADENOIDECTOMY    ? ? ?REVIEW OF SYSTEMS:   ?Constitutional:  Positive for fatigue a few days following treatment. Positive for decreased appetite a few days following treatment. Negative for chills and fever.  ?HENT:  Negative for mouth sores, nosebleeds, sore throat and trouble swallowing.   ?Eyes: Negative for eye problems and icterus.  ?Respiratory: Positive for baseline dyspnea on exertion. Negative for cough, hemoptysis, and wheezing.   ?Cardiovascular: Positive for mild bilateral lower extremity swelling. Positive for intermittent mild left chest discomfort for which he attributes to indigestion.  ?Gastrointestinal: Positive for mild nausea following treatment. Positive for mild constipation. Negative for abdominal pain, diarrhea, and vomiting.  ?Genitourinary: Negative for bladder incontinence, difficulty urinating, dysuria, frequency and hematuria.   ?Musculoskeletal: Positive for occasional bilateral buttock pain when seated. Negative for back pain, gait problem, neck pain and neck stiffness.  ?Skin: Negative for itching and rash.  ?Neurological: Negative for dizziness, extremity weakness, gait problem, headaches, light-headedness and seizures.  ?Hematological:  Negative for adenopathy. Does not bruise/bleed easily.  ?Psychiatric/Behavioral: Negative for confusion, depression and sleep disturbance. The patient is not nervous/anxious.   ?  ? ? ?PHYSICAL EXAMINATION:  ?Blood pressure (!) 142/81, pulse 63, temperature (!) 97.5 ?F (36.4 ?C), weight 189 lb 4.8 oz (85.9 kg), SpO2 98 %. ? ?ECOG PERFORMANCE STATUS: 1 ? ?Physical Exam  ?Constitutional: Oriented to person, place, and time and well-developed, well-nourished, and in no distress.  ?HENT:  ?Head: Normocephalic and atraumatic.  ?Mouth/Throat: Oropharynx is clear and moist. No oropharyngeal exudate.  ?Eyes: Conjunctivae are normal. Right eye exhibits no discharge. Left eye exhibits no discharge. No scleral icterus.  ?Neck: Normal range of motion. Neck supple.  ?Cardiovascular: Normal rate, regular rhythm, normal  heart sounds and intact distal pulses.   ?Pulmonary/Chest: Effort normal and breath sounds normal. No respiratory distress. No wheezes. No rales.  ?Abdominal: Soft. Bowel sounds are normal. Exhibits no distension and n

## 2021-04-23 ENCOUNTER — Ambulatory Visit (HOSPITAL_COMMUNITY)
Admission: RE | Admit: 2021-04-23 | Discharge: 2021-04-23 | Disposition: A | Discharge status: Home / Self Care | Payer: Medicare HMO | Source: Ambulatory Visit | Attending: Internal Medicine | Admitting: Internal Medicine

## 2021-04-23 DIAGNOSIS — C3490 Malignant neoplasm of unspecified part of unspecified bronchus or lung: Secondary | ICD-10-CM | POA: Diagnosis present

## 2021-04-23 IMAGING — CT CT CHEST W/ CM
2 of 4 series · 13 of 36 positions shown, 16 images · IV contrast (APPLIED)
Comparison: [DATE]

CLINICAL DATA: Non-small-cell lung cancer. Diagnosed in [DATE]. Brain metastasis. Radiation therapy to brain complete. On
immunotherapy. On chemotherapy. Left chest pain. Shortness of breath
on exertion. * Tracking Code: BO *

EXAM:
CT CHEST, ABDOMEN, AND PELVIS WITH CONTRAST
TECHNIQUE: Multidetector CT imaging of the chest, abdomen and pelvis was
performed following the standard protocol during bolus
administration of intravenous contrast.

[Series 504: cap with · axial · 0.94mm/px · z∈[+1090,+1680]mm · 10 of 142 slices shown, 13 images]
[im 12/142  mediastinal]
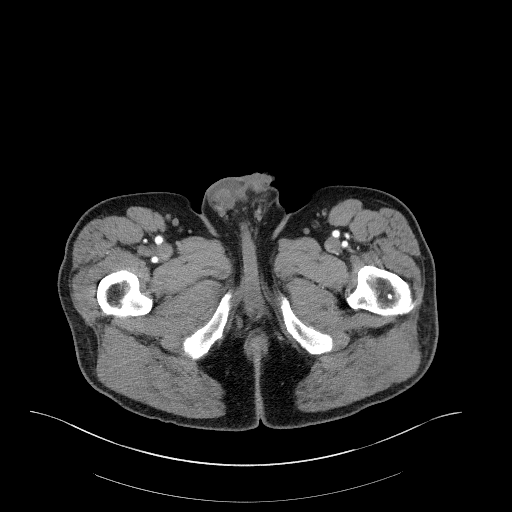
[im 12/142  lung]
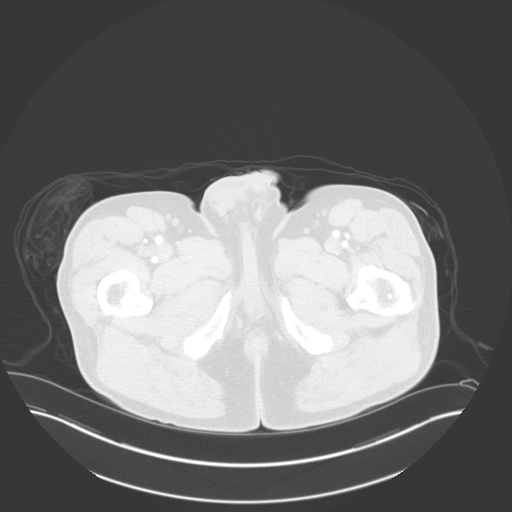
[im 24/142  lung]
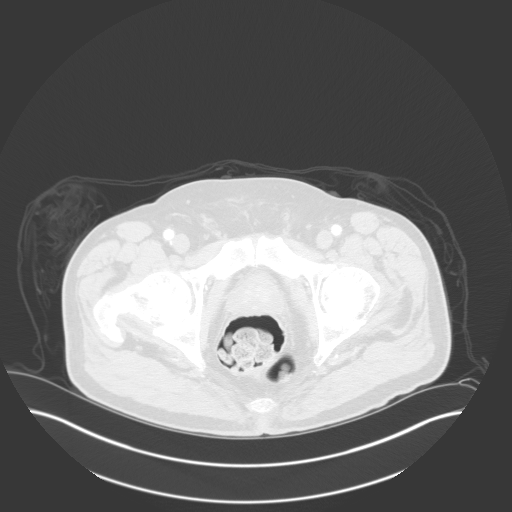
[im 36/142  lung]
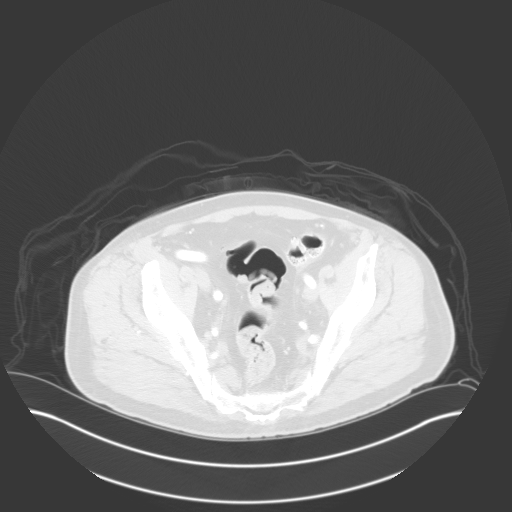
[im 48/142  lung]
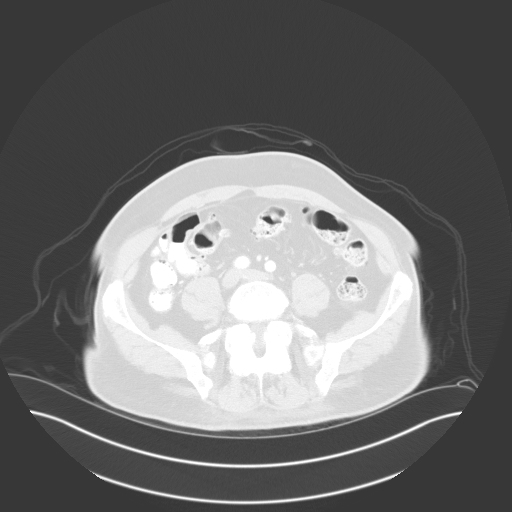
[im 59/142  mediastinal]
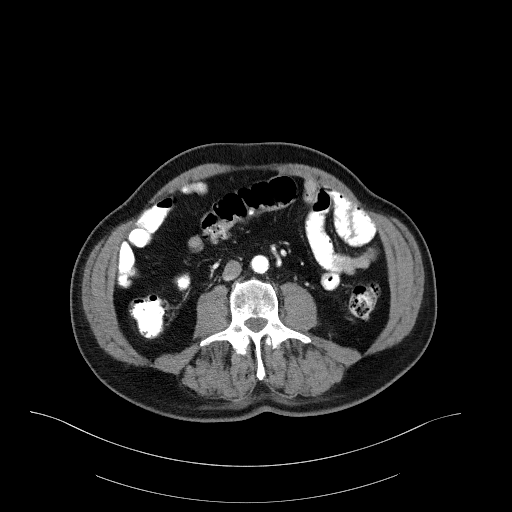
[im 59/142  lung]
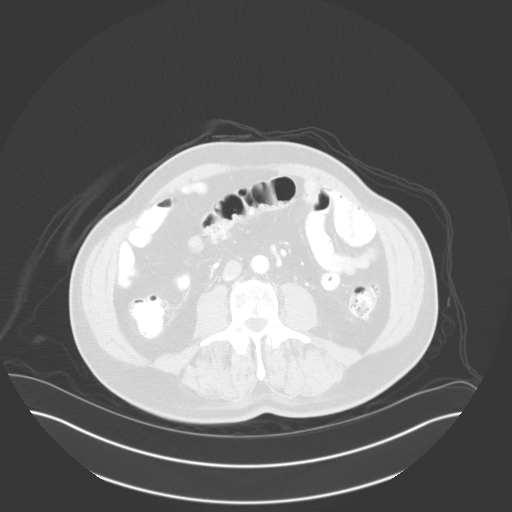
[im 83/142  lung]
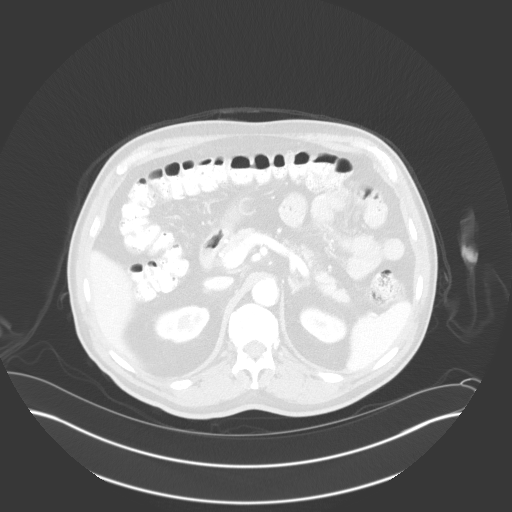
[im 95/142  lung]
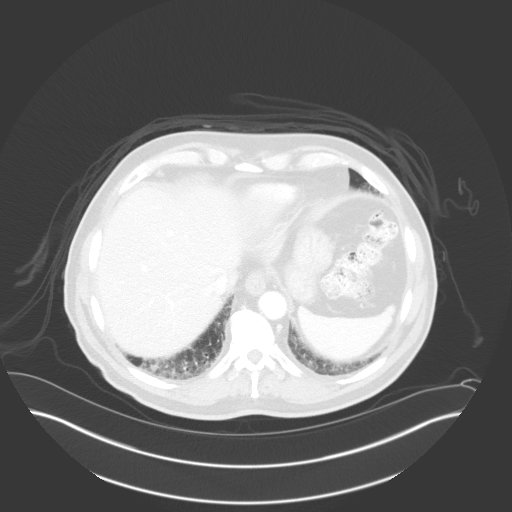
[im 106/142  lung]
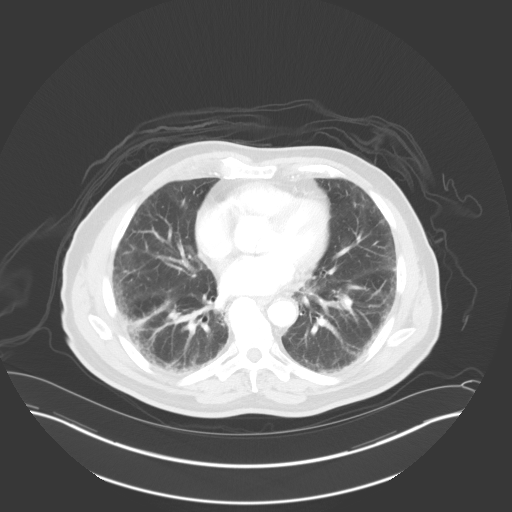
[im 118/142  mediastinal]
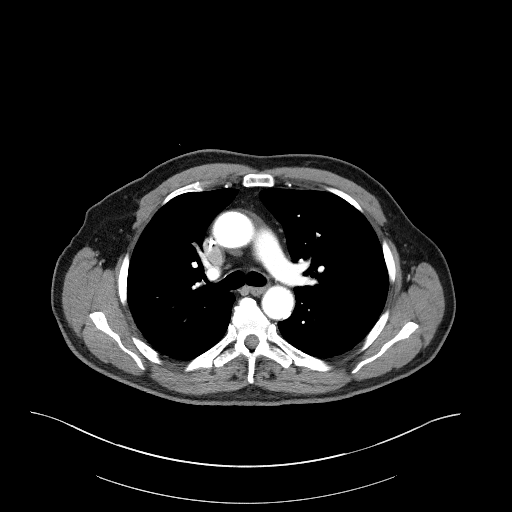
[im 118/142  lung]
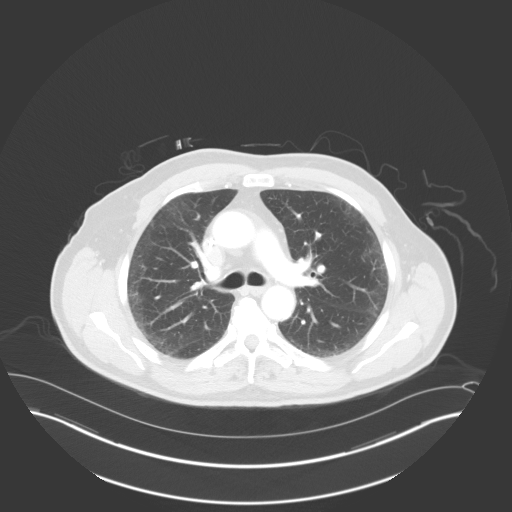
[im 130/142  lung]
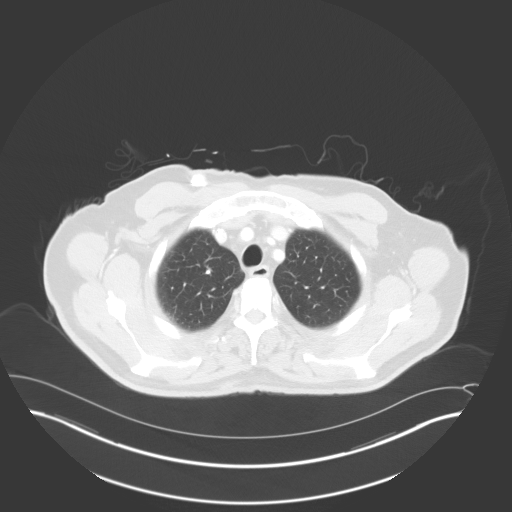

[Series 506: coronals · coronal · 0.85mm/px · 3 of 180 slices shown]
[im 36/180  lung]
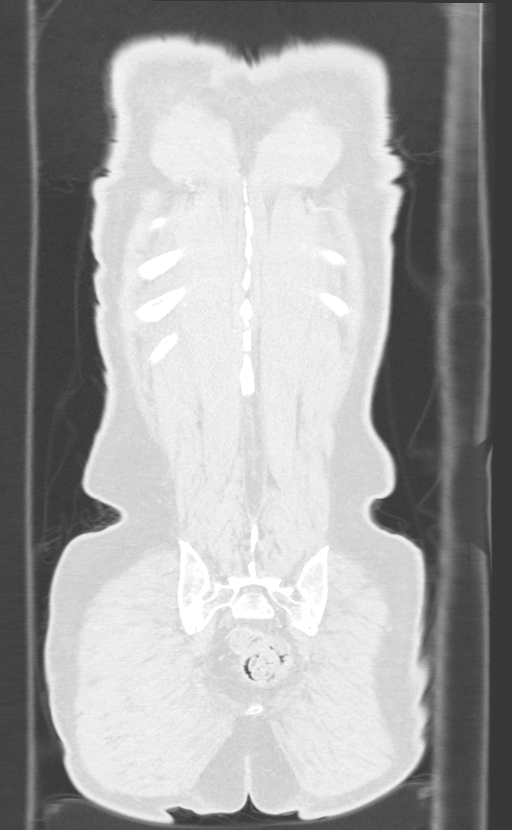
[im 72/180  lung]
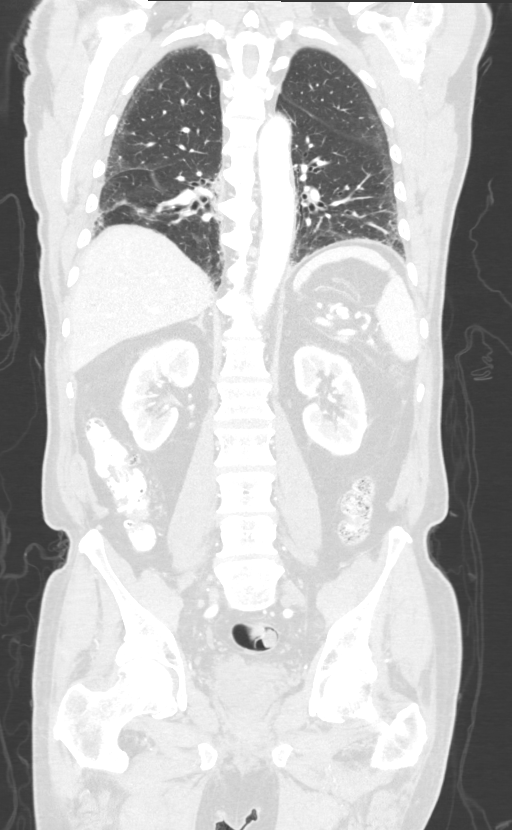
[im 108/180  lung]
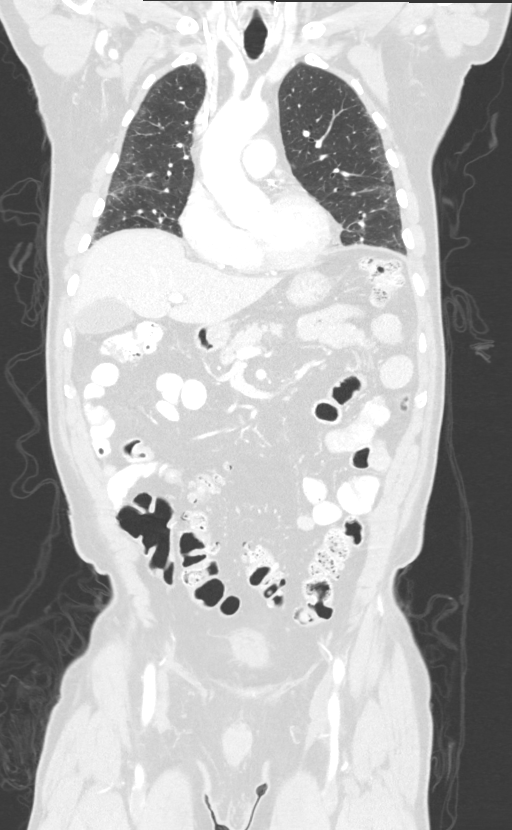

[13 of 36 positions shown; findings below may reference images not displayed]

RADIATION DOSE REDUCTION: This exam was performed according to the
departmental dose-optimization program which includes automated
exposure control, adjustment of the mA and/or kV according to
patient size and/or use of iterative reconstruction technique.

CONTRAST:  100mL OMNIPAQUE IOHEXOL 300 MG/ML  SOLN
FINDINGS: CT CHEST FINDINGS

Cardiovascular: Right Port-A-Cath tip high right atrium. Aortic
atherosclerosis. Tortuous thoracic aorta. Normal heart size, without
pericardial effusion. Lad coronary artery calcification. No central
pulmonary embolism, on this non-dedicated study.

Mediastinum/Nodes: No supraclavicular adenopathy. Small nodes within
the azygoesophageal recess are similar and not pathologic by size
criteria. No hilar adenopathy.

Lungs/Pleura: No pleural fluid.

Anterior left upper lobe pulmonary nodule measures 1.3 x 1.0 cm on
75/4, increased from 6 mm on the prior exam.

Subpleural lymph nodes along the left major fissure are unchanged
including at up to 7 mm.

Similar subpleural ground-glass, reticulation, and traction
bronchiolectasis, basilar predominant.

Musculoskeletal: No acute osseous abnormality.

CT ABDOMEN PELVIS FINDINGS

Hepatobiliary: Normal liver. Normal gallbladder, without biliary
ductal dilatation.

Pancreas: Normal, without mass or ductal dilatation.

Spleen: Normal in size, without focal abnormality.

Adrenals/Urinary Tract: Normal adrenal glands. Interpolar left renal
1.9 cm cyst. Normal right kidney. The bladder wall appears mildly
thickened with pericystic edema, similar.

Stomach/Bowel: Normal stomach, without wall thickening. Scattered
colonic diverticula. Normal terminal ileum and appendix. Normal
small bowel.

Vascular/Lymphatic: Aortic atherosclerosis. No abdominopelvic
adenopathy.

Reproductive: Normal prostate.

Other: No significant free fluid. No evidence of omental or
peritoneal disease.

Musculoskeletal: Bilateral L5 pars defects. Grade 2 L5-S1
anterolisthesis with degenerative disc disease at this level.
IMPRESSION: 1. Significant enlargement of anteromedial left upper lobe pulmonary
nodule, most consistent with metachronous primary or isolated
pulmonary metastasis.
2. No thoracic adenopathy.
3. No acute process or evidence of metastatic disease in the abdomen
or pelvis.
4. Interstitial lung disease, again favoring nonspecific
interstitial pneumonitis.
5. Coronary artery atherosclerosis. Aortic Atherosclerosis
([I4]-[I4]).
6. Similar mild bladder wall thickening and pericystic edema,
possibly representing chronic cystitis or a component of outlet
obstruction. Correlate with symptoms and possibly urinalysis.

## 2021-04-23 IMAGING — CT CT ABD-PELV W/ CM
2 of 5 series · 13 of 36 positions shown, 16 images · IV contrast (APPLIED)
Comparison: [DATE]

CLINICAL DATA: Non-small-cell lung cancer. Diagnosed in [DATE]. Brain metastasis. Radiation therapy to brain complete. On
immunotherapy. On chemotherapy. Left chest pain. Shortness of breath
on exertion. * Tracking Code: BO *

EXAM:
CT CHEST, ABDOMEN, AND PELVIS WITH CONTRAST
TECHNIQUE: Multidetector CT imaging of the chest, abdomen and pelvis was
performed following the standard protocol during bolus
administration of intravenous contrast.

[Series 2: cap with · axial · 0.94mm/px · z∈[+1095,+1675]mm · 10 of 142 slices shown, 13 images]
[im 13/142  mediastinal]
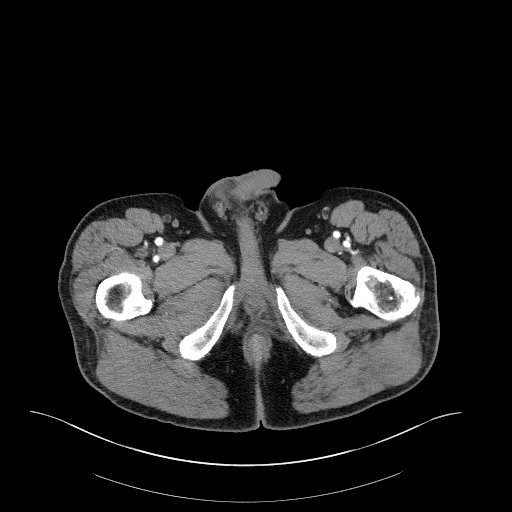
[im 13/142  lung]
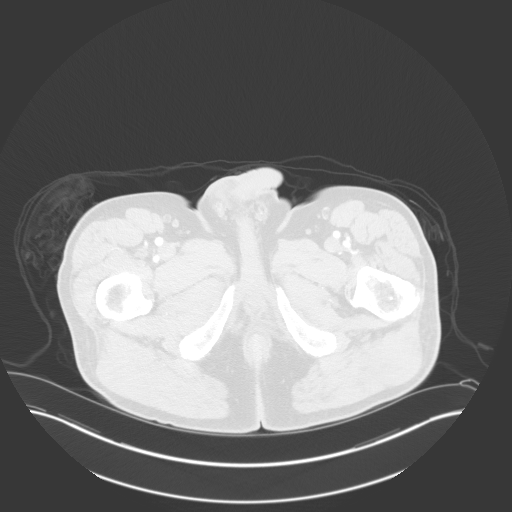
[im 26/142  lung]
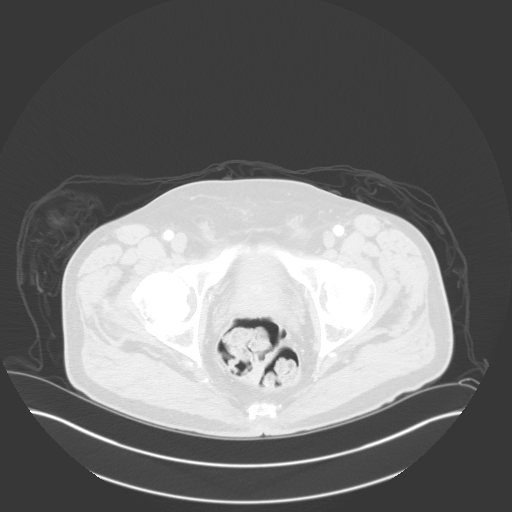
[im 39/142  lung]
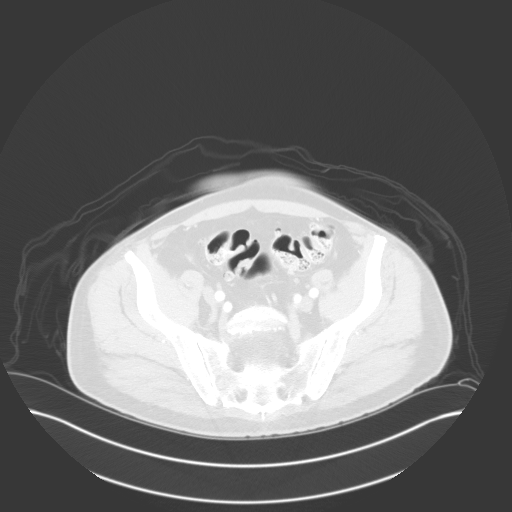
[im 52/142  lung]
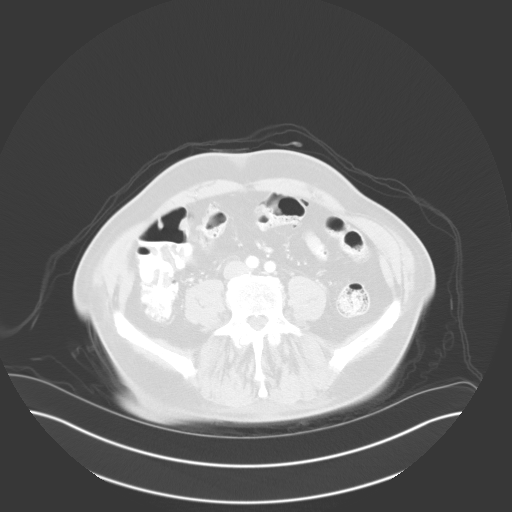
[im 65/142  mediastinal]
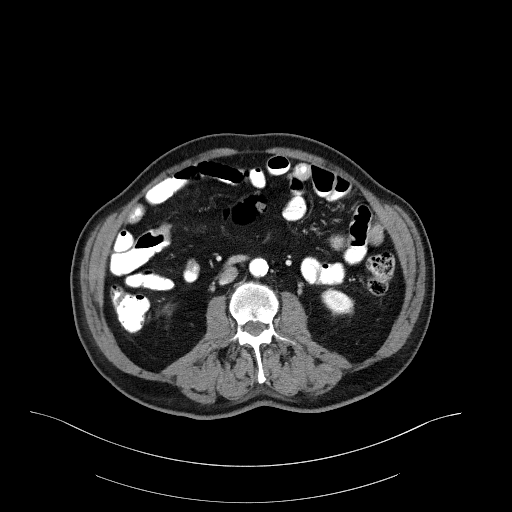
[im 65/142  lung]
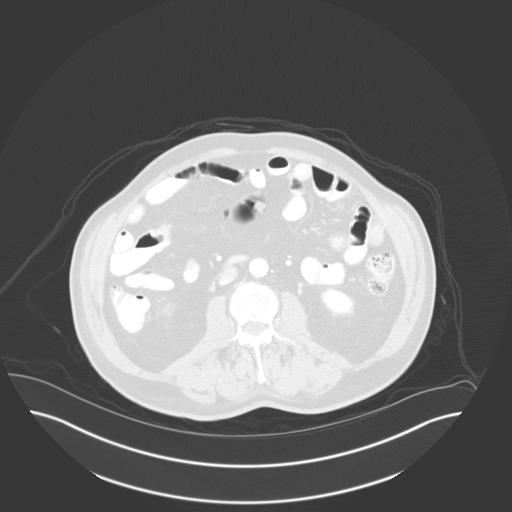
[im 77/142  lung]
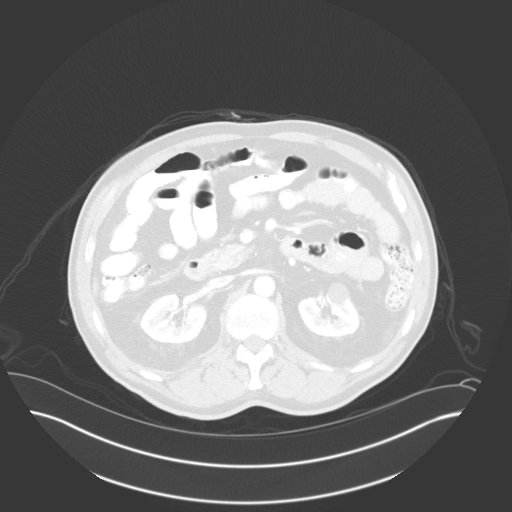
[im 90/142  lung]
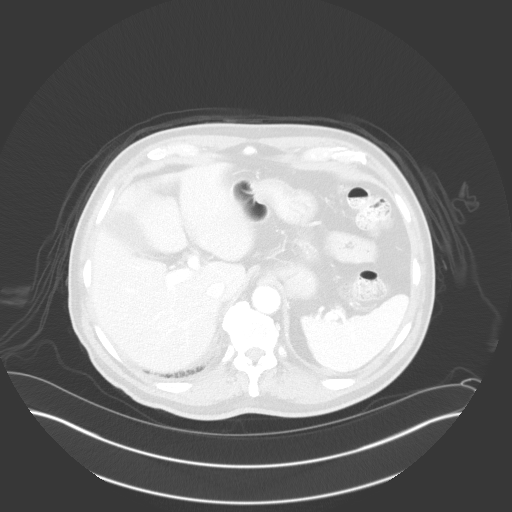
[im 103/142  lung]
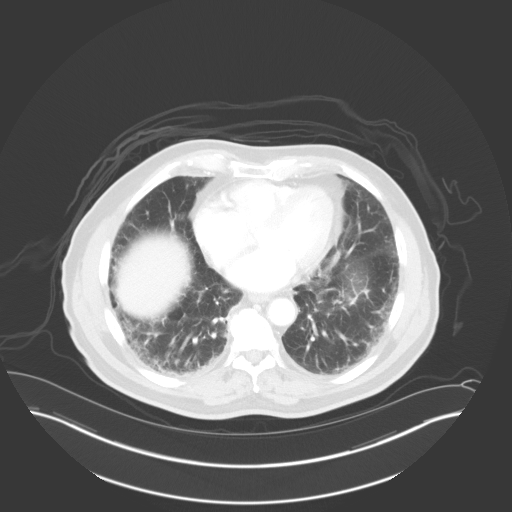
[im 116/142  mediastinal]
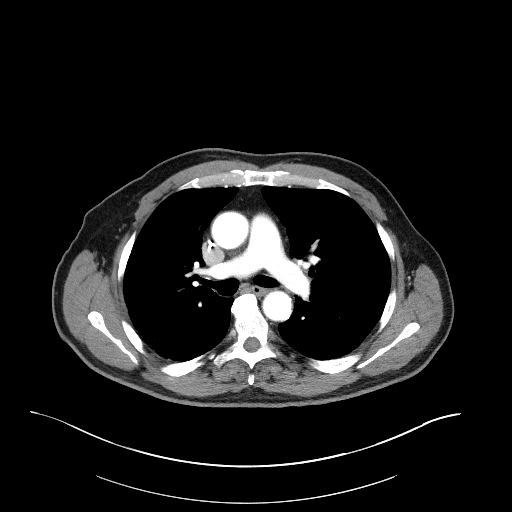
[im 116/142  lung]
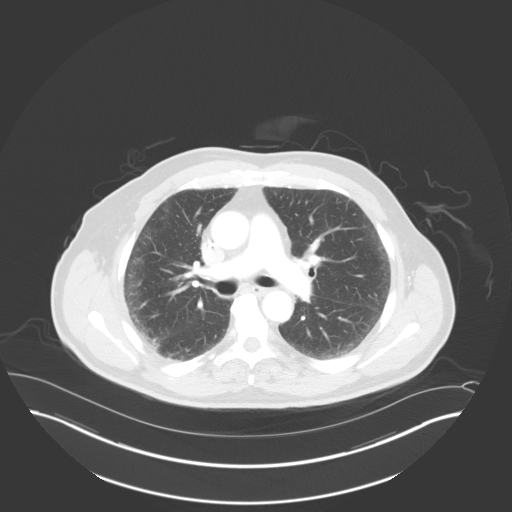
[im 129/142  lung]
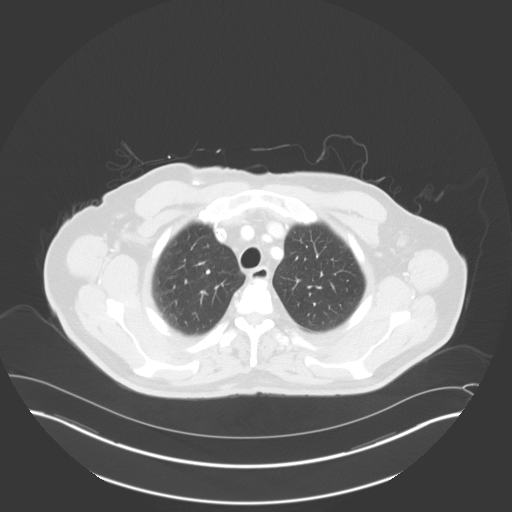

[Series 5: coronals · coronal · 0.85mm/px · 3 of 180 slices shown]
[im 36/180  lung]
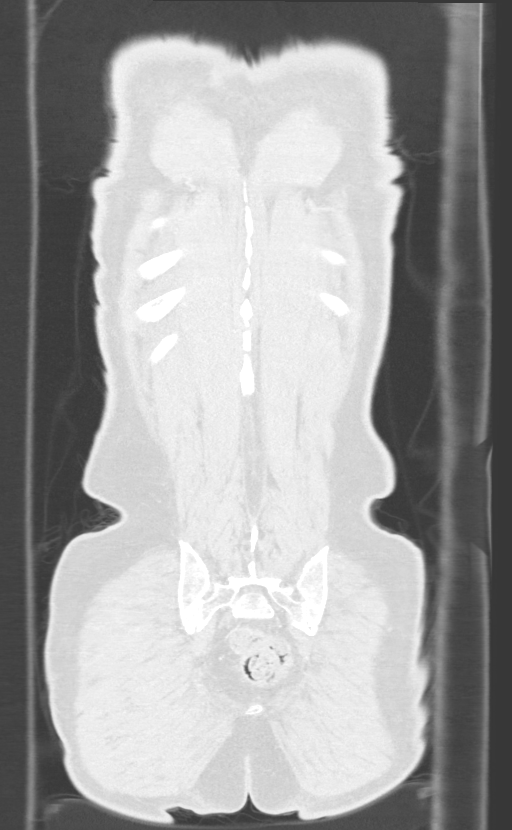
[im 72/180  lung]
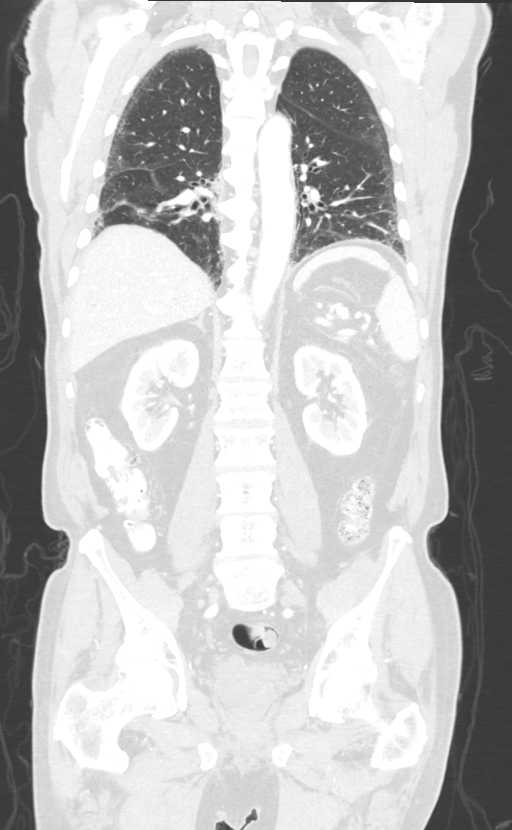
[im 108/180  lung]
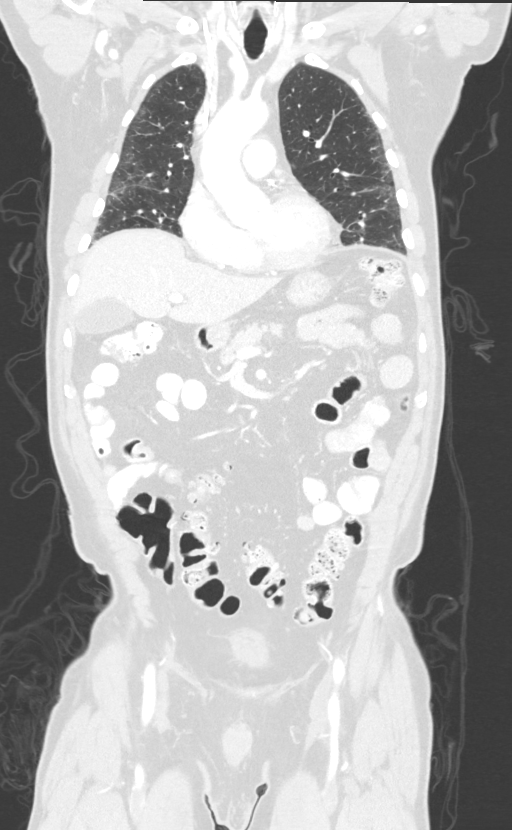

[13 of 36 positions shown; findings below may reference images not displayed]

RADIATION DOSE REDUCTION: This exam was performed according to the
departmental dose-optimization program which includes automated
exposure control, adjustment of the mA and/or kV according to
patient size and/or use of iterative reconstruction technique.

CONTRAST:  100mL OMNIPAQUE IOHEXOL 300 MG/ML  SOLN
FINDINGS: CT CHEST FINDINGS

Cardiovascular: Right Port-A-Cath tip high right atrium. Aortic
atherosclerosis. Tortuous thoracic aorta. Normal heart size, without
pericardial effusion. Lad coronary artery calcification. No central
pulmonary embolism, on this non-dedicated study.

Mediastinum/Nodes: No supraclavicular adenopathy. Small nodes within
the azygoesophageal recess are similar and not pathologic by size
criteria. No hilar adenopathy.

Lungs/Pleura: No pleural fluid.

Anterior left upper lobe pulmonary nodule measures 1.3 x 1.0 cm on
75/4, increased from 6 mm on the prior exam.

Subpleural lymph nodes along the left major fissure are unchanged
including at up to 7 mm.

Similar subpleural ground-glass, reticulation, and traction
bronchiolectasis, basilar predominant.

Musculoskeletal: No acute osseous abnormality.

CT ABDOMEN PELVIS FINDINGS

Hepatobiliary: Normal liver. Normal gallbladder, without biliary
ductal dilatation.

Pancreas: Normal, without mass or ductal dilatation.

Spleen: Normal in size, without focal abnormality.

Adrenals/Urinary Tract: Normal adrenal glands. Interpolar left renal
1.9 cm cyst. Normal right kidney. The bladder wall appears mildly
thickened with pericystic edema, similar.

Stomach/Bowel: Normal stomach, without wall thickening. Scattered
colonic diverticula. Normal terminal ileum and appendix. Normal
small bowel.

Vascular/Lymphatic: Aortic atherosclerosis. No abdominopelvic
adenopathy.

Reproductive: Normal prostate.

Other: No significant free fluid. No evidence of omental or
peritoneal disease.

Musculoskeletal: Bilateral L5 pars defects. Grade 2 L5-S1
anterolisthesis with degenerative disc disease at this level.
IMPRESSION: 1. Significant enlargement of anteromedial left upper lobe pulmonary
nodule, most consistent with metachronous primary or isolated
pulmonary metastasis.
2. No thoracic adenopathy.
3. No acute process or evidence of metastatic disease in the abdomen
or pelvis.
4. Interstitial lung disease, again favoring nonspecific
interstitial pneumonitis.
5. Coronary artery atherosclerosis. Aortic Atherosclerosis
([I4]-[I4]).
6. Similar mild bladder wall thickening and pericystic edema,
possibly representing chronic cystitis or a component of outlet
obstruction. Correlate with symptoms and possibly urinalysis.

## 2021-04-23 MED ORDER — HEPARIN SOD (PORK) LOCK FLUSH 100 UNIT/ML IV SOLN
500.0000 [IU] | Freq: Once | INTRAVENOUS | Status: AC
  Administered 2021-04-23: 500 [IU] via INTRAVENOUS

## 2021-04-23 MED ORDER — IOHEXOL 300 MG/ML  SOLN
100.0000 mL | Freq: Once | INTRAMUSCULAR | Status: AC | PRN
  Administered 2021-04-23: 100 mL via INTRAVENOUS

## 2021-04-23 MED ORDER — HEPARIN SOD (PORK) LOCK FLUSH 100 UNIT/ML IV SOLN
INTRAVENOUS | Status: AC
  Filled 2021-04-23: qty 5

## 2021-04-27 ENCOUNTER — Inpatient Hospital Stay: Payer: Medicare HMO

## 2021-04-27 ENCOUNTER — Other Ambulatory Visit: Payer: Self-pay

## 2021-04-27 ENCOUNTER — Inpatient Hospital Stay: Payer: Medicare HMO | Admitting: Physician Assistant

## 2021-04-27 VITALS — BP 142/81 | HR 63 | Temp 97.5°F | Wt 189.3 lb

## 2021-04-27 DIAGNOSIS — E039 Hypothyroidism, unspecified: Secondary | ICD-10-CM

## 2021-04-27 DIAGNOSIS — C3492 Malignant neoplasm of unspecified part of left bronchus or lung: Secondary | ICD-10-CM

## 2021-04-27 DIAGNOSIS — Z5111 Encounter for antineoplastic chemotherapy: Secondary | ICD-10-CM | POA: Diagnosis not present

## 2021-04-27 DIAGNOSIS — Z5112 Encounter for antineoplastic immunotherapy: Secondary | ICD-10-CM | POA: Diagnosis not present

## 2021-04-27 DIAGNOSIS — Z95828 Presence of other vascular implants and grafts: Secondary | ICD-10-CM

## 2021-04-27 LAB — CMP (CANCER CENTER ONLY)
ALT: 9 U/L (ref 0–44)
AST: 21 U/L (ref 15–41)
Albumin: 3.8 g/dL (ref 3.5–5.0)
Alkaline Phosphatase: 80 U/L (ref 38–126)
Anion gap: 3 — ABNORMAL LOW (ref 5–15)
BUN: 12 mg/dL (ref 8–23)
CO2: 28 mmol/L (ref 22–32)
Calcium: 8.9 mg/dL (ref 8.9–10.3)
Chloride: 93 mmol/L — ABNORMAL LOW (ref 98–111)
Creatinine: 0.93 mg/dL (ref 0.61–1.24)
GFR, Estimated: >60 mL/min (ref >60)
Glucose, Bld: 91 mg/dL (ref 70–99)
Potassium: 5 mmol/L (ref 3.5–5.1)
Sodium: 124 mmol/L — ABNORMAL LOW (ref 135–145)
Total Bilirubin: 0.4 mg/dL (ref 0.3–1.2)
Total Protein: 6.4 g/dL — ABNORMAL LOW (ref 6.5–8.1)

## 2021-04-27 LAB — CBC WITH DIFFERENTIAL (CANCER CENTER ONLY)
Abs Immature Granulocytes: 0.01 10*3/uL (ref 0.00–0.07)
Basophils Absolute: 0.1 10*3/uL (ref 0.0–0.1)
Basophils Relative: 1 %
Eosinophils Absolute: 0.4 10*3/uL (ref 0.0–0.5)
Eosinophils Relative: 7 %
HCT: 32.1 % — ABNORMAL LOW (ref 39.0–52.0)
Hemoglobin: 11.6 g/dL — ABNORMAL LOW (ref 13.0–17.0)
Immature Granulocytes: 0 %
Lymphocytes Relative: 22 %
Lymphs Abs: 1.4 10*3/uL (ref 0.7–4.0)
MCH: 33.5 pg (ref 26.0–34.0)
MCHC: 36.1 g/dL — ABNORMAL HIGH (ref 30.0–36.0)
MCV: 92.8 fL (ref 80.0–100.0)
Monocytes Absolute: 1 10*3/uL (ref 0.1–1.0)
Monocytes Relative: 16 %
Neutro Abs: 3.3 10*3/uL (ref 1.7–7.7)
Neutrophils Relative %: 54 %
Platelet Count: 218 10*3/uL (ref 150–400)
RBC: 3.46 MIL/uL — ABNORMAL LOW (ref 4.22–5.81)
RDW: 14.2 % (ref 11.5–15.5)
WBC Count: 6.3 10*3/uL (ref 4.0–10.5)
nRBC: 0 % (ref 0.0–0.2)

## 2021-04-27 LAB — TSH: TSH: 5.087 u[IU]/mL — ABNORMAL HIGH (ref 0.350–4.500)

## 2021-04-27 MED ORDER — SODIUM CHLORIDE 0.9% FLUSH
10.0000 mL | Freq: Once | INTRAVENOUS | Status: AC
  Administered 2021-04-27: 10 mL

## 2021-04-27 MED ORDER — HEPARIN SOD (PORK) LOCK FLUSH 100 UNIT/ML IV SOLN
500.0000 [IU] | Freq: Once | INTRAVENOUS | Status: AC | PRN
  Administered 2021-04-27: 500 [IU]

## 2021-04-27 MED ORDER — SODIUM CHLORIDE 0.9% FLUSH
10.0000 mL | INTRAVENOUS | Status: DC | PRN
  Administered 2021-04-27: 10 mL

## 2021-04-27 MED ORDER — SODIUM CHLORIDE 0.9 % IV SOLN
500.0000 mg/m2 | Freq: Once | INTRAVENOUS | Status: AC
  Administered 2021-04-27: 1000 mg via INTRAVENOUS
  Filled 2021-04-27: qty 40

## 2021-04-27 MED ORDER — PROCHLORPERAZINE MALEATE 10 MG PO TABS
10.0000 mg | ORAL_TABLET | Freq: Once | ORAL | Status: AC
  Administered 2021-04-27: 10 mg via ORAL
  Filled 2021-04-27: qty 1

## 2021-04-27 MED ORDER — LEVOTHYROXINE SODIUM 25 MCG PO TABS: 25.0000 ug | ORAL_TABLET | Freq: Every day | ORAL | 2 refills | Status: DC

## 2021-04-27 MED ORDER — SODIUM CHLORIDE 0.9 % IV SOLN: Freq: Once | INTRAVENOUS | Status: AC

## 2021-04-27 MED ORDER — SODIUM CHLORIDE 0.9 % IV SOLN
200.0000 mg | Freq: Once | INTRAVENOUS | Status: AC
  Administered 2021-04-27: 200 mg via INTRAVENOUS
  Filled 2021-04-27: qty 8

## 2021-04-27 NOTE — Patient Instructions (Signed)
[facility]COL[facility]  Discharge Instructions: Thank you for choosing [facility] to provide your oncology and hematology care.   If you have a lab appointment with the Cancer Center, please go directly to the Cancer Center and check in at the registration area.   Wear comfortable clothing and clothing appropriate for easy access to any Portacath or PICC line.   We strive to give you quality time with your provider. You may need to reschedule your appointment if you arrive late (15 or more minutes).  Arriving late affects you and other patients whose appointments are after yours.  Also, if you miss three or more appointments without notifying the office, you may be dismissed from the clinic at the provider's discretion.      For prescription refill requests, have your pharmacy contact our office and allow 72 hours for refills to be completed.    Today you received the following chemotherapy and/or immunotherapy agents Pembrolizumab (Keytruda) and Pemetrexed (Alimta)      To help prevent nausea and vomiting after your treatment, we encourage you to take your nausea medication as directed.  BELOW ARE SYMPTOMS THAT SHOULD BE REPORTED IMMEDIATELY: *FEVER GREATER THAN 100.4 F (38 C) OR HIGHER *CHILLS OR SWEATING *NAUSEA AND VOMITING THAT IS NOT CONTROLLED WITH YOUR NAUSEA MEDICATION *UNUSUAL SHORTNESS OF BREATH *UNUSUAL BRUISING OR BLEEDING *URINARY PROBLEMS (pain or burning when urinating, or frequent urination) *BOWEL PROBLEMS (unusual diarrhea, constipation, pain near the anus) TENDERNESS IN MOUTH AND THROAT WITH OR WITHOUT PRESENCE OF ULCERS (sore throat, sores in mouth, or a toothache) UNUSUAL RASH, SWELLING OR PAIN  UNUSUAL VAGINAL DISCHARGE OR ITCHING   Items with * indicate a potential emergency and should be followed up as soon as possible or go to the Emergency Department if any problems should occur.  Please show the CHEMOTHERAPY ALERT CARD or  IMMUNOTHERAPY ALERT CARD at check-in to the Emergency Department and triage nurse.  Should you have questions after your visit or need to cancel or reschedule your appointment, please contact [facility]  Dept: [phone]  and follow the prompts.  Office hours are 8:00 a.m. to 4:30 p.m. Monday - Friday. Please note that voicemails left after 4:00 p.m. may not be returned until the following business day.  We are closed weekends and major holidays. You have access to a nurse at all times for urgent questions. Please call the main number to the clinic Dept: [phone] and follow the prompts.   For any non-urgent questions, you may also contact your provider using MyChart. We now offer e-Visits for anyone 18 and older to request care online for non-urgent symptoms. For details visit [email or web address].   Also download the MyChart app! Go to the app store, search "MyChart", open the app, select [facility], and log in with your MyChart username and password.  Due to Covid, a mask is required upon entering the hospital/clinic. If you do not have a mask, one will be given to you upon arrival. For doctor visits, patients may have 1 support person aged 18 or older with them. For treatment visits, patients cannot have anyone with them due to current Covid guidelines and our immunocompromised population.   

## 2021-04-29 ENCOUNTER — Telehealth: Payer: Self-pay

## 2021-04-29 ENCOUNTER — Telehealth: Payer: Self-pay | Admitting: Physician Assistant

## 2021-04-29 NOTE — Telephone Encounter (Signed)
I called the patient to let him know I started him on a low dose of synthroid for his hypothyroidism. He expressed understanding and will pick this up and begin taking this tomorrow.  ?

## 2021-04-29 NOTE — Telephone Encounter (Signed)
In-person appointment reminder. I verified patient identity and reminded patient of his 9:00am-04/30/21 in-person appointment w/ Shona Simpson PA-C. I advised patient to arrive 7min early for check-in. I left my extension [phone] in case patient needs anything. Patient verbalized understanding of information. ?

## 2021-04-30 ENCOUNTER — Other Ambulatory Visit: Payer: Self-pay

## 2021-04-30 ENCOUNTER — Ambulatory Visit
Admission: RE | Admit: 2021-04-30 | Discharge: 2021-04-30 | Disposition: A | Discharge status: Home / Self Care | Payer: Medicare HMO | Source: Ambulatory Visit | Attending: Radiation Oncology | Admitting: Radiation Oncology

## 2021-04-30 ENCOUNTER — Encounter: Payer: Self-pay | Admitting: Radiation Oncology

## 2021-04-30 VITALS — BP 132/87 | HR 66 | Temp 96.7°F | Resp 18 | Ht 73.0 in | Wt 188.0 lb

## 2021-04-30 DIAGNOSIS — M4317 Spondylolisthesis, lumbosacral region: Secondary | ICD-10-CM | POA: Diagnosis not present

## 2021-04-30 DIAGNOSIS — C7931 Secondary malignant neoplasm of brain: Secondary | ICD-10-CM | POA: Insufficient documentation

## 2021-04-30 DIAGNOSIS — I700 Atherosclerosis of aorta: Secondary | ICD-10-CM | POA: Insufficient documentation

## 2021-04-30 DIAGNOSIS — C3412 Malignant neoplasm of upper lobe, left bronchus or lung: Secondary | ICD-10-CM | POA: Insufficient documentation

## 2021-04-30 DIAGNOSIS — Z79899 Other long term (current) drug therapy: Secondary | ICD-10-CM | POA: Insufficient documentation

## 2021-04-30 DIAGNOSIS — T451X5A Adverse effect of antineoplastic and immunosuppressive drugs, initial encounter: Secondary | ICD-10-CM | POA: Diagnosis not present

## 2021-04-30 DIAGNOSIS — R609 Edema, unspecified: Secondary | ICD-10-CM | POA: Diagnosis not present

## 2021-04-30 DIAGNOSIS — Z923 Personal history of irradiation: Secondary | ICD-10-CM | POA: Diagnosis not present

## 2021-04-30 DIAGNOSIS — I2510 Atherosclerotic heart disease of native coronary artery without angina pectoris: Secondary | ICD-10-CM | POA: Diagnosis not present

## 2021-04-30 DIAGNOSIS — C3492 Malignant neoplasm of unspecified part of left bronchus or lung: Secondary | ICD-10-CM

## 2021-04-30 DIAGNOSIS — J910 Malignant pleural effusion: Secondary | ICD-10-CM | POA: Insufficient documentation

## 2021-04-30 DIAGNOSIS — R110 Nausea: Secondary | ICD-10-CM | POA: Diagnosis not present

## 2021-04-30 NOTE — Progress Notes (Signed)
?Radiation Oncology         [phone] ?________________________________ ? ?Outpatient Reconsultation ? ?Name: Peter Brown        MRN: [id number]  ?Date of Service: 04/30/2021 DOB: 1940/01/06 ? ?YH:CWCBJ, Eddie Dibbles, MD  Curt Bears, MD    ? ?REFERRING PHYSICIAN: Curt Bears, MD ? ? 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 ? ? ?HISTORY OF PRESENT ILLNESS: Peter Brown is a 81 y.o. male with a diagnosis of stage IV lung cancer.  The patient had been experiencing left chest wall discomfort and discomfort in the back, he was evaluated with chest x-ray which showed a left pleural effusion.  He was sent for evaluation with IR and underwent thoracentesis with successful drainage of 650 mL of pleural fluid on 07/21/2020.  Final cytology showed malignant cells consistent with adenocarcinoma.  He underwent PET imaging on 08/01/2020 which revealed a 14 mm left upper lobe pulmonary nodule that was hypermetabolic with an SUV of up to 10.1.  AP window adenopathy was hypermetabolic as well as some smaller pleural nodules with ranging SUVs between 3 and 4 consistent with an associated left malignant pleural effusion.  No other evidence of distant disease was identified.  Screening MRI for staging on 08/13/2020 showed a 1 cm enhancing lesion in the left temporal lobe.  He began systemic  chemo immunotherapy on 08/13/2020.  He received single fraction stereotactic radiosurgery on 09/03/20 which he tolerated well.  ? ?He had a repeat MRI of the brain for his first baseline comparison on 12/11/20. This showed further decrease in size of his previously treated left temporal lesion, now measuring 4 mm. This had been 10 mm at the time of treatment.  He had an MRI scan on 03/12/2021 of the brain which showed enlargement in the treated left temporal lobe metastases region with new regional vasogenic edema.  The area measured up to 14 mm was more solidly enhancing than previous with confluent new T2 and flair  hyperintensity about the lesion suggesting regional vasogenic edema.  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  He continues with Alimta and Keytruda with Dr. Julien Nordmann. He is due for his next MRI brain on 05/15/21, and for review in brain oncology conference on 05/18/21. I will call him with results after the conference discussion.  ? ?He's seen today however as recent restaging imaging ha shown an increase in the LUL nodule measuring 1.3 cm by CT on 04/23/21. No new or progressive disease was otherwise seen. Intervals are as followed for this nodule: 1.4 cm by PET in July 2022, 9 mm by CT in October 2022, 7 mm by CT in December 2022, 5 mm by CT in February 2023, and 1.3 cm April 2023. He was referred back to discuss stereotactic body radiotherapy (SBRT) to this site. ? ?04/23/21 CT ? ? ? ?PREVIOUS RADIATION THERAPY:  ? ?09/03/2020 through 09/03/2020 SRS Treatment ?Site Technique Total Dose (Gy) Dose per Fx (Gy) Completed Fx Beam Energies  ?Brain: Brain ?[id number] 3D 20/20 20 1/1 6XFFF  ? ? ? ?PAST MEDICAL HISTORY:  ?Past Medical History:  ?Diagnosis Date  ? Lung cancer ([place])   ? Lung cancer metastatic to brain [facility])   ?   ? ? ?PAST SURGICAL HISTORY: ?Past Surgical History:  ?Procedure Laterality Date  ? IR IMAGING GUIDED PORT INSERTION  11/24/2020  ? KNEE SURGERY    ? orthoscopic    ?  TONSILLECTOMY AND ADENOIDECTOMY    ? ? ? ?FAMILY HISTORY: History reviewed. No pertinent family history. ? ? ?SOCIAL HISTORY:  reports that he has never smoked. He has never used smokeless tobacco. He reports that he does not drink alcohol and does not use drugs. The patient is married and lives in [place], [place]. They have 9 grandchildren and enjoy supporting them. One grandson is very active in the theatre. ? ? ?ALLERGIES: Patient has no known allergies. ? ? ?MEDICATIONS:  ?Current Outpatient  Medications  ?Medication Sig Dispense Refill  ? aspirin 81 MG EC tablet Take by mouth.    ? Bacillus Coagulans-Inulin (PROBIOTIC) 1-250 BILLION-MG CAPS Take 1 tablet by mouth daily.    ? bisacodyl (DULCOLAX) 5 MG EC tablet Take 5 mg by mouth 3 (three) times daily as needed for moderate constipation.    ? Cholecalciferol (D3 PO) Take 1 tablet by mouth daily.    ? coal tar-salicylic acid 2 % shampoo Apply topically daily as needed for itching.    ? DULoxetine (CYMBALTA) 30 MG capsule Take 30 mg by mouth daily.    ? fluocinonide cream (LIDEX) 2.59 % Apply 1 application topically 2 (two) times daily.    ? folic acid (FOLVITE) 1 MG tablet TAKE 1 TABLET(1 MG) BY MOUTH DAILY 30 tablet 4  ? hydrocortisone cream 1 % Apply 1 application topically 2 (two) times daily. 453.6 g 0  ? ketoconazole (NIZORAL) 2 % cream Apply 1 application topically daily.    ? Krill Oil (OMEGA-3) 500 MG CAPS Take by mouth.    ? levothyroxine (SYNTHROID) 25 MCG tablet Take 1 tablet (25 mcg total) by mouth daily before breakfast. 30 tablet 2  ? lidocaine-prilocaine (EMLA) cream Apply 1 application topically as needed. 30 g 1  ? loratadine (CLARITIN) 10 MG tablet Take by mouth.    ? Magnesium 250 MG TABS Take by mouth.    ? Niacin (VITAMIN B-3 PO) Take 1 tablet by mouth daily.    ? omeprazole (PRILOSEC) 40 MG capsule Take 40 mg by mouth daily.    ? ondansetron (ZOFRAN-ODT) 4 MG disintegrating tablet Take 1 tablet (4 mg total) by mouth every 8 (eight) hours as needed. 20 tablet 0  ? prochlorperazine (COMPAZINE) 10 MG tablet TAKE 1 TABLET(10 MG) BY MOUTH EVERY 6 HOURS AS NEEDED FOR NAUSEA OR VOMITING 30 tablet 0  ? triamcinolone cream (KENALOG) 0.1 % Apply 1 application topically daily as needed. 453 g 6  ? ?No current facility-administered medications for this encounter.  ? ? ? ?REVIEW OF SYSTEMS: On review of systems, the patient reports that he is doing pretty well overall without regular headaches, visual or audio disturbances. He denies any  uncontrolled movements or weakness. He feels nauseated for a few days after his alimta infusion and has avoided antiemetics out of concerns for constipation. He is not short of breath, but has some chest discomfort with exertion that resolves with rest. He wears compression hose for lower extremity edema. No other complaints are verbalized.  ? ? ?PHYSICAL EXAM:  ?Wt Readings from Last 3 Encounters:  ?04/30/21 188 lb (85.3 kg)  ?04/27/21 189 lb 4.8 oz (85.9 kg)  ?04/06/21 189 lb 3 oz (85.8 kg)  ? ?Temp Readings from Last 3 Encounters:  ?04/30/21 (!) 96.7 ?F (35.9 ?C) (Temporal)  ?04/27/21 (!) 97.5 ?F (36.4 ?C)  ?04/06/21 (!) 97.5 ?F (36.4 ?C) (Tympanic)  ? ?BP Readings from Last 3 Encounters:  ?04/30/21 132/87  ?04/27/21 (!) 142/81  ?04/06/21  130/76  ? ?Pulse Readings from Last 3 Encounters:  ?04/30/21 66  ?04/27/21 63  ?04/06/21 61  ? ?In general this is a well appearing caucasian male in no acute distress. He's alert and oriented x4 and appropriate throughout the examination. Cardiopulmonary assessment is negative for acute distress and he exhibits normal effort.  ? ? ? ?ECOG = 1 ? ?0 - Asymptomatic (Fully active, able to carry on all predisease activities without restriction) ? ?1 - Symptomatic but completely ambulatory (Restricted in physically strenuous activity but ambulatory and able to carry out work of a light or sedentary nature. For example, light housework, office work) ? ?2 - Symptomatic, <50% in bed during the day (Ambulatory and capable of all self care but unable to carry out any work activities. Up and about more than 50% of waking hours) ? ?3 - Symptomatic, >50% in bed, but not bedbound (Capable of only limited self-care, confined to bed or chair 50% or more of waking hours) ? ?4 - Bedbound (Completely disabled. Cannot carry on any self-care. Totally confined to bed or chair) ? ?5 - Death ? ? Oken MM, Creech RH, Tormey DC, et al. [phone]). "Toxicity and response criteria of the [facility] Group". [place] Oncol. 5 (6): 649-55 ? ? ? ?LABORATORY DATA:  ?Lab Results  ?Component Value Date  ? WBC 6.3 04/27/2021  ? HGB 11.6 (L) 04/27/2021  ? HCT 32.1 (L) 04/27/2021  ? MCV 92.8 04/27/2021  ? PLT 21

## 2021-04-30 NOTE — Addendum Note (Signed)
Encounter addended by: Hayden Pedro, PA-C on: 04/30/2021 10:08 AM ? Actions taken: Clinical Note Signed, Level of Service modified

## 2021-04-30 NOTE — Progress Notes (Signed)
Reconsult appointment. I verified patient identity and began nursing interview w/ spouse Mrs. Wendie Chess in attendance. Patient reports chest discomfort 5/10, nausea, and bilateral hands and feet swelling. No other issues reported at this time. ? ?Meaningful use complete. ? ?BP 132/87 (BP Location: Left Arm, Patient Position: Sitting, Cuff Size: Normal)   Pulse 66   Temp (!) 96.7 ?F (35.9 ?C) (Temporal)   Resp 18   Ht 6\' 1"  (1.854 m)   Wt 188 lb (85.3 kg)   SpO2 98%   BMI 24.80 kg/m?  ? ?

## 2021-05-01 ENCOUNTER — Telehealth: Payer: Self-pay | Admitting: Hematology & Oncology

## 2021-05-01 ENCOUNTER — Telehealth: Payer: Self-pay | Admitting: Medical Oncology

## 2021-05-01 NOTE — Telephone Encounter (Signed)
I was called by [facility] about Peter Brown in the ER in [place], [place]. He apparently went there because of a change in mental status. I spoke with the ER doctor.  He said there was a Na+ of 128.  A CT of the brain showed some vasogenic edema.  There is nothing new in the brain on CT scan.  Another MD, who knew the family ,felt that the patient was at his baseline.  His CXR was without pnuemonia.  EKG was ok.  Vital signs were normal.  He had a normal Ca2+.  Renal function was ok.  CBC looked good.  There was no fever.   ? ?I did not think that he needed to be transferred to [facility].  He seemed quite stable in talking with the ER doctor.  The ER doctor felt that he could go home.  He will be given some Decadron in the ER and then placed on  Decadron as an outpatient. ? ?I feel that he was given a complete assessment by the ER doctor and I agree with his recommendations. ? ?I will let Dr. Julien Nordmann know of the ER visit. ? ?Lattie Haw, MD ?

## 2021-05-01 NOTE — Telephone Encounter (Signed)
"  Confused with weakness. Some nausea but it is getting better" ?Today ,he cannot remember his dtrs last name, cannot figure out how to work his computer and look up numbers on his phone ? ?04/27/21 sodium 124 ?04/06/21 sodium 132 ? ?Instructions- Per Cassie , I instructed wife and spoke to pt to go to ED. She will take him to [facility]. ?

## 2021-05-03 ENCOUNTER — Encounter: Payer: Self-pay | Admitting: Radiation Oncology

## 2021-05-03 ENCOUNTER — Encounter: Payer: Self-pay | Admitting: Internal Medicine

## 2021-05-04 ENCOUNTER — Telehealth: Payer: Self-pay | Admitting: Radiation Therapy

## 2021-05-04 ENCOUNTER — Telehealth: Payer: Self-pay | Admitting: Radiation Oncology

## 2021-05-04 ENCOUNTER — Other Ambulatory Visit: Payer: Self-pay | Admitting: Radiation Oncology

## 2021-05-04 ENCOUNTER — Other Ambulatory Visit: Payer: Self-pay | Admitting: Radiation Therapy

## 2021-05-04 ENCOUNTER — Ambulatory Visit
Admission: RE | Admit: 2021-05-04 | Discharge: 2021-05-04 | Disposition: A | Discharge status: Home / Self Care | Payer: Self-pay | Source: Ambulatory Visit | Attending: Radiation Oncology | Admitting: Radiation Oncology

## 2021-05-04 DIAGNOSIS — C3492 Malignant neoplasm of unspecified part of left bronchus or lung: Secondary | ICD-10-CM

## 2021-05-04 MED ORDER — DEXAMETHASONE 4 MG PO TABS: 4.0000 mg | ORAL_TABLET | Freq: Two times a day (BID) | ORAL | 1 refills | Status: DC

## 2021-05-04 NOTE — Telephone Encounter (Signed)
The patient went to the ED in [place] weekend.  He was confused at the time which is what prompted the visit, he had a CT scan of the brain that did show edema.  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  Because of ongoing Keytruda we elected not to start systemic steroids because he was asymptomatic.  The patient's steroid dose that was prescribed from the ER I feel is to lower the dose so we discussed switching to dexamethasone 4 mg twice daily, pushing up his MRI scan which was previously scheduled for 05/15/2021 to this coming Friday.  We will introduce him to Dr. Mickeal Skinner as well for assistance with steroid management in the setting of [place]. ?

## 2021-05-04 NOTE — Telephone Encounter (Signed)
I spoke with Mrs. Soderlund about the patient's updated brain MRI appointment and visit with Dr. Mickeal Skinner. She was thankful for the call and plans to have him here. ? ?Mont Dutton R.T.(R)(T) ?Radiation Special Procedures Navigator  ?

## 2021-05-05 ENCOUNTER — Encounter: Payer: Self-pay | Admitting: Internal Medicine

## 2021-05-07 ENCOUNTER — Ambulatory Visit
Admission: RE | Admit: 2021-05-07 | Discharge: 2021-05-07 | Disposition: A | Discharge status: Home / Self Care | Payer: Medicare HMO | Source: Ambulatory Visit | Attending: Radiation Oncology | Admitting: Radiation Oncology

## 2021-05-07 DIAGNOSIS — C7931 Secondary malignant neoplasm of brain: Secondary | ICD-10-CM

## 2021-05-07 IMAGING — MR MR HEAD WO/W CM
12 series · 48 of 48 positions shown · IV contrast (17 ml multihance)
Comparison: Brain MRI [DATE] [DATE]

CLINICAL DATA: Brain metastases

EXAM:
MRI HEAD WITHOUT AND WITH CONTRAST
TECHNIQUE: Multiplanar, multiecho pulse sequences of the brain and surrounding
structures were obtained without and with intravenous contrast.
CONTRAST:  17mL MULTIHANCE GADOBENATE DIMEGLUMINE 529 MG/ML IV SOLN

[Series 2: FLAIR · sagittal · 3.0mm · 0.75mm/px · 3 of 39 slices shown (1 of 2)]
[im 1/39]
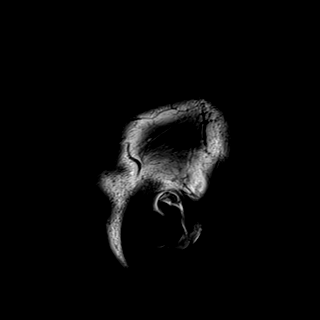
[im 20/39]
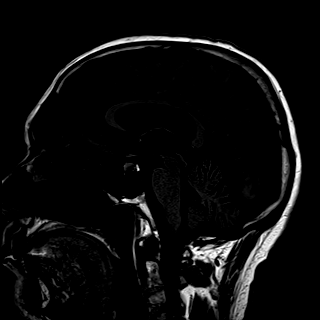
[im 39/39]
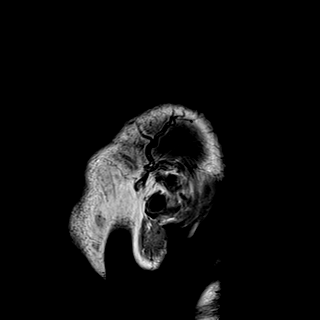

[Series 3: DWI · axial · 3.0mm · 1.50mm/px · z∈[-31,+113]mm · 4 of 78 slices shown (1 of 2)]
[im 1/78]
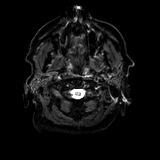
[im 26/78]
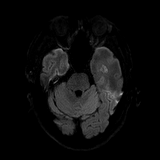
[im 52/78]
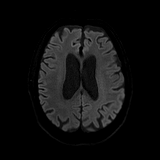
[im 78/78]
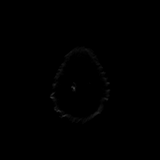

[Series 4: DWI · axial · 3.0mm · 1.50mm/px · z∈[-31,+113]mm · 2 of 39 slices shown (2 of 2)]
[im 1/39]
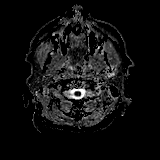
[im 39/39]
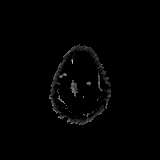

[Series 5: T2 · axial · 5.0mm · 0.57mm/px · 1 of 27 slices shown (1 of 2)]
[im 1/27]
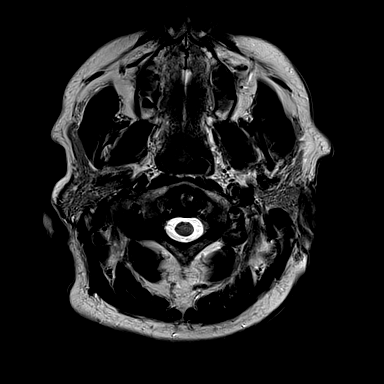

[Series 7: swi_images · axial · 1.5mm · 0.90mm/px · z∈[-36,+114]mm · 5 of 104 slices shown]
[im 1/104]
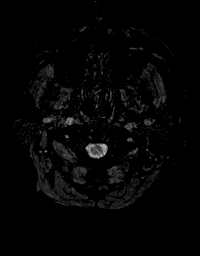
[im 26/104]
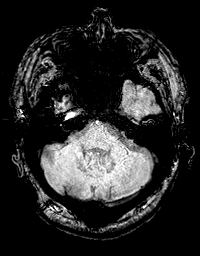
[im 52/104]
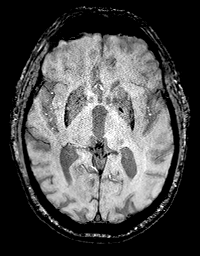
[im 78/104]
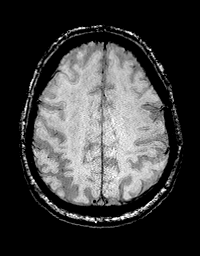
[im 104/104]
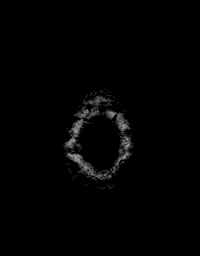

[Series 8: FLAIR · axial · 3.0mm · 0.86mm/px · z∈[-91,+98]mm · 3 of 63 slices shown (2 of 2)]
[im 1/63]
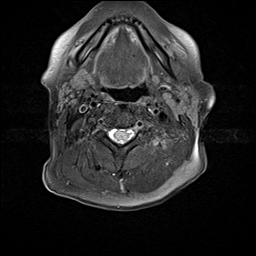
[im 32/63]
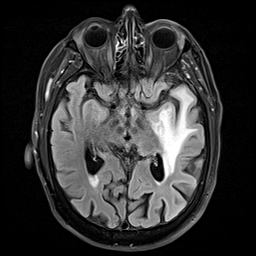
[im 63/63]
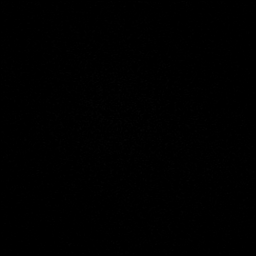

[Series 9: T2 · axial · non-contrast · 1.0mm · 0.86mm/px · z∈[-75,+96]mm · 8 of 176 slices shown (2 of 2)]
[im 1/176]
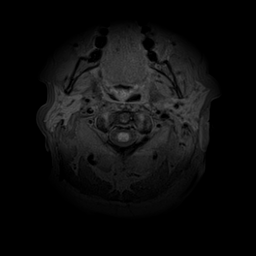
[im 26/176]
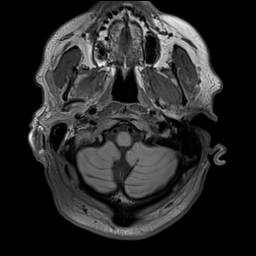
[im 51/176]
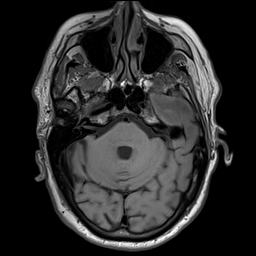
[im 76/176]
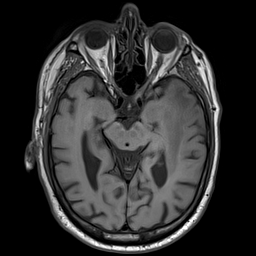
[im 101/176]
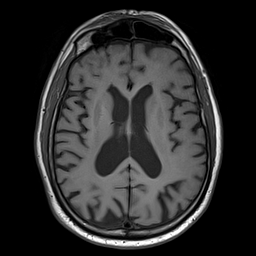
[im 126/176]
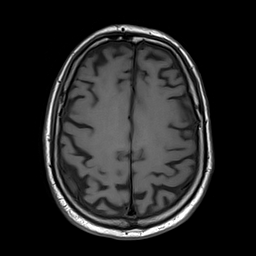
[im 151/176]
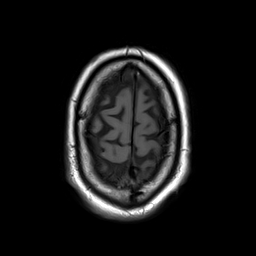
[im 176/176]
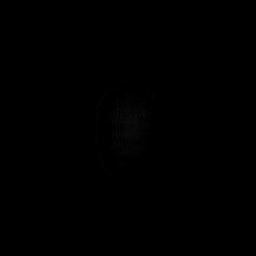

[Series 10: T2 post-contrast · coronal · 3.0mm · 0.57mm/px · 2 of 47 slices shown (1 of 2)]
[im 1/47]
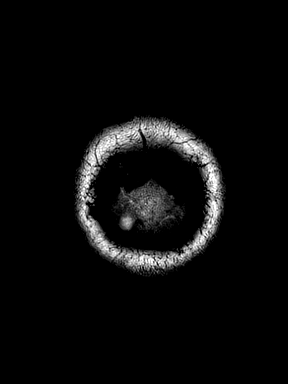
[im 47/47]
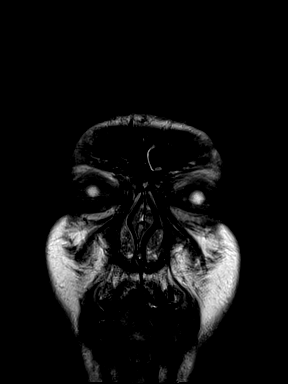

[Series 11: T2 post-contrast · axial · 1.0mm · 0.86mm/px · z∈[-75,+96]mm · 8 of 176 slices shown (2 of 2)]
[im 1/176]
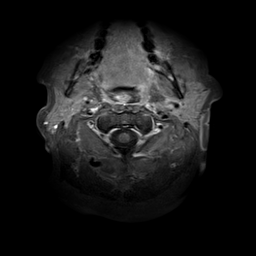
[im 26/176]
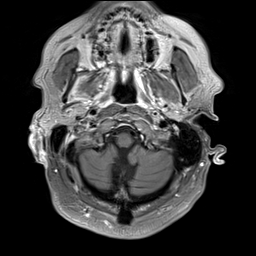
[im 51/176]
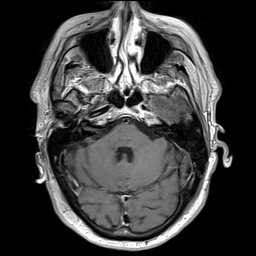
[im 76/176]
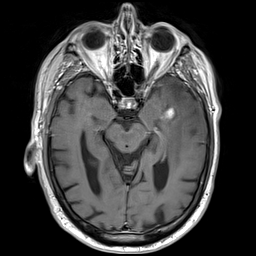
[im 101/176]
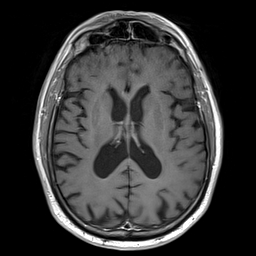
[im 126/176]
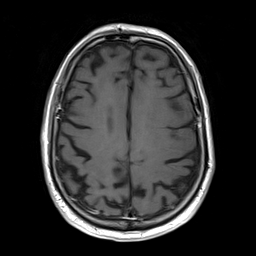
[im 151/176]
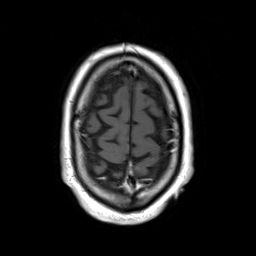
[im 176/176]
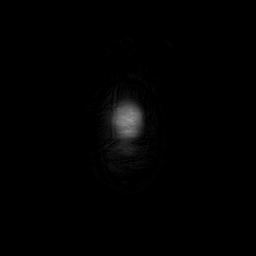

[Series 12: T1 post-contrast · axial · 1.0mm · 0.75mm/px · z∈[-70,+89]mm · 8 of 160 slices shown (1 of 2)]
[im 1/160]
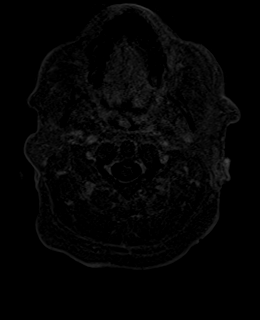
[im 23/160]
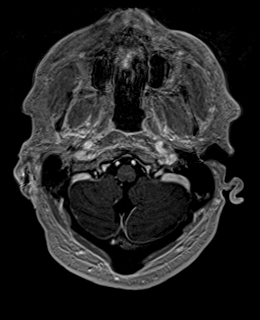
[im 46/160]
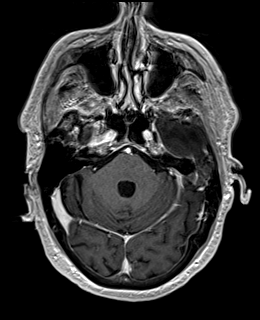
[im 69/160]
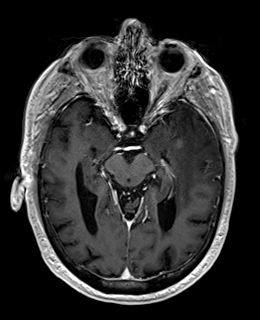
[im 91/160]
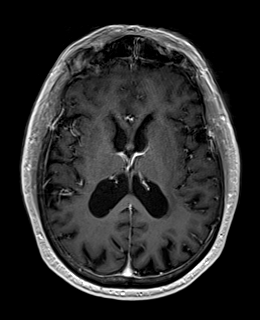
[im 114/160]
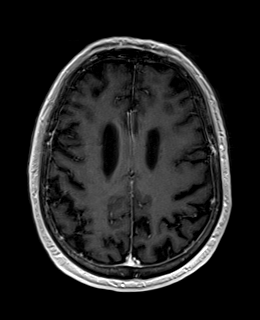
[im 137/160]
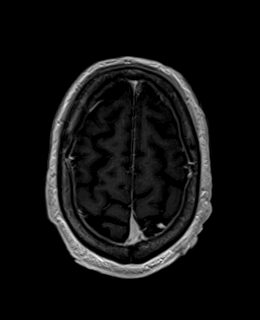
[im 160/160]
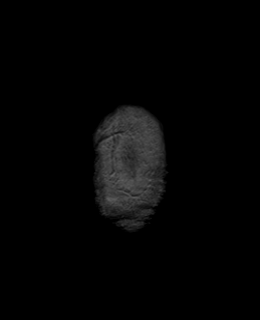

[Series 13: T1 post-contrast · coronal · 3.0mm · 0.57mm/px · 2 of 47 slices shown (2 of 2)]
[im 1/47]
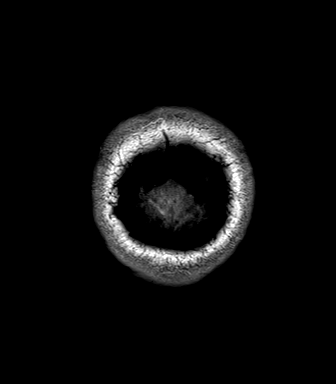
[im 47/47]
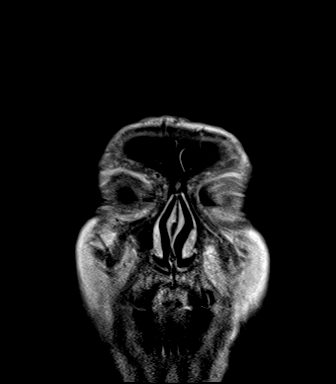

[Series 14: FLAIR post-contrast · sagittal · 3.0mm · 0.75mm/px · 2 of 39 slices shown]
[im 1/39]
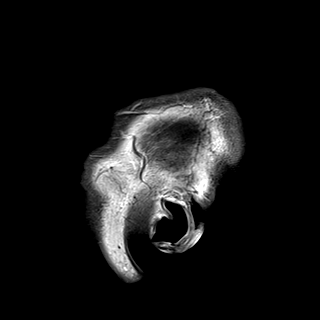
[im 39/39]
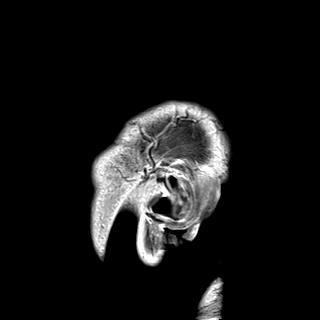

[48 of 48 positions shown; findings below may reference images not displayed]

FINDINGS: Brain: The enhancing lesion in the left anterior temporal lobe
continues to increase in size, currently measuring 2.0 cm AP x
cm TV, previously measured 1.2 cm AP x 1.0 cm TV. Small foci of SWI
signal dropout are consistent with small amount of intralesional
hemorrhage. Edema surrounding the lesion throughout the left
temporal lobe has markedly worsened since [DATE]. There is no
midline shift.

No new enhancing lesions are identified.

There is no acute intracranial hemorrhage, extra-axial fluid
collection, or acute infarct. The ventricles are stable in size.
Parenchymal signal is otherwise essentially normal, with no
significant burden of underlying white matter microangiopathic
change.

Vascular: Normal flow voids.

Skull and upper cervical spine: Normal marrow signal.

Sinuses/Orbits: There is trace mucosal thickening in the paranasal
sinuses. The globes and orbits are unremarkable.

Other: None.
IMPRESSION: 1. The enhancing lesion in the left temporal lobe continues to
increase in size, with significantly increased surrounding FLAIR
signal abnormality. No midline shift.
2. No new lesions identified.

## 2021-05-07 MED ORDER — HEPARIN SOD (PORK) LOCK FLUSH 100 UNIT/ML IV SOLN
500.0000 [IU] | Freq: Once | INTRAVENOUS | Status: AC
  Administered 2021-05-07: 500 [IU] via INTRAVENOUS

## 2021-05-07 MED ORDER — GADOBENATE DIMEGLUMINE 529 MG/ML IV SOLN
17.0000 mL | Freq: Once | INTRAVENOUS | Status: AC | PRN
  Administered 2021-05-07: 17 mL via INTRAVENOUS

## 2021-05-07 MED ORDER — SODIUM CHLORIDE 0.9% FLUSH
10.0000 mL | INTRAVENOUS | Status: DC | PRN
  Administered 2021-05-07: 10 mL via INTRAVENOUS

## 2021-05-08 ENCOUNTER — Encounter: Payer: Self-pay | Admitting: Radiation Oncology

## 2021-05-08 NOTE — Progress Notes (Signed)
Telephone appointment. I spoke w/ patient's spouse Mrs. Peter Brown, verified her identity and began nursing interview. She reports patient Mr. Peter Brown is having some mild LT chest discomfort and occasional headaches. No other issues reported at this time. ? ?Meaningful use complete. ? ?Reminded spouse of patient's 1:00pm-05/11/21 telephone appointment w/ Shona Simpson PA-C. I left my extension [phone] in case patient needs anything. Mrs. Boliver verbalized understanding of the conversation. ? ?Patient contact [phone] ? ?

## 2021-05-11 ENCOUNTER — Ambulatory Visit
Admission: RE | Admit: 2021-05-11 | Discharge: 2021-05-11 | Disposition: A | Discharge status: Home / Self Care | Payer: Medicare HMO | Source: Ambulatory Visit | Attending: Radiation Oncology | Admitting: Radiation Oncology

## 2021-05-11 ENCOUNTER — Inpatient Hospital Stay: Payer: Medicare HMO | Attending: Internal Medicine

## 2021-05-11 DIAGNOSIS — Z5112 Encounter for antineoplastic immunotherapy: Secondary | ICD-10-CM | POA: Insufficient documentation

## 2021-05-11 DIAGNOSIS — C3492 Malignant neoplasm of unspecified part of left bronchus or lung: Secondary | ICD-10-CM

## 2021-05-11 DIAGNOSIS — C3412 Malignant neoplasm of upper lobe, left bronchus or lung: Secondary | ICD-10-CM | POA: Insufficient documentation

## 2021-05-11 DIAGNOSIS — J910 Malignant pleural effusion: Secondary | ICD-10-CM | POA: Insufficient documentation

## 2021-05-11 DIAGNOSIS — Z79899 Other long term (current) drug therapy: Secondary | ICD-10-CM | POA: Insufficient documentation

## 2021-05-11 DIAGNOSIS — C7931 Secondary malignant neoplasm of brain: Secondary | ICD-10-CM | POA: Insufficient documentation

## 2021-05-11 DIAGNOSIS — C782 Secondary malignant neoplasm of pleura: Secondary | ICD-10-CM | POA: Insufficient documentation

## 2021-05-11 DIAGNOSIS — Z5111 Encounter for antineoplastic chemotherapy: Secondary | ICD-10-CM | POA: Insufficient documentation

## 2021-05-11 NOTE — Progress Notes (Addendum)
?Radiation Oncology         [phone] ?________________________________ ? ? Outpatient Follow Up- Conducted via telephone due to current COVID-19 concerns for limiting patient exposure ? ?I spoke with the patient to conduct this consult visit via telephone to spare the patient unnecessary potential exposure in the healthcare setting during the current COVID-19 pandemic. The patient was notified in advance and was offered a [facility] meeting to allow for face to face communication but unfortunately reported that they did not have the appropriate resources/technology to support such a visit and instead preferred to proceed with a telephone visit.  ? ?  ? ?Name: Peter Brown        MRN: [id number]  ?Date of Service: 05/11/2021 DOB: 1940/11/25 ? ?TK:[id number], Eddie Dibbles, MD  [name], MD    ? ?REFERRING PHYSICIAN: [name], MD ? ? ?DIAGNOSIS: The encounter diagnosis was Adenocarcinoma, lung, left ([place]). ? ? ?HISTORY OF PRESENT ILLNESS: Peter Brown is a 81 y.o. male with a diagnosis of stage IV lung cancer.  The patient had been experiencing left chest wall discomfort and discomfort in the back, he was evaluated with chest x-ray which showed a left pleural effusion.  He was sent for evaluation with IR and underwent thoracentesis with successful drainage of 650 mL of pleural fluid on 07/21/2020.  Final cytology showed malignant cells consistent with adenocarcinoma.  He underwent PET imaging on 08/01/2020 which revealed a 14 mm left upper lobe pulmonary nodule that was hypermetabolic with an SUV of up to 10.1.  AP window adenopathy was hypermetabolic as well as some smaller pleural nodules with ranging SUVs between 3 and 4 consistent with an associated left malignant pleural effusion.  No other evidence of distant disease was identified.  Screening MRI for staging on 08/13/2020 showed a 1 cm enhancing lesion in the left temporal lobe.  He began systemic  chemo immunotherapy on 08/13/2020.  He received single fraction stereotactic  radiosurgery on 09/03/20 which he tolerated well.  ? ?He had a repeat MRI of the brain for his first baseline comparison on 12/11/20. This showed further decrease in size of his previously treated left temporal lesion, now measuring 4 mm. This had been 10 mm at the time of treatment.  He had an MRI scan on 03/12/2021 of the brain which showed enlargement in the treated left temporal lobe metastases region with new regional vasogenic edema.  The area measured up to 14 mm was more solidly enhancing than previous with confluent new T2 and flair hyperintensity about the lesion suggesting regional vasogenic edema.  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  He continues with Alimta and Keytruda with Dr. Julien Nordmann. He is due for his next MRI brain on 05/15/21, and for review in brain oncology conference on 05/18/21. I will call him with results after the conference discussion.  ? ?Recent restaging imaging had shown an increase in the LUL nodule measuring 1.3 cm by CT on 04/23/21. No new or progressive disease was otherwise seen. Intervals are as followed for this nodule: 1.4 cm by PET in July 2022, 9 mm by CT in October 2022, 7 mm by CT in December 2022, 5 mm by CT in February 2023, and 1.3 cm April 2023. He was referred back to discuss stereotactic body radiotherapy (SBRT) to this site but decided to proceed closer to home with Dr. Rhunette Croft in [place]. He did have  symptoms of confusion last weekend and presented to the ED in [place] and was found to have edema in the brain. He was started in [place] on 0.5 mg Dexamethasone, but we changed his dose on 05/04/21 to 4 mg BID of Dexamethasone.  He went for MRI on 05/07/21 that showed the enhancing lesion in the left temporal lobe still increasing in size up to 2 cm, previously 1.2 cm. There was increasing surrounding flair. He is scheduled to meet with  Dr. Mickeal Skinner tomorrow and is contacted to review recommendatitions. He is also in the process of coordinating SBRT in [place]. ? ?04/23/21 CT ? ? ? ?PREVIOUS RADIATION THERAPY:  ? ?09/03/2020 through 09/03/2020 SRS Treatment ?Site Technique Total Dose (Gy) Dose per Fx (Gy) Completed Fx Beam Energies  ?Brain: Brain ?[id number] 3D 20/20 20 1/1 6XFFF  ? ? ? ?PAST MEDICAL HISTORY:  ?Past Medical History:  ?Diagnosis Date  ? Lung cancer ([place])   ? 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   ? ? ?PAST SURGICAL HISTORY: ?Past Surgical History:  ?Procedure Laterality Date  ? IR IMAGING GUIDED PORT INSERTION  11/24/2020  ? KNEE SURGERY    ? orthoscopic    ? TONSILLECTOMY AND ADENOIDECTOMY    ? ? ? ?FAMILY HISTORY: History reviewed. No pertinent family history. ? ? ?SOCIAL HISTORY:  reports that he has never smoked. He has never used smokeless tobacco. He reports that he does not drink alcohol and does not use drugs. The patient is married and lives in [place], [place]. They have 9 grandchildren and enjoy supporting them. One grandson is very active in the theatre. ? ? ?ALLERGIES: Patient has no known allergies. ? ? ?MEDICATIONS:  ?Current Outpatient Medications  ?Medication Sig Dispense Refill  ? aspirin 81 MG EC tablet Take by mouth.    ? Bacillus Coagulans-Inulin (PROBIOTIC) 1-250 BILLION-MG CAPS Take 1 tablet by mouth daily.    ? bisacodyl (DULCOLAX) 5 MG EC tablet Take 5 mg by mouth 3 (three) times daily as needed for moderate constipation.    ? Cholecalciferol (D3 PO) Take 1 tablet by mouth daily.    ? coal tar-salicylic acid 2 % shampoo Apply topically daily as needed for itching.    ? dexamethasone (DECADRON) 4 MG tablet Take 1 tablet (4 mg total) by mouth 2 (two) times daily. 90 tablet 1  ? DULoxetine (CYMBALTA) 30 MG capsule Take 30 mg by mouth daily.    ? fluocinonide cream (LIDEX) 6.23 % Apply 1 application topically 2 (two) times daily.    ? folic acid (FOLVITE) 1 MG tablet TAKE 1 TABLET(1 MG) BY MOUTH DAILY 30  tablet 4  ? hydrocortisone cream 1 % Apply 1 application topically 2 (two) times daily. 453.6 g 0  ? ketoconazole (NIZORAL) 2 % cream Apply 1 application topically daily.    ? Krill Oil (OMEGA-3) 500 MG CAPS Take by mouth.    ? levothyroxine (SYNTHROID) 25 MCG tablet Take 1 tablet (25 mcg total) by mouth daily before breakfast. 30 tablet 2  ? lidocaine-prilocaine (EMLA) cream Apply 1 application topically as needed. 30 g 1  ? loratadine (CLARITIN) 10 MG tablet Take by mouth.    ? Magnesium 250 MG TABS Take by mouth.    ? Niacin (VITAMIN B-3 PO) Take 1 tablet by mouth daily.    ? omeprazole (PRILOSEC) 40 MG capsule Take 40 mg by mouth daily.    ? ondansetron (ZOFRAN-ODT) 4 MG disintegrating tablet Take 1 tablet (4 mg total)  by mouth every 8 (eight) hours as needed. 20 tablet 0  ? prochlorperazine (COMPAZINE) 10 MG tablet TAKE 1 TABLET(10 MG) BY MOUTH EVERY 6 HOURS AS NEEDED FOR NAUSEA OR VOMITING 30 tablet 0  ? triamcinolone cream (KENALOG) 0.1 % Apply 1 application topically daily as needed. 453 g 6  ? ?No current facility-administered medications for this encounter.  ? ? ? ?REVIEW OF SYSTEMS: On review of systems, the patient reports that since his increase in the dose of Dexamethasone, his symptoms of confusion have improved. He is still having some difficulty with memory and specifically with recalling people's names. He has had some trouble sleeping at night since his steroids. No other complaints are verbalized. He's working with the team in [place] to coordinate his simulation.  ? ? ?PHYSICAL EXAM:  ?Unable to assess due to encounter type. ? ? ? ?ECOG = 1 ? ?0 - Asymptomatic (Fully active, able to carry on all predisease activities without restriction) ? ?1 - Symptomatic but completely ambulatory (Restricted in physically strenuous activity but ambulatory and able to carry out work of a light or sedentary nature. For example, light housework, office work) ? ?2 - Symptomatic, <50% in bed during the day  (Ambulatory and capable of all self care but unable to carry out any work activities. Up and about more than 50% of waking hours) ? ?3 - Symptomatic, >50% in bed, but not bedbound (Capable of only limited self

## 2021-05-12 ENCOUNTER — Inpatient Hospital Stay (HOSPITAL_BASED_OUTPATIENT_CLINIC_OR_DEPARTMENT_OTHER): Payer: Medicare HMO | Admitting: Internal Medicine

## 2021-05-12 ENCOUNTER — Other Ambulatory Visit: Payer: Self-pay

## 2021-05-12 ENCOUNTER — Other Ambulatory Visit: Payer: Self-pay | Admitting: Radiation Therapy

## 2021-05-12 ENCOUNTER — Ambulatory Visit: Payer: Medicare HMO | Admitting: Internal Medicine

## 2021-05-12 VITALS — BP 129/79 | HR 62 | Temp 97.4°F | Resp 18 | Ht 73.0 in | Wt 181.4 lb

## 2021-05-12 DIAGNOSIS — C7931 Secondary malignant neoplasm of brain: Secondary | ICD-10-CM

## 2021-05-12 DIAGNOSIS — C782 Secondary malignant neoplasm of pleura: Secondary | ICD-10-CM | POA: Diagnosis not present

## 2021-05-12 DIAGNOSIS — Z5111 Encounter for antineoplastic chemotherapy: Secondary | ICD-10-CM | POA: Diagnosis present

## 2021-05-12 DIAGNOSIS — C3412 Malignant neoplasm of upper lobe, left bronchus or lung: Secondary | ICD-10-CM | POA: Diagnosis present

## 2021-05-12 DIAGNOSIS — Z79899 Other long term (current) drug therapy: Secondary | ICD-10-CM | POA: Diagnosis not present

## 2021-05-12 DIAGNOSIS — J910 Malignant pleural effusion: Secondary | ICD-10-CM | POA: Diagnosis not present

## 2021-05-12 DIAGNOSIS — Z5112 Encounter for antineoplastic immunotherapy: Secondary | ICD-10-CM | POA: Diagnosis present

## 2021-05-12 MED ORDER — DEXAMETHASONE 4 MG PO TABS: 4.0000 mg | ORAL_TABLET | Freq: Every day | ORAL | 1 refills | Status: DC

## 2021-05-12 NOTE — Progress Notes (Signed)
? ?[facility] [address]  ?[place], [place] [address] ?[phone] ? ? ?New Patient Evaluation ? ?Date of Service: 05/12/21 ?Patient Name: Syaire Saber ?Patient MRN: [id number] ?Patient DOB: 20-Apr-1940 ?Provider: Ventura Sellers, MD ? ?Identifying Statement:  ?Carman Essick is a 81 y.o. 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[facility]) who presents for initial consultation and evaluation regarding cancer associated neurologic deficits.   ? ?Referring Provider: ?[name], MD ?[address] ?[place] [address] ? ?Primary Cancer: ? ?Oncologic History: ?Oncology History  ?Adenocarcinoma, lung, left ([place])  ?08/04/2020 Initial Diagnosis  ? Adenocarcinoma of left lung, stage 4 ([place]) ? ?  ?08/04/2020 Cancer Staging  ? Staging form: Lung, AJCC 8th Edition ?- Clinical: Stage IVA (cT1b, cN2, cM1a) - Signed by Curt Bears, MD on 08/04/2020 ? ?  ?08/13/2020 -  Chemotherapy  ? Patient is on Treatment Plan : LUNG CARBOplatin / Pemetrexed / Pembrolizumab q21d Induction x 4 cycles / Maintenance Pemetrexed + Pembrolizumab  ? ?  ?  ? ?CNS Oncologic History ?08/25/20: SRS to L temporal metastasis [facility]) ? ?History of Present Illness: ?The patient's records from the referring physician were obtained and reviewed and the patient interviewed to confirm this HPI.  Oletta Darter presented to neurologic attention last week with new onset confusion.  His wife describes him forgetting names, choosing the wrong word, answering simple questions incorrectly.  Onset was over several days.  No seizure activity was witnessed.  No issues with his gait, no right sided weakness.  He denies headaches.  Decadron was started on 5/1 by rad onc, 4mg  twice per day.  He describes significant improvement, very close to his prior baseline, over the past 8 days.  He mowed his lawn this weekend, is otherwise independent with activities of living.  On Pem/Pem with Dr. Julien Nordmann, doing  well. ? ?Medications: ?Current Outpatient Medications on File Prior to Visit  ?Medication Sig Dispense Refill  ? aspirin 81 MG EC tablet Take by mouth.    ? Bacillus Coagulans-Inulin (PROBIOTIC) 1-250 BILLION-MG CAPS Take 1 tablet by mouth daily.    ? bisacodyl (DULCOLAX) 5 MG EC tablet Take 5 mg by mouth 3 (three) times daily as needed for moderate constipation.    ? Cholecalciferol (D3 PO) Take 1 tablet by mouth daily.    ? coal tar-salicylic acid 2 % shampoo Apply topically daily as needed for itching.    ? dexamethasone (DECADRON) 4 MG tablet Take 1 tablet (4 mg total) by mouth 2 (two) times daily. 90 tablet 1  ? DULoxetine (CYMBALTA) 30 MG capsule Take 30 mg by mouth daily.    ? fluocinonide cream (LIDEX) 9.35 % Apply 1 application topically 2 (two) times daily.    ? folic acid (FOLVITE) 1 MG tablet TAKE 1 TABLET(1 MG) BY MOUTH DAILY 30 tablet 4  ? hydrocortisone cream 1 % Apply 1 application topically 2 (two) times daily. 453.6 g 0  ? ketoconazole (NIZORAL) 2 % cream Apply 1 application topically daily.    ? Krill Oil (OMEGA-3) 500 MG CAPS Take by mouth.    ? levothyroxine (SYNTHROID) 25 MCG tablet Take 1 tablet (25 mcg total) by mouth daily before breakfast. 30 tablet 2  ? lidocaine-prilocaine (EMLA) cream Apply 1 application topically as needed. 30 g 1  ? loratadine (CLARITIN) 10 MG tablet Take by mouth.    ? Magnesium 250 MG TABS Take by mouth.    ? Niacin (VITAMIN B-3 PO) Take 1  tablet by mouth daily.    ? omeprazole (PRILOSEC) 40 MG capsule Take 40 mg by mouth daily.    ? ondansetron (ZOFRAN-ODT) 4 MG disintegrating tablet Take 1 tablet (4 mg total) by mouth every 8 (eight) hours as needed. 20 tablet 0  ? prochlorperazine (COMPAZINE) 10 MG tablet TAKE 1 TABLET(10 MG) BY MOUTH EVERY 6 HOURS AS NEEDED FOR NAUSEA OR VOMITING 30 tablet 0  ? triamcinolone cream (KENALOG) 0.1 % Apply 1 application topically daily as needed. 453 g 6  ? ?No current facility-administered medications on file prior to visit.   ? ? ?Allergies: No Known Allergies ?Past Medical History:  ?Past Medical History:  ?Diagnosis Date  ? Lung cancer ([place])   ? 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 ?Past Surgical History:  ?Past Surgical History:  ?Procedure Laterality Date  ? IR IMAGING GUIDED PORT INSERTION  11/24/2020  ? KNEE SURGERY    ? orthoscopic    ? TONSILLECTOMY AND ADENOIDECTOMY    ? ?Social History:  ?Social History  ? ?Socioeconomic History  ? Marital status: Married  ?  Spouse name: Not on file  ? Number of children: Not on file  ? Years of education: Not on file  ? Highest education level: Not on file  ?Occupational History  ? Not on file  ?Tobacco Use  ? Smoking status: Never  ? Smokeless tobacco: Never  ?Substance and Sexual Activity  ? Alcohol use: Never  ? Drug use: Never  ? Sexual activity: Not on file  ?Other Topics Concern  ? Not on file  ?Social History Narrative  ? Not on file  ? ?Social Determinants of Health  ? ?Financial Resource Strain: Low Risk   ? Difficulty of Paying Living Expenses: Not very hard  ?Food Insecurity: No Food Insecurity  ? Worried About Charity fundraiser in the Last Year: Never true  ? Ran Out of Food in the Last Year: Never true  ?Transportation Needs: No Transportation Needs  ? Lack of Transportation (Medical): No  ? Lack of Transportation (Non-Medical): No  ?Physical Activity: Not on file  ?Stress: Stress Concern Present  ? Feeling of Stress : To some extent  ?Social Connections: Socially Integrated  ? Frequency of Communication with Friends and Family: More than three times a week  ? Frequency of Social Gatherings with Friends and Family: Once a week  ? Attends Religious Services: More than 4 times per year  ? Active Member of Clubs or Organizations: Yes  ? Attends Archivist Meetings: More than 4 times per year  ? Marital Status: Married  ?Intimate Partner Violence: Not on file  ? ?Family History: No family history on file. ? ?Review of Systems: ?Constitutional: Doesn't report fevers,  chills or abnormal weight loss ?Eyes: Doesn't report blurriness of vision ?Ears, nose, mouth, throat, and face: Doesn't report sore throat ?Respiratory: Doesn't report cough, dyspnea or wheezes ?Cardiovascular: Doesn't report palpitation, chest discomfort  ?Gastrointestinal:  Doesn't report nausea, constipation, diarrhea ?GU: Doesn't report incontinence ?Skin: Doesn't report skin rashes ?Neurological: Per HPI ?Musculoskeletal: Doesn't report joint pain ?Behavioral/Psych: Doesn't report anxiety ? ?Physical Exam: ?Vitals:  ? 05/12/21 0932  ?BP: 129/79  ?Pulse: 62  ?Resp: 18  ?Temp: (!) 97.4 ?F (36.3 ?C)  ?SpO2: 98%  ? ?KPS: 90. ?General: Alert, cooperative, pleasant, in no acute distress ?Head: Normal ?EENT: No conjunctival injection or scleral icterus.  ?Lungs: Resp effort normal ?Cardiac: Regular rate ?Abdomen: Non-distended abdomen ?Skin: No rashes cyanosis or  petechiae. ?Extremities: No clubbing or edema ? ?Neurologic Exam: ?Mental Status: Awake, alert, attentive to examiner. Oriented to self and environment. Language is fluent with intact comprehension.  ?Cranial Nerves: Visual acuity is grossly normal. Visual fields are full. Extra-ocular movements intact. No ptosis. Face is symmetric ?Motor: Tone and bulk are normal. Power is full in both arms and legs. Reflexes are symmetric, no pathologic reflexes present.  ?Sensory: Intact to light touch ?Gait: Normal. ? ? ?Labs: ?I have reviewed the data as listed ?   ?Component Value Date/Time  ? NA 124 (L) 04/27/2021 1013  ? K 5.0 04/27/2021 1013  ? CL 93 (L) 04/27/2021 1013  ? CO2 28 04/27/2021 1013  ? GLUCOSE 91 04/27/2021 1013  ? BUN 12 04/27/2021 1013  ? CREATININE 0.93 04/27/2021 1013  ? CALCIUM 8.9 04/27/2021 1013  ? PROT 6.4 (L) 04/27/2021 1013  ? ALBUMIN 3.8 04/27/2021 1013  ? AST 21 04/27/2021 1013  ? ALT 9 04/27/2021 1013  ? ALKPHOS 80 04/27/2021 1013  ? BILITOT 0.4 04/27/2021 1013  ? GFRNONAA >60 04/27/2021 1013  ? ?Lab Results  ?Component Value Date  ? WBC 6.3  04/27/2021  ? NEUTROABS 3.3 04/27/2021  ? HGB 11.6 (L) 04/27/2021  ? HCT 32.1 (L) 04/27/2021  ? MCV 92.8 04/27/2021  ? PLT 218 04/27/2021  ? ? ?Imaging: ? ?CT Chest W Contrast ? ?Result Date: 04/24/2021 ?CLINICAL DATA:  Non-small-cel

## 2021-05-13 ENCOUNTER — Other Ambulatory Visit: Payer: Self-pay | Admitting: Radiation Therapy

## 2021-05-13 ENCOUNTER — Telehealth: Payer: Self-pay | Admitting: Internal Medicine

## 2021-05-13 NOTE — Telephone Encounter (Signed)
Per 5/9 los called and spoke to pt wife about telephone visit and in person visit   pt wife confirmed appointment  ?

## 2021-05-14 ENCOUNTER — Other Ambulatory Visit: Payer: Self-pay | Admitting: *Deleted

## 2021-05-14 DIAGNOSIS — Z95828 Presence of other vascular implants and grafts: Secondary | ICD-10-CM

## 2021-05-15 ENCOUNTER — Inpatient Hospital Stay: Admission: RE | Admit: 2021-05-15 | Payer: Medicare HMO | Source: Ambulatory Visit

## 2021-05-18 ENCOUNTER — Ambulatory Visit: Payer: Self-pay | Admitting: Radiation Oncology

## 2021-05-19 ENCOUNTER — Inpatient Hospital Stay (HOSPITAL_BASED_OUTPATIENT_CLINIC_OR_DEPARTMENT_OTHER): Payer: Medicare HMO | Admitting: Internal Medicine

## 2021-05-19 ENCOUNTER — Inpatient Hospital Stay: Payer: Medicare HMO

## 2021-05-19 ENCOUNTER — Encounter: Payer: Self-pay | Admitting: Internal Medicine

## 2021-05-19 VITALS — BP 125/73 | HR 61 | Temp 97.8°F | Resp 17 | Wt 185.1 lb

## 2021-05-19 DIAGNOSIS — Z5112 Encounter for antineoplastic immunotherapy: Secondary | ICD-10-CM | POA: Diagnosis not present

## 2021-05-19 DIAGNOSIS — Z95828 Presence of other vascular implants and grafts: Secondary | ICD-10-CM

## 2021-05-19 DIAGNOSIS — C3492 Malignant neoplasm of unspecified part of left bronchus or lung: Secondary | ICD-10-CM | POA: Diagnosis not present

## 2021-05-19 LAB — CMP (CANCER CENTER ONLY)
ALT: 16 U/L (ref 0–44)
AST: 18 U/L (ref 15–41)
Albumin: 3.7 g/dL (ref 3.5–5.0)
Alkaline Phosphatase: 79 U/L (ref 38–126)
Anion gap: 4 — ABNORMAL LOW (ref 5–15)
BUN: 19 mg/dL (ref 8–23)
CO2: 27 mmol/L (ref 22–32)
Calcium: 8.6 mg/dL — ABNORMAL LOW (ref 8.9–10.3)
Chloride: 92 mmol/L — ABNORMAL LOW (ref 98–111)
Creatinine: 0.96 mg/dL (ref 0.61–1.24)
GFR, Estimated: >60 mL/min (ref >60)
Glucose, Bld: 76 mg/dL (ref 70–99)
Potassium: 4.7 mmol/L (ref 3.5–5.1)
Sodium: 123 mmol/L — ABNORMAL LOW (ref 135–145)
Total Bilirubin: 0.5 mg/dL (ref 0.3–1.2)
Total Protein: 6 g/dL — ABNORMAL LOW (ref 6.5–8.1)

## 2021-05-19 LAB — CBC WITH DIFFERENTIAL (CANCER CENTER ONLY)
Abs Immature Granulocytes: 0.14 10*3/uL — ABNORMAL HIGH (ref 0.00–0.07)
Basophils Absolute: 0 10*3/uL (ref 0.0–0.1)
Basophils Relative: 0 %
Eosinophils Absolute: 0.1 10*3/uL (ref 0.0–0.5)
Eosinophils Relative: 1 %
HCT: 32.8 % — ABNORMAL LOW (ref 39.0–52.0)
Hemoglobin: 11.9 g/dL — ABNORMAL LOW (ref 13.0–17.0)
Immature Granulocytes: 2 %
Lymphocytes Relative: 13 %
Lymphs Abs: 1 10*3/uL (ref 0.7–4.0)
MCH: 34 pg (ref 26.0–34.0)
MCHC: 36.3 g/dL — ABNORMAL HIGH (ref 30.0–36.0)
MCV: 93.7 fL (ref 80.0–100.0)
Monocytes Absolute: 0.9 10*3/uL (ref 0.1–1.0)
Monocytes Relative: 11 %
Neutro Abs: 5.6 10*3/uL (ref 1.7–7.7)
Neutrophils Relative %: 73 %
Platelet Count: 183 10*3/uL (ref 150–400)
RBC: 3.5 MIL/uL — ABNORMAL LOW (ref 4.22–5.81)
RDW: 13.7 % (ref 11.5–15.5)
WBC Count: 7.7 10*3/uL (ref 4.0–10.5)
nRBC: 0 % (ref 0.0–0.2)

## 2021-05-19 LAB — TSH: TSH: 1.206 u[IU]/mL (ref 0.350–4.500)

## 2021-05-19 MED ORDER — SODIUM CHLORIDE 0.9% FLUSH
10.0000 mL | INTRAVENOUS | Status: DC | PRN
  Administered 2021-05-19: 10 mL

## 2021-05-19 MED ORDER — HEPARIN SOD (PORK) LOCK FLUSH 100 UNIT/ML IV SOLN
500.0000 [IU] | Freq: Once | INTRAVENOUS | Status: AC | PRN
  Administered 2021-05-19: 500 [IU]

## 2021-05-19 MED ORDER — SODIUM CHLORIDE 0.9% FLUSH
10.0000 mL | Freq: Once | INTRAVENOUS | Status: AC
  Administered 2021-05-19: 10 mL

## 2021-05-19 MED ORDER — PROCHLORPERAZINE MALEATE 10 MG PO TABS
10.0000 mg | ORAL_TABLET | Freq: Once | ORAL | Status: AC
  Administered 2021-05-19: 10 mg via ORAL
  Filled 2021-05-19: qty 1

## 2021-05-19 MED ORDER — SODIUM CHLORIDE 0.9 % IV SOLN
500.0000 mg/m2 | Freq: Once | INTRAVENOUS | Status: AC
  Administered 2021-05-19: 1000 mg via INTRAVENOUS
  Filled 2021-05-19: qty 40

## 2021-05-19 MED ORDER — SODIUM CHLORIDE 0.9 % IV SOLN
200.0000 mg | Freq: Once | INTRAVENOUS | Status: AC
  Administered 2021-05-19: 200 mg via INTRAVENOUS
  Filled 2021-05-19: qty 200

## 2021-05-19 MED ORDER — SODIUM CHLORIDE 0.9 % IV SOLN: Freq: Once | INTRAVENOUS | Status: AC

## 2021-05-19 MED ORDER — SODIUM CHLORIDE 0.9 % IV SOLN: Freq: Once | INTRAVENOUS | Status: DC

## 2021-05-19 NOTE — Patient Instructions (Signed)
[facility]  Discharge Instructions: ?Thank you for choosing [facility] to provide your oncology and hematology care.  ? ?If you have a lab appointment with the [facility], please go directly to the [facility] and check in at the registration area. ?  ?Wear comfortable clothing and clothing appropriate for easy access to any Portacath or PICC line.  ? ?We strive to give you quality time with your provider. You may need to reschedule your appointment if you arrive late (15 or more minutes).  Arriving late affects you and other patients whose appointments are after yours.  Also, if you miss three or more appointments without notifying the office, you may be dismissed from the clinic at the provider?s discretion.    ?  ?For prescription refill requests, have your pharmacy contact our office and allow 72 hours for refills to be completed.   ? ?Today you received the following chemotherapy and/or immunotherapy agents: Pemetrexed, Pembrolizumab.    ?  ?To help prevent nausea and vomiting after your treatment, we encourage you to take your nausea medication as directed. ? ?BELOW ARE SYMPTOMS THAT SHOULD BE REPORTED IMMEDIATELY: ?*FEVER GREATER THAN 100.4 F (38 ?C) OR HIGHER ?*CHILLS OR SWEATING ?*NAUSEA AND VOMITING THAT IS NOT CONTROLLED WITH YOUR NAUSEA MEDICATION ?*UNUSUAL SHORTNESS OF BREATH ?*UNUSUAL BRUISING OR BLEEDING ?*URINARY PROBLEMS (pain or burning when urinating, or frequent urination) ?*BOWEL PROBLEMS (unusual diarrhea, constipation, pain near the anus) ?TENDERNESS IN MOUTH AND THROAT WITH OR WITHOUT PRESENCE OF ULCERS (sore throat, sores in mouth, or a toothache) ?UNUSUAL RASH, SWELLING OR PAIN  ?UNUSUAL VAGINAL DISCHARGE OR ITCHING  ? ?Items with * indicate a potential emergency and should be followed up as soon as possible or go to the Emergency Department if any problems should occur. ? ?Please show the CHEMOTHERAPY ALERT CARD or IMMUNOTHERAPY ALERT  CARD at check-in to the Emergency Department and triage nurse. ? ?Should you have questions after your visit or need to cancel or reschedule your appointment, please contact [facility]  Dept: [phone]  and follow the prompts.  Office hours are 8:00 a.m. to 4:30 p.m. Monday - Friday. Please note that voicemails left after 4:00 p.m. may not be returned until the following business day.  We are closed weekends and major holidays. You have access to a nurse at all times for urgent questions. Please call the main number to the clinic Dept: [phone] and follow the prompts. ? ? ?For any non-urgent questions, you may also contact your provider using MyChart. We now offer e-Visits for anyone 93 and older to request care online for non-urgent symptoms. For details visit [email or web address]. ?  ?Also download the MyChart app! Go to the app store, search "MyChart", open the app, select [facility], and log in with your MyChart username and password. ? ?Due to Covid, a mask is required upon entering the hospital/clinic. If you do not have a mask, one will be given to you upon arrival. For doctor visits, patients may have 1 support person aged 30 or older with them. For treatment visits, patients cannot have anyone with them due to current Covid guidelines and our immunocompromised population.  ? ?

## 2021-05-19 NOTE — Progress Notes (Signed)
?    [facility] ?[phone]   [phone] ? ?OFFICE PROGRESS NOTE ? ?Eason, Paul, MD ?[address] ?[place] [address] ? ?DIAGNOSIS: Stage IV (T1b, N2, M1a) non-small cell lung cancer, adenocarcinoma diagnosed in July 2022 and presented with left upper lobe nodule in addition to AP window lymphadenopathy and left-sided malignant pleural effusion as well as pleural metastatic disease.  The patient also has solitary left temporal brain metastasis. ? ?Biomarker Findings ?Microsatellite status - MS-Stable ?Tumor Mutational Burden - 4 Muts/Mb ?Genomic Findings ?For a complete list of the genes assayed, please refer to the Appendix. ?PIK3CA E545K ?PTEN splice site 635-1G>A - subclonal? ?DOT1L S911L - subclonal? ?RAD21 S271* ?RB1 loss exons 3-23 ?TP53 N311fs*34 ?8 Disease relevant genes with no reportable ?alterations: ALK, BRAF, EGFR, ERBB2, KRAS, MET, RET, ?ROS1 ? ?PRIOR THERAPY: He is expected to start palliative radiotherapy to the enlarging left upper lobe lung mass at the [place] by radiation oncology. ? ?CURRENT THERAPY: Systemic chemotherapy with carboplatin for AUC of 5, Alimta 500 Mg/M2 and Keytruda 200 Mg IV every 3 weeks.  First dose August 13, 2020.  Status post 13 cycles.  Starting from cycle #5 he is on maintenance treatment with Alimta and Keytruda every 3 weeks. ? ?INTERVAL HISTORY: ?Peter Brown 81 y.o. male returns to the clinic today for follow-up visit.  The patient is feeling fine today with no concerning complaints.  The patient was found recently to have enlarging size of the solitary brain metastasis.  He was seen by Dr. Vaslow and it was suspicious for tumor necrosis and he started treatment with Decadron currently on 4 mg p.o. daily.  He is feeling much better but has lack of sleep from the steroids.  He denied having any current chest pain, shortness of breath, cough or hemoptysis.  He has no nausea, vomiting, diarrhea or constipation.  He  has no headache or visual changes.  He continues to tolerate his systemic chemotherapy fairly well.  The patient is here today for evaluation before starting cycle #14. ? ?MEDICAL HISTORY: ?Past Medical History:  ?Diagnosis Date  ? Lung cancer (HCC)   ? Lung cancer metastatic to brain (HCC)   ? ? ?ALLERGIES:  has No Known Allergies. ? ?MEDICATIONS:  ?Current Outpatient Medications  ?Medication Sig Dispense Refill  ? aspirin 81 MG EC tablet Take by mouth.    ? Bacillus Coagulans-Inulin (PROBIOTIC) 1-250 BILLION-MG CAPS Take 1 tablet by mouth daily.    ? bisacodyl (DULCOLAX) 5 MG EC tablet Take 5 mg by mouth 3 (three) times daily as needed for moderate constipation.    ? Cholecalciferol (D3 PO) Take 1 tablet by mouth daily.    ? coal tar-salicylic acid 2 % shampoo Apply topically daily as needed for itching.    ? dexamethasone (DECADRON) 4 MG tablet Take 1 tablet (4 mg total) by mouth daily. 90 tablet 1  ? DULoxetine (CYMBALTA) 30 MG capsule Take 30 mg by mouth daily.    ? fluocinonide cream (LIDEX) 0.05 % Apply 1 application topically 2 (two) times daily.    ? folic acid (FOLVITE) 1 MG tablet TAKE 1 TABLET(1 MG) BY MOUTH DAILY 30 tablet 4  ? hydrocortisone cream 1 % Apply 1 application topically 2 (two) times daily. 453.6 g 0  ? ketoconazole (NIZORAL) 2 % cream Apply 1 application topically daily.    ? Krill Oil (OMEGA-3) 500 MG CAPS Take by mouth.    ? levothyroxine (SYNTHROID) 25 MCG tablet Take   1 tablet (25 mcg total) by mouth daily before breakfast. 30 tablet 2  ? lidocaine-prilocaine (EMLA) cream Apply 1 application topically as needed. 30 g 1  ? loratadine (CLARITIN) 10 MG tablet Take by mouth.    ? Magnesium 250 MG TABS Take by mouth.    ? Niacin (VITAMIN B-3 PO) Take 1 tablet by mouth daily.    ? omeprazole (PRILOSEC) 40 MG capsule Take 40 mg by mouth daily.    ? ondansetron (ZOFRAN-ODT) 4 MG disintegrating tablet Take 1 tablet (4 mg total) by mouth every 8 (eight) hours as needed. 20 tablet 0  ?  prochlorperazine (COMPAZINE) 10 MG tablet TAKE 1 TABLET(10 MG) BY MOUTH EVERY 6 HOURS AS NEEDED FOR NAUSEA OR VOMITING 30 tablet 0  ? triamcinolone cream (KENALOG) 0.1 % Apply 1 application topically daily as needed. 453 g 6  ? ?No current facility-administered medications for this visit.  ? ? ?SURGICAL HISTORY:  ?Past Surgical History:  ?Procedure Laterality Date  ? IR IMAGING GUIDED PORT INSERTION  11/24/2020  ? KNEE SURGERY    ? orthoscopic    ? TONSILLECTOMY AND ADENOIDECTOMY    ? ? ?REVIEW OF SYSTEMS:  A comprehensive review of systems was negative except for: Constitutional: positive for fatigue ?Behavioral/Psych: positive for sleep disturbance  ? ?PHYSICAL EXAMINATION: General appearance: alert, cooperative, fatigued, and no distress ?Head: Normocephalic, without obvious abnormality, atraumatic ?Neck: no adenopathy, no JVD, supple, symmetrical, trachea midline, and thyroid not enlarged, symmetric, no tenderness/mass/nodules ?Lymph nodes: Cervical, supraclavicular, and axillary nodes normal. ?Resp: clear to auscultation bilaterally ?Back: symmetric, no curvature. ROM normal. No CVA tenderness. ?Cardio: regular rate and rhythm, S1, S2 normal, no murmur, click, rub or gallop ?GI: soft, non-tender; bowel sounds normal; no masses,  no organomegaly ?Extremities: extremities normal, atraumatic, no cyanosis or edema ? ?ECOG PERFORMANCE STATUS: 1 - Symptomatic but completely ambulatory ? ?Blood pressure 125/73, pulse 61, temperature 97.8 ?F (36.6 ?C), temperature source Oral, resp. rate 17, weight 185 lb 1 oz (83.9 kg), SpO2 100 %. ? ?LABORATORY DATA: ?Lab Results  ?Component Value Date  ? WBC 7.7 05/19/2021  ? HGB 11.9 (L) 05/19/2021  ? HCT 32.8 (L) 05/19/2021  ? MCV 93.7 05/19/2021  ? PLT 183 05/19/2021  ? ? ?  Chemistry   ?   ?Component Value Date/Time  ? NA 123 (L) 05/19/2021 1001  ? K 4.7 05/19/2021 1001  ? CL 92 (L) 05/19/2021 1001  ? CO2 27 05/19/2021 1001  ? BUN 19 05/19/2021 1001  ? CREATININE 0.96 05/19/2021  1001  ?    ?Component Value Date/Time  ? CALCIUM 8.6 (L) 05/19/2021 1001  ? ALKPHOS 79 05/19/2021 1001  ? AST 18 05/19/2021 1001  ? ALT 16 05/19/2021 1001  ? BILITOT 0.5 05/19/2021 1001  ?  ? ? ? ?RADIOGRAPHIC STUDIES: ?CT Chest W Contrast ? ?Result Date: 04/24/2021 ?CLINICAL DATA:  Non-small-cell lung cancer. Diagnosed in August of 2022. Brain metastasis. Radiation therapy to brain complete. On immunotherapy. On chemotherapy. Left chest pain. Shortness of breath on exertion. * Tracking Code: BO * EXAM: CT CHEST, ABDOMEN, AND PELVIS WITH CONTRAST TECHNIQUE: Multidetector CT imaging of the chest, abdomen and pelvis was performed following the standard protocol during bolus administration of intravenous contrast. RADIATION DOSE REDUCTION: This exam was performed according to the departmental dose-optimization program which includes automated exposure control, adjustment of the mA and/or kV according to patient size and/or use of iterative reconstruction technique. CONTRAST:  148m OMNIPAQUE IOHEXOL 300 MG/ML  SOLN COMPARISON:  02/19/2021 FINDINGS: CT CHEST FINDINGS Cardiovascular: Right Port-A-Cath tip high right atrium. Aortic atherosclerosis. Tortuous thoracic aorta. Normal heart size, without pericardial effusion. Lad coronary artery calcification. No central pulmonary embolism, on this non-dedicated study. Mediastinum/Nodes: No supraclavicular adenopathy. Small nodes within the azygoesophageal recess are similar and not pathologic by size criteria. No hilar adenopathy. Lungs/Pleura: No pleural fluid. Anterior left upper lobe pulmonary nodule measures 1.3 x 1.0 cm on 75/4, increased from 6 mm on the prior exam. Subpleural lymph nodes along the left major fissure are unchanged including at up to 7 mm. Similar subpleural ground-glass, reticulation, and traction bronchiolectasis, basilar predominant. Musculoskeletal: No acute osseous abnormality. CT ABDOMEN PELVIS FINDINGS Hepatobiliary: Normal liver. Normal  gallbladder, without biliary ductal dilatation. Pancreas: Normal, without mass or ductal dilatation. Spleen: Normal in size, without focal abnormality. Adrenals/Urinary Tract: Normal adrenal glands. Interpolar left renal

## 2021-05-28 ENCOUNTER — Inpatient Hospital Stay (HOSPITAL_BASED_OUTPATIENT_CLINIC_OR_DEPARTMENT_OTHER): Payer: Medicare HMO | Admitting: Internal Medicine

## 2021-05-28 DIAGNOSIS — C7931 Secondary malignant neoplasm of brain: Secondary | ICD-10-CM

## 2021-05-28 MED ORDER — DEXAMETHASONE 1 MG PO TABS: ORAL_TABLET | ORAL | 0 refills | Status: AC

## 2021-05-28 NOTE — Progress Notes (Signed)
I connected with Peter Brown on 05/28/21 at 10:30 AM EDT by telephone visit and verified that I am speaking with the correct person using two identifiers.  I discussed the limitations, risks, security and privacy concerns of performing an evaluation and management service by telemedicine and the availability of in-person appointments. I also discussed with the patient that there may be a patient responsible charge related to this service. The patient expressed understanding and agreed to proceed.  Other persons participating in the visit and their role in the encounter:  n/a  Patient's location:  Home  Provider's location:  Office  Chief Complaint:  Malignant neoplasm metastatic to brain [facility])  History of Present Ilness: Peter Brown describes no changes today.  Continues to function his prior baseline.  Steroids do clearly interfere with his sleep, however. Observations: Language and cognition at baseline Assessment and Plan: Malignant neoplasm metastatic to brain [facility])  Clinically stable today.    Decadron should decrease by 1mg  each week until he return to clinic in 3 week.  Follow Up Instructions: RTC following MRI brain  I discussed the assessment and treatment plan with the patient.  The patient was provided an opportunity to ask questions and all were answered.  The patient agreed with the plan and demonstrated understanding of the instructions.    The patient was advised to call back or seek an in-person evaluation if the symptoms worsen or if the condition fails to improve as anticipated.  I provided 5-10 minutes of non-face-to-face time during this enocunter.  Ventura Sellers, MD   I provided 15 minutes of non face-to-face telephone visit time during this encounter, and > 50% was spent counseling as documented under my assessment & plan.

## 2021-05-29 ENCOUNTER — Inpatient Hospital Stay: Admission: RE | Admit: 2021-05-29 | Payer: Medicare HMO | Source: Ambulatory Visit

## 2021-06-02 ENCOUNTER — Telehealth: Payer: Self-pay | Admitting: Internal Medicine

## 2021-06-02 NOTE — Telephone Encounter (Signed)
Called patient regarding upcoming appointments, spoke with patient's spouse. Patient will be notified.

## 2021-06-03 NOTE — Progress Notes (Signed)
[facility] OFFICE PROGRESS NOTE  Peter Hong, MD [address] [place] [address]  DIAGNOSIS: Stage IV (T1b, N2, M1a) non-small cell lung cancer, adenocarcinoma diagnosed in July 2022 and presented with left upper lobe nodule in addition to AP window lymphadenopathy and left-sided malignant pleural effusion as well as pleural metastatic disease.  The patient also has solitary left temporal brain metastasis.   Biomarker Findings Microsatellite status - MS-Stable Tumor Mutational Burden - 4 Muts/Mb Genomic Findings For a complete list of the genes assayed, please refer to the Appendix. KPT4SF K812X PTEN splice site [id number]>[id number] - subclonal? DOT1L S911L - subclonal? RAD21 S271* RB1 loss exons 3-23 TP53 N346f*34 8 Disease relevant genes with no reportable alterations: ALK, BRAF, EGFR, ERBB2, KRAS, MET, RET, ROS1  PRIOR THERAPY: SRS to the brain completed on 09/03/2020  CURRENT THERAPY:  1) Systemic chemotherapy with carboplatin for AUC of 5, Alimta 500 Mg/M2 and Keytruda 200 Mg IV every 3 weeks.  First dose August 13, 2020.  Status post 14 cycles.  Starting from cycle #5 he is on maintenance treatment with Alimta and Keytruda every 3 weeks. 2) [facility].   INTERVAL HISTORY: Peter Romas89y.o. male returns to the clinic today for a follow-up visit accompanied by his wife.  The patient was last seen in clinic 3 weeks ago.  The patient is currently undergoing maintenance immunotherapy and chemotherapy.  He is tolerating this fairly well except for some fatigue 4 to 5 days following treatment.  The patient was recently found to have an enlarging solitary brain metastasis.  He is scheduled for repeat brain MRI in 06/12/2021.  He is currently undergoing treatment with Decadron which has helped. He is taking 1 tablet per day. [place] enlarging left upper lobe lung nodule that was seen on his most recent restaging CT scan of the chest, abdomen, pelvis on 04/23/2021.  He is expecting a call from their office today or tomorrow to schedule this.  Otherwise the patient is feeling fairly well today.  He denies any fever, chills, night sweats, or weight loss.  He denies any chest pain, cough, or hemoptysis.  He reports ever since having COVID-19 a few months ago, he has had some gradually worsening dyspnea on exertion.  [facility]  He was wondering how to go about getting a referral.  Denies any nausea, vomiting, or diarrhea.  He uses Dulcolax for constipation.  He denies any current headaches or visual changes.  Denies any rashes or skin changes.  He is here today for evaluation and repeat blood work before undergoing cycle #15.   MEDICAL HISTORY: Past Medical History:  Diagnosis Date   [place]    L[facility] cancer [facility] brain ([facility]     ALLERGIES:  has No Known Allergies.  MEDICATIONS:  Current Outpatient Medications  Medication Sig Dispense Refill   aspirin 81 MG EC tablet Take by mouth.     Bacillus Coagulans-Inulin (PROBIOTIC) 1-250 BILLION-MG CAPS Take 1 tablet by mouth daily.     bisacodyl (DULCOLAX) 5 MG EC tablet Take 5 mg by mouth 3 (three) times daily as needed for moderate constipation.     Cholecalciferol (D3 PO) Take 1 tablet by mouth daily.  coal tar-salicylic acid 2 % shampoo Apply topically daily as needed for itching.     dexamethasone (DECADRON) 1 MG tablet Take 3 tablets (3 mg total) by mouth daily with breakfast for 7 days, THEN 2 tablets (2 mg total) daily with breakfast for 7 days, THEN 1 tablet (1 mg total) daily with breakfast for 7 days. 42 tablet 0   DULoxetine (CYMBALTA) 30 MG capsule Take 30 mg by mouth daily.     fluocinonide cream (LIDEX)  5.32 % Apply 1 application topically 2 (two) times daily.     folic acid (FOLVITE) 1 MG tablet TAKE 1 TABLET(1 MG) BY MOUTH DAILY 30 tablet 4   hydrocortisone cream 1 % Apply 1 application topically 2 (two) times daily. 453.6 g 0   ketoconazole (NIZORAL) 2 % cream Apply 1 application topically daily.     Krill Oil (OMEGA-3) 500 MG CAPS Take by mouth.     levothyroxine (SYNTHROID) 25 MCG tablet Take 1 tablet (25 mcg total) by mouth daily before breakfast. 30 tablet 2   lidocaine-prilocaine (EMLA) cream Apply 1 application topically as needed. 30 g 1   loratadine (CLARITIN) 10 MG tablet Take by mouth.     Magnesium 250 MG TABS Take by mouth.     Niacin (VITAMIN B-3 PO) Take 1 tablet by mouth daily.     omeprazole (PRILOSEC) 40 MG capsule Take 40 mg by mouth daily.     ondansetron (ZOFRAN-ODT) 4 MG disintegrating tablet Take 1 tablet (4 mg total) by mouth every 8 (eight) hours as needed. 20 tablet 0   prochlorperazine (COMPAZINE) 10 MG tablet TAKE 1 TABLET(10 MG) BY MOUTH EVERY 6 HOURS AS NEEDED FOR NAUSEA OR VOMITING 30 tablet 0   triamcinolone cream (KENALOG) 0.1 % Apply 1 application topically daily as needed. 453 g 6   No current facility-administered medications for this visit.    SURGICAL HISTORY:  Past Surgical History:  Procedure Laterality Date   IR IMAGING GUIDED PORT INSERTION  11/24/2020   KNEE SURGERY     orthoscopic     TONSILLECTOMY AND ADENOIDECTOMY      REVIEW OF SYSTEMS:   Review of Systems  Constitutional: Negative for appetite change, chills, fatigue, fever and unexpected weight change.  HENT:   Negative for mouth sores, nosebleeds, sore throat and trouble swallowing.   Eyes: Negative for eye problems and icterus.  Respiratory: Positive for gradually worsening shortness of breath with exertion.  Negative for cough, hemoptysis, and wheezing.   Cardiovascular: Negative for chest pain and leg swelling.  Gastrointestinal: Negative for abdominal pain, constipation,  diarrhea, nausea and vomiting.  Genitourinary: Negative for bladder incontinence, difficulty urinating, dysuria, frequency and hematuria.   Musculoskeletal: Negative for back pain, gait problem, neck pain and neck stiffness.  Skin: Negative for itching and rash.  Neurological: Negative for dizziness, extremity weakness, gait problem, headaches, light-headedness and seizures.  Hematological: Negative for adenopathy. Does not bruise/bleed easily.  Psychiatric/Behavioral: Negative for confusion, depression and sleep disturbance. The patient is not nervous/anxious.     PHYSICAL EXAMINATION:  There were no vitals taken for this visit.  ECOG PERFORMANCE STATUS: 1  Physical Exam  Constitutional: Oriented to person, place, and time and well-developed, well-nourished, and in no distress.  HENT:  Head: Normocephalic and atraumatic.  Mouth/Throat: Oropharynx is clear and moist. No oropharyngeal exudate.  Eyes: Conjunctivae are normal. Right eye exhibits no discharge. Left eye exhibits no discharge. No scleral icterus.  Neck: Normal range of motion. Neck supple.  Cardiovascular:  Normal rate, regular rhythm, normal heart sounds and intact distal pulses.   Pulmonary/Chest: Effort normal and breath sounds normal. No respiratory distress. No wheezes. No rales.  Abdominal: Soft. Bowel sounds are normal. Exhibits no distension and no mass. There is no tenderness.  Musculoskeletal: Normal range of motion. Exhibits no edema.  Lymphadenopathy:    No cervical adenopathy.  Neurological: Alert and oriented to person, place, and time. Exhibits normal muscle tone. Gait normal. Coordination normal.  Skin: Skin is warm and dry. No rash noted. Not diaphoretic. No erythema. No pallor.  Psychiatric: Mood, memory and judgment normal.  Vitals reviewed.  LABORATORY DATA: Lab Results  Component Value Date   WBC 7.7 05/19/2021   HGB 11.9 (L) 05/19/2021   HCT 32.8 (L) 05/19/2021   MCV 93.7 05/19/2021   PLT 183  05/19/2021      Chemistry      Component Value Date/Time   NA 123 (L) 05/19/2021 1001   K 4.7 05/19/2021 1001   CL 92 (L) 05/19/2021 1001   CO2 27 05/19/2021 1001   BUN 19 05/19/2021 1001   CREATININE 0.96 05/19/2021 1001      Component Value Date/Time   CALCIUM 8.6 (L) 05/19/2021 1001   ALKPHOS 79 05/19/2021 1001   AST 18 05/19/2021 1001   ALT 16 05/19/2021 1001   BILITOT 0.5 05/19/2021 1001       RADIOGRAPHIC STUDIES:  MR Brain W Wo Contrast  Result Date: 05/07/2021 CLINICAL DATA:  Brain metastases EXAM: MRI HEAD WITHOUT AND WITH CONTRAST TECHNIQUE: Multiplanar, multiecho pulse sequences of the brain and surrounding structures were obtained without and with intravenous contrast. CONTRAST:  37m MULTIHANCE GADOBENATE DIMEGLUMINE 529 MG/ML IV SOLN COMPARISON:  Brain MRI 03/12/2021 12/11/2020 FINDINGS: Brain: The enhancing lesion in the left anterior temporal lobe continues to increase in size, currently measuring 2.0 cm AP x 1.8 cm TV, previously measured 1.2 cm AP x 1.0 cm TV. Small foci of SWI signal dropout are consistent with small amount of intralesional hemorrhage. Edema surrounding the lesion throughout the left temporal lobe has markedly worsened since 03/12/2021. There is no midline shift. No new enhancing lesions are identified. There is no acute intracranial hemorrhage, extra-axial fluid collection, or acute infarct. The ventricles are stable in size. Parenchymal signal is otherwise essentially normal, with no significant burden of underlying white matter microangiopathic change. Vascular: Normal flow voids. Skull and upper cervical spine: Normal marrow signal. Sinuses/Orbits: There is trace mucosal thickening in the paranasal sinuses. The globes and orbits are unremarkable. Other: None. IMPRESSION: 1. The enhancing lesion in the left temporal lobe continues to increase in size, with significantly increased surrounding FLAIR signal abnormality. No midline shift. 2. No new lesions  identified. Electronically Signed   By: PValetta MoleM.D.   On: 05/07/2021 16:41     ASSESSMENT/PLAN:  This is a very pleasant 81year old Caucasian male diagnosed with stage IV (T1b, N2, M1A) non-small cell lung cancer, adenocarcinoma.  The patient presented with a left upper lobe lung nodule in addition to mediastinal lymphadenopathy and pleural-based metastasis as well as a malignant left pleural effusion.  The patient was diagnosed in July 2022.  The patient also had a metastatic brain lesion.   The patient completed SRS to the brain lesion on 09/03/2020 under the care of Dr. MLisbeth Renshaw  The patient is currently undergoing systemic chemotherapy with carboplatin for an AUC of 5, Alimta 500 mg per metered squared, Keytruda 200 mg IV every 3 weeks.  The patient is status  post 14 cycles.  Starting from cycle #5, the patient started maintenance Alimta and Keytruda  On the patient's most recent restaging CT scan from 04/23/2021 the patient had an enlarging left upper lobe nodule.  The patient is undergoing SBRT to this lesion in [place].  The patient reports this is not scheduled at this time but he is expecting a call from their office today or tomorrow to schedule this.  The patient is also being followed by radiation oncology neurooncology regarding a solitary brain metastasis.  He is scheduled for repeat brain MRI in 06/12/2021 and a follow-up with Dr. Mickeal Skinner a few days later.   Labs were reviewed.  Recommend that he proceed with cycle #15 today scheduled.  We will see him back for follow-up visit in 3 weeks for evaluation and repeat blood work before undergoing cycle #16.  I would typically arrange for restaging CT scan at this time.  However I will wait until Dr. Julien Nordmann is back in the office tomorrow to see if he would like me to wait until he completes his SBRT to the lung.  I will call the patient and his wife later this week to let them know.   I have placed referral to pulmonary  medicine at [facility] pulmonary office in [facility].  I offered the patient a chest x-ray today regarding his shortness of breath.  The patient reports that his shortness of breath is stable to slightly gradually worsening over course of several months since he had COVID-19.  He does not feel that he needs a chest x-ray acutely today.  He would like to wait until he is evaluated by pulmonary medicine further recommendations.  The patient was advised to call immediately if he has any concerning symptoms in the interval. The patient voices understanding of current disease status and treatment options and is in agreement with the current care plan. All questions were answered. The patient knows to call the clinic with any problems, questions or concerns. We can certainly see the patient much sooner if necessary      No orders of the defined types were placed in this encounter.     The total time spent in the appointment was 20-29 minutes.   Aaryana Betke L Fonda Rochon, PA-C 06/03/21

## 2021-06-08 ENCOUNTER — Inpatient Hospital Stay: Payer: Medicare HMO

## 2021-06-08 ENCOUNTER — Telehealth: Payer: Self-pay

## 2021-06-08 ENCOUNTER — Other Ambulatory Visit: Payer: Self-pay

## 2021-06-08 ENCOUNTER — Inpatient Hospital Stay: Payer: Medicare HMO | Admitting: Physician Assistant

## 2021-06-08 ENCOUNTER — Encounter: Payer: Self-pay | Admitting: Physician Assistant

## 2021-06-08 ENCOUNTER — Inpatient Hospital Stay: Payer: Medicare HMO | Attending: Internal Medicine

## 2021-06-08 VITALS — BP 133/72 | HR 73 | Temp 97.6°F | Resp 17 | Wt 185.2 lb

## 2021-06-08 DIAGNOSIS — C3492 Malignant neoplasm of unspecified part of left bronchus or lung: Secondary | ICD-10-CM

## 2021-06-08 DIAGNOSIS — C7931 Secondary malignant neoplasm of brain: Secondary | ICD-10-CM | POA: Diagnosis not present

## 2021-06-08 DIAGNOSIS — Z79899 Other long term (current) drug therapy: Secondary | ICD-10-CM | POA: Insufficient documentation

## 2021-06-08 DIAGNOSIS — Z5111 Encounter for antineoplastic chemotherapy: Secondary | ICD-10-CM | POA: Insufficient documentation

## 2021-06-08 DIAGNOSIS — C782 Secondary malignant neoplasm of pleura: Secondary | ICD-10-CM | POA: Insufficient documentation

## 2021-06-08 DIAGNOSIS — Z5112 Encounter for antineoplastic immunotherapy: Secondary | ICD-10-CM

## 2021-06-08 DIAGNOSIS — R0602 Shortness of breath: Secondary | ICD-10-CM

## 2021-06-08 DIAGNOSIS — Z95828 Presence of other vascular implants and grafts: Secondary | ICD-10-CM

## 2021-06-08 DIAGNOSIS — C3412 Malignant neoplasm of upper lobe, left bronchus or lung: Secondary | ICD-10-CM | POA: Insufficient documentation

## 2021-06-08 LAB — CMP (CANCER CENTER ONLY)
ALT: 19 U/L (ref 0–44)
AST: 18 U/L (ref 15–41)
Albumin: 3.7 g/dL (ref 3.5–5.0)
Alkaline Phosphatase: 70 U/L (ref 38–126)
Anion gap: 5 (ref 5–15)
BUN: 19 mg/dL (ref 8–23)
CO2: 29 mmol/L (ref 22–32)
Calcium: 9.1 mg/dL (ref 8.9–10.3)
Chloride: 93 mmol/L — ABNORMAL LOW (ref 98–111)
Creatinine: 0.94 mg/dL (ref 0.61–1.24)
GFR, Estimated: >60 mL/min (ref >60)
Glucose, Bld: 115 mg/dL — ABNORMAL HIGH (ref 70–99)
Potassium: 4.5 mmol/L (ref 3.5–5.1)
Sodium: 127 mmol/L — ABNORMAL LOW (ref 135–145)
Total Bilirubin: 0.5 mg/dL (ref 0.3–1.2)
Total Protein: 5.9 g/dL — ABNORMAL LOW (ref 6.5–8.1)

## 2021-06-08 LAB — CBC WITH DIFFERENTIAL (CANCER CENTER ONLY)
Abs Immature Granulocytes: 0.1 10*3/uL — ABNORMAL HIGH (ref 0.00–0.07)
Basophils Absolute: 0 10*3/uL (ref 0.0–0.1)
Basophils Relative: 0 %
Eosinophils Absolute: 0.1 10*3/uL (ref 0.0–0.5)
Eosinophils Relative: 2 %
HCT: 33.1 % — ABNORMAL LOW (ref 39.0–52.0)
Hemoglobin: 11.8 g/dL — ABNORMAL LOW (ref 13.0–17.0)
Immature Granulocytes: 1 %
Lymphocytes Relative: 25 %
Lymphs Abs: 1.8 10*3/uL (ref 0.7–4.0)
MCH: 34.4 pg — ABNORMAL HIGH (ref 26.0–34.0)
MCHC: 35.6 g/dL (ref 30.0–36.0)
MCV: 96.5 fL (ref 80.0–100.0)
Monocytes Absolute: 0.7 10*3/uL (ref 0.1–1.0)
Monocytes Relative: 10 %
Neutro Abs: 4.4 10*3/uL (ref 1.7–7.7)
Neutrophils Relative %: 62 %
Platelet Count: 127 10*3/uL — ABNORMAL LOW (ref 150–400)
RBC: 3.43 MIL/uL — ABNORMAL LOW (ref 4.22–5.81)
RDW: 14.3 % (ref 11.5–15.5)
WBC Count: 7.2 10*3/uL (ref 4.0–10.5)
nRBC: 0 % (ref 0.0–0.2)

## 2021-06-08 LAB — TSH: TSH: 1.702 u[IU]/mL (ref 0.350–4.500)

## 2021-06-08 MED ORDER — CYANOCOBALAMIN 1000 MCG/ML IJ SOLN
1000.0000 ug | Freq: Once | INTRAMUSCULAR | Status: AC
  Administered 2021-06-08: 1000 ug via INTRAMUSCULAR

## 2021-06-08 MED ORDER — SODIUM CHLORIDE 0.9% FLUSH
10.0000 mL | Freq: Once | INTRAVENOUS | Status: AC
  Administered 2021-06-08: 10 mL

## 2021-06-08 MED ORDER — SODIUM CHLORIDE 0.9% FLUSH
10.0000 mL | INTRAVENOUS | Status: DC | PRN
  Administered 2021-06-08: 10 mL

## 2021-06-08 MED ORDER — HEPARIN SOD (PORK) LOCK FLUSH 100 UNIT/ML IV SOLN
500.0000 [IU] | Freq: Once | INTRAVENOUS | Status: AC | PRN
  Administered 2021-06-08: 500 [IU]

## 2021-06-08 MED ORDER — SODIUM CHLORIDE 0.9 % IV SOLN
500.0000 mg/m2 | Freq: Once | INTRAVENOUS | Status: AC
  Administered 2021-06-08: 1000 mg via INTRAVENOUS
  Filled 2021-06-08: qty 40

## 2021-06-08 MED ORDER — SODIUM CHLORIDE 0.9 % IV SOLN: Freq: Once | INTRAVENOUS | Status: AC

## 2021-06-08 MED ORDER — SODIUM CHLORIDE 0.9 % IV SOLN
200.0000 mg | Freq: Once | INTRAVENOUS | Status: AC
  Administered 2021-06-08: 200 mg via INTRAVENOUS
  Filled 2021-06-08: qty 200

## 2021-06-08 MED ORDER — PROCHLORPERAZINE MALEATE 10 MG PO TABS
10.0000 mg | ORAL_TABLET | Freq: Once | ORAL | Status: AC
  Administered 2021-06-08: 10 mg via ORAL

## 2021-06-08 NOTE — Patient Instructions (Signed)
[facility]  Discharge Instructions: Thank you for choosing [facility] to provide your oncology and hematology care.   If you have a lab appointment with the [facility], please go directly to the [facility] and check in at the registration area.   Wear comfortable clothing and clothing appropriate for easy access to any Portacath or PICC line.   We strive to give you quality time with your provider. You may need to reschedule your appointment if you arrive late (15 or more minutes).  Arriving late affects you and other patients whose appointments are after yours.  Also, if you miss three or more appointments without notifying the office, you may be dismissed from the clinic at the provider's discretion.      For prescription refill requests, have your pharmacy contact our office and allow 72 hours for refills to be completed.    Today you received the following chemotherapy and/or immunotherapy agents:  Keytruda, Alimta      To help prevent nausea and vomiting after your treatment, we encourage you to take your nausea medication as directed.  BELOW ARE SYMPTOMS THAT SHOULD BE REPORTED IMMEDIATELY: *FEVER GREATER THAN 100.4 F (38 C) OR HIGHER *CHILLS OR SWEATING *NAUSEA AND VOMITING THAT IS NOT CONTROLLED WITH YOUR NAUSEA MEDICATION *UNUSUAL SHORTNESS OF BREATH *UNUSUAL BRUISING OR BLEEDING *URINARY PROBLEMS (pain or burning when urinating, or frequent urination) *BOWEL PROBLEMS (unusual diarrhea, constipation, pain near the anus) TENDERNESS IN MOUTH AND THROAT WITH OR WITHOUT PRESENCE OF ULCERS (sore throat, sores in mouth, or a toothache) UNUSUAL RASH, SWELLING OR PAIN  UNUSUAL VAGINAL DISCHARGE OR ITCHING   Items with * indicate a potential emergency and should be followed up as soon as possible or go to the Emergency Department if any problems should occur.  Please show the CHEMOTHERAPY ALERT CARD or IMMUNOTHERAPY ALERT CARD at  check-in to the Emergency Department and triage nurse.  Should you have questions after your visit or need to cancel or reschedule your appointment, please contact [facility]  Dept: [phone]  and follow the prompts.  Office hours are 8:00 a.m. to 4:30 p.m. Monday - Friday. Please note that voicemails left after 4:00 p.m. may not be returned until the following business day.  We are closed weekends and major holidays. You have access to a nurse at all times for urgent questions. Please call the main number to the clinic Dept: [phone] and follow the prompts.   For any non-urgent questions, you may also contact your provider using MyChart. We now offer e-Visits for anyone 77 and older to request care online for non-urgent symptoms. For details visit [email or web address].   Also download the MyChart app! Go to the app store, search "MyChart", open the app, select [facility], and log in with your MyChart username and password.  Due to Covid, a mask is required upon entering the hospital/clinic. If you do not have a mask, one will be given to you upon arrival. For doctor visits, patients may have 1 support person aged 36 or older with them. For treatment visits, patients cannot have anyone with them due to current Covid guidelines and our immunocompromised population.

## 2021-06-08 NOTE — Telephone Encounter (Signed)
Referral has been sent to [facility].   PH: [phone] FX: [phone]   Referral Insurance/Demographics Progress note Labs CT scans   Confirmation was received.

## 2021-06-09 ENCOUNTER — Telehealth: Payer: Self-pay

## 2021-06-09 NOTE — Telephone Encounter (Signed)
This nurse spoke with providers wife and made aware of per provider that he is in agreement with doing scans after patient completes radiation therapy.  It will be discussed at next scheduled appointment in 3 weeks.  Spouse acknowledges understanding.  No further questions at this time.

## 2021-06-12 ENCOUNTER — Ambulatory Visit
Admission: RE | Admit: 2021-06-12 | Discharge: 2021-06-12 | Disposition: A | Discharge status: Home / Self Care | Payer: Medicare HMO | Source: Ambulatory Visit | Attending: Internal Medicine | Admitting: Internal Medicine

## 2021-06-12 ENCOUNTER — Encounter: Payer: Self-pay | Admitting: Medical Oncology

## 2021-06-12 DIAGNOSIS — Z95828 Presence of other vascular implants and grafts: Secondary | ICD-10-CM

## 2021-06-12 DIAGNOSIS — C7931 Secondary malignant neoplasm of brain: Secondary | ICD-10-CM

## 2021-06-12 IMAGING — MR MR HEAD WO/W CM
21 series · 48 of 48 positions shown · IV contrast (17ml Multihance)
Comparison: [DATE]

CLINICAL DATA: Brain/CNS neoplasm, assess treatment response

EXAM:
MRI HEAD WITHOUT AND WITH CONTRAST
TECHNIQUE: Multiplanar, multiecho pulse sequences of the brain and surrounding
structures were obtained without and with intravenous contrast.
CONTRAST:  17mL MULTIHANCE GADOBENATE DIMEGLUMINE 529 MG/ML IV SOLN
Contrast was administered via a port which was accessed by a
registered nurse.

[Series 2: FLAIR · sagittal · 3.0mm · 0.75mm/px · 1 of 41 slices shown (1 of 2)]
[im 1/41]
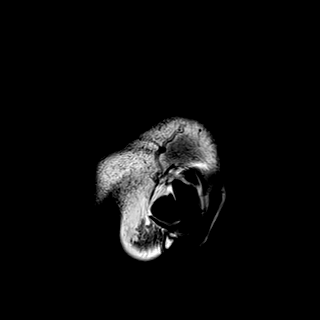

[Series 3: DWI · axial · 3.0mm · 1.50mm/px · 1 of 87 slices shown (1 of 2)]
[im 1/87]
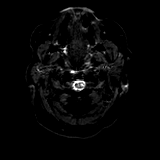

[Series 4: DWI · axial · 3.0mm · 1.50mm/px · 1 of 43 slices shown (2 of 2)]
[im 1/43]
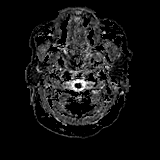

[Series 5: T2 · axial · 5.0mm · 0.57mm/px · 1 of 30 slices shown (1 of 2)]
[im 1/30]
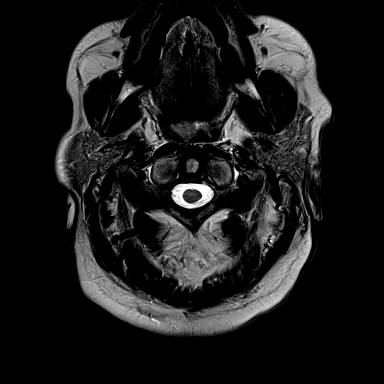

[Series 6: mip_images(sw) · axial · 12.0mm · 0.90mm/px · 1 of 105 slices shown]
[im 1/105]
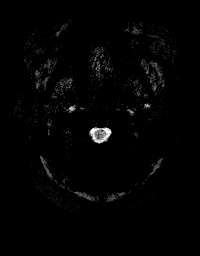

[Series 7: swi_images · axial · 1.5mm · 0.90mm/px · 1 of 112 slices shown]
[im 1/112]
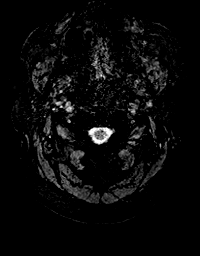

[Series 8: FLAIR · axial · 3.0mm · 0.86mm/px · 1 of 60 slices shown (2 of 2)]
[im 1/60]
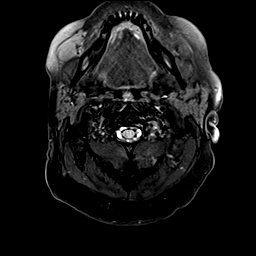

[Series 9: T2 · axial · non-contrast · 1.0mm · 0.86mm/px · 1 of 176 slices shown (2 of 2)]
[im 1/176]
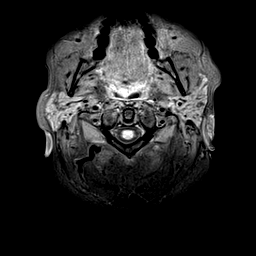

[Series 10: perfusion axial · axial · 5.0mm · 1.72mm/px · z∈[-115,+59]mm · 27 of 1800 slices shown]
[im 1/1800]
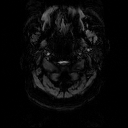
[im 70/1800]
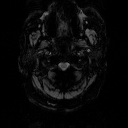
[im 139/1800]
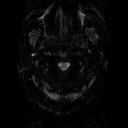
[im 208/1800]
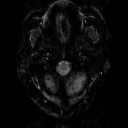
[im 277/1800]
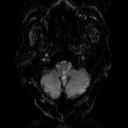
[im 346/1800]
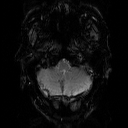
[im 416/1800]
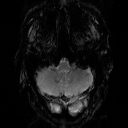
[im 485/1800]
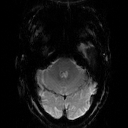
[im 554/1800]
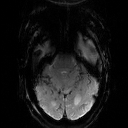
[im 623/1800]
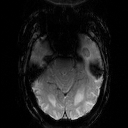
[im 692/1800]
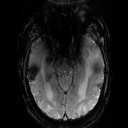
[im 762/1800]
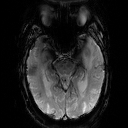
[im 831/1800]
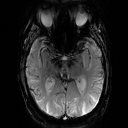
[im 900/1800]
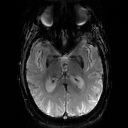
[im 969/1800]
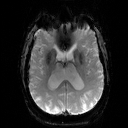
[im 1038/1800]
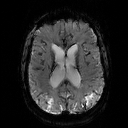
[im 1108/1800]
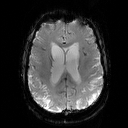
[im 1177/1800]
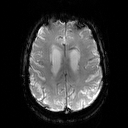
[im 1246/1800]
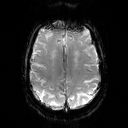
[im 1315/1800]
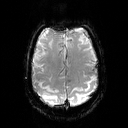
[im 1384/1800]
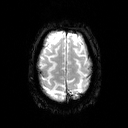
[im 1454/1800]
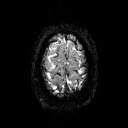
[im 1523/1800]
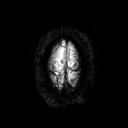
[im 1592/1800]
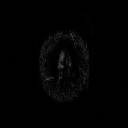
[im 1661/1800]
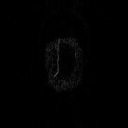
[im 1730/1800]
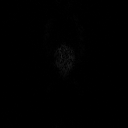
[im 1800/1800]
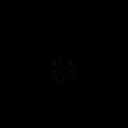

[Series 19: T2 post-contrast · coronal · 3.0mm · 0.57mm/px · 1 of 47 slices shown (1 of 2)]
[im 1/47]
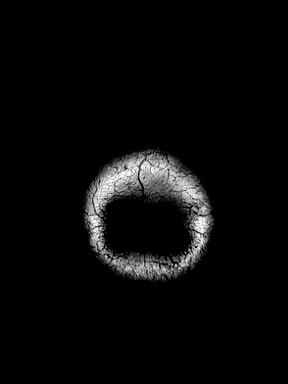

[Series 20: T2 post-contrast · axial · 1.0mm · 0.86mm/px · z∈[-111,+61]mm · 2 of 176 slices shown (2 of 2)]
[im 1/176]
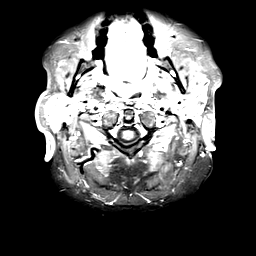
[im 176/176]
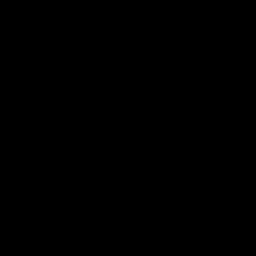

[Series 21: T1 post-contrast · axial · 1.0mm · 0.75mm/px · 1 of 160 slices shown (1 of 2)]
[im 1/160]
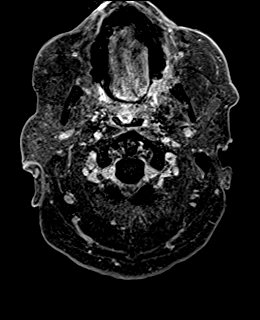

[Series 22: T1 post-contrast · coronal · 3.0mm · 0.57mm/px · 1 of 47 slices shown (2 of 2)]
[im 1/47]
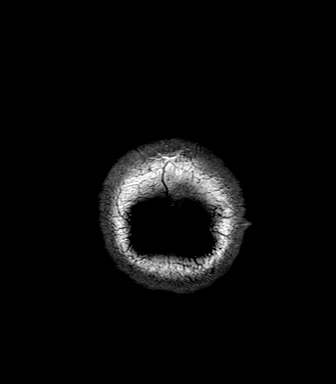

[Series 23: FLAIR post-contrast · sagittal · 3.0mm · 0.75mm/px · 1 of 41 slices shown]
[im 1/41]
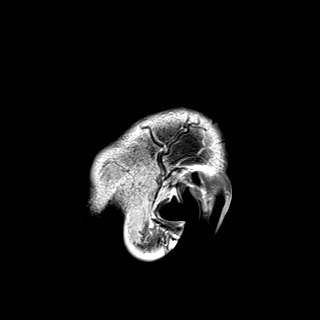

[Series 101: cbv mpr_rgb · 1 of 28 slices shown]
[im 1/28]
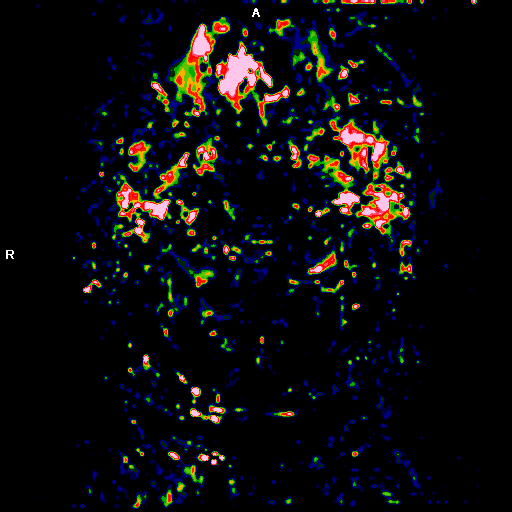

[Series 104: ccbv_rgb · 1 of 29 slices shown]
[im 1/29]
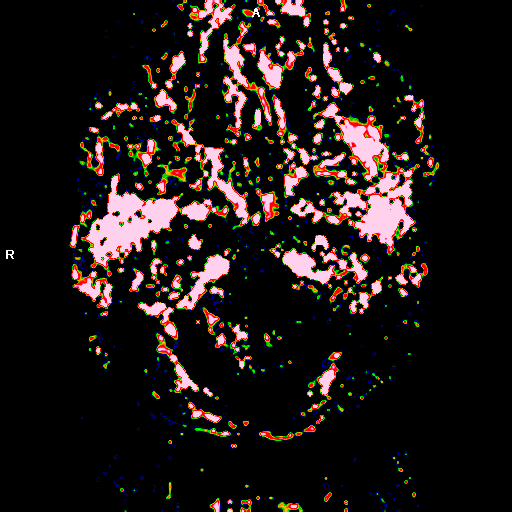

[Series 106: cbf_rgb · 1 of 30 slices shown]
[im 1/30]
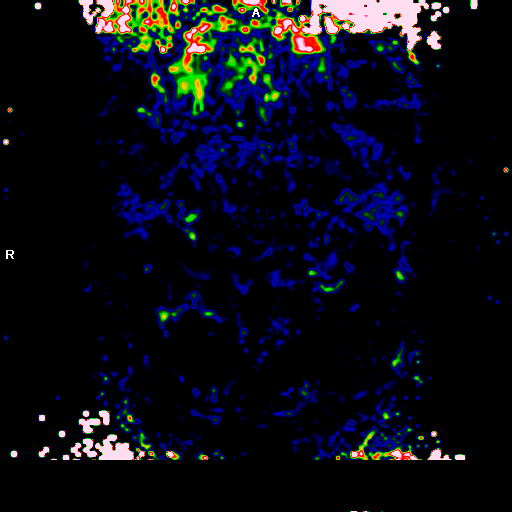

[Series 111: mtt_rgb · 1 of 30 slices shown]
[im 1/30]
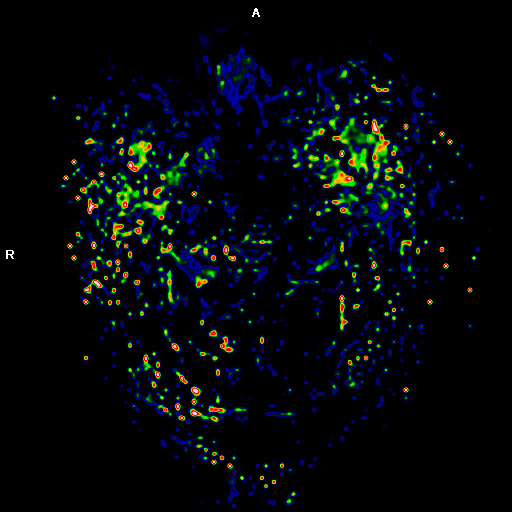

[Series 113: relccbv_rgb · 1 of 30 slices shown (1 of 2)]
[im 1/30]
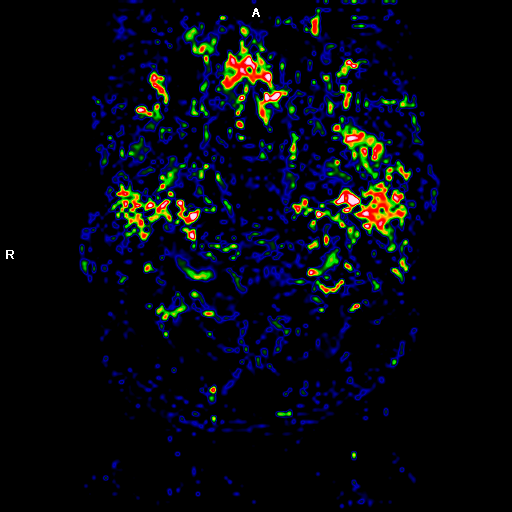

[Series 114: relccbv_rgb · 1 of 30 slices shown (2 of 2)]
[im 1/30]
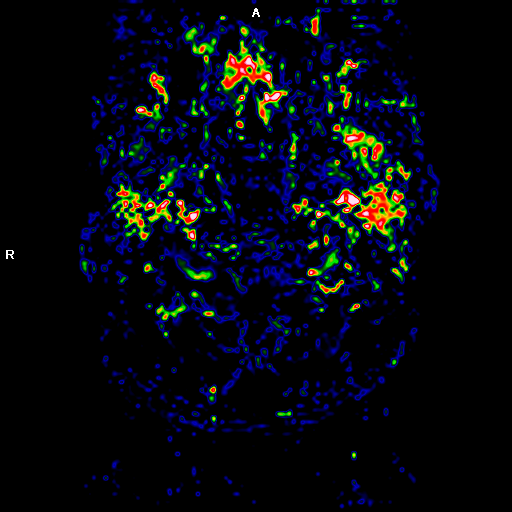

[Series 117: cbv_rgb · 1 of 28 slices shown]
[im 1/28]
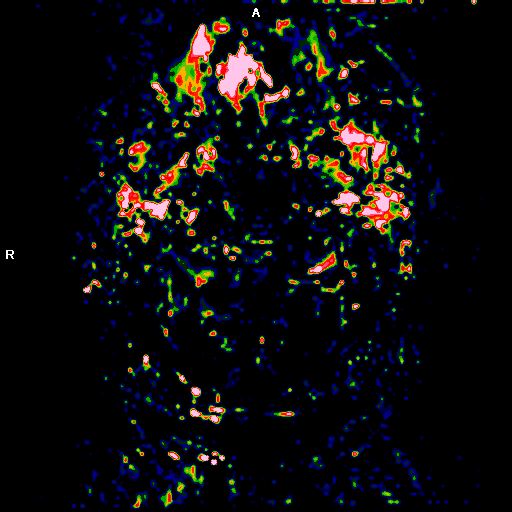

[48 of 48 positions shown; findings below may reference images not displayed]

FINDINGS: Brain: Irregular enhancing lesion of the left temporal lobe measures
similar in size when accounting for differences in measurement
technique. Presently measures 2.1 AP x 1.8 TV cm. Surrounding edema
has substantially decreased. There is no significant mass effect.
Greater reduced diffusion is noted. No subjective elevation of rCBV.

There is no new mass or abnormal enhancement. Ventricles are stable
in size. No acute infarction.

Vascular: Major vessel flow voids at the skull base are preserved.

Skull and upper cervical spine: Normal marrow signal is preserved.

Sinuses/Orbits: Minor mucosal thickening.  Orbits are unremarkable.

Other: Sella is unremarkable.  Mastoid air cells are clear.
IMPRESSION: Stable left temporal lobe lesion. Substantial decrease in
surrounding edema, which may related to steroid therapy.

## 2021-06-12 MED ORDER — HEPARIN SOD (PORK) LOCK FLUSH 100 UNIT/ML IV SOLN
500.0000 [IU] | Freq: Once | INTRAVENOUS | Status: AC
  Administered 2021-06-12: 500 [IU] via INTRAVENOUS

## 2021-06-12 MED ORDER — GADOBENATE DIMEGLUMINE 529 MG/ML IV SOLN
17.0000 mL | Freq: Once | INTRAVENOUS | Status: AC | PRN
  Administered 2021-06-12: 17 mL via INTRAVENOUS

## 2021-06-12 MED ORDER — SODIUM CHLORIDE 0.9% FLUSH
10.0000 mL | INTRAVENOUS | Status: DC | PRN
  Administered 2021-06-12: 10 mL via INTRAVENOUS

## 2021-06-15 ENCOUNTER — Inpatient Hospital Stay: Payer: Medicare HMO

## 2021-06-15 ENCOUNTER — Inpatient Hospital Stay: Payer: Medicare HMO | Admitting: Internal Medicine

## 2021-06-15 ENCOUNTER — Other Ambulatory Visit: Payer: Self-pay

## 2021-06-15 VITALS — BP 136/75 | HR 82 | Temp 97.4°F | Resp 18 | Ht 73.0 in | Wt 181.9 lb

## 2021-06-15 DIAGNOSIS — C7931 Secondary malignant neoplasm of brain: Secondary | ICD-10-CM

## 2021-06-15 DIAGNOSIS — Z5112 Encounter for antineoplastic immunotherapy: Secondary | ICD-10-CM | POA: Diagnosis not present

## 2021-06-15 NOTE — Progress Notes (Signed)
[facility] at [facility] [place], [place] [address] [phone]   Interval Evaluation  Date of Service: 06/15/21 Patient Name: Peter Brown Patient MRN: [id number] Patient DOB: [birth year] Provider: Ventura Sellers, MD  Identifying Statement:  Peter Brown is a 81 y.o. 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  He describes continued resolution or stability in word finding difficulty, confusion experienced last month.  He is independent with function, undergoing radiation treatments for lung in [place], [place].  Currently tapering off decadron, on 1mg  daily right now.  H+P (05/12/21) Patient presented to neurologic attention last week with new onset confusion.  His wife describes him forgetting names, choosing the wrong word, answering simple questions incorrectly.  Onset was over several days.  No seizure activity was witnessed.  No issues with his gait, no right sided weakness.  He denies headaches.  Decadron was started on 5/1 by rad onc, 4mg  twice per day.  He describes significant improvement, very close to his prior baseline, over the past 8 days.  He mowed his lawn this weekend, is otherwise independent with activities of living.  On Pem/Pem with Dr. Julien Brown, doing  well.  Medications: Current Outpatient Medications on File Prior to Visit  Medication Sig Dispense Refill   aspirin 81 MG EC tablet Take by mouth.     Bacillus Coagulans-Inulin (PROBIOTIC) 1-250 BILLION-MG CAPS Take 1 tablet by mouth daily.     bisacodyl (DULCOLAX) 5 MG EC tablet Take 5 mg by mouth 3 (three) times daily as needed for moderate constipation.     Cholecalciferol (D3 PO) Take 1 tablet by mouth daily.     coal tar-salicylic acid 2 % shampoo Apply topically daily as needed for itching.     dexamethasone (DECADRON) 1 MG tablet Take 3 tablets (3 mg total) by mouth daily with breakfast for 7 days, THEN 2 tablets (2 mg total) daily with breakfast for 7 days, THEN 1 tablet (1 mg total) daily with breakfast for 7 days. 42 tablet 0   DULoxetine (CYMBALTA) 30 MG capsule Take 30 mg by mouth daily.     fexofenadine (ALLEGRA) 180 MG tablet Take 180 mg by mouth daily.     fluocinonide cream (LIDEX) 9.89 % Apply 1 application topically 2 (two) times daily.     folic acid (FOLVITE) 1 MG tablet TAKE 1 TABLET(1 MG) BY MOUTH DAILY 30 tablet 4   hydrocortisone cream 1 % Apply 1 application topically 2 (two) times daily. 453.6 g 0   ketoconazole (NIZORAL) 2 % cream Apply 1 application topically daily.     Krill Oil (OMEGA-3) 500 MG CAPS Take by mouth.     levothyroxine (SYNTHROID) 25 MCG tablet Take 1 tablet (25 mcg total) by mouth daily before breakfast. 30 tablet 2   lidocaine-prilocaine (EMLA) cream  Apply 1 application topically as needed. 30 g 1   Magnesium 250 MG TABS Take by mouth.     Niacin (VITAMIN B-3 PO) Take 1 tablet by mouth daily.     omeprazole (PRILOSEC) 40 MG capsule Take 40 mg by mouth daily.     ondansetron (ZOFRAN-ODT) 4 MG disintegrating tablet Take 1 tablet (4 mg total) by mouth every 8 (eight) hours as needed. (Patient not taking: Reported on 06/08/2021) 20 tablet 0   prochlorperazine (COMPAZINE) 10 MG tablet TAKE 1 TABLET(10 MG) BY MOUTH EVERY 6 HOURS AS NEEDED FOR NAUSEA OR  VOMITING (Patient not taking: Reported on 06/08/2021) 30 tablet 0   triamcinolone cream (KENALOG) 0.1 % Apply 1 application topically daily as needed. 453 g 6   No current facility-administered medications on file prior to visit.    Allergies: No Known Allergies Past Medical History:  Past Medical History:  Diagnosis Date   Lung cancer ([facility])    Lung cancer metastatic to brain [facility])    Past Surgical History:  Past Surgical History:  Procedure Laterality Date   IR IMAGING GUIDED PORT INSERTION  11/24/2020   KNEE SURGERY     orthoscopic     TONSILLECTOMY AND ADENOIDECTOMY     Social History:  Social History   Socioeconomic History   Marital status: Married    Spouse name: Not on file   Number of children: Not on file   Years of education: Not on file   Highest education level: Not on file  Occupational History   Not on file  Tobacco Use   Smoking status: Never   Smokeless tobacco: Never  Substance and Sexual Activity   Alcohol use: Never   Drug use: Never   Sexual activity: Not on file  Other Topics Concern   Not on file  Social History Narrative   Not on file   Social Determinants of Health   Financial Resource Strain: Low Risk  (08/22/2020)   Overall Financial Resource Strain (CARDIA)    Difficulty of Paying Living Expenses: Not very hard  Food Insecurity: No Food Insecurity (08/22/2020)   Hunger Vital Sign    Worried About Running Out of Food in the Last Year: Never true    Ran Out of Food in the Last Year: Never true  Transportation Needs: No Transportation Needs (08/22/2020)   PRAPARE - Hydrologist (Medical): No    Lack of Transportation (Non-Medical): No  Physical Activity: Not on file  Stress: Stress Concern Present (08/22/2020)   [place]    Feeling of Stress : To some extent  Social Connections: Socially Integrated (08/22/2020)   Social Connection and Isolation  Panel [NHANES]    Frequency of Communication with Friends and Family: More than three times a week    Frequency of Social Gatherings with Friends and Family: Once a week    Attends Religious Services: More than 4 times per year    Active Member of [employer] or Organizations: Yes    Attends Music therapist: More than 4 times per year    Marital Status: Married  Human resources officer Violence: Not on file   Family History: No family history on file.  Review of Systems: Constitutional: Doesn't report fevers, chills or abnormal weight loss Eyes: Doesn't report blurriness of vision Ears, nose, mouth, throat, and face: Doesn't report sore throat Respiratory: Doesn't report cough, dyspnea or wheezes Cardiovascular: Doesn't report palpitation, chest discomfort  Gastrointestinal:  Doesn't report nausea, constipation, diarrhea GU: Doesn't report incontinence Skin: Doesn't report skin rashes Neurological: Per HPI Musculoskeletal: Doesn't report joint pain Behavioral/Psych: Doesn't report anxiety  Physical Exam: Vitals:   06/15/21 1100  BP: 136/75  Pulse: 82  Resp: 18  Temp: (!) 97.4 F (36.3 C)  SpO2: 99%    KPS: 90. General: Alert, cooperative, pleasant, in no acute distress Head: Normal EENT: No conjunctival injection or scleral icterus.  Lungs: Resp effort normal Cardiac: Regular rate Abdomen: Non-distended abdomen Skin: No rashes cyanosis or petechiae. Extremities: No clubbing or edema  Neurologic Exam: Mental Status: Awake, alert, attentive to examiner. Oriented to self and environment. Language is fluent with intact comprehension.  Cranial Nerves: Visual acuity is grossly normal. Visual fields are full. Extra-ocular movements intact. No ptosis. Face is symmetric Motor: Tone and bulk are normal. Power is full in both arms and legs. Reflexes are symmetric, no pathologic reflexes present.  Sensory: Intact to light touch Gait: Normal.   Labs: I have reviewed the data  as listed    Component Value Date/Time   NA 127 (L) 06/08/2021 1035   K 4.5 06/08/2021 1035   CL 93 (L) 06/08/2021 1035   CO2 29 06/08/2021 1035   GLUCOSE 115 (H) 06/08/2021 1035   BUN 19 06/08/2021 1035   CREATININE 0.94 06/08/2021 1035   CALCIUM 9.1 06/08/2021 1035   PROT 5.9 (L) 06/08/2021 1035   ALBUMIN 3.7 06/08/2021 1035   AST 18 06/08/2021 1035   ALT 19 06/08/2021 1035   ALKPHOS 70 06/08/2021 1035   BILITOT 0.5 06/08/2021 1035   GFRNONAA >60 06/08/2021 1035   Lab Results  Component Value Date   WBC 7.2 06/08/2021   NEUTROABS 4.4 06/08/2021   HGB 11.8 (L) 06/08/2021   HCT 33.1 (L) 06/08/2021   MCV 96.5 06/08/2021   PLT 127 (L) 06/08/2021    Imaging:  MR BRAIN W WO CONTRAST  Result Date: 06/12/2021 CLINICAL DATA:  Brain/CNS neoplasm, assess treatment response EXAM: MRI HEAD WITHOUT AND WITH CONTRAST TECHNIQUE: Multiplanar, multiecho pulse sequences of the brain and surrounding structures were obtained without and with intravenous contrast. CONTRAST:  35mL MULTIHANCE GADOBENATE DIMEGLUMINE 529 MG/ML IV SOLN Contrast was administered via a port which was accessed by a Equities trader. COMPARISON:  05/07/2021 FINDINGS: Brain: Irregular enhancing lesion of the left temporal lobe measures similar in size when accounting for differences in measurement technique. Presently measures 2.1 AP x 1.8 TV cm. Surrounding edema has substantially decreased. There is no significant mass effect. Greater reduced diffusion is noted. No subjective elevation of rCBV. There is no new mass or abnormal enhancement. Ventricles are stable in size. No acute infarction. Vascular: Major vessel flow voids at the skull base are preserved. Skull and upper cervical spine: Normal marrow signal is preserved. Sinuses/Orbits: Minor mucosal thickening.  Orbits are unremarkable. Other: Sella is unremarkable.  Mastoid air cells are clear. IMPRESSION: Stable left temporal lobe lesion. Substantial decrease in surrounding  edema, which may related to steroid therapy. Electronically Signed   By: Macy Mis M.D.   On: 06/12/2021 16:54    [facility] Clinician Interpretation: I have personally reviewed the radiological images as listed.  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  MRI brain demonstrates stable findings within treated left temporal lobe region.  Edema is significantly improved with corticosteroids.  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  We recommended discontinuing Decadron as scheduled within the taper provided.  We appreciate the opportunity to participate in the care of Criag Wicklund.   We ask that Rontavious Albright return to clinic in 3 months following next brain MRI, or sooner as needed.  All questions were answered. The patient knows to call the clinic with any problems, questions or concerns. No barriers to learning were detected.  The total time spent in the encounter was 30 minutes and more than 50% was on counseling and review of test results   Ventura Sellers, MD Medical Director of Neuro-Oncology [facility] at [facility] 06/15/21 10:30 AM

## 2021-06-17 ENCOUNTER — Other Ambulatory Visit: Payer: Self-pay | Admitting: Radiation Therapy

## 2021-06-18 ENCOUNTER — Ambulatory Visit: Payer: Medicare HMO | Admitting: Internal Medicine

## 2021-06-22 ENCOUNTER — Encounter: Payer: Self-pay | Admitting: Internal Medicine

## 2021-06-24 DIAGNOSIS — E039 Hypothyroidism, unspecified: Secondary | ICD-10-CM | POA: Diagnosis present

## 2021-06-29 ENCOUNTER — Other Ambulatory Visit: Payer: Medicare HMO

## 2021-06-29 ENCOUNTER — Encounter: Payer: Self-pay | Admitting: *Deleted

## 2021-06-29 ENCOUNTER — Inpatient Hospital Stay: Payer: Medicare HMO

## 2021-06-29 ENCOUNTER — Other Ambulatory Visit: Payer: Self-pay

## 2021-06-29 ENCOUNTER — Inpatient Hospital Stay: Payer: Medicare HMO | Admitting: Internal Medicine

## 2021-06-29 ENCOUNTER — Encounter: Payer: Self-pay | Admitting: Internal Medicine

## 2021-06-29 VITALS — HR 83

## 2021-06-29 VITALS — BP 125/82 | HR 103 | Temp 97.9°F | Resp 16 | Wt 182.1 lb

## 2021-06-29 DIAGNOSIS — C3490 Malignant neoplasm of unspecified part of unspecified bronchus or lung: Secondary | ICD-10-CM | POA: Diagnosis not present

## 2021-06-29 DIAGNOSIS — Z95828 Presence of other vascular implants and grafts: Secondary | ICD-10-CM

## 2021-06-29 DIAGNOSIS — C3492 Malignant neoplasm of unspecified part of left bronchus or lung: Secondary | ICD-10-CM

## 2021-06-29 DIAGNOSIS — Z5112 Encounter for antineoplastic immunotherapy: Secondary | ICD-10-CM | POA: Diagnosis not present

## 2021-06-29 DIAGNOSIS — Z5111 Encounter for antineoplastic chemotherapy: Secondary | ICD-10-CM | POA: Diagnosis not present

## 2021-06-29 LAB — CBC WITH DIFFERENTIAL (CANCER CENTER ONLY)
Abs Immature Granulocytes: 0.03 10*3/uL (ref 0.00–0.07)
Basophils Absolute: 0 10*3/uL (ref 0.0–0.1)
Basophils Relative: 1 %
Eosinophils Absolute: 0.3 10*3/uL (ref 0.0–0.5)
Eosinophils Relative: 7 %
HCT: 30.5 % — ABNORMAL LOW (ref 39.0–52.0)
Hemoglobin: 10.8 g/dL — ABNORMAL LOW (ref 13.0–17.0)
Immature Granulocytes: 1 %
Lymphocytes Relative: 32 %
Lymphs Abs: 1.6 10*3/uL (ref 0.7–4.0)
MCH: 34 pg (ref 26.0–34.0)
MCHC: 35.4 g/dL (ref 30.0–36.0)
MCV: 95.9 fL (ref 80.0–100.0)
Monocytes Absolute: 0.8 10*3/uL (ref 0.1–1.0)
Monocytes Relative: 17 %
Neutro Abs: 2.1 10*3/uL (ref 1.7–7.7)
Neutrophils Relative %: 42 %
Platelet Count: 176 10*3/uL (ref 150–400)
RBC: 3.18 MIL/uL — ABNORMAL LOW (ref 4.22–5.81)
RDW: 15.2 % (ref 11.5–15.5)
WBC Count: 4.8 10*3/uL (ref 4.0–10.5)
nRBC: 0 % (ref 0.0–0.2)

## 2021-06-29 LAB — TSH: TSH: 3.271 u[IU]/mL (ref 0.350–4.500)

## 2021-06-29 LAB — CMP (CANCER CENTER ONLY)
ALT: 12 U/L (ref 0–44)
AST: 22 U/L (ref 15–41)
Albumin: 3.7 g/dL (ref 3.5–5.0)
Alkaline Phosphatase: 85 U/L (ref 38–126)
Anion gap: 6 (ref 5–15)
BUN: 11 mg/dL (ref 8–23)
CO2: 28 mmol/L (ref 22–32)
Calcium: 9.4 mg/dL (ref 8.9–10.3)
Chloride: 105 mmol/L (ref 98–111)
Creatinine: 1.01 mg/dL (ref 0.61–1.24)
GFR, Estimated: >60 mL/min (ref >60)
Glucose, Bld: 118 mg/dL — ABNORMAL HIGH (ref 70–99)
Potassium: 4.4 mmol/L (ref 3.5–5.1)
Sodium: 139 mmol/L (ref 135–145)
Total Bilirubin: 0.5 mg/dL (ref 0.3–1.2)
Total Protein: 6.4 g/dL — ABNORMAL LOW (ref 6.5–8.1)

## 2021-06-29 MED ORDER — SODIUM CHLORIDE 0.9 % IV SOLN
200.0000 mg | Freq: Once | INTRAVENOUS | Status: AC
  Administered 2021-06-29: 200 mg via INTRAVENOUS
  Filled 2021-06-29: qty 200

## 2021-06-29 MED ORDER — SODIUM CHLORIDE 0.9% FLUSH
10.0000 mL | INTRAVENOUS | Status: DC | PRN
  Administered 2021-06-29: 10 mL

## 2021-06-29 MED ORDER — HEPARIN SOD (PORK) LOCK FLUSH 100 UNIT/ML IV SOLN
500.0000 [IU] | Freq: Once | INTRAVENOUS | Status: AC | PRN
  Administered 2021-06-29: 500 [IU]

## 2021-06-29 MED ORDER — SODIUM CHLORIDE 0.9 % IV SOLN: Freq: Once | INTRAVENOUS | Status: AC

## 2021-06-29 MED ORDER — PROCHLORPERAZINE MALEATE 10 MG PO TABS
10.0000 mg | ORAL_TABLET | Freq: Once | ORAL | Status: AC
  Administered 2021-06-29: 10 mg via ORAL
  Filled 2021-06-29: qty 1

## 2021-06-29 MED ORDER — SODIUM CHLORIDE 0.9% FLUSH
10.0000 mL | Freq: Once | INTRAVENOUS | Status: AC
  Administered 2021-06-29: 10 mL

## 2021-06-29 MED ORDER — SODIUM CHLORIDE 0.9 % IV SOLN
500.0000 mg/m2 | Freq: Once | INTRAVENOUS | Status: AC
  Administered 2021-06-29: 1000 mg via INTRAVENOUS
  Filled 2021-06-29: qty 40

## 2021-07-01 ENCOUNTER — Encounter: Payer: Self-pay | Admitting: Internal Medicine

## 2021-07-07 ENCOUNTER — Encounter: Payer: Self-pay | Admitting: Internal Medicine

## 2021-07-13 ENCOUNTER — Telehealth: Payer: Self-pay

## 2021-07-13 NOTE — Telephone Encounter (Signed)
Faxed a copy of last MD visit note to Dr. [facility].

## 2021-07-15 ENCOUNTER — Encounter: Payer: Self-pay | Admitting: Internal Medicine

## 2021-07-15 NOTE — Progress Notes (Signed)
[facility] OFFICE PROGRESS NOTE  [name], MD [address] [place] [address]  DIAGNOSIS: Stage IV (T1b, N2, M1a) non-small cell lung cancer, adenocarcinoma diagnosed in July 2022 and presented with left upper lobe nodule in addition to AP window lymphadenopathy and left-sided malignant pleural effusion as well as pleural metastatic disease.  The patient also has solitary left temporal brain metastasis.   Biomarker Findings Microsatellite status - MS-Stable Tumor Mutational Burden - 4 Muts/Mb Genomic Findings For a complete list of the genes assayed, please refer to the Appendix. LDJ5TS V779T PTEN splice site [id number]>P - subclonal? DOT1L S911L - subclonal? RAD21 S271* RB1 loss exons 3-23 TP53 N340f*34 8 Disease relevant genes with no reportable alterations: ALK, BRAF, EGFR, ERBB2, KRAS, MET, RET, ROS1  PRIOR THERAPY: 1)  SRS to the brain completed on 09/03/2020 2) palliative [facility] the enlarging [facility] upper [facility] lung [facility]. Completed in June 2023  CURRENT THERAPY: 1) Systemic chemotherapy with carboplatin for AUC of 5, Alimta 500 Mg/M2 and Keytruda 200 Mg IV every 3 weeks.  First dose August 13, 2020.  Status post 16 cycles.  Starting from cycle #5 he is on maintenance treatment with Alimta and Keytruda every 3 weeks.  INTERVAL HISTORY: Peter Ozburn81y.o. male returns to the clinic today for a follow-up visit accompanied by his wife.  The patient was last seen in the clinic 3 weeks ago.  Since last being seen, the patient has been endorsing fatigue, shortness of breath, more coughing/chest congestion, decreased exercise tolerance, and decreased appetite.  Of note, [place].  You are going to see the pulmonologist tomorrow and are going to see if he qualifies for oxygen during the day as well as they  felt like the oxygen at night has helped him.  Of note his oxygen is 97% on room air today.  He was also given nebulizers and inhalers.  He has not seen much improvement with that at this time.  He also completed palliative radiation to the enlarging left upper lobe lung mass a few weeks ago in June 2023.  [place]  He also reports he completed antibiotics for lower extremity cellulitis about 2 weeks ago.  His wife also mentions that his memory is getting worse.  Overall, the patient used to not have activity restrictions but notices he needs to rest more due to shortness of breath and his energy.  He denies any visible abnormal bleeding or bruising including epistaxis, gingival bleeding, hemoptysis, hematemesis, melena, or hematochezia.  He reports sometimes after chemotherapy treatments his temperature may be 99.  His wife will give him Tylenol.  Denies any night sweats.  He lost approximately 2 pounds since last being seen.  He denies any evidence of thrush.  He denies any chest pain or hemoptysis.  He had some nausea this morning without any vomiting. He uses Dulcolax for constipation.  He denies any current headaches.  He feels like he is not able to see the TV as clear as he used to.  He is followed closely by neuro oncology for his history of metastatic disease to the brain.  He had a brain MRI and a follow-up last month and his next brain MRI is scheduled for 09/18/2021.  He denies any rashes or skin changes. The patient recently had a restaging CT scan performed.  He is here today for evaluation to review his scan results before starting cycle #17     MEDICAL HISTORY: Past Medical History:  Diagnosis Date   Lung cancer ([place])    Lung cancer metastatic to brain [facility])     ALLERGIES:  has no active allergies.  MEDICATIONS:  Current Outpatient Medications  Medication Sig Dispense Refill   methylPREDNISolone (MEDROL DOSEPAK) 4 MG TBPK tablet Use as instructed  21 tablet 0   albuterol (VENTOLIN HFA) 108 (90 Base) MCG/ACT inhaler Inhale 2 puffs into the lungs every 4 (four) hours as needed. Every 4-6 hours     aspirin 81 MG EC tablet Take by mouth.     Bacillus Coagulans-Inulin (PROBIOTIC) 1-250 BILLION-MG CAPS Take 1 tablet by mouth daily.     bisacodyl (DULCOLAX) 5 MG EC tablet Take 5 mg by mouth 3 (three) times daily as needed for moderate constipation.     Cholecalciferol (D3 PO) Take 1 tablet by mouth daily.     coal tar-salicylic acid 2 % shampoo Apply topically daily as needed for itching.     DULoxetine (CYMBALTA) 30 MG capsule Take 30 mg by mouth daily.     fexofenadine (ALLEGRA) 180 MG tablet Take 180 mg by mouth daily.     fluocinonide cream (LIDEX) 9.45 % Apply 1 application topically 2 (two) times daily.     folic acid (FOLVITE) 1 MG tablet TAKE 1 TABLET(1 MG) BY MOUTH DAILY 30 tablet 4   hydrocortisone cream 1 % Apply 1 application topically 2 (two) times daily. 453.6 g 0   ketoconazole (NIZORAL) 2 % cream Apply 1 application topically daily.     Krill Oil (OMEGA-3) 500 MG CAPS Take by mouth.     levothyroxine (SYNTHROID) 25 MCG tablet Take 1 tablet (25 mcg total) by mouth daily before breakfast. 30 tablet 2   lidocaine-prilocaine (EMLA) cream Apply 1 application topically as needed. 30 g 1   Magnesium 250 MG TABS Take by mouth.     Niacin (VITAMIN B-3 PO) Take 1 tablet by mouth daily.     omeprazole (PRILOSEC) 40 MG capsule Take 40 mg by mouth daily.     ondansetron (ZOFRAN-ODT) 4 MG disintegrating tablet Take 1 tablet (4 mg total) by mouth every 8 (eight) hours as needed. 20 tablet 0   prochlorperazine (COMPAZINE) 10 MG tablet TAKE 1 TABLET(10 MG) BY MOUTH EVERY 6 HOURS AS NEEDED FOR NAUSEA OR VOMITING 30 tablet 0   triamcinolone cream (KENALOG) 0.1 % Apply 1 application topically daily as needed. 453 g 6   No current facility-administered medications for this visit.    SURGICAL HISTORY:  Past Surgical History:  Procedure  Laterality Date   IR IMAGING GUIDED PORT INSERTION  11/24/2020   KNEE SURGERY     orthoscopic     TONSILLECTOMY AND ADENOIDECTOMY      REVIEW OF SYSTEMS:   Review of Systems  Constitutional: Positive for fatigue, decreased appetite, and 2 pound weight loss.  Negative for chills and fever.  HENT:   Negative for mouth sores, nosebleeds, sore throat and trouble swallowing.   Eyes: Negative for eye problems and icterus.  Respiratory: Positive for increased dyspnea and cough.  Negative for hemoptysis and wheezing.   Cardiovascular: Negative for chest pain and leg swelling.  Gastrointestinal: Positive for mild nausea this a.m.  Negative for abdominal pain, constipation, diarrhea, nausea and vomiting.  Genitourinary: Negative for bladder incontinence, difficulty urinating, dysuria, frequency and hematuria.   Musculoskeletal: Negative for back  pain, gait problem, neck pain and neck stiffness.  Skin: Negative for itching and rash.  Neurological: Negative for dizziness, extremity weakness, gait problem, headaches, light-headedness and seizures.  Hematological: Negative for adenopathy. Does not bruise/bleed easily.  Psychiatric/Behavioral: Positive for worsening memory per patient's wife.  Negative for confusion, depression and sleep disturbance. The patient is not nervous/anxious.     PHYSICAL EXAMINATION:  Blood pressure 138/80, pulse 70, temperature (!) 97.4 F (36.3 C), temperature source Oral, resp. rate 18, height '6\' 1"'  (1.854 m), weight 180 lb 14.4 oz (82.1 kg), SpO2 97 %.  ECOG PERFORMANCE STATUS: 1  Physical Exam  Constitutional: Oriented to person, place, and time and well-developed, well-nourished, and in no distress.  HENT:  Head: Normocephalic and atraumatic.  Mouth/Throat: Oropharynx is clear and moist. No oropharyngeal exudate.  Eyes: Conjunctivae are normal. Right eye exhibits no discharge. Left eye exhibits no discharge. No scleral icterus.  Neck: Normal range of motion. Neck  supple.  Cardiovascular: Normal rate, regular rhythm, normal heart sounds and intact distal pulses.   Pulmonary/Chest: Effort normal.  Decreased breath sounds in the left lung.  No respiratory distress. No wheezes. No rales.  Abdominal: Soft. Bowel sounds are normal. Exhibits no distension and no mass. There is no tenderness.  Musculoskeletal: Normal range of motion. Exhibits no edema.  Lymphadenopathy:    No cervical adenopathy.  Neurological: Alert and oriented to person, place, and time. Exhibits normal muscle tone. Gait normal. Coordination normal.  Skin: Skin is warm and dry. No rash noted. Not diaphoretic. No erythema. No pallor.  Psychiatric: Mood, memory and judgment normal.  Vitals reviewed.  LABORATORY DATA: Lab Results  Component Value Date   WBC 5.2 07/21/2021   HGB 9.5 (L) 07/21/2021   HCT 27.9 (L) 07/21/2021   MCV 102.2 (H) 07/21/2021   PLT 220 07/21/2021      Chemistry      Component Value Date/Time   NA 139 07/21/2021 1112   K 4.3 07/21/2021 1112   CL 106 07/21/2021 1112   CO2 29 07/21/2021 1112   BUN 12 07/21/2021 1112   CREATININE 0.98 07/21/2021 1112      Component Value Date/Time   CALCIUM 9.4 07/21/2021 1112   ALKPHOS 81 07/21/2021 1112   AST 22 07/21/2021 1112   ALT 7 07/21/2021 1112   BILITOT 0.5 07/21/2021 1112       RADIOGRAPHIC STUDIES:  CT Chest W Contrast  Result Date: 07/17/2021 CLINICAL DATA:  Follow-up metastatic left lung adenocarcinoma. Assess treatment response. * Tracking Code: BO * EXAM: CT CHEST, ABDOMEN, AND PELVIS WITH CONTRAST TECHNIQUE: Multidetector CT imaging of the chest, abdomen and pelvis was performed following the standard protocol during bolus administration of intravenous contrast. RADIATION DOSE REDUCTION: This exam was performed according to the departmental dose-optimization program which includes automated exposure control, adjustment of the mA and/or kV according to patient size and/or use of iterative reconstruction  technique. CONTRAST:  134m OMNIPAQUE IOHEXOL 300 MG/ML  SOLN COMPARISON:  04/23/2021 FINDINGS: CT CHEST FINDINGS Cardiovascular: No acute findings. Aortic and coronary atherosclerotic calcification incidentally noted. Mediastinum/Lymph Nodes: No masses or pathologically enlarged lymph nodes identified. Lungs/Pleura: Chronic interstitial fibrosis again demonstrated in the subpleural lung zones. Pulmonary nodule in the anteromedial left upper lobe has decreased in size, currently measuring 6 x 6 mm on image 29/2, compared to 10 x 9 mm previously. Several patchy areas of airspace opacity in the peripheral left upper lobe have increased since previous study, likely due to infectious or inflammatory etiology.  No new or enlarging solid pulmonary nodules identified. No evidence pleural effusion. Musculoskeletal:  No suspicious bone lesions identified. CT ABDOMEN AND PELVIS FINDINGS Hepatobiliary: No masses identified. Gallbladder is unremarkable. No evidence of biliary ductal dilatation. Pancreas:  No mass or inflammatory changes. Spleen:  Within normal limits in size and appearance. Adrenals/Urinary tract:  No masses or hydronephrosis. Stomach/Bowel: No evidence of obstruction, inflammatory process, or abnormal fluid collections. Diffuse colonic diverticulosis again noted, however there is no evidence of diverticulitis. Normal appendix visualized. Vascular/Lymphatic: No pathologically enlarged lymph nodes identified. No acute vascular findings. Aortic atherosclerotic calcification incidentally noted. Reproductive:  No mass or other significant abnormality identified. Other:  None. Musculoskeletal: No suspicious bone lesions identified. 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 IMPRESSION: Decreased size of left upper lobe pulmonary nodule. Increased patchy areas of airspace opacity in peripheral left upper lobe, most likely due to inflammatory or infectious  etiology. 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 No evidence of abdominal or pelvic metastatic disease. Colonic diverticulosis, without radiographic evidence of diverticulitis. Pulmonary interstitial fibrosis. Aortic Atherosclerosis (ICD10-I70.0). Electronically Signed   By: Marlaine Hind M.D.   On: 07/17/2021 13:30   CT Abdomen Pelvis W Contrast  Result Date: 07/17/2021 CLINICAL DATA:  Follow-up metastatic left lung adenocarcinoma. Assess treatment response. * Tracking Code: BO * EXAM: CT CHEST, ABDOMEN, AND PELVIS WITH CONTRAST TECHNIQUE: Multidetector CT imaging of the chest, abdomen and pelvis was performed following the standard protocol during bolus administration of intravenous contrast. RADIATION DOSE REDUCTION: This exam was performed according to the departmental dose-optimization program which includes automated exposure control, adjustment of the mA and/or kV according to patient size and/or use of iterative reconstruction technique. CONTRAST:  148m OMNIPAQUE IOHEXOL 300 MG/ML  SOLN COMPARISON:  04/23/2021 FINDINGS: CT CHEST FINDINGS Cardiovascular: No acute findings. Aortic and coronary atherosclerotic calcification incidentally noted. Mediastinum/Lymph Nodes: No masses or pathologically enlarged lymph nodes identified. Lungs/Pleura: Chronic interstitial fibrosis again demonstrated in the subpleural lung zones. Pulmonary nodule in the anteromedial left upper lobe has decreased in size, currently measuring 6 x 6 mm on image 29/2, compared to 10 x 9 mm previously. Several patchy areas of airspace opacity in the peripheral left upper lobe have increased since previous study, likely due to infectious or inflammatory etiology. No new or enlarging solid pulmonary nodules identified. No evidence pleural effusion. Musculoskeletal:  No suspicious bone lesions identified. CT ABDOMEN AND PELVIS FINDINGS Hepatobiliary: No masses identified. Gallbladder is unremarkable. No  evidence of biliary ductal dilatation. Pancreas:  No mass or inflammatory changes. Spleen:  Within normal limits in size and appearance. Adrenals/Urinary tract:  No masses or hydronephrosis. Stomach/Bowel: No evidence of obstruction, inflammatory process, or abnormal fluid collections. Diffuse colonic diverticulosis again noted, however there is no evidence of diverticulitis. Normal appendix visualized. Vascular/Lymphatic: No pathologically enlarged lymph nodes identified. No acute vascular findings. Aortic atherosclerotic calcification incidentally noted. Reproductive:  No mass or other significant abnormality identified. Other:  None. Musculoskeletal: No suspicious bone lesions identified. 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 IMPRESSION: Decreased size of left upper lobe pulmonary nodule. Increased patchy areas of airspace opacity in peripheral left upper lobe, most likely due to inflammatory or infectious etiology. 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 No evidence of abdominal or pelvic metastatic disease. Colonic diverticulosis, without radiographic evidence of diverticulitis. Pulmonary interstitial fibrosis. Aortic Atherosclerosis (ICD10-I70.0). Electronically Signed   By: JMyles RosenthalD.  On: 07/17/2021 13:30     ASSESSMENT/PLAN:  This is a very pleasant 81 year old Caucasian male diagnosed with stage IV (T1b, N2, M1A) non-small cell lung cancer, adenocarcinoma.  The patient presented with a left upper lobe lung nodule in addition to mediastinal lymphadenopathy and pleural-based metastasis as well as a malignant left pleural effusion.  The patient was diagnosed in July 2022.  The patient also had a metastatic brain lesion.   The patient completed SRS to the brain lesion on 09/03/2020 under the care of Dr. [name].   The patient is currently undergoing systemic chemotherapy with carboplatin for an AUC of  5, Alimta 500 mg per metered squared, Keytruda 200 mg IV every 3 weeks.  The patient is status post 16 cycles.  Starting from cycle #5, the patient started maintenance Alimta and Keytruda  On the patient's restaging CT scan from 04/23/2021, the patient had an enlarging left upper lobe nodule.  The patient is undergoing SBRT to this lesion in [place].  This was completed in June 2023  The patient is also being followed by radiation oncology neurooncology regarding a solitary brain metastasis.  He is followed closely by Dr. Mickeal Skinner and is expecting to see him in a few months.  The patient is also followed by pulmonology in [place] he is expected to see them tomorrow.  The patient recently had a restaging CT scan performed.  Dr. Julien Nordmann personally and independently reviewed the scan discussed results with the patient today.  The scan showed diminished size in the left upper lobe pulmonary nodule for which the patient recently completed radiation for.  There is associated increased patchy areas of airspace opacity in the peripheral left upper lobe which is likely inflammatory or infectious.  Dr. Julien Nordmann feels this is likely inflammatory changes secondary to his recent radiation changes.  The patient also has stable chronic pulmonary interstitial fibrosis.  Due to the patient's increased fatigue, dyspnea, cough, and anemia, I reviewed the patient's symptoms with Dr. Julien Nordmann.  Dr. Julien Nordmann has elected to give the patient the option of skipping treatment today to allow more time to recover from his worsening anemia and to recover from his recent radiation treatment/last chemotherapy.  The patient is in agreement with this.  Additionally Dr. Julien Nordmann recommended that we send a Medrol Dosepak to the pharmacy to help with his laboratory changes in his lung from his recent radiation, help his appetite, and improve his fatigue.  I advised the patient to take this first thing in the morning.  We  reviewed effects of steroids.  We will also refill his Synthroid today once I have the results of his TSH.  The patient does have some worsening anemia today.  The patient denies any abnormal bleeding or bruising.  We will continue to monitor.  Hopefully this will improve with the treatment break for this cycle.  He will follow-up with his pulmonologist as scheduled tomorrow regarding his questions to see if he qualifies for oxygen during the daytime.  His oxygen is 97% on room air.  He also has chronic pulmonary interstitial fibrosis.  Regarding the patient's decreased appetite, the patient did not have evidence of thrush today.  We will see him back for follow-up visit in 3 weeks for reevaluation before resuming his treatment with cycle #17 of Keytruda and Alimta.  The patient was advised to call immediately if she has any concerning symptoms in the interval. The patient voices understanding of current disease status and treatment options and is in  agreement with the current care plan. All questions were answered. The patient knows to call the clinic with any problems, questions or concerns. We can certainly see the patient much sooner if necessary    No orders of the defined types were placed in this encounter.     Kyliee Ortego L Aleese Kamps, PA-C 07/21/21  ADDENDUM: Hematology/Oncology Attending: I had a face-to-face encounter with the patient today.  I reviewed his record, lab, scan and recommended his care plan.  This is a very pleasant 81 years old white male with a stage IV non-small cell lung cancer, adenocarcinoma diagnosed in July 2022 actionable mutations.  The patient started treatment with SRS to brain metastasis.  He started systemic chemotherapy with carboplatin, Alimta and Keytruda for 4 cycles followed by maintenance treatment with Alimta and Keytruda for 12 more cycles.  He recently received palliative radiotherapy to enlarging left upper lobe lung mass in [place] completed in June 2023.  The patient has been complaining of increasing fatigue and weakness in addition to the swelling of the face secondary to treatment with the steroids. He had repeat CT scan of the chest, abdomen and pelvis performed recently.  I personally and independently reviewed the scan and discussed the result with the patient and his wife.  His scan showed decreased size of the left upper lobe pulmonary nodule with increased patchy areas of airspace opacity in the peripheral left upper lobe likely secondary to inflammatory or infectious etiology but this is likely from the radiation treatment.  There was no evidence for disease progression or metastasis in the abdomen or pelvic area. I recommended for the patient to continue his treatment with maintenance Alimta and Keytruda but because of the fatigue and weakness he would like to take a break of the treatment for at least 1 cycle.  I honored his request and we will skip his treatment today.  He will come back for follow-up visit in 3 weeks for evaluation before the next cycle of his treatment. The patient was advised to call immediately if he has any other concerning symptoms in the interval. The total time spent in the appointment was 30 minutes. Disclaimer: This note was dictated with voice recognition software. Similar sounding words can inadvertently be transcribed and may be missed upon review. Eilleen Kempf, MD

## 2021-07-16 ENCOUNTER — Ambulatory Visit (HOSPITAL_COMMUNITY)
Admission: RE | Admit: 2021-07-16 | Discharge: 2021-07-16 | Disposition: A | Discharge status: Home / Self Care | Payer: Medicare HMO | Source: Ambulatory Visit | Attending: Internal Medicine | Admitting: Internal Medicine

## 2021-07-16 DIAGNOSIS — C3490 Malignant neoplasm of unspecified part of unspecified bronchus or lung: Secondary | ICD-10-CM | POA: Diagnosis present

## 2021-07-16 MED ORDER — HEPARIN SOD (PORK) LOCK FLUSH 100 UNIT/ML IV SOLN
INTRAVENOUS | Status: AC
  Administered 2021-07-16: 500 [IU]
  Filled 2021-07-16: qty 5

## 2021-07-16 MED ORDER — SODIUM CHLORIDE (PF) 0.9 % IJ SOLN
INTRAMUSCULAR | Status: AC
  Filled 2021-07-16: qty 50

## 2021-07-16 MED ORDER — IOHEXOL 300 MG/ML  SOLN
100.0000 mL | Freq: Once | INTRAMUSCULAR | Status: AC | PRN
  Administered 2021-07-16: 100 mL via INTRAVENOUS

## 2021-07-20 ENCOUNTER — Encounter: Payer: Self-pay | Admitting: Internal Medicine

## 2021-07-20 ENCOUNTER — Telehealth: Payer: Self-pay | Admitting: Medical Oncology

## 2021-07-20 NOTE — Telephone Encounter (Signed)
Faxed recent CT and MRI scan reports.

## 2021-07-21 ENCOUNTER — Other Ambulatory Visit: Payer: Self-pay | Admitting: Physician Assistant

## 2021-07-21 ENCOUNTER — Inpatient Hospital Stay: Payer: Medicare HMO

## 2021-07-21 ENCOUNTER — Other Ambulatory Visit: Payer: Medicare HMO

## 2021-07-21 ENCOUNTER — Inpatient Hospital Stay: Payer: Medicare HMO | Attending: Internal Medicine | Admitting: Physician Assistant

## 2021-07-21 ENCOUNTER — Other Ambulatory Visit: Payer: Self-pay

## 2021-07-21 VITALS — BP 138/80 | HR 70 | Temp 97.4°F | Resp 18 | Ht 73.0 in | Wt 180.9 lb

## 2021-07-21 DIAGNOSIS — E039 Hypothyroidism, unspecified: Secondary | ICD-10-CM

## 2021-07-21 DIAGNOSIS — C7931 Secondary malignant neoplasm of brain: Secondary | ICD-10-CM | POA: Diagnosis not present

## 2021-07-21 DIAGNOSIS — C782 Secondary malignant neoplasm of pleura: Secondary | ICD-10-CM | POA: Diagnosis not present

## 2021-07-21 DIAGNOSIS — J8410 Pulmonary fibrosis, unspecified: Secondary | ICD-10-CM | POA: Diagnosis not present

## 2021-07-21 DIAGNOSIS — C3492 Malignant neoplasm of unspecified part of left bronchus or lung: Secondary | ICD-10-CM

## 2021-07-21 DIAGNOSIS — Z79899 Other long term (current) drug therapy: Secondary | ICD-10-CM | POA: Diagnosis not present

## 2021-07-21 DIAGNOSIS — Z95828 Presence of other vascular implants and grafts: Secondary | ICD-10-CM | POA: Diagnosis not present

## 2021-07-21 DIAGNOSIS — J910 Malignant pleural effusion: Secondary | ICD-10-CM | POA: Insufficient documentation

## 2021-07-21 DIAGNOSIS — C3412 Malignant neoplasm of upper lobe, left bronchus or lung: Secondary | ICD-10-CM | POA: Diagnosis not present

## 2021-07-21 DIAGNOSIS — R5383 Other fatigue: Secondary | ICD-10-CM | POA: Diagnosis not present

## 2021-07-21 DIAGNOSIS — D6481 Anemia due to antineoplastic chemotherapy: Secondary | ICD-10-CM | POA: Insufficient documentation

## 2021-07-21 LAB — CBC WITH DIFFERENTIAL (CANCER CENTER ONLY)
Abs Immature Granulocytes: 0.02 10*3/uL (ref 0.00–0.07)
Basophils Absolute: 0 10*3/uL (ref 0.0–0.1)
Basophils Relative: 1 %
Eosinophils Absolute: 0.4 10*3/uL (ref 0.0–0.5)
Eosinophils Relative: 8 %
HCT: 27.9 % — ABNORMAL LOW (ref 39.0–52.0)
Hemoglobin: 9.5 g/dL — ABNORMAL LOW (ref 13.0–17.0)
Immature Granulocytes: 0 %
Lymphocytes Relative: 30 %
Lymphs Abs: 1.6 10*3/uL (ref 0.7–4.0)
MCH: 34.8 pg — ABNORMAL HIGH (ref 26.0–34.0)
MCHC: 34.1 g/dL (ref 30.0–36.0)
MCV: 102.2 fL — ABNORMAL HIGH (ref 80.0–100.0)
Monocytes Absolute: 0.9 10*3/uL (ref 0.1–1.0)
Monocytes Relative: 17 %
Neutro Abs: 2.3 10*3/uL (ref 1.7–7.7)
Neutrophils Relative %: 44 %
Platelet Count: 220 10*3/uL (ref 150–400)
RBC: 2.73 MIL/uL — ABNORMAL LOW (ref 4.22–5.81)
RDW: 18.2 % — ABNORMAL HIGH (ref 11.5–15.5)
WBC Count: 5.2 10*3/uL (ref 4.0–10.5)
nRBC: 0 % (ref 0.0–0.2)

## 2021-07-21 LAB — CMP (CANCER CENTER ONLY)
ALT: 7 U/L (ref 0–44)
AST: 22 U/L (ref 15–41)
Albumin: 3.7 g/dL (ref 3.5–5.0)
Alkaline Phosphatase: 81 U/L (ref 38–126)
Anion gap: 4 — ABNORMAL LOW (ref 5–15)
BUN: 12 mg/dL (ref 8–23)
CO2: 29 mmol/L (ref 22–32)
Calcium: 9.4 mg/dL (ref 8.9–10.3)
Chloride: 106 mmol/L (ref 98–111)
Creatinine: 0.98 mg/dL (ref 0.61–1.24)
GFR, Estimated: >60 mL/min (ref >60)
Glucose, Bld: 95 mg/dL (ref 70–99)
Potassium: 4.3 mmol/L (ref 3.5–5.1)
Sodium: 139 mmol/L (ref 135–145)
Total Bilirubin: 0.5 mg/dL (ref 0.3–1.2)
Total Protein: 6.3 g/dL — ABNORMAL LOW (ref 6.5–8.1)

## 2021-07-21 LAB — TSH: TSH: 2.475 u[IU]/mL (ref 0.350–4.500)

## 2021-07-21 MED ORDER — SODIUM CHLORIDE 0.9% FLUSH
10.0000 mL | Freq: Once | INTRAVENOUS | Status: AC
  Administered 2021-07-21: 10 mL

## 2021-07-21 MED ORDER — HEPARIN SOD (PORK) LOCK FLUSH 100 UNIT/ML IV SOLN
500.0000 [IU] | Freq: Once | INTRAVENOUS | Status: AC
  Administered 2021-07-21: 500 [IU]

## 2021-07-21 MED ORDER — LEVOTHYROXINE SODIUM 25 MCG PO TABS: 25.0000 ug | ORAL_TABLET | Freq: Every day | ORAL | 2 refills | Status: DC

## 2021-07-21 MED ORDER — METHYLPREDNISOLONE 4 MG PO TBPK: ORAL_TABLET | ORAL | 0 refills | Status: DC

## 2021-07-21 NOTE — Progress Notes (Signed)
Pt states that he is on 2L O2  at night

## 2021-07-21 NOTE — Progress Notes (Signed)
No treatment today. Port deaccessed

## 2021-07-22 ENCOUNTER — Encounter: Payer: Self-pay | Admitting: Internal Medicine

## 2021-07-23 ENCOUNTER — Other Ambulatory Visit: Payer: Self-pay | Admitting: Radiation Therapy

## 2021-07-27 ENCOUNTER — Other Ambulatory Visit: Payer: Self-pay

## 2021-07-29 ENCOUNTER — Ambulatory Visit
Admission: RE | Admit: 2021-07-29 | Discharge: 2021-07-29 | Disposition: A | Discharge status: Home / Self Care | Payer: Medicare HMO | Source: Ambulatory Visit | Attending: Internal Medicine | Admitting: Internal Medicine

## 2021-07-29 DIAGNOSIS — C7931 Secondary malignant neoplasm of brain: Secondary | ICD-10-CM

## 2021-07-29 MED ORDER — GADOBENATE DIMEGLUMINE 529 MG/ML IV SOLN
17.0000 mL | Freq: Once | INTRAVENOUS | Status: AC | PRN
  Administered 2021-07-29: 17 mL via INTRAVENOUS

## 2021-07-29 MED ORDER — SODIUM CHLORIDE 0.9% FLUSH
10.0000 mL | INTRAVENOUS | Status: DC | PRN
  Administered 2021-07-29: 10 mL via INTRAVENOUS

## 2021-07-29 MED ORDER — HEPARIN SOD (PORK) LOCK FLUSH 100 UNIT/ML IV SOLN
500.0000 [IU] | Freq: Once | INTRAVENOUS | Status: AC
Start: 2021-07-29 — End: 2021-07-29
  Administered 2021-07-29: 500 [IU] via INTRAVENOUS

## 2021-08-03 ENCOUNTER — Inpatient Hospital Stay: Payer: Medicare HMO

## 2021-08-04 ENCOUNTER — Inpatient Hospital Stay: Payer: Medicare HMO | Attending: Internal Medicine | Admitting: Internal Medicine

## 2021-08-04 ENCOUNTER — Telehealth: Payer: Self-pay | Admitting: Radiation Therapy

## 2021-08-04 ENCOUNTER — Other Ambulatory Visit: Payer: Self-pay

## 2021-08-04 VITALS — BP 127/79 | HR 78 | Temp 98.6°F | Resp 18 | Ht 73.0 in | Wt 179.1 lb

## 2021-08-04 DIAGNOSIS — Z5111 Encounter for antineoplastic chemotherapy: Secondary | ICD-10-CM | POA: Diagnosis present

## 2021-08-04 DIAGNOSIS — Z5112 Encounter for antineoplastic immunotherapy: Secondary | ICD-10-CM | POA: Insufficient documentation

## 2021-08-04 DIAGNOSIS — C3412 Malignant neoplasm of upper lobe, left bronchus or lung: Secondary | ICD-10-CM | POA: Diagnosis not present

## 2021-08-04 DIAGNOSIS — C782 Secondary malignant neoplasm of pleura: Secondary | ICD-10-CM | POA: Insufficient documentation

## 2021-08-04 DIAGNOSIS — C7931 Secondary malignant neoplasm of brain: Secondary | ICD-10-CM | POA: Diagnosis not present

## 2021-08-04 DIAGNOSIS — Z79899 Other long term (current) drug therapy: Secondary | ICD-10-CM | POA: Insufficient documentation

## 2021-08-04 MED ORDER — DEXAMETHASONE 2 MG PO TABS: ORAL_TABLET | ORAL | 0 refills | Status: DC

## 2021-08-04 NOTE — Progress Notes (Signed)
[facility] [address] [place], [place] [address] [phone]   Interval Evaluation  Date of Service: 08/04/21 Patient Name: Peter Brown Patient MRN: [id number] Patient DOB: Sep 28, 1940 Provider: Ventura Sellers, MD  Identifying Statement:  Peter Brown is a 81 y.o. 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[facility])   Primary Cancer:  Oncologic History: Oncology History  Adenocarcinoma, lung, left ([place])  08/04/2020 Initial Diagnosis   Adenocarcinoma of left lung, stage 4 ([place])   08/04/2020 Cancer Staging   Staging form: Lung, AJCC 8th Edition - Clinical: Stage IVA (cT1b, cN2, cM1a) - Signed by Curt Bears, MD on 08/04/2020   08/13/2020 -  Chemotherapy   Patient is on Treatment Plan : LUNG CARBOplatin / Pemetrexed / Pembrolizumab q21d Induction x 4 cycles / Maintenance Pemetrexed + Pembrolizumab      CNS Oncologic History 08/25/20: SRS to L temporal metastasis [name])  Interval History: Peter Brown presents for follow up today after recent MRI brain.  He and his wife describe a return of the expressive language and short term memory difficulties he experienced in the springtime.  No issues with the scan.  He remains mostly independent with function, now undergoing chemotherapy infusion with Dr. Julien Nordmann.  Not on decadron currently.  H+P (05/12/21) Patient presented to neurologic attention last week with new onset confusion.  His wife describes him forgetting names, choosing the wrong word, answering simple questions incorrectly.  Onset was over several days.  No seizure activity was witnessed.  No issues with his gait, no right sided weakness.  He denies headaches.  Decadron was started on 5/1 by rad onc, 4mg  twice per day.  He describes significant improvement, very close to his prior baseline, over the past 8 days.  He mowed his lawn this weekend, is otherwise independent with activities of living.  On Pem/Pem with Dr. Julien Nordmann, doing  well.  Medications: Current Outpatient Medications on File Prior to Visit  Medication Sig Dispense Refill   albuterol (VENTOLIN HFA) 108 (90 Base) MCG/ACT inhaler Inhale 2 puffs into the lungs every 4 (four) hours as needed. Every 4-6 hours     aspirin 81 MG EC tablet Take by mouth.     Bacillus Coagulans-Inulin (PROBIOTIC) 1-250 BILLION-MG CAPS Take 1 tablet by mouth daily.     bisacodyl (DULCOLAX) 5 MG EC tablet Take 5 mg by mouth 3 (three) times daily as needed for moderate constipation.     Cholecalciferol (D3 PO) Take 1 tablet by mouth daily.     coal tar-salicylic acid 2 % shampoo Apply topically daily as needed for itching.     DULoxetine (CYMBALTA) 30 MG capsule Take 30 mg by mouth daily.     fexofenadine (ALLEGRA) 180 MG tablet Take 180 mg by mouth daily.     fluocinonide cream (LIDEX) 3.29 % Apply 1 application topically 2 (two) times daily.     folic acid (FOLVITE) 1 MG tablet TAKE 1 TABLET(1 MG) BY MOUTH DAILY 30 tablet 4   hydrocortisone cream 1 % Apply 1 application topically 2 (two) times daily. 453.6 g 0   ketoconazole (NIZORAL) 2 % cream Apply 1 application topically daily.     Krill Oil (OMEGA-3) 500 MG CAPS Take by mouth.     levothyroxine (SYNTHROID) 25 MCG tablet Take 1 tablet (25 mcg total) by mouth daily before breakfast. 30 tablet 2   lidocaine-prilocaine (EMLA) cream Apply 1 application topically as needed. 30 g 1   Magnesium 250  MG TABS Take by mouth.     methylPREDNISolone (MEDROL DOSEPAK) 4 MG TBPK tablet Use as instructed 21 tablet 0   Niacin (VITAMIN B-3 PO) Take 1 tablet by mouth daily.     omeprazole (PRILOSEC) 40 MG capsule Take 40 mg by mouth daily.     ondansetron (ZOFRAN-ODT) 4 MG disintegrating tablet Take 1 tablet (4 mg total) by mouth every 8 (eight) hours as needed. 20 tablet 0   prochlorperazine (COMPAZINE) 10 MG tablet TAKE 1 TABLET(10 MG) BY MOUTH EVERY 6 HOURS AS NEEDED FOR NAUSEA OR VOMITING 30 tablet 0   triamcinolone cream (KENALOG) 0.1 % Apply  1 application topically daily as needed. 453 g 6   No current facility-administered medications on file prior to visit.    Allergies: No Active Allergies Past Medical History:  Past Medical History:  Diagnosis Date   Lung cancer ([place])    Lung cancer metastatic to brain [facility])    Past Surgical History:  Past Surgical History:  Procedure Laterality Date   IR IMAGING GUIDED PORT INSERTION  11/24/2020   KNEE SURGERY     orthoscopic     TONSILLECTOMY AND ADENOIDECTOMY     Social History:  Social History   Socioeconomic History   Marital status: Married    Spouse name: Not on file   Number of children: Not on file   Years of education: Not on file   Highest education level: Not on file  Occupational History   Not on file  Tobacco Use   Smoking status: Never   Smokeless tobacco: Never  Substance and Sexual Activity   Alcohol use: Never   Drug use: Never   Sexual activity: Not on file  Other Topics Concern   Not on file  Social History Narrative   Not on file   Social Determinants of Health   Financial Resource Strain: Low Risk  (08/22/2020)   Overall Financial Resource Strain (CARDIA)    Difficulty of Paying Living Expenses: Not very hard  Food Insecurity: No Food Insecurity (08/22/2020)   Hunger Vital Sign    Worried About Running Out of Food in the Last Year: Never true    Ran Out of Food in the Last Year: Never true  Transportation Needs: No Transportation Needs (08/22/2020)   PRAPARE - Hydrologist (Medical): No    Lack of Transportation (Non-Medical): No  Physical Activity: Not on file  Stress: Stress Concern Present (08/22/2020)   [place]    Feeling of Stress : To some extent  Social Connections: Socially Integrated (08/22/2020)   Social Connection and Isolation Panel [NHANES]    Frequency of Communication with Friends and Family: More than three times a week     Frequency of Social Gatherings with Friends and Family: Once a week    Attends Religious Services: More than 4 times per year    Active Member of [employer] or Organizations: Yes    Attends Music therapist: More than 4 times per year    Marital Status: Married  Human resources officer Violence: Not on file   Family History: No family history on file.  Review of Systems: Constitutional: Doesn't report fevers, chills or abnormal weight loss Eyes: Doesn't report blurriness of vision Ears, nose, mouth, throat, and face: Doesn't report sore throat Respiratory: Doesn't report cough, dyspnea or wheezes Cardiovascular: Doesn't report palpitation, chest discomfort  Gastrointestinal:  Doesn't report nausea, constipation, diarrhea GU: Doesn't  report incontinence Skin: Doesn't report skin rashes Neurological: Per HPI Musculoskeletal: Doesn't report joint pain Behavioral/Psych: Doesn't report anxiety  Physical Exam: Vitals:   08/04/21 1103  BP: 127/79  Pulse: 78  Resp: 18  Temp: 98.6 F (37 C)  SpO2: 94%    KPS: 70. General: Alert, cooperative, pleasant, in no acute distress Head: Normal EENT: No conjunctival injection or scleral icterus.  Lungs: Resp effort normal Cardiac: Regular rate Abdomen: Non-distended abdomen Skin: No rashes cyanosis or petechiae. Extremities: No clubbing or edema  Neurologic Exam: Mental Status: Awake, alert, attentive to examiner. Oriented to self and environment. Language is notable for impaired fluency.  Impaired short term memory, recall. Cranial Nerves: Visual acuity is grossly normal. Visual fields are full. Extra-ocular movements intact. No ptosis. Face is symmetric Motor: Tone and bulk are normal. Power is full in both arms and legs. Reflexes are symmetric, no pathologic reflexes present.  Sensory: Intact to light touch Gait: Normal.   Labs: I have reviewed the data as listed    Component Value Date/Time   NA 139 07/21/2021 1112   K 4.3  07/21/2021 1112   CL 106 07/21/2021 1112   CO2 29 07/21/2021 1112   GLUCOSE 95 07/21/2021 1112   BUN 12 07/21/2021 1112   CREATININE 0.98 07/21/2021 1112   CALCIUM 9.4 07/21/2021 1112   PROT 6.3 (L) 07/21/2021 1112   ALBUMIN 3.7 07/21/2021 1112   AST 22 07/21/2021 1112   ALT 7 07/21/2021 1112   ALKPHOS 81 07/21/2021 1112   BILITOT 0.5 07/21/2021 1112   GFRNONAA >60 07/21/2021 1112   Lab Results  Component Value Date   WBC 5.2 07/21/2021   NEUTROABS 2.3 07/21/2021   HGB 9.5 (L) 07/21/2021   HCT 27.9 (L) 07/21/2021   MCV 102.2 (H) 07/21/2021   PLT 220 07/21/2021    Imaging:  MR BRAIN W WO CONTRAST  Result Date: 07/29/2021 CLINICAL DATA:  Brain/CNS neoplasm, assess treatment response. Metastatic lung cancer. Prior SRS to a left temporal lesion. EXAM: MRI HEAD WITHOUT AND WITH CONTRAST TECHNIQUE: Multiplanar, multiecho pulse sequences of the brain and surrounding structures were obtained without and with intravenous contrast. CONTRAST:  10mL MULTIHANCE GADOBENATE DIMEGLUMINE 529 MG/ML IV SOLN COMPARISON:  Head MRI 06/12/2021 FINDINGS: Brain: New lesions: None. Larger lesions: 1. 4 mm enhancing lesion in the cerebellar vermis which in retrospect was subtly visible on the prior study but has increased in size (series 11, image 43). No edema. 2. 3 mm enhancing lesion in the medial right occipital lobe, also subtly visible on the prior study but with interval increase in size (series 11, image 70). No edema. 3. Increased size of the previously treated, centrally necrotic left temporal lobe lesion which now measures 3.1 x 2.6 cm with greatly increased, extensive surrounding vasogenic edema. Stable or smaller lesions: None. Other brain findings: Left temporal lobe edema results in sulcal effacement and effacement of the temporal horn of the left lateral ventricle. There is at most trace rightward midline shift. There is no evidence of hydrocephalus, acute infarct, acute intracranial hemorrhage, or  extra-axial fluid collection. There is mild cerebral atrophy. Vascular: Major intracranial vascular flow voids are preserved. Skull and upper cervical spine: No suspicious marrow lesion. Sinuses/Orbits: Unremarkable orbits. No significant inflammatory disease in the paranasal sinuses. Trace left mastoid effusion. Other: None. IMPRESSION: 1. 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 2. Increased size of 2 previously unreported  lesions measuring 3-4 mm in the right occipital lobe and cerebellar vermis consistent with metastases. No edema. Electronically Signed   By: Logan Bores M.D.   On: 07/29/2021 14:34   CT Chest W Contrast  Result Date: 07/17/2021 CLINICAL DATA:  Follow-up metastatic left lung adenocarcinoma. Assess treatment response. * Tracking Code: BO * EXAM: CT CHEST, ABDOMEN, AND PELVIS WITH CONTRAST TECHNIQUE: Multidetector CT imaging of the chest, abdomen and pelvis was performed following the standard protocol during bolus administration of intravenous contrast. RADIATION DOSE REDUCTION: This exam was performed according to the departmental dose-optimization program which includes automated exposure control, adjustment of the mA and/or kV according to patient size and/or use of iterative reconstruction technique. CONTRAST:  161mL OMNIPAQUE IOHEXOL 300 MG/ML  SOLN COMPARISON:  04/23/2021 FINDINGS: CT CHEST FINDINGS Cardiovascular: No acute findings. Aortic and coronary atherosclerotic calcification incidentally noted. Mediastinum/Lymph Nodes: No masses or pathologically enlarged lymph nodes identified. Lungs/Pleura: Chronic interstitial fibrosis again demonstrated in the subpleural lung zones. Pulmonary nodule in the anteromedial left upper lobe has decreased in size, currently measuring 6 x 6 mm on image 29/2, compared to 10 x 9 mm previously. Several patchy areas of airspace  opacity in the peripheral left upper lobe have increased since previous study, likely due to infectious or inflammatory etiology. No new or enlarging solid pulmonary nodules identified. No evidence pleural effusion. Musculoskeletal:  No suspicious bone lesions identified. CT ABDOMEN AND PELVIS FINDINGS Hepatobiliary: No masses identified. Gallbladder is unremarkable. No evidence of biliary ductal dilatation. Pancreas:  No mass or inflammatory changes. Spleen:  Within normal limits in size and appearance. Adrenals/Urinary tract:  No masses or hydronephrosis. Stomach/Bowel: No evidence of obstruction, inflammatory process, or abnormal fluid collections. Diffuse colonic diverticulosis again noted, however there is no evidence of diverticulitis. Normal appendix visualized. Vascular/Lymphatic: No pathologically enlarged lymph nodes identified. No acute vascular findings. Aortic atherosclerotic calcification incidentally noted. Reproductive:  No mass or other significant abnormality identified. Other:  None. Musculoskeletal: No suspicious bone lesions identified. 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 IMPRESSION: Decreased size of left upper lobe pulmonary nodule. Increased patchy areas of airspace opacity in peripheral left upper lobe, most likely due to inflammatory or infectious etiology. 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 No evidence of abdominal or pelvic metastatic disease. Colonic diverticulosis, without radiographic evidence of diverticulitis. Pulmonary interstitial fibrosis. Aortic Atherosclerosis (ICD10-I70.0). Electronically Signed   By: Marlaine Hind M.D.   On: 07/17/2021 13:30   CT Abdomen Pelvis W Contrast  Result Date: 07/17/2021 CLINICAL DATA:  Follow-up metastatic left lung adenocarcinoma. Assess treatment response. * Tracking Code: BO * EXAM: CT CHEST, ABDOMEN, AND PELVIS WITH CONTRAST TECHNIQUE:  Multidetector CT imaging of the chest, abdomen and pelvis was performed following the standard protocol during bolus administration of intravenous contrast. RADIATION DOSE REDUCTION: This exam was performed according to the departmental dose-optimization program which includes automated exposure control, adjustment of the mA and/or kV according to patient size and/or use of iterative reconstruction technique. CONTRAST:  140mL OMNIPAQUE IOHEXOL 300 MG/ML  SOLN COMPARISON:  04/23/2021 FINDINGS: CT CHEST FINDINGS Cardiovascular: No acute findings. Aortic and coronary atherosclerotic calcification incidentally noted. Mediastinum/Lymph Nodes: No masses or pathologically enlarged lymph nodes identified. Lungs/Pleura: Chronic interstitial fibrosis again demonstrated in the subpleural lung zones. Pulmonary nodule in the anteromedial left upper lobe has decreased in size, currently measuring 6 x 6 mm on image 29/2, compared to 10 x 9 mm previously. Several patchy areas of  airspace opacity in the peripheral left upper lobe have increased since previous study, likely due to infectious or inflammatory etiology. No new or enlarging solid pulmonary nodules identified. No evidence pleural effusion. Musculoskeletal:  No suspicious bone lesions identified. CT ABDOMEN AND PELVIS FINDINGS Hepatobiliary: No masses identified. Gallbladder is unremarkable. No evidence of biliary ductal dilatation. Pancreas:  No mass or inflammatory changes. Spleen:  Within normal limits in size and appearance. Adrenals/Urinary tract:  No masses or hydronephrosis. Stomach/Bowel: No evidence of obstruction, inflammatory process, or abnormal fluid collections. Diffuse colonic diverticulosis again noted, however there is no evidence of diverticulitis. Normal appendix visualized. Vascular/Lymphatic: No pathologically enlarged lymph nodes identified. No acute vascular findings. Aortic atherosclerotic calcification incidentally noted. Reproductive:  No mass or  other significant abnormality identified. Other:  None. Musculoskeletal: No suspicious bone lesions identified. 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 IMPRESSION: Decreased size of left upper lobe pulmonary nodule. Increased patchy areas of airspace opacity in peripheral left upper lobe, most likely due to inflammatory or infectious etiology. 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 No evidence of abdominal or pelvic metastatic disease. Colonic diverticulosis, without radiographic evidence of diverticulitis. Pulmonary interstitial fibrosis. Aortic Atherosclerosis (ICD10-I70.0). Electronically Signed   By: Marlaine Hind M.D.   On: 07/17/2021 13:30    [place] Clinician Interpretation: I have personally reviewed the radiological images as listed.  My interpretation, in the context of the patient's clinical presentation, is treatment effect vs true progression   Assessment/Plan Malignant neoplasm metastatic to brain [facility])  Peter Brown is clinically progressive today, with noted dysphasia and amnesia.  MRI brain demonstrates progressive changes within previously treated left temporal site; this may be partly secondary to withdrawal of dexamethasone.  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  In addition, there are two sub-cm metastases which are new, although in hindsight were faintly visible on prior study.  He is agreeable with radiosurgery to address these lesions.    We will set him up to see Dr. [name] and radiation oncology team.  We recommended resuming decadron, 4mg  daily x7 days, then 2mg  daily thereafter.  He will continue to undergo carbo/pem/pem with Dr. Julien Nordmann in the interim.  We appreciate the opportunity to participate in the care of Peter Brown.   We ask that Peter Brown return to clinic in 2 months following next brain MRI, or sooner as needed.  All questions were answered. The  patient knows to call the clinic with any problems, questions or concerns. No barriers to learning were detected.  The total time spent in the encounter was 30 minutes and more than 50% was on counseling and review of test results   Ventura Sellers, MD Medical Director of Neuro-Oncology [facility] at [place] 08/04/21 10:54 AM

## 2021-08-04 NOTE — Telephone Encounter (Signed)
Spoke with Peter Brown about Ankith's upcoming radiation treatment planning appointments and Recon with Bryson Ha on 8/7. Peter Brown and Grabiel had already seen Dr. Mickeal Skinner and reviewed his recent MRI results. They are aware of the two new lesions and in agreement with moving forward with salvage SRS.   Mont Dutton R.T.(R)(T) Radiation Special Procedures Navigator

## 2021-08-05 ENCOUNTER — Other Ambulatory Visit: Payer: Self-pay

## 2021-08-06 ENCOUNTER — Other Ambulatory Visit: Payer: Self-pay

## 2021-08-06 ENCOUNTER — Telehealth: Payer: Self-pay | Admitting: Internal Medicine

## 2021-08-06 NOTE — Telephone Encounter (Signed)
Scheduled per 8/1 los, pt has been called and wife confirmed

## 2021-08-07 ENCOUNTER — Other Ambulatory Visit: Payer: Self-pay

## 2021-08-10 ENCOUNTER — Other Ambulatory Visit: Payer: Self-pay

## 2021-08-10 ENCOUNTER — Ambulatory Visit
Admission: RE | Admit: 2021-08-10 | Discharge: 2021-08-10 | Disposition: A | Discharge status: Home / Self Care | Payer: Medicare HMO | Source: Ambulatory Visit | Attending: Radiation Oncology | Admitting: Radiation Oncology

## 2021-08-10 ENCOUNTER — Ambulatory Visit
Admission: RE | Admit: 2021-08-10 | Discharge: 2021-08-10 | Disposition: A | Discharge status: Home / Self Care | Payer: Medicare HMO | Source: Ambulatory Visit

## 2021-08-10 ENCOUNTER — Encounter: Payer: Self-pay | Admitting: Radiation Oncology

## 2021-08-10 VITALS — BP 103/83 | HR 72 | Temp 97.8°F | Resp 18 | Ht 73.0 in | Wt 179.0 lb

## 2021-08-10 DIAGNOSIS — M4317 Spondylolisthesis, lumbosacral region: Secondary | ICD-10-CM | POA: Diagnosis not present

## 2021-08-10 DIAGNOSIS — Z79890 Hormone replacement therapy (postmenopausal): Secondary | ICD-10-CM | POA: Diagnosis not present

## 2021-08-10 DIAGNOSIS — C7931 Secondary malignant neoplasm of brain: Secondary | ICD-10-CM | POA: Insufficient documentation

## 2021-08-10 DIAGNOSIS — M5137 Other intervertebral disc degeneration, lumbosacral region: Secondary | ICD-10-CM | POA: Insufficient documentation

## 2021-08-10 DIAGNOSIS — I6789 Other cerebrovascular disease: Secondary | ICD-10-CM | POA: Insufficient documentation

## 2021-08-10 DIAGNOSIS — Z923 Personal history of irradiation: Secondary | ICD-10-CM | POA: Diagnosis not present

## 2021-08-10 DIAGNOSIS — Z79899 Other long term (current) drug therapy: Secondary | ICD-10-CM | POA: Diagnosis not present

## 2021-08-10 DIAGNOSIS — Z9221 Personal history of antineoplastic chemotherapy: Secondary | ICD-10-CM | POA: Diagnosis not present

## 2021-08-10 DIAGNOSIS — C3412 Malignant neoplasm of upper lobe, left bronchus or lung: Secondary | ICD-10-CM | POA: Insufficient documentation

## 2021-08-10 DIAGNOSIS — K5730 Diverticulosis of large intestine without perforation or abscess without bleeding: Secondary | ICD-10-CM | POA: Insufficient documentation

## 2021-08-10 DIAGNOSIS — C3492 Malignant neoplasm of unspecified part of left bronchus or lung: Secondary | ICD-10-CM

## 2021-08-10 MED ORDER — HEPARIN SOD (PORK) LOCK FLUSH 100 UNIT/ML IV SOLN
500.0000 [IU] | Freq: Once | INTRAVENOUS | Status: AC
  Administered 2021-08-10: 500 [IU] via INTRAVENOUS

## 2021-08-10 MED ORDER — SODIUM CHLORIDE 0.9% FLUSH
10.0000 mL | Freq: Once | INTRAVENOUS | Status: AC
  Administered 2021-08-10: 10 mL via INTRAVENOUS

## 2021-08-10 NOTE — Progress Notes (Signed)
Has armband been applied?  Yes  Does patient have an allergy to IV contrast dye?: No   Has patient ever received premedication for IV contrast dye?:  n/a  Does patient take metformin?: No  If patient does take metformin when was the last dose: n/a  Date of lab work: 07/21/2021 BUN: 12 CR: 0.98 eGfr: >60  IV site: Right Chest Port  Has IV site been added to flowsheet?  Yes

## 2021-08-10 NOTE — Progress Notes (Signed)
Reconsult appointment. I verified patient's identity and began nursing interview w/ spouse Mrs. Wendie Chess in attendance. Patient reports mild headaches, on rare occasion, and some memory loss. No other issues reported at this time.  Meaningful use complete.  BP 103/83 (BP Location: Left Arm, Patient Position: Sitting, Cuff Size: Normal)   Pulse 72   Temp 97.8 F (36.6 C) (Oral)   Resp 18   Ht 6\' 1"  (1.854 m)   Wt 179 lb (81.2 kg)   SpO2 98%   BMI 23.62 kg/m

## 2021-08-10 NOTE — Progress Notes (Signed)
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   HISTORY OF PRESENT ILLNESS: [name] is a 81 y.o. male with a diagnosis of stage IV lung cancer.  The patient had been experiencing left chest wall discomfort and discomfort in the back, he was evaluated with chest x-ray which showed a left pleural effusion.  He was sent for evaluation with IR and underwent thoracentesis with successful drainage of 650 mL of pleural fluid on 07/21/2020.  Final cytology showed malignant cells consistent with adenocarcinoma.  He underwent PET imaging on 08/01/2020 which revealed a 14 mm left upper lobe pulmonary nodule that was hypermetabolic with an SUV of up to 10.1.  AP window adenopathy was hypermetabolic as well as some smaller pleural nodules with ranging SUVs between 3 and 4 consistent with an associated left malignant pleural effusion.  No other evidence of distant disease was identified.  Screening MRI for staging on 08/13/2020 showed a 1 cm enhancing lesion in the left temporal lobe.  He began systemic  chemo immunotherapy on 08/13/2020.  He received single fraction stereotactic radiosurgery on 09/03/20 which he tolerated well.   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  He continues with Alimta and Keytruda with Dr. Julien Nordmann.   He also developed a new nodule in the LUL and proceeded with stereotactic body radiotherapy (SBRT) to this site with Dr. Rhunette Croft in [place]. He did have symptoms of confusion last  weekend and presented to the ED in [place] and was found to have edema in the brain. He was started in [place] on 0.5 mg Dexamethasone, but we changed his dose on 05/04/21 to 4 mg BID of Dexamethasone.  He went for MRI on 05/07/21 that showed the enhancing lesion in the left temporal lobe still increasing in size up to 2 cm, previously 1.2 cm. 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 He's seen today to proceed with SRS to these two new lesions and continues steroid management with Dr. Mickeal Skinner.      PREVIOUS RADIATION THERAPY:   May-June 2023 The patient received 5 fractions of SBRT LUL with Dr. Rhunette Croft at [facility], details unknown.  09/03/2020 through 09/03/2020 SRS Treatment Site Technique Total Dose (Gy) Dose per Fx (Gy) Completed Fx Beam Energies  Brain: Brain [id number] 3D 20/20 20 1/1 [id number]     PAST MEDICAL HISTORY:  Past Medical History:  Diagnosis Date   Lung cancer ([place])    Lung cancer metastatic to brain [facility])        PAST SURGICAL HISTORY: Past Surgical History:  Procedure Laterality Date   IR IMAGING GUIDED PORT INSERTION  11/24/2020   KNEE SURGERY     orthoscopic     TONSILLECTOMY AND ADENOIDECTOMY       FAMILY HISTORY: History reviewed.  No pertinent family history.   SOCIAL HISTORY:  reports that he has never smoked. He has never used smokeless tobacco. He reports that he does not drink alcohol and does not use drugs. The patient is married and lives in [place], [place]. They have 9 grandchildren and enjoy supporting them. One grandson is very active in the theatre.   ALLERGIES: Patient has no active allergies.   MEDICATIONS:  Current Outpatient Medications  Medication Sig Dispense Refill    albuterol (VENTOLIN HFA) 108 (90 Base) MCG/ACT inhaler Inhale 2 puffs into the lungs every 4 (four) hours as needed. Every 4-6 hours     aspirin 81 MG EC tablet Take by mouth.     Bacillus Coagulans-Inulin (PROBIOTIC) 1-250 BILLION-MG CAPS Take 1 tablet by mouth daily.     bisacodyl (DULCOLAX) 5 MG EC tablet Take 5 mg by mouth 3 (three) times daily as needed for moderate constipation.     Cholecalciferol (D3 PO) Take 1 tablet by mouth daily.     coal tar-salicylic acid 2 % shampoo Apply topically daily as needed for itching.     dexamethasone (DECADRON) 2 MG tablet Take 2 tablets (4 mg total) by mouth daily for 7 days, THEN 1 tablet (2 mg total) daily. 74 tablet 0   DULoxetine (CYMBALTA) 30 MG capsule Take 30 mg by mouth daily.     fexofenadine (ALLEGRA) 180 MG tablet Take 180 mg by mouth daily.     fluocinonide cream (LIDEX) 1.61 % Apply 1 application topically 2 (two) times daily.     folic acid (FOLVITE) 1 MG tablet TAKE 1 TABLET(1 MG) BY MOUTH DAILY 30 tablet 4   hydrocortisone cream 1 % Apply 1 application topically 2 (two) times daily. 453.6 g 0   ketoconazole (NIZORAL) 2 % cream Apply 1 application topically daily.     Krill Oil (OMEGA-3) 500 MG CAPS Take by mouth.     levothyroxine (SYNTHROID) 25 MCG tablet Take 1 tablet (25 mcg total) by mouth daily before breakfast. 30 tablet 2   lidocaine-prilocaine (EMLA) cream Apply 1 application topically as needed. 30 g 1   Magnesium 250 MG TABS Take by mouth.     Niacin (VITAMIN B-3 PO) Take 1 tablet by mouth daily.     omeprazole (PRILOSEC) 40 MG capsule Take 40 mg by mouth daily.     ondansetron (ZOFRAN-ODT) 4 MG disintegrating tablet Take 1 tablet (4 mg total) by mouth every 8 (eight) hours as needed. 20 tablet 0   prochlorperazine (COMPAZINE) 10 MG tablet TAKE 1 TABLET(10 MG) BY MOUTH EVERY 6 HOURS AS NEEDED FOR NAUSEA OR VOMITING 30 tablet 0   triamcinolone cream (KENALOG) 0.1 % Apply 1 application topically daily as needed. 453 g 6   No  current facility-administered medications for this encounter.     REVIEW OF SYSTEMS: On review of systems, the patient has still had some episodes of confusion.  His wife states that last week was very difficult for him with episodes of dysarthria and aphasia however the last few days have been a little bit more stable.  Keep does have some vague confusion but long-term memory is intact.  No complaints of headaches nausea or vomiting or changes in movement are appreciated.   PHYSICAL EXAM:  Wt Readings from Last 3 Encounters:  08/10/21 179 lb (81.2 kg)  08/04/21 179 lb 1.6 oz (81.2 kg)  07/21/21 180 lb 14.4 oz (82.1 kg)   Temp Readings from Last 3 Encounters:  08/10/21 97.8 F (  36.6 C) (Oral)  08/04/21 98.6 F (37 C) (Oral)  07/21/21 (!) 97.4 F (36.3 C) (Oral)   BP Readings from Last 3 Encounters:  08/10/21 103/83  08/04/21 127/79  07/21/21 138/80   Pulse Readings from Last 3 Encounters:  08/10/21 72  08/04/21 78  07/21/21 70    In general this is a well appearing caucasian male in no acute distress. He's alert and oriented x4 and appropriate throughout the examination but has some confusion about details related to my credentials and how we previously made his SRS mask. Cardiopulmonary assessment is negative for acute distress and he exhibits normal effort.      ECOG = 1  0 - Asymptomatic (Fully active, able to carry on all predisease activities without restriction)  1 - Symptomatic but completely ambulatory (Restricted in physically strenuous activity but ambulatory and able to carry out work of a light or sedentary nature. For example, light housework, office work)  2 - Symptomatic, <50% in bed during the day (Ambulatory and capable of all self care but unable to carry out any work activities. Up and about more than 50% of waking hours)  3 - Symptomatic, >50% in bed, but not bedbound (Capable of only limited self-care, confined to bed or chair 50% or more of waking  hours)  4 - Bedbound (Completely disabled. Cannot carry on any self-care. Totally confined to bed or chair)  5 - Death   Eustace Pen MM, Creech RH, Tormey DC, et al. [phone]). "Toxicity and response criteria of the [facility]". [place] Oncol. 5 (6): 649-55    LABORATORY DATA:  Lab Results  Component Value Date   WBC 5.2 07/21/2021   HGB 9.5 (L) 07/21/2021   HCT 27.9 (L) 07/21/2021   MCV 102.2 (H) 07/21/2021   PLT 220 07/21/2021   Lab Results  Component Value Date   NA 139 07/21/2021   K 4.3 07/21/2021   CL 106 07/21/2021   CO2 29 07/21/2021   Lab Results  Component Value Date   ALT 7 07/21/2021   AST 22 07/21/2021   ALKPHOS 81 07/21/2021   BILITOT 0.5 07/21/2021      RADIOGRAPHY: MR BRAIN W WO CONTRAST  Result Date: 07/29/2021 CLINICAL DATA:  Brain/CNS neoplasm, assess treatment response. Metastatic lung cancer. Prior SRS to a left temporal lesion. EXAM: MRI HEAD WITHOUT AND WITH CONTRAST TECHNIQUE: Multiplanar, multiecho pulse sequences of the brain and surrounding structures were obtained without and with intravenous contrast. CONTRAST:  31mL MULTIHANCE GADOBENATE DIMEGLUMINE 529 MG/ML IV SOLN COMPARISON:  Head MRI 06/12/2021 FINDINGS: Brain: New lesions: None. Larger lesions: 1. 4 mm enhancing lesion in the cerebellar vermis which in retrospect was subtly visible on the prior study but has increased in size (series 11, image 43). No edema. 2. 3 mm enhancing lesion in the medial right occipital lobe, also subtly visible on the prior study but with interval increase in size (series 11, image 70). No edema. 3. Increased size of the previously treated, centrally necrotic left temporal lobe lesion which now measures 3.1 x 2.6 cm with greatly increased, extensive surrounding vasogenic edema. Stable or smaller lesions: None. Other brain findings: Left temporal lobe edema results in sulcal effacement and effacement of the temporal horn of the left lateral  ventricle. There is at most trace rightward midline shift. There is no evidence of hydrocephalus, acute infarct, acute intracranial hemorrhage, or extra-axial fluid collection. There is mild cerebral atrophy. Vascular: Major intracranial vascular flow voids are preserved.  Skull and upper cervical spine: No suspicious marrow lesion. Sinuses/Orbits: Unremarkable orbits. No significant inflammatory disease in the paranasal sinuses. Trace left mastoid effusion. Other: None. IMPRESSION: 1. 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 2. Increased size of 2 previously unreported lesions measuring 3-4 mm in the right occipital lobe and cerebellar vermis consistent with metastases. No edema. Electronically Signed   By: Logan Bores M.D.   On: 07/29/2021 14:34   CT Chest W Contrast  Result Date: 07/17/2021 CLINICAL DATA:  Follow-up metastatic left lung adenocarcinoma. Assess treatment response. * Tracking Code: BO * EXAM: CT CHEST, ABDOMEN, AND PELVIS WITH CONTRAST TECHNIQUE: Multidetector CT imaging of the chest, abdomen and pelvis was performed following the standard protocol during bolus administration of intravenous contrast. RADIATION DOSE REDUCTION: This exam was performed according to the departmental dose-optimization program which includes automated exposure control, adjustment of the mA and/or kV according to patient size and/or use of iterative reconstruction technique. CONTRAST:  152mL OMNIPAQUE IOHEXOL 300 MG/ML  SOLN COMPARISON:  04/23/2021 FINDINGS: CT CHEST FINDINGS Cardiovascular: No acute findings. Aortic and coronary atherosclerotic calcification incidentally noted. Mediastinum/Lymph Nodes: No masses or pathologically enlarged lymph nodes identified. Lungs/Pleura: Chronic interstitial fibrosis again demonstrated in the subpleural lung zones. Pulmonary nodule in the anteromedial left  upper lobe has decreased in size, currently measuring 6 x 6 mm on image 29/2, compared to 10 x 9 mm previously. Several patchy areas of airspace opacity in the peripheral left upper lobe have increased since previous study, likely due to infectious or inflammatory etiology. No new or enlarging solid pulmonary nodules identified. No evidence pleural effusion. Musculoskeletal:  No suspicious bone lesions identified. CT ABDOMEN AND PELVIS FINDINGS Hepatobiliary: No masses identified. Gallbladder is unremarkable. No evidence of biliary ductal dilatation. Pancreas:  No mass or inflammatory changes. Spleen:  Within normal limits in size and appearance. Adrenals/Urinary tract:  No masses or hydronephrosis. Stomach/Bowel: No evidence of obstruction, inflammatory process, or abnormal fluid collections. Diffuse colonic diverticulosis again noted, however there is no evidence of diverticulitis. Normal appendix visualized. Vascular/Lymphatic: No pathologically enlarged lymph nodes identified. No acute vascular findings. Aortic atherosclerotic calcification incidentally noted. Reproductive:  No mass or other significant abnormality identified. Other:  None. Musculoskeletal: No suspicious bone lesions identified. 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 IMPRESSION: Decreased size of left upper lobe pulmonary nodule. Increased patchy areas of airspace opacity in peripheral left upper lobe, most likely due to inflammatory or infectious etiology. 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 No evidence of abdominal or pelvic metastatic disease. Colonic diverticulosis, without radiographic evidence of diverticulitis. Pulmonary interstitial fibrosis. Aortic Atherosclerosis (ICD10-I70.0). Electronically Signed   By: Marlaine Hind M.D.   On: 07/17/2021 13:30   CT Abdomen Pelvis W Contrast  Result Date: 07/17/2021 CLINICAL DATA:  Follow-up  metastatic left lung adenocarcinoma. Assess treatment response. * Tracking Code: BO * EXAM: CT CHEST, ABDOMEN, AND PELVIS WITH CONTRAST TECHNIQUE: Multidetector CT imaging of the chest, abdomen and pelvis was performed following the standard protocol during bolus administration of intravenous contrast. RADIATION DOSE REDUCTION: This exam was performed according to the departmental dose-optimization program which includes automated exposure control, adjustment of the mA and/or kV according to patient size and/or use of iterative reconstruction technique. CONTRAST:  180mL OMNIPAQUE IOHEXOL 300 MG/ML  SOLN COMPARISON:  04/23/2021 FINDINGS: CT CHEST FINDINGS Cardiovascular: No acute findings. Aortic and coronary  atherosclerotic calcification incidentally noted. Mediastinum/Lymph Nodes: No masses or pathologically enlarged lymph nodes identified. Lungs/Pleura: Chronic interstitial fibrosis again demonstrated in the subpleural lung zones. Pulmonary nodule in the anteromedial left upper lobe has decreased in size, currently measuring 6 x 6 mm on image 29/2, compared to 10 x 9 mm previously. Several patchy areas of airspace opacity in the peripheral left upper lobe have increased since previous study, likely due to infectious or inflammatory etiology. No new or enlarging solid pulmonary nodules identified. No evidence pleural effusion. Musculoskeletal:  No suspicious bone lesions identified. CT ABDOMEN AND PELVIS FINDINGS Hepatobiliary: No masses identified. Gallbladder is unremarkable. No evidence of biliary ductal dilatation. Pancreas:  No mass or inflammatory changes. Spleen:  Within normal limits in size and appearance. Adrenals/Urinary tract:  No masses or hydronephrosis. Stomach/Bowel: No evidence of obstruction, inflammatory process, or abnormal fluid collections. Diffuse colonic diverticulosis again noted, however there is no evidence of diverticulitis. Normal appendix visualized. Vascular/Lymphatic: No  pathologically enlarged lymph nodes identified. No acute vascular findings. Aortic atherosclerotic calcification incidentally noted. Reproductive:  No mass or other significant abnormality identified. Other:  None. Musculoskeletal: No suspicious bone lesions identified. 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 IMPRESSION: Decreased size of left upper lobe pulmonary nodule. Increased patchy areas of airspace opacity in peripheral left upper lobe, most likely due to inflammatory or infectious etiology. 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 No evidence of abdominal or pelvic metastatic disease. Colonic diverticulosis, without radiographic evidence of diverticulitis. Pulmonary interstitial fibrosis. Aortic Atherosclerosis (ICD10-I70.0). Electronically Signed   By: Marlaine Hind M.D.   On: 07/17/2021 13:30       IMPRESSION/PLAN: 1. Progressive Metastatic Stage IV, cT1bN1M1b, NSCLC, adenocarcinoma of the Left Upper Lobe. We reviewed the paitent's recent course. He tolerated SBRT to the LUL with Dr. Rhunette Croft. He has also seen Dr. 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 His symptoms have somewhat improved.  We will continue to follow this closely. We also reviewed his most recent imaging that shows two new lesion in the brain that are subcentimeter and discussed the rationale for SRS treatment in single fraction to these sites.  We discussed the risks, benefits, short, and long term effects of radiotherapy, as well as the curative intent, and the patient is interested in proceeding. Written consent is obtained and placed in the chart, a copy was provided to the patient. He will simulate today. He will otherwise continue systemic therapay with Dr. Julien Nordmann. 2. 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 We appreciate Dr. Renda Rolls input and recommendations  for managing his symptoms, and hopefully will resolve with ongoing steroids.    In a visit lasting 45 minutes, greater than 50% of the time was spent face to face discussing the patient's condition, in preparation for the discussion, and coordinating the patient's care.      Carola Rhine, [facility]   **Disclaimer: This note was dictated with voice recognition software. Similar sounding words can inadvertently be transcribed and this note may contain transcription errors which may not have been corrected upon publication of note.**

## 2021-08-11 ENCOUNTER — Other Ambulatory Visit: Payer: Medicare HMO

## 2021-08-11 ENCOUNTER — Encounter: Payer: Self-pay | Admitting: Internal Medicine

## 2021-08-11 ENCOUNTER — Other Ambulatory Visit: Payer: Self-pay | Admitting: Internal Medicine

## 2021-08-11 ENCOUNTER — Inpatient Hospital Stay: Payer: Medicare HMO

## 2021-08-11 ENCOUNTER — Inpatient Hospital Stay: Payer: Medicare HMO | Admitting: Internal Medicine

## 2021-08-11 VITALS — BP 137/74 | HR 66 | Temp 98.0°F | Resp 18

## 2021-08-11 VITALS — BP 120/76 | HR 66 | Temp 97.8°F | Resp 18 | Ht 73.0 in | Wt 179.7 lb

## 2021-08-11 DIAGNOSIS — C3492 Malignant neoplasm of unspecified part of left bronchus or lung: Secondary | ICD-10-CM

## 2021-08-11 DIAGNOSIS — Z5111 Encounter for antineoplastic chemotherapy: Secondary | ICD-10-CM | POA: Diagnosis not present

## 2021-08-11 DIAGNOSIS — Z5112 Encounter for antineoplastic immunotherapy: Secondary | ICD-10-CM

## 2021-08-11 LAB — CBC WITH DIFFERENTIAL (CANCER CENTER ONLY)
Abs Immature Granulocytes: 0.05 10*3/uL (ref 0.00–0.07)
Basophils Absolute: 0 10*3/uL (ref 0.0–0.1)
Basophils Relative: 0 %
Eosinophils Absolute: 0.3 10*3/uL (ref 0.0–0.5)
Eosinophils Relative: 3 %
HCT: 31.5 % — ABNORMAL LOW (ref 39.0–52.0)
Hemoglobin: 10.8 g/dL — ABNORMAL LOW (ref 13.0–17.0)
Immature Granulocytes: 1 %
Lymphocytes Relative: 15 %
Lymphs Abs: 1.3 10*3/uL (ref 0.7–4.0)
MCH: 33.9 pg (ref 26.0–34.0)
MCHC: 34.3 g/dL (ref 30.0–36.0)
MCV: 98.7 fL (ref 80.0–100.0)
Monocytes Absolute: 0.9 10*3/uL (ref 0.1–1.0)
Monocytes Relative: 10 %
Neutro Abs: 6.3 10*3/uL (ref 1.7–7.7)
Neutrophils Relative %: 71 %
Platelet Count: 167 10*3/uL (ref 150–400)
RBC: 3.19 MIL/uL — ABNORMAL LOW (ref 4.22–5.81)
RDW: 13.1 % (ref 11.5–15.5)
WBC Count: 8.9 10*3/uL (ref 4.0–10.5)
nRBC: 0 % (ref 0.0–0.2)

## 2021-08-11 LAB — CMP (CANCER CENTER ONLY)
ALT: 9 U/L (ref 0–44)
AST: 16 U/L (ref 15–41)
Albumin: 3.7 g/dL (ref 3.5–5.0)
Alkaline Phosphatase: 75 U/L (ref 38–126)
Anion gap: 3 — ABNORMAL LOW (ref 5–15)
BUN: 16 mg/dL (ref 8–23)
CO2: 30 mmol/L (ref 22–32)
Calcium: 9.1 mg/dL (ref 8.9–10.3)
Chloride: 106 mmol/L (ref 98–111)
Creatinine: 0.95 mg/dL (ref 0.61–1.24)
GFR, Estimated: >60 mL/min (ref >60)
Glucose, Bld: 96 mg/dL (ref 70–99)
Potassium: 4.3 mmol/L (ref 3.5–5.1)
Sodium: 139 mmol/L (ref 135–145)
Total Bilirubin: 0.5 mg/dL (ref 0.3–1.2)
Total Protein: 6.3 g/dL — ABNORMAL LOW (ref 6.5–8.1)

## 2021-08-11 LAB — TSH: TSH: 2.324 u[IU]/mL (ref 0.350–4.500)

## 2021-08-11 MED ORDER — SODIUM CHLORIDE 0.9 % IV SOLN
500.0000 mg/m2 | Freq: Once | INTRAVENOUS | Status: AC
  Administered 2021-08-11: 1000 mg via INTRAVENOUS
  Filled 2021-08-11: qty 40

## 2021-08-11 MED ORDER — SODIUM CHLORIDE 0.9 % IV SOLN: Freq: Once | INTRAVENOUS | Status: AC

## 2021-08-11 MED ORDER — SODIUM CHLORIDE 0.9 % IV SOLN
200.0000 mg | Freq: Once | INTRAVENOUS | Status: AC
  Administered 2021-08-11: 200 mg via INTRAVENOUS
  Filled 2021-08-11: qty 200

## 2021-08-11 MED ORDER — PROCHLORPERAZINE MALEATE 10 MG PO TABS
10.0000 mg | ORAL_TABLET | Freq: Once | ORAL | Status: AC
  Administered 2021-08-11: 10 mg via ORAL
  Filled 2021-08-11: qty 1

## 2021-08-11 MED ORDER — CYANOCOBALAMIN 1000 MCG/ML IJ SOLN
1000.0000 ug | Freq: Once | INTRAMUSCULAR | Status: AC
  Administered 2021-08-11: 1000 ug via INTRAMUSCULAR
  Filled 2021-08-11: qty 1

## 2021-08-11 MED ORDER — SODIUM CHLORIDE 0.9% FLUSH
10.0000 mL | INTRAVENOUS | Status: DC | PRN
  Administered 2021-08-11: 10 mL

## 2021-08-11 MED ORDER — HEPARIN SOD (PORK) LOCK FLUSH 100 UNIT/ML IV SOLN
500.0000 [IU] | Freq: Once | INTRAVENOUS | Status: AC | PRN
  Administered 2021-08-11: 500 [IU]

## 2021-08-11 NOTE — Patient Instructions (Signed)
[facility]COL[facility]   Discharge Instructions: Thank you for choosing [facility] to provide your oncology and hematology care.   If you have a lab appointment with the [facility], please go directly to the [facility] and check in at the registration area.   Wear comfortable clothing and clothing appropriate for easy access to any Portacath or PICC line.   We strive to give you quality time with your provider. You may need to reschedule your appointment if you arrive late (15 or more minutes).  Arriving late affects you and other patients whose appointments are after yours.  Also, if you miss three or more appointments without notifying the office, you may be dismissed from the clinic at the provider's discretion.      For prescription refill requests, have your pharmacy contact our office and allow 72 hours for refills to be completed.    Today you received the following chemotherapy and/or immunotherapy agents: Pembrolizumab and Pemetrexed      To help prevent nausea and vomiting after your treatment, we encourage you to take your nausea medication as directed.  BELOW ARE SYMPTOMS THAT SHOULD BE REPORTED IMMEDIATELY: *FEVER GREATER THAN 100.4 F (38 C) OR HIGHER *CHILLS OR SWEATING *NAUSEA AND VOMITING THAT IS NOT CONTROLLED WITH YOUR NAUSEA MEDICATION *UNUSUAL SHORTNESS OF BREATH *UNUSUAL BRUISING OR BLEEDING *URINARY PROBLEMS (pain or burning when urinating, or frequent urination) *BOWEL PROBLEMS (unusual diarrhea, constipation, pain near the anus) TENDERNESS IN MOUTH AND THROAT WITH OR WITHOUT PRESENCE OF ULCERS (sore throat, sores in mouth, or a toothache) UNUSUAL RASH, SWELLING OR PAIN  UNUSUAL VAGINAL DISCHARGE OR ITCHING   Items with * indicate a potential emergency and should be followed up as soon as possible or go to the Emergency Department if any problems should occur.  Please show the CHEMOTHERAPY ALERT CARD or IMMUNOTHERAPY ALERT  CARD at check-in to the Emergency Department and triage nurse.  Should you have questions after your visit or need to cancel or reschedule your appointment, please contact [facility]  Dept: [phone]  and follow the prompts.  Office hours are 8:00 a.m. to 4:30 p.m. Monday - Friday. Please note that voicemails left after 4:00 p.m. may not be returned until the following business day.  We are closed weekends and major holidays. You have access to a nurse at all times for urgent questions. Please call the main number to the clinic Dept: [phone] and follow the prompts.   For any non-urgent questions, you may also contact your provider using MyChart. We now offer e-Visits for anyone 80 and older to request care online for non-urgent symptoms. For details visit [email or web address].   Also download the MyChart app! Go to the app store, search "MyChart", open the app, select [facility], and log in with your MyChart username and password.  Masks are optional in the cancer centers. If you would like for your care team to wear a mask while they are taking care of you, please let them know. For doctor visits, patients may have with them one support person who is at least 80 years old. At this time, visitors are not allowed in the infusion area.

## 2021-08-11 NOTE — Progress Notes (Signed)
[place] [phone]   [phone]  OFFICE PROGRESS NOTE  [name], MD [address] [place] [address]  DIAGNOSIS: Stage IV (T1b, N2, M1a) non-small cell lung cancer, adenocarcinoma diagnosed in July 2022 and presented with left upper lobe nodule in addition to AP window lymphadenopathy and left-sided malignant pleural effusion as well as pleural metastatic disease.  The patient also has solitary left temporal brain metastasis.  Biomarker Findings Microsatellite status - MS-Stable Tumor Mutational Burden - 4 Muts/Mb Genomic Findings For a complete list of the genes assayed, please refer to the Appendix. [id number] V253G PTEN splice site [id number]>K - subclonal? DOT1L S911L - subclonal? RAD21 S271* RB1 loss exons 3-23 TP53 N372f*34 8 Disease relevant genes with no reportable alterations: ALK, BRAF, EGFR, ERBB2, KRAS, MET, RET, ROS1  PRIOR THERAPY: [facility]  CURRENT THERAPY: Systemic chemotherapy with carboplatin for AUC of 5, Alimta 500 Mg/M2 and Keytruda 200 Mg IV every 3 weeks.  First dose August 13, 2020.  Status post 16 cycles.  Starting from cycle #5 he is on maintenance treatment with Alimta and Keytruda every 3 weeks.  INTERVAL HISTORY: JTimotheus Salm83y.o. male returns to the clinic today for follow-up visit accompanied by his wife.  The patient is feeling fine today with no concerning complaints except for mild fatigue.  He has mild cough and shortness of breath with exertion.  He denied having any chest pain or hemoptysis.  He has no nausea, vomiting, diarrhea or constipation.  He has no current headache or visual changes.  He has been tolerating his treatment with maintenance Alimta and Keytruda fairly well.  He celebrated his 81th birthday few days ago.  The patient is here today for evaluation before starting cycle  #17.   MEDICAL HISTORY: Past Medical History:  Diagnosis [place]   [place]    [facility] to [facility] ([facility]     ALLERGIES:  has no active allergies.  MEDICATIONS:  Current Outpatient Medications  Medication Sig Dispense Refill   albuterol (VENTOLIN HFA) 108 (90 Base) MCG/ACT inhaler Inhale 2 puffs into the lungs every 4 (four) hours as needed. Every 4-6 hours     aspirin 81 MG EC tablet Take by mouth.     Bacillus Coagulans-Inulin (PROBIOTIC) 1-250 BILLION-MG CAPS Take 1 tablet by mouth daily.     bisacodyl (DULCOLAX) 5 MG EC tablet Take 5 mg by mouth 3 (three) times daily as needed for moderate constipation.     Cholecalciferol (D3 PO) Take 1 tablet by mouth daily.     coal tar-salicylic acid 2 % shampoo Apply topically daily as needed for itching.     dexamethasone (DECADRON) 2 MG tablet Take 2 tablets (4 mg total) by mouth daily for 7 days, THEN 1 tablet (2 mg total) daily. 74 tablet 0   DULoxetine (CYMBALTA) 30 MG capsule Take 30 mg by mouth daily.     fexofenadine (ALLEGRA) 180 MG tablet Take 180 mg by mouth daily.     fluocinonide cream (LIDEX) 07.42% Apply 1 application topically 2 (two) times daily.     folic acid (FOLVITE) 1 MG tablet TAKE 1 TABLET(1 MG) BY MOUTH DAILY 30 tablet 4   hydrocortisone cream 1 % Apply 1 application topically 2 (two) times daily. 453.6 g 0   ketoconazole (NIZORAL) 2 % cream Apply 1 application topically daily.  Krill Oil (OMEGA-3) 500 MG CAPS Take by mouth.     levothyroxine (SYNTHROID) 25 MCG tablet Take 1 tablet (25 mcg total) by mouth daily before breakfast. 30 tablet 2   lidocaine-prilocaine (EMLA) cream Apply 1 application topically as needed. 30 g 1   Magnesium 250 MG TABS Take by mouth.     Niacin (VITAMIN B-3 PO) Take 1 tablet by mouth daily.     omeprazole (PRILOSEC) 40 MG capsule Take 40 mg by mouth daily.     ondansetron (ZOFRAN-ODT) 4 MG disintegrating tablet Take 1 tablet (4 mg total) by mouth every 8 (eight) hours  as needed. (Patient not taking: Reported on 08/11/2021) 20 tablet 0   prochlorperazine (COMPAZINE) 10 MG tablet TAKE 1 TABLET(10 MG) BY MOUTH EVERY 6 HOURS AS NEEDED FOR NAUSEA OR VOMITING (Patient not taking: Reported on 08/11/2021) 30 tablet 0   triamcinolone cream (KENALOG) 0.1 % Apply 1 application topically daily as needed. 453 g 6   No current facility-administered medications for this visit.    SURGICAL HISTORY:  Past Surgical History:  Procedure Laterality Date   IR IMAGING GUIDED PORT INSERTION  11/24/2020   KNEE SURGERY     orthoscopic     TONSILLECTOMY AND ADENOIDECTOMY      REVIEW OF SYSTEMS:  A comprehensive review of systems was negative except for: Constitutional: positive for fatigue Respiratory: positive for cough and dyspnea on exertion   PHYSICAL EXAMINATION: General appearance: alert, cooperative, fatigued, and no distress Head: Normocephalic, without obvious abnormality, atraumatic Neck: no adenopathy, no JVD, supple, symmetrical, trachea midline, and thyroid not enlarged, symmetric, no tenderness/mass/nodules Lymph nodes: Cervical, supraclavicular, and axillary nodes normal. Resp: clear to auscultation bilaterally Back: symmetric, no curvature. ROM normal. No CVA tenderness. Cardio: regular rate and rhythm, S1, S2 normal, no murmur, click, rub or gallop GI: soft, non-tender; bowel sounds normal; no masses,  no organomegaly Extremities: extremities normal, atraumatic, no cyanosis or edema  ECOG PERFORMANCE STATUS: 1 - Symptomatic but completely ambulatory  Blood pressure 120/76, pulse 66, temperature 97.8 F (36.6 C), temperature source Oral, resp. rate 18, height '6\' 1"'  (1.854 m), weight 179 lb 11.2 oz (81.5 kg), SpO2 98 %.  LABORATORY DATA: Lab Results  Component Value Date   WBC 8.9 08/11/2021   HGB 10.8 (L) 08/11/2021   HCT 31.5 (L) 08/11/2021   MCV 98.7 08/11/2021   PLT 167 08/11/2021      Chemistry      Component Value Date/Time   NA 139 07/21/2021  1112   K 4.3 07/21/2021 1112   CL 106 07/21/2021 1112   CO2 29 07/21/2021 1112   BUN 12 07/21/2021 1112   CREATININE 0.98 07/21/2021 1112      Component Value Date/Time   CALCIUM 9.4 07/21/2021 1112   ALKPHOS 81 07/21/2021 1112   AST 22 07/21/2021 1112   ALT 7 07/21/2021 1112   BILITOT 0.5 07/21/2021 1112       RADIOGRAPHIC STUDIES: MR BRAIN W WO CONTRAST  Result Date: 07/29/2021 CLINICAL DATA:  Brain/CNS neoplasm, assess treatment response. Metastatic lung cancer. Prior SRS to a left temporal lesion. EXAM: MRI HEAD WITHOUT AND WITH CONTRAST TECHNIQUE: Multiplanar, multiecho pulse sequences of the brain and surrounding structures were obtained without and with intravenous contrast. CONTRAST:  40m MULTIHANCE GADOBENATE DIMEGLUMINE 529 MG/ML IV SOLN COMPARISON:  Head MRI 06/12/2021 FINDINGS: Brain: New lesions: None. Larger lesions: 1. 4 mm enhancing lesion in the cerebellar vermis which in retrospect was subtly visible on the prior study  but has increased in size (series 11, image 43). No edema. 2. 3 mm enhancing lesion in the medial right occipital lobe, also subtly visible on the prior study but with interval increase in size (series 11, image 70). No edema. 3. Increased size of the previously treated, centrally necrotic left temporal lobe lesion which now measures 3.1 x 2.6 cm with greatly increased, extensive surrounding vasogenic edema. Stable or smaller lesions: None. Other brain findings: Left temporal lobe edema results in sulcal effacement and effacement of the temporal horn of the left lateral ventricle. There is at most trace rightward midline shift. There is no evidence of hydrocephalus, acute infarct, acute intracranial hemorrhage, or extra-axial fluid collection. There is mild cerebral atrophy. Vascular: Major intracranial vascular flow voids are preserved. Skull and upper cervical spine: No suspicious marrow lesion. Sinuses/Orbits: Unremarkable orbits. No significant inflammatory  disease in the paranasal sinuses. Trace left mastoid effusion. Other: None. IMPRESSION: 1. 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 2. Increased size of 2 previously unreported lesions measuring 3-4 mm in the right occipital lobe and cerebellar vermis consistent with metastases. No edema. Electronically Signed   By: Logan Bores M.D.   On: 07/29/2021 14:34   CT Chest W Contrast  Result Date: 07/17/2021 CLINICAL DATA:  Follow-up metastatic left lung adenocarcinoma. Assess treatment response. * Tracking Code: BO * EXAM: CT CHEST, ABDOMEN, AND PELVIS WITH CONTRAST TECHNIQUE: Multidetector CT imaging of the chest, abdomen and pelvis was performed following the standard protocol during bolus administration of intravenous contrast. RADIATION DOSE REDUCTION: This exam was performed according to the departmental dose-optimization program which includes automated exposure control, adjustment of the mA and/or kV according to patient size and/or use of iterative reconstruction technique. CONTRAST:  146m OMNIPAQUE IOHEXOL 300 MG/ML  SOLN COMPARISON:  04/23/2021 FINDINGS: CT CHEST FINDINGS Cardiovascular: No acute findings. Aortic and coronary atherosclerotic calcification incidentally noted. Mediastinum/Lymph Nodes: No masses or pathologically enlarged lymph nodes identified. Lungs/Pleura: Chronic interstitial fibrosis again demonstrated in the subpleural lung zones. Pulmonary nodule in the anteromedial left upper lobe has decreased in size, currently measuring 6 x 6 mm on image 29/2, compared to 10 x 9 mm previously. Several patchy areas of airspace opacity in the peripheral left upper lobe have increased since previous study, likely due to infectious or inflammatory etiology. No new or enlarging solid pulmonary nodules identified. No evidence pleural effusion. Musculoskeletal:  No suspicious bone  lesions identified. CT ABDOMEN AND PELVIS FINDINGS Hepatobiliary: No masses identified. Gallbladder is unremarkable. No evidence of biliary ductal dilatation. Pancreas:  No mass or inflammatory changes. Spleen:  Within normal limits in size and appearance. Adrenals/Urinary tract:  No masses or hydronephrosis. Stomach/Bowel: No evidence of obstruction, inflammatory process, or abnormal fluid collections. Diffuse colonic diverticulosis again noted, however there is no evidence of diverticulitis. Normal appendix visualized. Vascular/Lymphatic: No pathologically enlarged lymph nodes identified. No acute vascular findings. Aortic atherosclerotic calcification incidentally noted. Reproductive:  No mass or other significant abnormality identified. Other:  None. Musculoskeletal: No suspicious bone lesions identified. 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 IMPRESSION: Decreased size of left upper lobe pulmonary nodule. Increased patchy areas of airspace opacity in peripheral left upper lobe, most likely due to inflammatory or infectious etiology. 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 No evidence of abdominal or pelvic metastatic disease. Colonic diverticulosis, without radiographic evidence of diverticulitis. Pulmonary interstitial fibrosis.  Aortic Atherosclerosis (ICD10-I70.0). Electronically Signed   By: Marlaine Hind M.D.   On: 07/17/2021 13:30   CT Abdomen Pelvis W Contrast  Result Date: 07/17/2021 CLINICAL DATA:  Follow-up metastatic left lung adenocarcinoma. Assess treatment response. * Tracking Code: BO * EXAM: CT CHEST, ABDOMEN, AND PELVIS WITH CONTRAST TECHNIQUE: Multidetector CT imaging of the chest, abdomen and pelvis was performed following the standard protocol during bolus administration of intravenous contrast. RADIATION DOSE REDUCTION: This exam was performed according to the departmental dose-optimization  program which includes automated exposure control, adjustment of the mA and/or kV according to patient size and/or use of iterative reconstruction technique. CONTRAST:  137m OMNIPAQUE IOHEXOL 300 MG/ML  SOLN COMPARISON:  04/23/2021 FINDINGS: CT CHEST FINDINGS Cardiovascular: No acute findings. Aortic and coronary atherosclerotic calcification incidentally noted. Mediastinum/Lymph Nodes: No masses or pathologically enlarged lymph nodes identified. Lungs/Pleura: Chronic interstitial fibrosis again demonstrated in the subpleural lung zones. Pulmonary nodule in the anteromedial left upper lobe has decreased in size, currently measuring 6 x 6 mm on image 29/2, compared to 10 x 9 mm previously. Several patchy areas of airspace opacity in the peripheral left upper lobe have increased since previous study, likely due to infectious or inflammatory etiology. No new or enlarging solid pulmonary nodules identified. No evidence pleural effusion. Musculoskeletal:  No suspicious bone lesions identified. CT ABDOMEN AND PELVIS FINDINGS Hepatobiliary: No masses identified. Gallbladder is unremarkable. No evidence of biliary ductal dilatation. Pancreas:  No mass or inflammatory changes. Spleen:  Within normal limits in size and appearance. Adrenals/Urinary tract:  No masses or hydronephrosis. Stomach/Bowel: No evidence of obstruction, inflammatory process, or abnormal fluid collections. Diffuse colonic diverticulosis again noted, however there is no evidence of diverticulitis. Normal appendix visualized. Vascular/Lymphatic: No pathologically enlarged lymph nodes identified. No acute vascular findings. Aortic atherosclerotic calcification incidentally noted. Reproductive:  No mass or other significant abnormality identified. Other:  None. Musculoskeletal: No suspicious bone lesions identified. 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 IMPRESSION: Decreased size of left upper  lobe pulmonary nodule. Increased patchy areas of airspace opacity in peripheral left upper lobe, most likely due to inflammatory or infectious etiology. 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 No evidence of abdominal or pelvic metastatic disease. Colonic diverticulosis, without radiographic evidence of diverticulitis. Pulmonary interstitial fibrosis. Aortic Atherosclerosis (ICD10-I70.0). Electronically Signed   By: JMarlaine HindM.D.   On: 07/17/2021 13:30    ASSESSMENT AND PLAN: This is a very pleasant 81years old white male recently diagnosed with a stage IV (T1b, N2, M1a) non-small cell lung cancer, adenocarcinoma presented with left upper lobe lung nodule in addition to mediastinal lymphadenopathy and pleural-based metastasis as well as malignant left pleural effusion diagnosed in July 2022. His molecular studies by foundation 1 showed no actionable mutations. The patient is currently undergoing systemic chemotherapy with carboplatin for AUC of 5, Alimta 500 Mg/M2 and Keytruda 200 Mg IV every 3 weeks.  He is status post 16 cycles.  Starting from cycle #5 the patient will be on treatment with Alimta and Keytruda every 3 weeks. He [place] palliative [place] enlarging [place] The patient has been tolerating his treatment with maintenance Alimta and Keytruda fairly well. I recommended for him to proceed with cycle #17 today as planned. I will see him back for follow-up visit in 3 weeks for evaluation before the next cycle of his treatment. The patient was advised to  call immediately if he has any concerning symptoms in the interval. The patient voices understanding of current disease status and treatment options and is in agreement with the current care plan.  All questions were answered. The patient knows to call the clinic with any problems, questions or concerns. We can certainly see the  patient much sooner if necessary.  Disclaimer: This note was dictated with voice recognition software. Similar sounding words can inadvertently be transcribed and may not be corrected upon review.

## 2021-08-12 ENCOUNTER — Other Ambulatory Visit: Payer: Self-pay

## 2021-08-13 ENCOUNTER — Other Ambulatory Visit: Payer: Self-pay

## 2021-08-13 DIAGNOSIS — C7931 Secondary malignant neoplasm of brain: Secondary | ICD-10-CM | POA: Diagnosis not present

## 2021-08-14 ENCOUNTER — Encounter: Payer: Self-pay | Admitting: Radiation Oncology

## 2021-08-14 ENCOUNTER — Ambulatory Visit
Admission: RE | Admit: 2021-08-14 | Discharge: 2021-08-14 | Disposition: A | Discharge status: Home / Self Care | Payer: Medicare HMO | Source: Ambulatory Visit | Attending: Radiation Oncology | Admitting: Radiation Oncology

## 2021-08-14 ENCOUNTER — Other Ambulatory Visit: Payer: Self-pay

## 2021-08-14 DIAGNOSIS — C7931 Secondary malignant neoplasm of brain: Secondary | ICD-10-CM | POA: Diagnosis not present

## 2021-08-14 LAB — RAD ONC ARIA SESSION SUMMARY
Course Elapsed Days: 0
Plan Fractions Treated to Date: 1
Plan Prescribed Dose Per Fraction: 20 Gy
Plan Total Fractions Prescribed: 1
Plan Total Prescribed Dose: 20 Gy
Reference Point Dosage Given to Date: 20 Gy
Reference Point Session Dosage Given: 20 Gy
Session Number: 1

## 2021-08-14 NOTE — Op Note (Signed)
  Name: Peter Brown  MRN: [id number]  Date: 08/14/2021   DOB: 1940-10-24  Stereotactic Radiosurgery Operative Note  PRE-OPERATIVE DIAGNOSIS:  Metastatic brain tumors  POST-OPERATIVE DIAGNOSIS:  Same  PROCEDURE:  Stereotactic Radiosurgery  SURGEON:  Judith Part, MD  NARRATIVE: The patient underwent a radiation treatment planning session in the radiation oncology simulation suite under the care of the radiation oncology physician and physicist.  I participated closely in the radiation treatment planning afterwards. The patient underwent planning CT which was fused to 3T high resolution MRI with 1 mm axial slices.  These images were fused on the planning system.  We contoured the gross target volumes and subsequently expanded this to yield the Planning Target Volume. I actively participated in the planning process.  I helped to define and review the target contours and also the contours of the optic pathway, eyes, brainstem and selected nearby organs at risk.  All the dose constraints for critical structures were reviewed and compared to AAPM Task Group 101.  The prescription dose conformity was reviewed.  I approved the plan electronically.    Accordingly, Peter Brown was brought to the TrueBeam stereotactic radiation treatment linac and placed in the custom immobilization mask.  The patient was aligned according to the IR fiducial markers with BrainLab Exactrac, then orthogonal x-rays were used in ExacTrac with the 6DOF robotic table and the shifts were made to align the patient  Peter Brown received stereotactic radiosurgery uneventfully.    Lesions treated:  3   Complex lesions treated:  0 (>3.5 cm, <18mm of optic path, or within the brainstem)   The detailed description of the procedure is recorded in the radiation oncology procedure note.  I was present for the duration of the procedure.  DISPOSITION:  Following delivery, the patient was transported to nursing in stable condition and  monitored for possible acute effects to be discharged to home in stable condition with follow-up in one month.  Judith Part, MD 08/14/2021 9:16 PM

## 2021-08-14 NOTE — Progress Notes (Signed)
Peter Brown rested with [name] for 30 minutes following his SRS treatment.  Patient denies headache, dizziness, nausea, diplopia or ringing in the ears. Denies fatigue. Patient without complaints. Understands to avoid strenuous activity for the next 24 hours and call [phone] with needs.   BP 129/74   Pulse 89   Temp 98.8 F (37.1 C) (Oral)   Resp 18   SpO2 97%    Peter Brown M. Leonie Green, BSN

## 2021-08-18 ENCOUNTER — Encounter: Payer: Self-pay | Admitting: Internal Medicine

## 2021-08-18 NOTE — Progress Notes (Signed)
                                                                                                                                                             Patient Name: Peter Brown MRN: [id number] DOB: 1940/02/20 Referring Physician: [name] (Profile Not Attached) Date of Service: 08/14/2021 [facility], [place]                                                        End Of Treatment Note  Diagnoses: C34.12-Malignant neoplasm of upper lobe, left bronchus or lung  Cancer Staging:    Progressive Metastatic Stage IV, cT1bN1M1b, NSCLC, adenocarcinoma of the Left Upper Lobe  Intent: Palliative  Radiation Treatment Dates: 08/14/2021 through 08/14/2021 Site: Brain Technique Total Dose (Gy) Dose per Fx (Gy) Completed Fx Beam Energies  [id number]_2_[id number]_68mm  PTV_3_[id number]_41mm IMRT IMRT 20/20 20/20 20 20  1/1 1/1 [id number]   Narrative: The patient tolerated radiation therapy relatively well.    Plan: The patient will receive a call in about one month from the radiation oncology department. He will continue follow up with Dr. Julien Nordmann as well as Dr. Mickeal Skinner.  ________________________________________________    Carola Rhine, PAC

## 2021-08-23 ENCOUNTER — Encounter: Payer: Self-pay | Admitting: Internal Medicine

## 2021-08-24 ENCOUNTER — Inpatient Hospital Stay (HOSPITAL_COMMUNITY): Payer: Medicare HMO

## 2021-08-24 ENCOUNTER — Emergency Department (HOSPITAL_COMMUNITY): Payer: Medicare HMO

## 2021-08-24 ENCOUNTER — Other Ambulatory Visit: Payer: Self-pay

## 2021-08-24 ENCOUNTER — Inpatient Hospital Stay (HOSPITAL_COMMUNITY)
Admission: EM | Admit: 2021-08-24 | Discharge: 2021-08-27 | DRG: 194 | Disposition: A | Discharge status: Home / Self Care | Payer: Medicare HMO | Attending: Family Medicine | Admitting: Family Medicine

## 2021-08-24 ENCOUNTER — Encounter (HOSPITAL_COMMUNITY): Payer: Self-pay

## 2021-08-24 DIAGNOSIS — F419 Anxiety disorder, unspecified: Secondary | ICD-10-CM | POA: Diagnosis present

## 2021-08-24 DIAGNOSIS — Z79890 Hormone replacement therapy (postmenopausal): Secondary | ICD-10-CM

## 2021-08-24 DIAGNOSIS — L408 Other psoriasis: Secondary | ICD-10-CM | POA: Diagnosis present

## 2021-08-24 DIAGNOSIS — C3492 Malignant neoplasm of unspecified part of left bronchus or lung: Secondary | ICD-10-CM | POA: Diagnosis present

## 2021-08-24 DIAGNOSIS — Z20822 Contact with and (suspected) exposure to covid-19: Secondary | ICD-10-CM | POA: Diagnosis present

## 2021-08-24 DIAGNOSIS — K449 Diaphragmatic hernia without obstruction or gangrene: Secondary | ICD-10-CM | POA: Diagnosis present

## 2021-08-24 DIAGNOSIS — C7931 Secondary malignant neoplasm of brain: Secondary | ICD-10-CM | POA: Diagnosis present

## 2021-08-24 DIAGNOSIS — J8410 Pulmonary fibrosis, unspecified: Secondary | ICD-10-CM | POA: Diagnosis present

## 2021-08-24 DIAGNOSIS — G9341 Metabolic encephalopathy: Secondary | ICD-10-CM | POA: Diagnosis present

## 2021-08-24 DIAGNOSIS — Z7982 Long term (current) use of aspirin: Secondary | ICD-10-CM | POA: Diagnosis not present

## 2021-08-24 DIAGNOSIS — D696 Thrombocytopenia, unspecified: Secondary | ICD-10-CM | POA: Diagnosis present

## 2021-08-24 DIAGNOSIS — E441 Mild protein-calorie malnutrition: Secondary | ICD-10-CM | POA: Diagnosis present

## 2021-08-24 DIAGNOSIS — E039 Hypothyroidism, unspecified: Secondary | ICD-10-CM | POA: Diagnosis present

## 2021-08-24 DIAGNOSIS — J189 Pneumonia, unspecified organism: Principal | ICD-10-CM

## 2021-08-24 DIAGNOSIS — R0902 Hypoxemia: Secondary | ICD-10-CM | POA: Diagnosis present

## 2021-08-24 DIAGNOSIS — Z8616 Personal history of COVID-19: Secondary | ICD-10-CM

## 2021-08-24 DIAGNOSIS — D649 Anemia, unspecified: Secondary | ICD-10-CM | POA: Diagnosis present

## 2021-08-24 DIAGNOSIS — E782 Mixed hyperlipidemia: Secondary | ICD-10-CM | POA: Diagnosis present

## 2021-08-24 DIAGNOSIS — K2270 Barrett's esophagus without dysplasia: Secondary | ICD-10-CM | POA: Diagnosis present

## 2021-08-24 DIAGNOSIS — Z79899 Other long term (current) drug therapy: Secondary | ICD-10-CM

## 2021-08-24 DIAGNOSIS — R410 Disorientation, unspecified: Secondary | ICD-10-CM

## 2021-08-24 DIAGNOSIS — E559 Vitamin D deficiency, unspecified: Secondary | ICD-10-CM | POA: Diagnosis present

## 2021-08-24 LAB — COMPREHENSIVE METABOLIC PANEL
ALT: 20 U/L (ref 0–44)
AST: 29 U/L (ref 15–41)
Albumin: 3.3 g/dL — ABNORMAL LOW (ref 3.5–5.0)
Alkaline Phosphatase: 71 U/L (ref 38–126)
Anion gap: 7 (ref 5–15)
BUN: 19 mg/dL (ref 8–23)
CO2: 23 mmol/L (ref 22–32)
Calcium: 9.5 mg/dL (ref 8.9–10.3)
Chloride: 105 mmol/L (ref 98–111)
Creatinine, Ser: 1.14 mg/dL (ref 0.61–1.24)
GFR, Estimated: >60 mL/min (ref >60)
Glucose, Bld: 158 mg/dL — ABNORMAL HIGH (ref 70–99)
Potassium: 4.4 mmol/L (ref 3.5–5.1)
Sodium: 135 mmol/L (ref 135–145)
Total Bilirubin: <0.1 mg/dL — ABNORMAL LOW (ref 0.3–1.2)
Total Protein: 6.6 g/dL (ref 6.5–8.1)

## 2021-08-24 LAB — CBC WITH DIFFERENTIAL/PLATELET
Abs Immature Granulocytes: 0.06 10*3/uL (ref 0.00–0.07)
Basophils Absolute: 0 10*3/uL (ref 0.0–0.1)
Basophils Relative: 0 %
Eosinophils Absolute: 0.1 10*3/uL (ref 0.0–0.5)
Eosinophils Relative: 2 %
HCT: 30.2 % — ABNORMAL LOW (ref 39.0–52.0)
Hemoglobin: 10.2 g/dL — ABNORMAL LOW (ref 13.0–17.0)
Immature Granulocytes: 1 %
Lymphocytes Relative: 11 %
Lymphs Abs: 0.8 10*3/uL (ref 0.7–4.0)
MCH: 33.3 pg (ref 26.0–34.0)
MCHC: 33.8 g/dL (ref 30.0–36.0)
MCV: 98.7 fL (ref 80.0–100.0)
Monocytes Absolute: 0.8 10*3/uL (ref 0.1–1.0)
Monocytes Relative: 10 %
Neutro Abs: 5.6 10*3/uL (ref 1.7–7.7)
Neutrophils Relative %: 76 %
Platelets: 102 10*3/uL — ABNORMAL LOW (ref 150–400)
RBC: 3.06 MIL/uL — ABNORMAL LOW (ref 4.22–5.81)
RDW: 12.7 % (ref 11.5–15.5)
WBC: 7.4 10*3/uL (ref 4.0–10.5)
nRBC: 0 % (ref 0.0–0.2)

## 2021-08-24 LAB — URINALYSIS, ROUTINE W REFLEX MICROSCOPIC
Bilirubin Urine: NEGATIVE
Glucose, UA: NEGATIVE mg/dL
Hgb urine dipstick: NEGATIVE
Ketones, ur: NEGATIVE mg/dL
Leukocytes,Ua: NEGATIVE
Nitrite: NEGATIVE
Protein, ur: NEGATIVE mg/dL
Specific Gravity, Urine: 1.018 (ref 1.005–1.030)
pH: 7 (ref 5.0–8.0)

## 2021-08-24 LAB — STREP PNEUMONIAE URINARY ANTIGEN: Strep Pneumo Urinary Antigen: NEGATIVE

## 2021-08-24 LAB — SARS CORONAVIRUS 2 BY RT PCR: SARS Coronavirus 2 by RT PCR: NEGATIVE

## 2021-08-24 MED ORDER — ACETAMINOPHEN 325 MG PO TABS
650.0000 mg | ORAL_TABLET | Freq: Four times a day (QID) | ORAL | Status: DC | PRN
  Administered 2021-08-25: 650 mg via ORAL
  Filled 2021-08-24: qty 2

## 2021-08-24 MED ORDER — DEXAMETHASONE 2 MG PO TABS
2.0000 mg | ORAL_TABLET | Freq: Every day | ORAL | Status: DC
  Administered 2021-08-25 – 2021-08-27 (×3): 2 mg via ORAL
  Filled 2021-08-24 (×3): qty 1

## 2021-08-24 MED ORDER — SODIUM CHLORIDE 0.9 % IV SOLN
2.0000 g | INTRAVENOUS | Status: DC
  Administered 2021-08-25 – 2021-08-26 (×2): 2 g via INTRAVENOUS
  Filled 2021-08-24 (×2): qty 20

## 2021-08-24 MED ORDER — SODIUM CHLORIDE 0.9 % IV SOLN
1.0000 g | Freq: Once | INTRAVENOUS | Status: AC
  Administered 2021-08-24: 1 g via INTRAVENOUS
  Filled 2021-08-24: qty 10

## 2021-08-24 MED ORDER — SALICYLIC ACID 2 % EX SHAM
MEDICATED_SHAMPOO | Freq: Every day | CUTANEOUS | Status: DC | PRN
Start: 2021-08-24 — End: 2021-08-24

## 2021-08-24 MED ORDER — SODIUM CHLORIDE 0.9 % IV SOLN
500.0000 mg | INTRAVENOUS | Status: DC
  Administered 2021-08-25 – 2021-08-26 (×2): 500 mg via INTRAVENOUS
  Filled 2021-08-24 (×3): qty 5

## 2021-08-24 MED ORDER — AZITHROMYCIN 500 MG IV SOLR
500.0000 mg | Freq: Once | INTRAVENOUS | Status: AC
  Administered 2021-08-24: 500 mg via INTRAVENOUS
  Filled 2021-08-24: qty 5

## 2021-08-24 MED ORDER — GADOBUTROL 1 MMOL/ML IV SOLN
8.0000 mL | Freq: Once | INTRAVENOUS | Status: AC | PRN
Start: 2021-08-24 — End: 2021-08-24
  Administered 2021-08-24: 8 mL via INTRAVENOUS

## 2021-08-24 MED ORDER — ONDANSETRON HCL 4 MG/2ML IJ SOLN: 4.0000 mg | Freq: Four times a day (QID) | INTRAMUSCULAR | Status: DC | PRN

## 2021-08-24 MED ORDER — PANTOPRAZOLE SODIUM 40 MG PO TBEC
40.0000 mg | DELAYED_RELEASE_TABLET | Freq: Every day | ORAL | Status: DC
  Administered 2021-08-24 – 2021-08-27 (×4): 40 mg via ORAL
  Filled 2021-08-24 (×4): qty 1

## 2021-08-24 MED ORDER — ONDANSETRON HCL 4 MG PO TABS: 4.0000 mg | ORAL_TABLET | Freq: Four times a day (QID) | ORAL | Status: DC | PRN

## 2021-08-24 MED ORDER — ASPIRIN 81 MG PO TBEC
81.0000 mg | DELAYED_RELEASE_TABLET | Freq: Every day | ORAL | Status: DC
  Administered 2021-08-25 – 2021-08-27 (×3): 81 mg via ORAL
  Filled 2021-08-24 (×3): qty 1

## 2021-08-24 MED ORDER — BISACODYL 5 MG PO TBEC: 5.0000 mg | DELAYED_RELEASE_TABLET | Freq: Three times a day (TID) | ORAL | Status: DC | PRN

## 2021-08-24 MED ORDER — LORATADINE 10 MG PO TABS: 10.0000 mg | ORAL_TABLET | Freq: Every day | ORAL | Status: DC | PRN

## 2021-08-24 MED ORDER — ACETAMINOPHEN 500 MG PO TABS
1000.0000 mg | ORAL_TABLET | Freq: Once | ORAL | Status: AC
  Administered 2021-08-24: 1000 mg via ORAL
  Filled 2021-08-24: qty 2

## 2021-08-24 MED ORDER — DULOXETINE HCL 30 MG PO CPEP
30.0000 mg | ORAL_CAPSULE | Freq: Every day | ORAL | Status: DC
  Administered 2021-08-25 – 2021-08-27 (×3): 30 mg via ORAL
  Filled 2021-08-24 (×3): qty 1

## 2021-08-24 MED ORDER — ACETAMINOPHEN 650 MG RE SUPP: 650.0000 mg | Freq: Four times a day (QID) | RECTAL | Status: DC | PRN

## 2021-08-24 MED ORDER — SODIUM CHLORIDE (PF) 0.9 % IJ SOLN
INTRAMUSCULAR | Status: AC
  Filled 2021-08-24: qty 50

## 2021-08-24 MED ORDER — LIDOCAINE-PRILOCAINE 2.5-2.5 % EX CREA: 1.0000 | TOPICAL_CREAM | Freq: Every day | CUTANEOUS | Status: DC | PRN

## 2021-08-24 MED ORDER — IOHEXOL 350 MG/ML SOLN
100.0000 mL | Freq: Once | INTRAVENOUS | Status: AC | PRN
Start: 2021-08-24 — End: 2021-08-24
  Administered 2021-08-24: 48 mL via INTRAVENOUS

## 2021-08-24 MED ORDER — FOLIC ACID 1 MG PO TABS
1.0000 mg | ORAL_TABLET | Freq: Every day | ORAL | Status: DC
  Administered 2021-08-24 – 2021-08-26 (×3): 1 mg via ORAL
  Filled 2021-08-24 (×4): qty 1

## 2021-08-24 MED ORDER — LEVOTHYROXINE SODIUM 25 MCG PO TABS
25.0000 ug | ORAL_TABLET | Freq: Every day | ORAL | Status: DC
  Administered 2021-08-25 – 2021-08-27 (×3): 25 ug via ORAL
  Filled 2021-08-24 (×3): qty 1

## 2021-08-24 MED ORDER — IPRATROPIUM-ALBUTEROL 0.5-2.5 (3) MG/3ML IN SOLN: 3.0000 mL | RESPIRATORY_TRACT | Status: DC | PRN

## 2021-08-24 MED ORDER — MAGNESIUM OXIDE -MG SUPPLEMENT 400 (240 MG) MG PO TABS
400.0000 mg | ORAL_TABLET | Freq: Every evening | ORAL | Status: DC
  Administered 2021-08-25 – 2021-08-26 (×2): 400 mg via ORAL
  Filled 2021-08-24 (×2): qty 1

## 2021-08-24 NOTE — Progress Notes (Deleted)
[facility] OFFICE PROGRESS NOTE  Peter Brown, Peter Brown [id number]  DIAGNOSIS: ***  PRIOR THERAPY:  CURRENT THERAPY:  INTERVAL HISTORY: Peter Brown 81 y.o. 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  MEDICATIONS:  Current Outpatient Medications  Medication Sig Dispense Refill   albuterol (VENTOLIN HFA) 108 (90 Base) MCG/ACT inhaler Inhale 2 puffs into the lungs every 4 (four) hours as needed. Every 4-6 hours     aspirin 81 MG EC tablet Take by mouth.     Bacillus Coagulans-Inulin (PROBIOTIC) 1-250 BILLION-MG CAPS Take 1 tablet by mouth daily.     bisacodyl (DULCOLAX) 5 MG EC tablet Take 5 mg by mouth 3 (three) times daily as needed for moderate constipation.     Cholecalciferol (D3 PO) Take 1 tablet by mouth daily.     coal tar-salicylic acid 2 % shampoo Apply topically daily as needed for itching.     dexamethasone (DECADRON) 2 MG tablet Take 2 tablets (4 mg total) by mouth daily for 7 days, THEN 1 tablet (2 mg total) daily. 74 tablet 0   DULoxetine (CYMBALTA) 30 MG capsule Take 30 mg by mouth daily.     fexofenadine (ALLEGRA) 180 MG tablet Take 180 mg by mouth daily.     fluocinonide cream (LIDEX) 2.50 % Apply 1 application topically 2 (two) times daily.     folic acid (FOLVITE) 1 MG tablet TAKE 1 TABLET(1 MG) BY MOUTH DAILY 30 tablet 4   hydrocortisone cream 1 % Apply 1 application topically 2 (two) times daily. 453.6 g 0   ketoconazole (NIZORAL) 2 % cream Apply 1 application topically daily.     Krill Oil (OMEGA-3) 500 MG CAPS Take by mouth.     levothyroxine (SYNTHROID) 25 MCG tablet Take 1 tablet (25 mcg total) by mouth daily before breakfast. 30 tablet 2   lidocaine-prilocaine (EMLA) cream Apply 1 application topically as needed. 30 g 1   Magnesium 250 MG  TABS Take by mouth.     Niacin (VITAMIN B-3 PO) Take 1 tablet by mouth daily.     omeprazole (PRILOSEC) 40 MG capsule Take 40 mg by mouth daily.     ondansetron (ZOFRAN-ODT) 4 MG disintegrating tablet Take 1 tablet (4 mg total) by mouth every 8 (eight) hours as needed. (Patient not taking: Reported on 08/11/2021) 20 tablet 0   prochlorperazine (COMPAZINE) 10 MG tablet TAKE 1 TABLET(10 MG) BY MOUTH EVERY 6 HOURS AS NEEDED FOR NAUSEA OR VOMITING (Patient not taking: Reported on 08/11/2021) 30 tablet 0   triamcinolone cream (KENALOG) 0.1 % Apply 1 application topically daily as needed. 453 g 6   No current facility-administered medications for this visit.    SURGICAL HISTORY:  Past Surgical History:  Procedure Laterality Date   IR IMAGING GUIDED PORT INSERTION  11/24/2020   KNEE SURGERY     orthoscopic     TONSILLECTOMY AND ADENOIDECTOMY      REVIEW OF SYSTEMS:   Review of Systems  Constitutional: Negative for appetite change, chills, fatigue, fever and unexpected weight change.  HENT:   Negative for mouth sores, nosebleeds, sore throat and trouble swallowing.   Eyes: Negative for eye problems and icterus.  Respiratory: Negative for cough, hemoptysis, shortness of breath and wheezing.  Cardiovascular: Negative for chest pain and leg swelling.  Gastrointestinal: Negative for abdominal pain, constipation, diarrhea, nausea and vomiting.  Genitourinary: Negative for bladder incontinence, difficulty urinating, dysuria, frequency and hematuria.   Musculoskeletal: Negative for back pain, gait problem, neck pain and neck stiffness.  Skin: Negative for itching and rash.  Neurological: Negative for dizziness, extremity weakness, gait problem, headaches, light-headedness and seizures.  Hematological: Negative for adenopathy. Does not bruise/bleed easily.  Psychiatric/Behavioral: Negative for confusion, depression and sleep disturbance. The patient is not nervous/anxious.     PHYSICAL EXAMINATION:   There were no vitals taken for this visit.  ECOG PERFORMANCE STATUS: {CHL ONC ECOG [id number]  Physical Exam  Constitutional: Oriented to person, place, and time and well-developed, well-nourished, and in no distress. No distress.  HENT:  Head: Normocephalic and atraumatic.  Mouth/Throat: Oropharynx is clear and moist. No oropharyngeal exudate.  Eyes: Conjunctivae are normal. Right eye exhibits no discharge. Left eye exhibits no discharge. No scleral icterus.  Neck: Normal range of motion. Neck supple.  Cardiovascular: Normal rate, regular rhythm, normal heart sounds and intact distal pulses.   Pulmonary/Chest: Effort normal and breath sounds normal. No respiratory distress. No wheezes. No rales.  Abdominal: Soft. Bowel sounds are normal. Exhibits no distension and no mass. There is no tenderness.  Musculoskeletal: Normal range of motion. Exhibits no edema.  Lymphadenopathy:    No cervical adenopathy.  Neurological: Alert and oriented to person, place, and time. Exhibits normal muscle tone. Gait normal. Coordination normal.  Skin: Skin is warm and dry. No rash noted. Not diaphoretic. No erythema. No pallor.  Psychiatric: Mood, memory and judgment normal.  Vitals reviewed.  LABORATORY DATA: Lab Results  Component Value Date   WBC 7.4 08/24/2021   HGB 10.2 (L) 08/24/2021   HCT 30.2 (L) 08/24/2021   MCV 98.7 08/24/2021   PLT 102 (L) 08/24/2021      Chemistry      Component Value Date/Time   NA 135 08/24/2021 1152   K 4.4 08/24/2021 1152   CL 105 08/24/2021 1152   CO2 23 08/24/2021 1152   BUN 19 08/24/2021 1152   CREATININE 1.14 08/24/2021 1152   CREATININE 0.95 08/11/2021 0958      Component Value Date/Time   CALCIUM 9.5 08/24/2021 1152   ALKPHOS 71 08/24/2021 1152   AST 29 08/24/2021 1152   AST 16 08/11/2021 0958   ALT 20 08/24/2021 1152   ALT 9 08/11/2021 0958   BILITOT <0.1 (L) 08/24/2021 1152   BILITOT 0.5 08/11/2021 0958       RADIOGRAPHIC  STUDIES:  DG Chest 2 View  Result Date: 08/24/2021 CLINICAL DATA:  Fever, shortness of breath EXAM: CHEST - 2 VIEW COMPARISON:  Previous studies including CT done on 07/16/2021 and chest radiograph done on 07/21/2020 FINDINGS: Cardiac size is within normal limits. Tip of right IJ chest port is seen in superior vena cava. Alveolar densities are seen in left parahilar region. Increased interstitial markings are seen in both lower lung fields. There is no pleural effusion or pneumothorax. IMPRESSION: There is alveolar density overlying the left hilum and left perihilar region suggesting pneumonia or underlying neoplastic process. Increased interstitial markings in both lower lung fields suggest scarring or interstitial pneumonia. Electronically Signed   By: Elmer Picker M.D.   On: 08/24/2021 13:18   MR BRAIN W WO CONTRAST  Result Date: 07/29/2021 CLINICAL DATA:  Brain/CNS neoplasm, assess treatment response. Metastatic lung cancer. Prior SRS to a left temporal lesion. EXAM: MRI HEAD WITHOUT  AND WITH CONTRAST TECHNIQUE: Multiplanar, multiecho pulse sequences of the brain and surrounding structures were obtained without and with intravenous contrast. CONTRAST:  12mL MULTIHANCE GADOBENATE DIMEGLUMINE 529 MG/ML IV SOLN COMPARISON:  Head MRI 06/12/2021 FINDINGS: Brain: New lesions: None. Larger lesions: 1. 4 mm enhancing lesion in the cerebellar vermis which in retrospect was subtly visible on the prior study but has increased in size (series 11, image 43). No edema. 2. 3 mm enhancing lesion in the medial right occipital lobe, also subtly visible on the prior study but with interval increase in size (series 11, image 70). No edema. 3. Increased size of the previously treated, centrally necrotic left temporal lobe lesion which now measures 3.1 x 2.6 cm with greatly increased, extensive surrounding vasogenic edema. Stable or smaller lesions: None. Other brain findings: Left temporal lobe edema results in sulcal  effacement and effacement of the temporal horn of the left lateral ventricle. There is at most trace rightward midline shift. There is no evidence of hydrocephalus, acute infarct, acute intracranial hemorrhage, or extra-axial fluid collection. There is mild cerebral atrophy. Vascular: Major intracranial vascular flow voids are preserved. Skull and upper cervical spine: No suspicious marrow lesion. Sinuses/Orbits: Unremarkable orbits. No significant inflammatory disease in the paranasal sinuses. Trace left mastoid effusion. Other: None. IMPRESSION: 1. 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 2. Increased size of 2 previously unreported lesions measuring 3-4 mm in the right occipital lobe and cerebellar vermis consistent with metastases. No edema. Electronically Signed   By: Logan Bores M.D.   On: 07/29/2021 14:34     ASSESSMENT/PLAN:  No problem-specific Assessment & Plan notes found for this encounter.   No orders of the defined types were placed in this encounter.    I spent {CHL ONC TIME VISIT - MBWGY:[id number]} counseling the patient face to face. The total time spent in the appointment was {CHL ONC TIME VISIT - BLTJQ:[id number]}.  Peter Brown L Peter Bedoya, PA-C 08/24/21

## 2021-08-24 NOTE — ED Triage Notes (Signed)
Pt reports generalized weakness and SHOB over the past few days. Pts family reports increased AMS over the past few days. Pt is A&O x1 in triage.

## 2021-08-24 NOTE — ED Provider Notes (Signed)
[facility] DEPT Provider Note   CSN: [id number] Arrival date & time: 08/24/21  1110     History  Chief Complaint  Patient presents with   Weakness   Shortness of Breath    Peter Brown is a 81 y.o. male.  HPI Patient presents with family members who provides much of the history.  Additional details are obtained on chart review from radiation oncology notes from last month. Patient has history of metastatic lung cancer, COVID last year, now presents with dyspnea, weakness, gait instability and confusion. Family notes symptoms have been worsening over the past 3 or 4 days despite of change in medications, diet, activity.  Last chemotherapy was about 3 weeks ago. Patient has not had a fall, though he is capable of ambulating, now, without assistance.     Home Medications Prior to Admission medications   Medication Sig Start Date End Date Taking? Authorizing Provider  albuterol (VENTOLIN HFA) 108 (90 Base) MCG/ACT inhaler Inhale 2 puffs into the lungs every 4 (four) hours as needed. Every 4-6 hours 06/25/21   [provider]  aspirin 81 MG EC tablet Take by mouth.    [provider]  Bacillus Coagulans-Inulin (PROBIOTIC) 1-250 BILLION-MG CAPS Take 1 tablet by mouth daily.    [provider]  bisacodyl (DULCOLAX) 5 MG EC tablet Take 5 mg by mouth 3 (three) times daily as needed for moderate constipation.    [provider]  Cholecalciferol (D3 PO) Take 1 tablet by mouth daily.    [provider]  coal tar-salicylic acid 2 % shampoo Apply topically daily as needed for itching.    [provider]  dexamethasone (DECADRON) 2 MG tablet Take 2 tablets (4 mg total) by mouth daily for 7 days, THEN 1 tablet (2 mg total) daily. 08/04/21 10/10/21  Ventura Sellers, MD  DULoxetine (CYMBALTA) 30 MG capsule Take 30 mg by mouth daily. 06/19/19   [provider]  fexofenadine (ALLEGRA) 180 MG tablet Take 180 mg  by mouth daily.    [provider]  fluocinonide cream (LIDEX) 2.58 % Apply 1 application topically 2 (two) times daily.    [provider]  folic acid (FOLVITE) 1 MG tablet TAKE 1 TABLET(1 MG) BY MOUTH DAILY 12/29/20   Curt Bears, MD  hydrocortisone cream 1 % Apply 1 application topically 2 (two) times daily. 09/04/20   Heilingoetter, Cassandra L, PA-C  ketoconazole (NIZORAL) 2 % cream Apply 1 application topically daily.    [provider]  Astrid Drafts (OMEGA-3) 500 MG CAPS Take by mouth.    [provider]  levothyroxine (SYNTHROID) 25 MCG tablet Take 1 tablet (25 mcg total) by mouth daily before breakfast. 07/21/21   Heilingoetter, Cassandra L, PA-C  lidocaine-prilocaine (EMLA) cream Apply 1 application topically as needed. 12/12/20   Curt Bears, MD  Magnesium 250 MG TABS Take by mouth.    [provider]  Niacin (VITAMIN B-3 PO) Take 1 tablet by mouth daily.    [provider]  omeprazole (PRILOSEC) 40 MG capsule Take 40 mg by mouth daily.    [provider]  ondansetron (ZOFRAN-ODT) 4 MG disintegrating tablet Take 1 tablet (4 mg total) by mouth every 8 (eight) hours as needed. Patient not taking: Reported on 08/11/2021 01/13/21   Curt Bears, MD  prochlorperazine (COMPAZINE) 10 MG tablet TAKE 1 TABLET(10 MG) BY MOUTH EVERY 6 HOURS AS NEEDED FOR NAUSEA OR VOMITING Patient not taking: Reported on 08/11/2021 01/22/21  Curt Bears, MD  triamcinolone cream (KENALOG) 0.1 % Apply 1 application topically daily as needed. 09/16/20   Warren Danes, PA-C      Allergies    Patient has no known allergies.    Review of Systems   Review of Systems  All other systems reviewed and are negative.   Physical Exam Updated Vital Signs BP 110/77   Pulse 73   Temp 98.2 F (36.8 C) (Oral)   Resp (!) 21   SpO2 92%  Physical Exam Vitals and nursing note reviewed.  Constitutional:      General: He is not in acute distress.     Appearance: He is well-developed. He is ill-appearing.  HENT:     Head: Normocephalic and atraumatic.  Eyes:     Conjunctiva/sclera: Conjunctivae normal.  Cardiovascular:     Rate and Rhythm: Normal rate and regular rhythm.  Pulmonary:     Effort: Tachypnea present.     Breath sounds: No wheezing.  Abdominal:     General: There is no distension.  Skin:    General: Skin is warm and dry.  Neurological:     Mental Status: He is alert and oriented to person, place, and time.     Comments: Confusion, repetitive speech, perseveration on insignificant details. There is mild age-appropriate atrophy, the patient does move all extremity spontaneously and to command.  Face is symmetric  Psychiatric:        Cognition and Memory: Cognition is impaired. Memory is impaired.     ED Results / Procedures / Treatments   Labs (all labs ordered are listed, but only abnormal results are displayed) Labs Reviewed  COMPREHENSIVE METABOLIC PANEL - Abnormal; Notable for the following components:      Result Value   Glucose, Bld 158 (*)    Albumin 3.3 (*)    Total Bilirubin <0.1 (*)    All other components within normal limits  CBC WITH DIFFERENTIAL/PLATELET - Abnormal; Notable for the following components:   RBC 3.06 (*)    Hemoglobin 10.2 (*)    HCT 30.2 (*)    Platelets 102 (*)    All other components within normal limits  URINALYSIS, ROUTINE W REFLEX MICROSCOPIC - Abnormal; Notable for the following components:   APPearance HAZY (*)    All other components within normal limits  SARS CORONAVIRUS 2 BY RT PCR    EKG None  Radiology CT Angio Chest PE W and/or Wo Contrast  Result Date: 08/24/2021 CLINICAL DATA:  Shortness of breath, fever EXAM: CT ANGIOGRAPHY CHEST WITH CONTRAST TECHNIQUE: Multidetector CT imaging of the chest was performed using the standard protocol during bolus administration of intravenous contrast. Multiplanar CT image reconstructions and MIPs were obtained to evaluate  the vascular anatomy. RADIATION DOSE REDUCTION: This exam was performed according to the departmental dose-optimization program which includes automated exposure control, adjustment of the mA and/or kV according to patient size and/or use of iterative reconstruction technique. CONTRAST:  69mL OMNIPAQUE IOHEXOL 350 MG/ML SOLN COMPARISON:  CT 07/16/2021 FINDINGS: Cardiovascular: Satisfactory opacification of the pulmonary arteries to the segmental level. No evidence of pulmonary embolism. Thoracic aorta is nonaneurysmal. Atherosclerotic calcification of the aorta and coronary arteries. Normal heart size. No pericardial effusion. Right-sided chest port in place with distal tip terminating at the level of the right atrium. Mediastinum/Nodes: No enlarged mediastinal, hilar, or axillary lymph nodes. Thyroid gland, trachea, and esophagus demonstrate no significant findings. Lungs/Pleura: Extensive ground-glass opacities throughout both lungs with more consolidative opacity in the  left perihilar region. 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 Known left upper lobe pulmonary nodule is not well assessed on the current study given adjacent consolidation. No new pulmonary nodules or masses are identified. No pleural effusion or pneumothorax. Upper Abdomen: No acute abnormality. Musculoskeletal: No new or acute osseous findings. No suspicious lytic or sclerotic bone lesions. Review of the MIP images confirms the above findings. IMPRESSION: 1. No evidence of acute pulmonary embolism. 2. Extensive ground-glass opacities throughout both lungs with more consolidative opacity in the left perihilar region. Findings are most suggestive of multifocal pneumonia. Radiographic follow-up after appropriate treatment is recommended to document resolution. 3. Known left upper lobe pulmonary nodule is not well assessed on the current study given adjacent consolidation. No new pulmonary nodules or  masses are identified. Aortic Atherosclerosis (ICD10-I70.0). Electronically Signed   By: Davina Poke D.O.   On: 08/24/2021 15:05   DG Chest 2 View  Result Date: 08/24/2021 CLINICAL DATA:  Fever, shortness of breath EXAM: CHEST - 2 VIEW COMPARISON:  Previous studies including CT done on 07/16/2021 and chest radiograph done on 07/21/2020 FINDINGS: Cardiac size is within normal limits. Tip of right IJ chest port is seen in superior vena cava. Alveolar densities are seen in left parahilar region. Increased interstitial markings are seen in both lower lung fields. There is no pleural effusion or pneumothorax. IMPRESSION: There is alveolar density overlying the left hilum and left perihilar region suggesting pneumonia or underlying neoplastic process. Increased interstitial markings in both lower lung fields suggest scarring or interstitial pneumonia. Electronically Signed   By: Elmer Picker M.D.   On: 08/24/2021 13:18    Procedures Procedures    Medications Ordered in ED Medications  azithromycin (ZITHROMAX) 500 mg in sodium chloride 0.9 % 250 mL IVPB (has no administration in time range)  cefTRIAXone (ROCEPHIN) 1 g in sodium chloride 0.9 % 100 mL IVPB (1 g Intravenous New Bag/Given 08/24/21 1549)  acetaminophen (TYLENOL) tablet 1,000 mg (1,000 mg Oral Given 08/24/21 1414)  iohexol (OMNIPAQUE) 350 MG/ML injection 100 mL (48 mLs Intravenous Contrast Given 08/24/21 1425)  sodium chloride (PF) 0.9 % injection (  Given by Other 08/24/21 1425)    ED Course/ Medical Decision Making/ A&P This patient with a Hx of metastatic lung cancer, COVID, chemotherapy last month presents to the ED for concern of fusion, dyspnea, gait difficulty, repetitive speech, this involves an extensive number of treatment options, and is a complaint that carries with it a high risk of complications and morbidity.    The differential diagnosis includes progression of disease, CVA, pneumonia, bacteremia, sepsis   Social  Determinants of Health:  Age, cancer  Additional history obtained:  Additional history and/or information obtained from family members and chart review as above details included in HPI.  In addition, patient's neurosurgery team performed radiation therapy of brain mets last month, seemingly without complication,   After the initial evaluation, orders, including: MRI brain, x-ray, labs were initiated.   Patient placed on Cardiac and Pulse-Oximetry Monitors. The patient was maintained on a cardiac monitor.  The cardiac monitored showed an rhythm of 90 sinus normal The patient was also maintained on pulse oximetry. The readings were typically 97% room air normal   On repeat evaluation of the patient stayed the same  Lab Tests:  I personally interpreted labs.  The pertinent results include: Mild hyperglycemia  Imaging Studies ordered:  I independently visualized and interpreted imaging which showed multifocal pneumonia on CT  of the chest I agree with the radiologist interpretation  Consultations Obtained:  I requested consultation with the oncology, Dr. Earlie Server,  and discussed lab and imaging findings as well as pertinent plan - they recommend: Admission consideration of repeat MRI  Dispostion / Final MDM:  After consideration of the diagnostic results and the patient's response to treatment, adult male presenting with confusion, dyspnea, fatigue in the context of ongoing therapy for lung cancer with known brain metastases.  Patient is tachypneic, borderline hypoxic, with differential including pneumonia, PE, progression of disease, invasive metastases had broad differential, as above requiring CT x-ray MRI which is pending on admission.  Discussed this case with his oncologist, who is in agreement, family, patient all aware, patient admitted for further monitoring, management.  Final Clinical Impression(s) / ED Diagnoses Final diagnoses:  Multifocal pneumonia  Delirium      Carmin Muskrat, MD 08/24/21 [phone]

## 2021-08-24 NOTE — H&P (Signed)
History and Physical    Patient: Peter Brown WER:[id number] DOB: August 30, 1940 DOA: 08/24/2021 DOS: the patient was seen and examined on 08/24/2021 PCP: [name], MD  Patient coming from: Home  Chief Complaint:  Chief Complaint  Patient presents with   Weakness   Shortness of Breath   HPI: Peter Brown is a 81 y.o. 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  Family members also report that the patient's mental status has been impaired at times over the past few days.  He was initially febrile.  He feels better and able to answer questions.  However, the patient although has better orientation now seems to be repeating the same statements over and over.  His wife stated that he has not been coughing until today.  No rhinorrhea or sore throat.  No wheezing or hemoptysis.  Positive mild pleuritic chest pain, but no typical CP, palpitations, diaphoresis, PND, orthopnea or pitting edema of the lower extremities.  No appetite changes, abdominal pain, diarrhea, constipation, melena or hematochezia.  No flank pain, dysuria, frequency or hematuria.  No polyuria, polydipsia, polyphagia or blurred vision.  ED course: Initial vital signs were temperature 101.9 F, pulse 106, respiration 18, BP 111/60 mmHg O2 sat 94% on room air.  The patient received azithromycin and ceftriaxone.  Lab work: Urinalysis was hazy in appearance, but otherwise unremarkable.  Coronavirus PCR was negative.  CBC showed a white count 7.4, hemoglobin 10.2 g/dL platelets 102.  CMP with a glucose of 158 and total bilirubin of less than 0.1 mg/dL.  Albumin was 3.3 g/dL.  The rest of the CMP results were unremarkable.  Imaging: No PE.   There was extensive groundglass opacities suggesting multifocal pneumonia.  There was aortic atherosclerosis.   Review of Systems: As mentioned in the history of present illness. All other systems reviewed and are negative. 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 ICD10 Conversion   Past Surgical History:  Procedure Laterality Date   IR IMAGING GUIDED PORT INSERTION  11/24/2020   KNEE SURGERY     orthoscopic     TONSILLECTOMY AND ADENOIDECTOMY     Social History:  reports that he has never smoked. He has never used smokeless tobacco. He reports that he does not drink alcohol and does not use drugs.  No Known Allergies  History reviewed. No pertinent family history.  Prior to Admission medications   Medication Sig Start Date End Date Taking? Authorizing Provider  acetaminophen (TYLENOL) 500 MG tablet Take 500 mg by mouth every 6 (six) hours as needed for headache.   Yes [provider]  albuterol (VENTOLIN HFA) 108 (90 Base) MCG/ACT inhaler Inhale 2 puffs into the lungs every 4 (four) hours as needed for wheezing or shortness of breath. 06/25/21  Yes [provider]  aspirin 81 MG EC tablet Take 81 mg by mouth daily.   Yes [provider]  Bacillus Coagulans-Inulin (PROBIOTIC) 1-250 BILLION-MG CAPS  Take 1 tablet by mouth daily.   Yes [provider]  bisacodyl (DULCOLAX) 5 MG EC tablet Take 5 mg by mouth 3 (three) times daily as needed for moderate constipation.   Yes [provider]  Cholecalciferol (D3 PO) Take 1 tablet by mouth daily.   Yes [provider]  coal tar-salicylic acid 2 %  shampoo Apply topically daily as needed for itching.   Yes [provider]  dexamethasone (DECADRON) 2 MG tablet Take 2 tablets (4 mg total) by mouth daily for 7 days, THEN 1 tablet (2 mg total) daily. 08/04/21 10/10/21 Yes Vaslow, Acey Lav, MD  DULoxetine (CYMBALTA) 30 MG capsule Take 30 mg by mouth daily. 06/19/19  Yes [provider]  fexofenadine (ALLEGRA) 180 MG tablet Take 180 mg by mouth daily as needed for allergies.   Yes [provider]  fluocinonide cream (LIDEX) 5.59 % Apply 1 application  topically 2 (two) times daily as needed (psroasis).   Yes [provider]  folic acid (FOLVITE) 1 MG tablet TAKE 1 TABLET(1 MG) BY MOUTH DAILY 12/29/20  Yes Curt Bears, MD  hydrocortisone cream 1 % Apply 1 application topically 2 (two) times daily. Patient taking differently: Apply 1 application  topically 2 (two) times daily as needed (rash). 09/04/20  Yes Heilingoetter, Cassandra L, PA-C  ketoconazole (NIZORAL) 2 % cream Apply 1 application  topically daily as needed (rash).   Yes [provider]  Astrid Drafts (OMEGA-3) 500 MG CAPS Take 1 capsule by mouth daily.   Yes [provider]  levothyroxine (SYNTHROID) 25 MCG tablet Take 1 tablet (25 mcg total) by mouth daily before breakfast. 07/21/21  Yes Heilingoetter, Cassandra L, PA-C  lidocaine-prilocaine (EMLA) cream Apply 1 application topically as needed. Patient taking differently: Apply 1 application  topically daily as needed (access port). 12/12/20  Yes Curt Bears, MD  Magnesium 500 MG TABS Take 1 tablet by mouth every evening.   Yes [provider]  Niacin (VITAMIN B-3 PO) Take 1 tablet by mouth daily.   Yes [provider]  omeprazole (PRILOSEC) 40 MG capsule Take 40 mg by mouth every evening.   Yes [provider]  ondansetron (ZOFRAN-ODT) 4 MG disintegrating tablet Take 1 tablet (4 mg total) by mouth every 8 (eight) hours as needed. Patient not taking: Reported  on 08/11/2021 01/13/21   Curt Bears, MD  prochlorperazine (COMPAZINE) 10 MG tablet TAKE 1 TABLET(10 MG) BY MOUTH EVERY 6 HOURS AS NEEDED FOR NAUSEA OR VOMITING Patient not taking: Reported on 08/11/2021 01/22/21   Curt Bears, MD  triamcinolone cream (KENALOG) 0.1 % Apply 1 application topically daily as needed. Patient not taking: Reported on 08/24/2021 09/16/20   Warren Danes, [place]    Physical Exam: Vitals:   08/24/21 1615 08/24/21 1630 08/24/21 1645 08/24/21 1700  BP: 111/69 115/69 106/83 98/71  Pulse: 72 78 79 75  Resp: (!) 21 (!) 22 (!) 22 18  Temp:      TempSrc:      SpO2: 91% 95% 95% 95%   Physical Exam Vitals and nursing note reviewed.  Constitutional:      Appearance: Normal appearance. He is well-developed.  HENT:     Head: Normocephalic.     Nose: No rhinorrhea.     Mouth/Throat:     Pharynx: No oropharyngeal exudate.  Eyes:     General: No scleral icterus.    Pupils: Pupils are equal, round, and reactive to light.  Neck:  Vascular: No JVD.  Cardiovascular:     Rate and Rhythm: Normal rate and regular rhythm.     Heart sounds: S1 normal and S2 normal.  Pulmonary:     Breath sounds: Rales present. No decreased breath sounds, wheezing or rhonchi.  Abdominal:     General: Bowel sounds are normal.     Palpations: Abdomen is soft.     Tenderness: There is no abdominal tenderness.  Musculoskeletal:     Cervical back: Neck supple.     Right lower leg: No edema.     Left lower leg: No edema.  Skin:    General: Skin is warm and dry.  Neurological:     General: No focal deficit present.     Mental Status: He is alert and oriented to person, place, and time.  Psychiatric:        Mood and Affect: Mood normal.        Behavior: Behavior normal. Behavior is cooperative.    Data Reviewed:  There are no new results to review at this time.  Assessment and Plan: Principal Problem:   Multifocal pneumonia Admit to telemetry/inpatient. Continue  supplemental oxygen. Scheduled and as needed bronchodilators. Continue ceftriaxone 1 g IVPB daily. Continue azithromycin 500 mg IVPB daily. Check strep pneumoniae urinary antigen. Check sputum Gram stain, culture and sensitivity. Follow-up blood culture and sensitivity. Follow-up CBC and chemistry in the morning.  Active Problems:   Adenocarcinoma, lung, left ([place])   Malignant neoplasm metastatic to brain (HCC) Continue dexamethasone 2 mg p.o. daily. MRI of the brain ordered per Dr. Earlie Server.    Anxiety Continue duloxetine 30 mg p.o. daily.    Hiatal hernia   Barrett's esophagus without dysplasia Continue omeprazole 40 mg daily or formulary equivalent.    Normocytic anemia/thrombocytopenia Monitor CBC closely.    Mild protein malnutrition (HCC) Protein supplementation.    Advance Care Planning:   Code Status:   Consults:   Family Communication: His daughter and wife were present in the room.  Severity of Illness: The appropriate patient status for this patient is INPATIENT. Inpatient status is judged to be reasonable and necessary in order to provide the required intensity of service to ensure the patient's safety. The patient's presenting symptoms, physical exam findings, and initial radiographic and laboratory data in the context of their chronic comorbidities is felt to place them at high risk for further clinical deterioration. Furthermore, it is not anticipated that the patient will be medically stable for discharge from the hospital within 2 midnights of admission.   * I certify that at the point of admission it is my clinical judgment that the patient will require inpatient hospital care spanning beyond 2 midnights from the point of admission due to high intensity of service, high risk for further deterioration and high frequency of surveillance required.*  Author: Reubin Milan, MD 08/24/2021 6:21 PM  For on call review [email or web address].   This document was  prepared using Dragon voice recognition software and may contain some unintended transcription errors.

## 2021-08-25 DIAGNOSIS — C3492 Malignant neoplasm of unspecified part of left bronchus or lung: Secondary | ICD-10-CM

## 2021-08-25 DIAGNOSIS — D649 Anemia, unspecified: Secondary | ICD-10-CM

## 2021-08-25 DIAGNOSIS — J189 Pneumonia, unspecified organism: Secondary | ICD-10-CM | POA: Diagnosis not present

## 2021-08-25 LAB — CBC
HCT: 27.8 % — ABNORMAL LOW (ref 39.0–52.0)
Hemoglobin: 9.3 g/dL — ABNORMAL LOW (ref 13.0–17.0)
MCH: 33 pg (ref 26.0–34.0)
MCHC: 33.5 g/dL (ref 30.0–36.0)
MCV: 98.6 fL (ref 80.0–100.0)
Platelets: 108 10*3/uL — ABNORMAL LOW (ref 150–400)
RBC: 2.82 MIL/uL — ABNORMAL LOW (ref 4.22–5.81)
RDW: 12.7 % (ref 11.5–15.5)
WBC: 5.8 10*3/uL (ref 4.0–10.5)
nRBC: 0 % (ref 0.0–0.2)

## 2021-08-25 LAB — COMPREHENSIVE METABOLIC PANEL
ALT: 17 U/L (ref 0–44)
AST: 21 U/L (ref 15–41)
Albumin: 2.8 g/dL — ABNORMAL LOW (ref 3.5–5.0)
Alkaline Phosphatase: 63 U/L (ref 38–126)
Anion gap: 4 — ABNORMAL LOW (ref 5–15)
BUN: 19 mg/dL (ref 8–23)
CO2: 26 mmol/L (ref 22–32)
Calcium: 8.9 mg/dL (ref 8.9–10.3)
Chloride: 109 mmol/L (ref 98–111)
Creatinine, Ser: 1.03 mg/dL (ref 0.61–1.24)
GFR, Estimated: >60 mL/min (ref >60)
Glucose, Bld: 93 mg/dL (ref 70–99)
Potassium: 4.1 mmol/L (ref 3.5–5.1)
Sodium: 139 mmol/L (ref 135–145)
Total Bilirubin: <0.1 mg/dL — ABNORMAL LOW (ref 0.3–1.2)
Total Protein: 5.7 g/dL — ABNORMAL LOW (ref 6.5–8.1)

## 2021-08-25 MED ORDER — SODIUM CHLORIDE 0.9% FLUSH
10.0000 mL | Freq: Two times a day (BID) | INTRAVENOUS | Status: DC
  Administered 2021-08-25 – 2021-08-27 (×3): 10 mL

## 2021-08-25 MED ORDER — CHLORHEXIDINE GLUCONATE CLOTH 2 % EX PADS
6.0000 | MEDICATED_PAD | Freq: Every day | CUTANEOUS | Status: DC
  Administered 2021-08-25 – 2021-08-26 (×2): 6 via TOPICAL

## 2021-08-25 NOTE — TOC Initial Note (Signed)
Transition of Care [facility]) - Initial/Assessment Note    Patient Details  Name: Peter Brown MRN: [id number] Date of Birth: July 31, 1940  [facility]) CM/SW Contact:    Roseanne Kaufman, RN Phone Number: 08/25/2021, 2:16 PM  Clinical Narrative:           Current home oxygen with [employer], and has walker at home.  Pharmacy: [facility] in [place], transportation at d/c: family.  TOC will continue to follow.         Expected Discharge Plan: Home/Self Care Barriers to Discharge: Continued Medical Work up   Patient Goals and CMS Choice Patient states their goals for this hospitalization and ongoing recovery are:: return home to feel better      Expected Discharge Plan and Services Expected Discharge Plan: Home/Self Care   Discharge Planning Services: CM Consult   Living arrangements for the past 2 months: Single Family Home                                      Prior Living Arrangements/Services Living arrangements for the past 2 months: Single Family Home Lives with:: Spouse Patient language and need for interpreter reviewed:: Yes Do you feel safe going back to the place where you live?: Yes      Need for Family Participation in Patient Care: Yes (Comment) Care giver support system in place?: Yes (comment) Current home services: DME (home oxygen with [employer]) Criminal Activity/Legal Involvement Pertinent to Current Situation/Hospitalization: No - Comment as needed  Activities of Daily Living Home Assistive Devices/Equipment: Eyeglasses, Hearing aid, Shower chair with back, Cane (specify quad or straight) (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) ADL Screening (condition at time of admission) Patient's cognitive ability adequate to safely complete daily activities?: Yes Is the patient deaf or have difficulty hearing?: Yes (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, hearing is fair in left ear, 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) Does the patient have difficulty seeing, even when wearing glasses/contacts?: No Does the  patient have difficulty concentrating, remembering, or making decisions?: Yes (trouble with memory) Patient able to express need for assistance with ADLs?: Yes Does the patient have difficulty dressing or bathing?: No Independently performs ADLs?: No Communication: Independent Dressing (OT): Independent Grooming: Independent Feeding: Independent Bathing: Independent Toileting: Needs assistance Is this a change from baseline?: Pre-admission baseline In/Out Bed: Needs assistance Is this a change from baseline?: Pre-admission baseline Walks in Home: Needs assistance Is this a change from baseline?: Pre-admission baseline Does the patient have difficulty walking or climbing stairs?: Yes Weakness of Legs: Both Weakness of Arms/Hands: Both  Permission Sought/Granted Permission sought to share information with : Case Manager Permission granted to share information with : Yes, Verbal Permission Granted  Share Information with NAME: case manager           Emotional Assessment Appearance:: Appears stated age Attitude/Demeanor/Rapport: Gracious Affect (typically observed): Accepting Orientation: : Oriented to Self Alcohol / Substance Use: Not Applicable Psych Involvement: No (comment)  Admission diagnosis:  Delirium [R41.0] Multifocal pneumonia [J18.9] Patient Active Problem List   Diagnosis Date Noted   Multifocal pneumonia 08/24/2021   Normocytic anemia 08/24/2021   Mild protein malnutrition ([place]) 08/24/2021   Fatigue 07/21/2021   Acquired hypothyroidism 06/24/2021   Hardening of the aorta (main artery of the heart) ([place]) 03/19/2021   Port-A-Cath in place 02/02/2021   Encounter for antineoplastic immunotherapy 11/10/2020   Allergic rhinitis 10/14/2020   Disturbance of skin sensation 10/14/2020  Generalized osteoarthrosis, unspecified site 10/14/2020   Mixed hyperlipidemia 10/14/2020   Other psoriasis 10/14/2020   Spondylosis, lumbosacral 10/14/2020   Drug rash 09/04/2020    Malignant neoplasm metastatic to brain ([place]) 08/21/2020   Adenocarcinoma, lung, left ([place]) 08/04/2020   Encounter for antineoplastic chemotherapy 08/04/2020   Lung nodules 07/14/2020   Pleural effusion, left 07/14/2020   Exposure to asbestos 07/02/2020   Mass of upper lobe of left lung 07/02/2020   CKD (chronic kidney disease) stage 2, GFR 60-89 ml/min 08/17/2019   Lumbar radiculopathy 01/19/2018   Hyperglycemia 04/18/2017   Diverticulosis of large intestine without hemorrhage 12/30/2014   Calculus of kidney 03/28/2014   Barrett's esophagus without dysplasia 11/28/2013   Gastroesophageal reflux disease with esophagitis without hemorrhage 11/27/2013   Hemorrhoids, internal 11/27/2013   Hiatal hernia 11/27/2013   Polyp of colon, adenomatous 11/27/2013   Anxiety 01/03/2013   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[place]) 03/05/2011   Screening for prostate cancer 01/29/2010   S/P arthroscopy of knee 01/29/2010   Vitamin D deficiency 02/27/2008   Insomnia 02/28/2006   Depression 02/04/2006   Diverticulitis of colon 07/05/2005   Pain in joint, ankle and foot 03/04/2000   Disturbances of sensation of smell and taste 09/26/1998   PCP:  [name], MD Pharmacy:   [facility] [place], [place] [name] [place] [address] Phone: [phone] Fax: [phone]     Social Determinants of Health (SDOH) Interventions    Readmission Risk Interventions     No data to display

## 2021-08-25 NOTE — Evaluation (Signed)
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 ICD10 Conversion   Past Surgical History:  Past Surgical History:  Procedure Laterality Date   IR IMAGING GUIDED PORT INSERTION  11/24/2020   KNEE SURGERY     orthoscopic     TONSILLECTOMY AND ADENOIDECTOMY     HPI:  Patient is a 81 year old male who presented with shortness of breath and generalized weakness. patient was found to have multifocal pneumonia, acute metabolic encephalopathy.DHR:CBULAGT'X esophagus, lung cancer metastatic to brain, hyperlipidemia, psoriasis, precancerous melanosis, lumbosacral spondylosis, vitamin D deficiency, seasonal allergies.  Pt with brain mets to anterior left temporal lobe.  He resides with his wife of nearly 57 years and is ambulatory PTA.  Swallow evaluation ordered. Pt denies significant dysphagia prior to admit.    Assessment / Plan / Recommendation  Clinical Impression  Patient presents with functional oropharyngeal swallow ability based on clinical swallow evaluation. He easily passed the 3 ounce Yale swallow screen and swallow appears swift without indications of retention. Pt does admit to occasionally having issues with "something" on the left side of his throat- "congestion" - he has h/o Barrett's - thus suspect this may be source of sensation.  Pt able to self feed without  difficulty.  Noted pt with pill rolling movement on his left more than right hand, without his awareness and this ceases when it is brought to his attention.  He also appears with masked facies.  Spouse reports pt has been "moving his hands all the time" for a "long time" and she states pt's brother does the same thing.  Reviewed with pt/family recommendation for general aspiration precautions, especially given is undergoing treatement for his lung cancer.  Given h/o Barrett's, recommend follow esophageal precautions.  Of note, pt with some receptive language deficits with higher level tasks and frequently says "thank you" - suspect his language difficulties are due to his brain metasteses.  If SLP evaluation for language is desired, please order.  Pt has excellent support from his wife. SLP Visit Diagnosis: Dysphagia, oropharyngeal phase (R13.12)    Aspiration Risk  Mild aspiration risk    Diet Recommendation Regular;Thin liquid   Liquid Administration via: Cup;Straw Medication Administration: Whole meds with liquid Supervision: Patient able to self feed Compensations: Slow rate;Small sips/bites Postural Changes: Seated upright at 90 degrees;Remain upright for at least 30 minutes after po intake    Other  Recommendations Oral Care Recommendations: Oral care BID    Recommendations for follow up therapy are one component of a multi-disciplinary discharge planning process, led by the attending physician.  Recommendations may be updated based on patient status, additional functional criteria and insurance authorization.  Follow up Recommendations No SLP follow up      Assistance Recommended at Discharge Frequent or constant Supervision/Assistance  Functional Status Assessment Patient has not had a recent decline in their functional status  Frequency and Duration  N/a      Prognosis   N/a     Swallow Study   General Date of Onset: 08/25/21 HPI: Patient is a 81 year old male who  presented with shortness of breath and generalized weakness. patient was found to have multifocal pneumonia, acute metabolic encephalopathy.HQR:FXJOITG'P esophagus, lung cancer metastatic to brain, hyperlipidemia, psoriasis, precancerous melanosis, lumbosacral spondylosis, vitamin D deficiency, seasonal allergies.  Pt with brain mets to anterior left temporal lobe.  He resides with his wife of nearly 85 years and is ambulatory PTA.  Swallow evaluation ordered. Pt denies significant dysphagia prior to admit. Type of Study: Bedside Swallow Evaluation Previous Swallow Assessment: has h/o Barrett's esophagus Diet Prior to this Study: Regular;Thin liquids Temperature Spikes Noted: Yes (was febrile but temp broke) Respiratory Status: Nasal cannula (2 liters) History of Recent Intubation: No Behavior/Cognition: Alert;Cooperative;Other (Comment) (mild language difficulties likely due to his brain mets but wife also reports pt has some edema of his brain and is being treated with steroid) Oral Cavity Assessment: Within Functional Limits Oral Care Completed by SLP: No Oral Cavity - Dentition: Adequate natural dentition Vision: Functional for self-feeding Self-Feeding Abilities: Able to feed self Patient Positioning: Upright in bed Baseline Vocal Quality: Low vocal intensity;Other (comment) (pt admits voice is not as strong as normal) Volitional Cough: Weak Volitional Swallow: Able to elicit    Oral/Motor/Sensory Function Overall Oral Motor/Sensory Function: Other (comment) (palatal elevation appeared sluggish but was bilateral)   Ice Chips Ice chips: Not tested   Thin Liquid Thin Liquid: Within functional limits Presentation: Straw Other Comments: 3 ounce Yale easily passed, as well as intake of other liquids    Nectar Thick Nectar Thick Liquid: Not tested   Honey Thick Honey Thick Liquid: Not tested   Puree Puree: Within functional limits Presentation: Self Fed;Spoon   Solid     Solid: Within  functional limits Presentation: Self Fredirick Lathe 08/25/2021,5:10 PM   Kathleen Lime, MS [facility] SLP [place] Office [phone] Pager [phone]

## 2021-08-25 NOTE — Progress Notes (Signed)
BSE completed, full report to follow. Pt without focal CN deficit - appears with slightly sluggish palatal elevation.  He easily pass the yale 3 ounce water challenge and was able to masticate solids without delay nor oral retention. Intake was at rapid rate, however no s/s of aspiration.  Recommend continue diet with general precautions.  Pt does endorse occasional reflux issues and per wife, he takes a PPI daily at home.  Reviewed general aspiration/dysphagia precautions with wife and pt including importance of ambulation, maintaining strength of cough, etc.  No SLP follow up needed.  Thanks for this consult.   Kathleen Lime, MS [facility] SLP Acute Rehab Services Office [phone] Pager [phone]

## 2021-08-25 NOTE — Evaluation (Signed)
Occupational Therapy Evaluation Patient Details Name: Peter Brown MRN: [id number] DOB: 12/02/1940 Today's Date: 08/25/2021   History of Present Illness patient is a 82 year old male who presented with shortness of breath and generalized weakness. patient was found to have multifocal pneumonia, acute metabolic encephalopathy.DVV:OHYWVPX'T esophagus, lung cancer metastatic to brain, hyperlipidemia, psoriasis, precancerous melanosis, lumbosacral spondylosis, vitamin D deficiency, seasonal allergies   Clinical Impression   Patient is a 81 year old male who was admitted for above. Patient was noted to have decreased O2 saturation  dropping to 83% on RA sitting on edge of bed with increased time, deep breathing education and need for 2l/min to return to 92%. Patients wife was present in room during session. Patient was noted to have decreased functional activity tolerance, decreased endurance, decreased standing balance,and decreased safety awareness impacting participation in ADLs. Patient would continue to benefit from skilled OT services at this time while admitted and after d/c to address noted deficits in order to improve overall safety and independence in ADLs.       Recommendations for follow up therapy are one component of a multi-disciplinary discharge planning process, led by the attending physician.  Recommendations may be updated based on patient status, additional functional criteria and insurance authorization.   Follow Up Recommendations  No OT follow up    Assistance Recommended at Discharge Frequent or constant Supervision/Assistance  Patient can return home with the following A little help with walking and/or transfers;A little help with bathing/dressing/bathroom;Assistance with cooking/housework;Direct supervision/assist for financial management;Assist for transportation;Help with stairs or ramp for entrance;Direct supervision/assist for medications management    Functional Status  Assessment  Patient has had a recent decline in their functional status and demonstrates the ability to make significant improvements in function in a reasonable and predictable amount of time.  Equipment Recommendations       Recommendations for Other Services       Precautions / Restrictions Precautions Precautions: Fall Precaution Comments: monitor O2 Restrictions Weight Bearing Restrictions: No      Mobility Bed Mobility Overal bed mobility: Modified Independent                  Transfers                          Balance Overall balance assessment: Mild deficits observed, not formally tested                                         ADL either performed or assessed with clinical judgement   ADL Overall ADL's : Needs assistance/impaired Eating/Feeding: Set up;Sitting   Grooming: Set up;Bed level Grooming Details (indicate cue type and reason): decreased O2 stats sitting EOB. Upper Body Bathing: Minimal assistance;Bed level   Lower Body Bathing: Bed level;Moderate assistance   Upper Body Dressing : Minimal assistance;Sitting   Lower Body Dressing: Sitting/lateral leans;Minimal assistance;Sit to/from stand   Toilet Transfer: Magazine features editor Details (indicate cue type and reason): patient was noted to furnture walk from edge of bed to commode. patient was noted to drop to 87% on 2L/min with continuation of decreased stats to 79% on 3L/min with patient reporting feeling cold. patient returned to bed with covers and O2 saturation back up to 93% on 2L/min. patient's nurse made aware. nurse in room assessing patient at this time. Toileting- Clothing Manipulation and Hygiene: Supervision/safety;Sit  to/from stand               Vision Baseline Vision/History: 1 Wears glasses Patient Visual Report: No change from baseline       Perception     Praxis      Pertinent Vitals/Pain Pain Assessment Pain Assessment: No/denies  pain     Hand Dominance Right   Extremity/Trunk Assessment Upper Extremity Assessment Upper Extremity Assessment: Overall WFL for tasks assessed   Lower Extremity Assessment Lower Extremity Assessment: Defer to PT evaluation       Communication Communication Communication: No difficulties   Cognition Arousal/Alertness: Awake/alert Behavior During Therapy: WFL for tasks assessed/performed Overall Cognitive Status: Difficult to assess                                 General Comments: wife present in room. wife able to cue patient with noted short attention span with cues from therapist.     General Comments       Exercises     Shoulder Instructions      Home Living Family/patient expects to be discharged to:: Private residence Living Arrangements: Spouse/significant other Available Help at Discharge: Family;Available 24 hours/day                         Home Equipment: None          Prior Functioning/Environment Prior Level of Function : Independent/Modified Independent                        OT Problem List: Decreased activity tolerance;Impaired balance (sitting and/or standing);Cardiopulmonary status limiting activity;Decreased knowledge of use of DME or AE;Decreased knowledge of precautions;Decreased safety awareness      OT Treatment/Interventions: Self-care/ADL training;Neuromuscular education;Therapeutic exercise;Energy conservation;DME and/or AE instruction;Therapeutic activities;Balance training;Patient/family education    OT Goals(Current goals can be found in the care plan section) Acute Rehab OT Goals Patient Stated Goal: to get back to normal OT Goal Formulation: With patient/family Time For Goal Achievement: 09/08/21 Potential to Achieve Goals: Fair  OT Frequency: Min 2X/week    Co-evaluation              AM-PAC OT "6 Clicks" Daily Activity     Outcome Measure Help from another person eating meals?: A  Little Help from another person taking care of personal grooming?: A Little Help from another person toileting, which includes using toliet, bedpan, or urinal?: A Little Help from another person bathing (including washing, rinsing, drying)?: A Little Help from another person to put on and taking off regular upper body clothing?: A Little Help from another person to put on and taking off regular lower body clothing?: A Little 6 Click Score: 18   End of Session Equipment Utilized During Treatment: Gait belt;Rolling walker (2 wheels) Nurse Communication: Mobility status  Activity Tolerance: Patient tolerated treatment well Patient left: in bed;with call bell/phone within reach;with nursing/sitter in room;with family/visitor present  OT Visit Diagnosis: Unsteadiness on feet (R26.81);Other abnormalities of gait and mobility (R26.89);Muscle weakness (generalized) (M62.81)                Time: 7425-9563 OT Time Calculation (min): 23 min Charges:  OT General Charges $OT Visit: 1 Visit OT Evaluation $OT Eval Low Complexity: 1 Low OT Treatments $Self Care/Home Management : 8-22 mins  Jackelyn Poling OTR/L, MS Acute Rehabilitation Department Office# [phone] Pager# [phone]   [facility]  M Nabil Bubolz 08/25/2021, 12:09 PM

## 2021-08-25 NOTE — Progress Notes (Signed)
[facility] PROGRESS NOTE   Peter Brown WCB:[id number] DOB: [birth year] DOA: 08/24/2021  PCP: [name], MD  Brief History/Interval Summary: 81 y.o. male with medical history significant of Barrett's esophagus, lung cancer metastatic to brain, hyperlipidemia, psoriasis, precancerous melanosis, lumbosacral spondylosis, vitamin D deficiency, seasonal allergies who presented with generalized weakness and shortness of breath.  Found to have multifocal pneumonia.  COVID-19 PCR was negative.  Patient was hospitalized for further management.    Consultants: None  Procedures: None    Subjective/Interval History: Patient noted to be mildly confused and distracted.  Seems to be comfortable sitting on the bed.  Denies any chest pain.  Occasional cough.  No nausea or vomiting.    Assessment/Plan:  Multifocal pneumonia COVID-19 PCR was negative.  No pulmonary embolism noted on CT angiogram. Patient started on ceftriaxone and azithromycin.  Respiratory status noted to be stable.  Acute on questionable chronic respiratory failure with hypoxia Patient thinks that he uses oxygen at home.  This is not clear yet. Currently on nasal cannula 2 to 3 L saturating in the mid 90s.  Acute metabolic encephalopathy Noted to be mildly confused without any focal neurological deficits.  Could be due to pneumonia and his brain lesions. Continue to monitor for now.  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 Followed by Dr. Julien Nordmann with medical oncology. MRI of the brain shows stable findings.  Patient being continued on dexamethasone.  Barrett's esophagus/hiatal hernia Stable.  Normocytic anemia/thrombocytopenia No evidence of overt bleeding.  Monitor counts closely.  History of anxiety Continue duloxetine.  DVT Prophylaxis: SCDs Code Status: Full code Family Communication: No family at bedside Disposition  Plan: PT and OT evaluation.  Hopefully return home when improved  Status is: Inpatient Remains inpatient appropriate because: Multifocal pneumonia requiring IV antibiotics      Medications: Scheduled:  aspirin EC  81 mg Oral Daily   Chlorhexidine Gluconate Cloth  6 each Topical Daily   dexamethasone  2 mg Oral Daily   DULoxetine  30 mg Oral Daily   folic acid  1 mg Oral Daily   levothyroxine  25 mcg Oral QAC breakfast   magnesium oxide  400 mg Oral QPM   pantoprazole  40 mg Oral Daily   sodium chloride flush  10-40 mL Intracatheter Q12H   Continuous:  azithromycin     cefTRIAXone (ROCEPHIN)  IV     OHY:WVPXTGGYIRSWN **OR** acetaminophen, bisacodyl, ipratropium-albuterol, lidocaine-prilocaine, loratadine, ondansetron **OR** ondansetron (ZOFRAN) IV  Antibiotics: Anti-infectives (From admission, onward)    Start     Dose/Rate Route Frequency Ordered Stop   08/25/21 1400  cefTRIAXone (ROCEPHIN) 2 g in sodium chloride 0.9 % 100 mL IVPB        2 g 200 mL/hr over 30 Minutes Intravenous Every 24 hours 08/24/21 1825 08/29/21 1359   08/25/21 1400  azithromycin (ZITHROMAX) 500 mg in sodium chloride 0.9 % 250 mL IVPB        500 mg 250 mL/hr over 60 Minutes Intravenous Every 24 hours 08/24/21 1825 08/29/21 1359   08/24/21 1545  azithromycin (ZITHROMAX) 500 mg in sodium chloride 0.9 % 250 mL IVPB        500 mg 250 mL/hr over 60 Minutes Intravenous  Once 08/24/21 1544 08/24/21 1715   08/24/21 1545  cefTRIAXone (ROCEPHIN) 1 g in sodium chloride 0.9 % 100 mL IVPB        1 g 200 mL/hr over 30 Minutes Intravenous  Once 08/24/21 1544 08/24/21 1615       Objective:  Vital Signs  Vitals:   08/24/21 1840 08/24/21 2222 08/25/21 0241 08/25/21 0504  BP:  113/61 105/68 120/69  Pulse:  70 60 68  Resp:  17 16 17   Temp:  97.6 F (36.4 C) (!) 97.5 F (36.4 C) (!) 97.5 F (36.4 C)  TempSrc:      SpO2:  98% 95% 97%  Weight: 81.5 kg     Height: 6' (1.829 m)       Intake/Output Summary  (Last 24 hours) at 08/25/2021 1013 Last data filed at 08/25/2021 2119 Gross per 24 hour  Intake 340 ml  Output --  Net 340 ml   Filed Weights   08/24/21 1840  Weight: 81.5 kg    General appearance: Awake alert.  In no distress.  Mildly distracted Resp: Mildly tachypneic without use of accessory muscles.  Coarse breath sounds bilaterally with crackles bilaterally.  No wheezing or rhonchi. Cardio: S1-S2 is normal regular.  No S3-S4.  No rubs murmurs or bruit GI: Abdomen is soft.  Nontender nondistended.  Bowel sounds are present normal.  No masses organomegaly Extremities: No edema.  Full range of motion of lower extremities. Neurologic: No focal neurological deficits.    Lab Results:  Data Reviewed: I have personally reviewed following labs and reports of the imaging studies  CBC: Recent Labs  Lab 08/24/21 1152 08/25/21 0452  WBC 7.4 5.8  NEUTROABS 5.6  --   HGB 10.2* 9.3*  HCT 30.2* 27.8*  MCV 98.7 98.6  PLT 102* 108*    Basic Metabolic Panel: Recent Labs  Lab 08/24/21 1152 08/25/21 0452  NA 135 139  K 4.4 4.1  CL 105 109  CO2 23 26  GLUCOSE 158* 93  BUN 19 19  CREATININE 1.14 1.03  CALCIUM 9.5 8.9    GFR: Estimated Creatinine Clearance: 62.8 mL/min (by C-G formula based on SCr of 1.03 mg/dL).  Liver Function Tests: Recent Labs  Lab 08/24/21 1152 08/25/21 0452  AST 29 21  ALT 20 17  ALKPHOS 71 63  BILITOT <0.1* <0.1*  PROT 6.6 5.7*  ALBUMIN 3.3* 2.8*      Recent Results (from the past 240 hour(s))  SARS Coronavirus 2 by RT PCR (hospital order, performed in [facility] hospital lab) *cepheid single result test* Anterior Nasal Swab     Status: None   Collection Time: 08/24/21  1:24 PM   Specimen: Anterior Nasal Swab  Result Value Ref Range Status   SARS Coronavirus 2 by RT PCR NEGATIVE NEGATIVE Final    Comment: (NOTE) SARS-CoV-2 target nucleic acids are NOT DETECTED.  The SARS-CoV-2 RNA is generally detectable in upper and lower respiratory  specimens during the acute phase of infection. The lowest concentration of SARS-CoV-2 viral copies this assay can detect is 250 copies / mL. A negative result does not preclude SARS-CoV-2 infection and should not be used as the sole basis for treatment or other patient management decisions.  A negative result may occur with improper specimen collection / handling, submission of specimen other than nasopharyngeal swab, presence of viral mutation(s) within the areas targeted by this assay, and inadequate number of viral copies (<250 copies / mL). A negative result must be combined with clinical observations, patient history, and epidemiological information.  Fact Sheet for Patients:   https://www.patel.info/  Fact Sheet for Healthcare Providers: https://hall.com/  This test is not yet approved or  cleared by the [place] and  has been authorized for detection and/or diagnosis of SARS-CoV-2 by FDA under an Emergency Use Authorization (EUA).  This EUA will remain in effect (meaning this test can be used) for the duration of the COVID-19 declaration under Section 564(b)(1) of the Act, 21 U.S.C. section 360bbb-3(b)(1), unless the authorization is terminated or revoked sooner.  Performed at [facility], [place] [address], [place],  [address]       Radiology Studies: MR Brain W and Wo Contrast  Result Date: 08/25/2021 CLINICAL DATA:  Acute neurologic deficit. Intracranial metastatic disease. EXAM: MRI HEAD WITHOUT AND WITH CONTRAST TECHNIQUE: Multiplanar, multiecho pulse sequences of the brain and surrounding structures were obtained without and with intravenous contrast. CONTRAST:  81mL GADAVIST GADOBUTROL 1 MMOL/ML IV SOLN COMPARISON:  07/29/2021 FINDINGS: Brain: No acute infarct or hemorrhage. Unchanged appearance of anterior left temporal lesion, which measures 2.5 cm. Surrounding edema is also unchanged. The  degree of thickness of the peripheral contrast enhancement has decreased though this could be partially due to technical factors. Punctate lesion of the right paramedian cerebellum is unchanged (series 16, image 36). Punctate right occipital lesion (image 70) is unchanged. Vascular: Major flow voids are preserved. Skull and upper cervical spine: Normal calvarium and skull base. Visualized upper cervical spine and soft tissues are normal. Sinuses/Orbits:No paranasal sinus fluid levels or advanced mucosal thickening. No mastoid or middle ear effusion. Normal orbits. IMPRESSION: Unchanged size of 3 intracranial metastases with the dominant lesion the anterior left temporal. Electronically Signed   By: Ulyses Jarred M.D.   On: 08/25/2021 00:18   CT Angio Chest PE W and/or Wo Contrast  Result Date: 08/24/2021 CLINICAL DATA:  Shortness of breath, fever EXAM: CT ANGIOGRAPHY CHEST WITH CONTRAST TECHNIQUE: Multidetector CT imaging of the chest was performed using the standard protocol during bolus administration of intravenous contrast. Multiplanar CT image reconstructions and MIPs were obtained to evaluate the vascular anatomy. RADIATION DOSE REDUCTION: This exam was performed according to the departmental dose-optimization program which includes automated exposure control, adjustment of the mA and/or kV according to patient size and/or use of iterative reconstruction technique. CONTRAST:  96mL OMNIPAQUE IOHEXOL 350 MG/ML SOLN COMPARISON:  CT 07/16/2021 FINDINGS: Cardiovascular: Satisfactory opacification of the pulmonary arteries to the segmental level. No evidence of pulmonary embolism. Thoracic aorta is nonaneurysmal. Atherosclerotic calcification of the aorta and coronary arteries. Normal heart size. No pericardial effusion. Right-sided chest port in place with distal tip terminating at the level of the right atrium. Mediastinum/Nodes: No enlarged mediastinal, hilar, or axillary lymph nodes. Thyroid gland, trachea,  and esophagus demonstrate no significant findings. Lungs/Pleura: Extensive ground-glass opacities throughout both lungs with more consolidative opacity in the left perihilar region. 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 Known left upper lobe pulmonary nodule is not well assessed on the current study given adjacent consolidation. No new pulmonary nodules or masses are identified. No pleural effusion or pneumothorax. Upper Abdomen: No acute abnormality. Musculoskeletal: No new or acute osseous findings. No suspicious lytic or sclerotic bone lesions. Review of the MIP images confirms the above findings. IMPRESSION: 1. No evidence of acute pulmonary embolism. 2. Extensive ground-glass opacities throughout both lungs with more consolidative opacity in the left perihilar region. Findings are most suggestive of multifocal pneumonia. Radiographic follow-up after appropriate treatment is recommended to document resolution. 3. Known left upper lobe pulmonary nodule is not well assessed on the current study given adjacent consolidation. No new pulmonary nodules or masses are identified. Aortic Atherosclerosis (ICD10-I70.0).  Electronically Signed   By: Davina Poke D.O.   On: 08/24/2021 15:05   DG Chest 2 View  Result Date: 08/24/2021 CLINICAL DATA:  Fever, shortness of breath EXAM: CHEST - 2 VIEW COMPARISON:  Previous studies including CT done on 07/16/2021 and chest radiograph done on 07/21/2020 FINDINGS: Cardiac size is within normal limits. Tip of right IJ chest port is seen in superior vena cava. Alveolar densities are seen in left parahilar region. Increased interstitial markings are seen in both lower lung fields. There is no pleural effusion or pneumothorax. IMPRESSION: There is alveolar density overlying the left hilum and left perihilar region suggesting pneumonia or underlying neoplastic process. Increased interstitial markings in both lower lung fields suggest  scarring or interstitial pneumonia. Electronically Signed   By: Elmer Picker M.D.   On: 08/24/2021 13:18       LOS: 1 day   [facility] Pager on [email or web address]  08/25/2021, 10:13 AM

## 2021-08-26 ENCOUNTER — Encounter: Payer: Self-pay | Admitting: Internal Medicine

## 2021-08-26 ENCOUNTER — Other Ambulatory Visit: Payer: Self-pay | Admitting: Internal Medicine

## 2021-08-26 DIAGNOSIS — C3492 Malignant neoplasm of unspecified part of left bronchus or lung: Secondary | ICD-10-CM

## 2021-08-26 DIAGNOSIS — K2270 Barrett's esophagus without dysplasia: Secondary | ICD-10-CM

## 2021-08-26 DIAGNOSIS — E039 Hypothyroidism, unspecified: Secondary | ICD-10-CM | POA: Diagnosis not present

## 2021-08-26 DIAGNOSIS — F419 Anxiety disorder, unspecified: Secondary | ICD-10-CM

## 2021-08-26 DIAGNOSIS — J189 Pneumonia, unspecified organism: Secondary | ICD-10-CM | POA: Diagnosis not present

## 2021-08-26 LAB — CBC
HCT: 26.6 % — ABNORMAL LOW (ref 39.0–52.0)
Hemoglobin: 8.8 g/dL — ABNORMAL LOW (ref 13.0–17.0)
MCH: 32.8 pg (ref 26.0–34.0)
MCHC: 33.1 g/dL (ref 30.0–36.0)
MCV: 99.3 fL (ref 80.0–100.0)
Platelets: 106 10*3/uL — ABNORMAL LOW (ref 150–400)
RBC: 2.68 MIL/uL — ABNORMAL LOW (ref 4.22–5.81)
RDW: 13 % (ref 11.5–15.5)
WBC: 5.4 10*3/uL (ref 4.0–10.5)
nRBC: 0 % (ref 0.0–0.2)

## 2021-08-26 LAB — BASIC METABOLIC PANEL
Anion gap: 6 (ref 5–15)
BUN: 21 mg/dL (ref 8–23)
CO2: 26 mmol/L (ref 22–32)
Calcium: 9.1 mg/dL (ref 8.9–10.3)
Chloride: 107 mmol/L (ref 98–111)
Creatinine, Ser: 0.95 mg/dL (ref 0.61–1.24)
GFR, Estimated: >60 mL/min (ref >60)
Glucose, Bld: 108 mg/dL — ABNORMAL HIGH (ref 70–99)
Potassium: 3.8 mmol/L (ref 3.5–5.1)
Sodium: 139 mmol/L (ref 135–145)

## 2021-08-26 NOTE — Discharge Instructions (Signed)

## 2021-08-26 NOTE — Assessment & Plan Note (Signed)
Acute metabolic encephalopathy ruled out.   - Continue Cymbalta

## 2021-08-26 NOTE — Progress Notes (Signed)
Initial Nutrition Assessment  DOCUMENTATION CODES:   Not applicable  INTERVENTION:  - will order [employer] Instant breakfast with 2% on each breakfast tray, each supplement provides 260 kcal and 13 grams protein. - added High Calorie, High Protein handout to AVS.   NUTRITION DIAGNOSIS:   Increased nutrient needs related to acute illness, chronic illness, cancer and cancer related treatments as evidenced by estimated needs.  GOAL:   Patient will meet greater than or equal to 90% of their needs  MONITOR:   PO intake, Supplement acceptance, Labs, Weight trends  REASON FOR ASSESSMENT:   Malnutrition Screening Tool  ASSESSMENT:   81 y.o. male with medical history of Barrett's esophagus, lung cancer metastatic to brain, HLD, psoriasis, pre-cancerous melanosis, lumbosacral spondylosis, and vitamin D deficiency. He presented to the ED due to generalized weakness and shortness of breath and was found to have multifocal PNA.  Patient sitting up in the chair with his wife at bedside. Flow sheet documentation indicates he ate 100% of breakfast and 100% of lunch yesterday (total of 1138 kcal and 50 grams protein) and 95% of breakfast today (690 kcal and 16 grams protein).  Patient was assessed by SLP yesterday; reviewed this note in detail.   Patient and wife share that patient's appetite has mainly been good and unchanged PTA. His wife prepares meals for the them and incorporates vegetables and meats often. If he is not eating as well he will drink a [employer] that his wife "doctors up"; this is an intermittent thing. Encouraged patient to drink this daily as he outlines progressive weakness and unsteadiness with ambulation. He does not care for Ensure or similar.  Patient has a cane at home and walked with PT prior to RD visit. His wife shares that he may need more support than a cane. He used to walk 45 minutes/day and now has difficulty with only a few minutes at a time.    Weight on 8/21 was 180 lb, weight on 7/18 was 181 lb, weight on 6/26 was 182 lb, and weight on 5/16 was 184 lb. Patient reports he did have BLE edema which has now resolved; no edema present during NFPE.   Labs reviewed. Medications reviewed; 1 mg folvite/day, 25 mcg oral synthroid/day, 400 mg mag-ox/day, 40 mg oral protonix/day.    NUTRITION - FOCUSED PHYSICAL EXAM:  Flowsheet Row Most Recent Value  Orbital Region No depletion  Upper Arm Region No depletion  Thoracic and Lumbar Region Unable to assess  Buccal Region No depletion  Temple Region No depletion  Clavicle Bone Region No depletion  Clavicle and Acromion Bone Region Mild depletion  Scapular Bone Region No depletion  Dorsal Hand No depletion  Patellar Region Mild depletion  Anterior Thigh Region Mild depletion  Posterior Calf Region Mild depletion  Edema (RD Assessment) None  Hair Reviewed  Eyes Reviewed  Mouth Reviewed  Skin Reviewed  Nails Reviewed       Diet Order:   Diet Order             Diet Heart Room service appropriate? Yes; Fluid consistency: Thin  Diet effective now                   EDUCATION NEEDS:   Education needs have been addressed  Skin:  Skin Assessment: Reviewed RN Assessment  Last BM:  PTA/unknown  Height:   Ht Readings from Last 1 Encounters:  08/24/21 6' (1.829 m)    Weight:   Wt Readings from  Last 1 Encounters:  08/24/21 81.5 kg    BMI:  Body mass index is 24.37 kg/m.  Estimated Nutritional Needs:  Kcal:  2050-2250 kcal Protein:  102-115 grams Fluid:  >/= 2.2 L/day     Jarome Matin, MS, RD, LDN, CNSC Registered Dietitian II Inpatient Clinical Nutrition RD pager # and on-call/weekend pager # available in [facility]

## 2021-08-26 NOTE — Evaluation (Signed)
Physical Therapy Evaluation Patient Details Name: Peter Brown MRN: [id number] DOB: January 01, 1941 Today's Date: 08/26/2021  History of Present Illness  81 y.o. male with medical history significant of Barrett's esophagus, lung cancer metastatic to brain, hyperlipidemia, psoriasis, precancerous melanosis, lumbosacral spondylosis, vitamin D deficiency, seasonal allergies who presented with generalized weakness and shortness of breath.  Found to have multifocal pneumonia.  Clinical Impression  Pt admitted with above diagnosis. Pt ambulated 51' with RW, no loss of balance. SaO2 96% on room air while walking but then dropped to 83% on room air immediately after walking. 3L Supplemental O2 applied, after ~4 minutes SaO2 up to 94%. 92-95% on room air at rest. At present, pt needs supplemental O2 for activity.  Pt currently with functional limitations due to the deficits listed below (see PT Problem List). Pt will benefit from skilled PT to increase their independence and safety with mobility to allow discharge to the venue listed below.          Recommendations for follow up therapy are one component of a multi-disciplinary discharge planning process, led by the attending physician.  Recommendations may be updated based on patient status, additional functional criteria and insurance authorization.  Follow Up Recommendations Home health PT      Assistance Recommended at Discharge Set up Supervision/Assistance  Patient can return home with the following  A little help with bathing/dressing/bathroom;Assistance with cooking/housework;Help with stairs or ramp for entrance    Equipment Recommendations None recommended by PT  Recommendations for Other Services       Functional Status Assessment Patient has had a recent decline in their functional status and demonstrates the ability to make significant improvements in function in a reasonable and predictable amount of time.     Precautions / Restrictions  Precautions Precaution Comments: monitor O2; wife denies falls in past 6 months Restrictions Weight Bearing Restrictions: No      Mobility  Bed Mobility Overal bed mobility: Modified Independent             General bed mobility comments: HOB up    Transfers Overall transfer level: Needs assistance Equipment used: Rolling walker (2 wheels) Transfers: Sit to/from Stand Sit to Stand: Min guard           General transfer comment: VCs hand placement    Ambulation/Gait Ambulation/Gait assistance: Supervision Gait Distance (Feet): 90 Feet Assistive device: Rolling walker (2 wheels) Gait Pattern/deviations: WFL(Within Functional Limits) Gait velocity: WFL     General Gait Details: SaO2 96% on room air while walking but then dropped to 83% on room air during seated rest immediately after walking; applied 3L O2, pt took ~4 minutes to recover to 94%;  92-95% on room air at rest prior to activity. NO loss of balance while walking. VCs for pursed lip breathing  Stairs            Wheelchair Mobility    Modified Rankin (Stroke Patients Only)       Balance Overall balance assessment: Modified Independent                                           Pertinent Vitals/Pain Pain Assessment Pain Assessment: No/denies pain    Home Living Family/patient expects to be discharged to:: Private residence Living Arrangements: Spouse/significant other Available Help at Discharge: Family;Available 24 hours/day   Home Access: Stairs to enter   [employer] of  Steps: 1   Home Layout: One level Home Equipment: Rollator (4 wheels);Cane - single point;Shower seat      Prior Function Prior Level of Function : Independent/Modified Independent             Mobility Comments: walks with SPC       Hand Dominance   Dominant Hand: Right    Extremity/Trunk Assessment   Upper Extremity Assessment Upper Extremity Assessment: Defer to OT  evaluation    Lower Extremity Assessment Lower Extremity Assessment: Overall WFL for tasks assessed    Cervical / Trunk Assessment Cervical / Trunk Assessment: Normal  Communication   Communication: No difficulties  Cognition Arousal/Alertness: Awake/alert Behavior During Therapy: WFL for tasks assessed/performed Overall Cognitive Status: Impaired/Different from baseline Area of Impairment: Memory                               General Comments: wife present in room, she provided PLOF as pt seems to have some memory deficits        General Comments      Exercises     Assessment/Plan    PT Assessment Patient needs continued PT services  PT Problem List Decreased activity tolerance;Cardiopulmonary status limiting activity       PT Treatment Interventions Gait training;Therapeutic exercise;Patient/family education;Therapeutic activities    PT Goals (Current goals can be found in the Care Plan section)  Acute Rehab PT Goals Patient Stated Goal: to walk farther without SOB PT Goal Formulation: With patient/family Time For Goal Achievement: 09/09/21    Frequency Min 3X/week     Co-evaluation               AM-PAC PT "6 Clicks" Mobility  Outcome Measure Help needed turning from your back to your side while in a flat bed without using bedrails?: None Help needed moving from lying on your back to sitting on the side of a flat bed without using bedrails?: None Help needed moving to and from a bed to a chair (including a wheelchair)?: None Help needed standing up from a chair using your arms (e.g., wheelchair or bedside chair)?: None Help needed to walk in hospital room?: None Help needed climbing 3-5 steps with a railing? : A Little 6 Click Score: 23    End of Session Equipment Utilized During Treatment: Gait belt;Oxygen Activity Tolerance: Patient tolerated treatment well Patient left: in chair;with call bell/phone within reach;with family/visitor  present Nurse Communication: Mobility status PT Visit Diagnosis: Difficulty in walking, not elsewhere classified (R26.2)    Time: 8088-1103 PT Time Calculation (min) (ACUTE ONLY): 26 min   Charges:   PT Evaluation $PT Eval Moderate Complexity: 1 Mod PT Treatments $Gait Training: 8-22 mins        Blondell Reveal Kistler PT 08/26/2021  Acute Rehabilitation Services  Office [phone]

## 2021-08-26 NOTE — Assessment & Plan Note (Signed)
Continue PPI ?

## 2021-08-26 NOTE — Hospital Course (Signed)
81 y.o. male with medical history significant of Barrett's esophagus, lung cancer metastatic to brain, hyperlipidemia, psoriasis, precancerous melanosis, lumbosacral spondylosis, vitamin D deficiency, seasonal allergies who presented with generalized weakness and shortness of breath.  Found to have multifocal pneumonia.  COVID-19 PCR was negative.  Patient was hospitalized for further management.

## 2021-08-26 NOTE — Assessment & Plan Note (Signed)
-   Continue Decadron

## 2021-08-26 NOTE — Assessment & Plan Note (Signed)
Still requiring supplemental O2 today.  Respiratory failure ruled out, he has mild hypoxia only. - Continue antibiotics - Continue chest PT

## 2021-08-26 NOTE — Assessment & Plan Note (Signed)
Continue levothyroxine 

## 2021-08-26 NOTE — Progress Notes (Signed)
SATURATION QUALIFICATIONS: (This note is used to comply with regulatory documentation for home oxygen)  Patient Saturations on Room Air at Rest = 95%  Patient Saturations on Room Air while Ambulating = 83%  Patient Saturations on TBD Liters of oxygen while Ambulating = ---%  Please briefly explain why patient needs home oxygen: to maintain SaO2 at appropriate levels.   Blondell Reveal Kistler PT 08/26/2021  Acute Rehabilitation Services  Office [phone]

## 2021-08-26 NOTE — Assessment & Plan Note (Signed)
-   FOllow up with oncology

## 2021-08-26 NOTE — Progress Notes (Signed)
  Progress Note   Patient: Peter Brown LEX:[id number] DOB: November 20, 1940 DOA: 08/24/2021     2 DOS: the patient was seen and examined on 08/26/2021 at 12:20PM      Brief hospital course: 81 y.o. male with medical history significant of Barrett's esophagus, lung cancer metastatic to brain, hyperlipidemia, psoriasis, precancerous melanosis, lumbosacral spondylosis, vitamin D deficiency, seasonal allergies who presented with generalized weakness and shortness of breath.  Found to have multifocal pneumonia.  COVID-19 PCR was negative.  Patient was hospitalized for further management.         Assessment and Plan: * Multifocal pneumonia Still requiring supplemental O2 today.  Respiratory failure ruled out, he has mild hypoxia only. - Continue antibiotics - Continue chest PT  Acquired hypothyroidism - Continue levothyroxine  Mild protein malnutrition (HCC)    Normocytic anemia Hgb stable  Hiatal hernia - Continue PPI  Barrett's esophagus without dysplasia - Continue PPI  Anxiety Acute metabolic encephalopathy ruled out.   - Continue Cymbalta  Malignant neoplasm metastatic to brain ([place]) - Continue Decadron  Adenocarcinoma, lung, left ([place]) - FOllow up with oncology          Subjective: Still dyspneic and coughing.  No confsion, no fever.      Physical Exam: Vitals:   08/25/21 2054 08/26/21 0523 08/26/21 0934 08/26/21 1228  BP: 104/63 105/69  110/67  Pulse: 68 62  82  Resp: 16 16  (!) 23  Temp: (!) 97.4 F (36.3 C) 97.7 F (36.5 C)  98.4 F (36.9 C)  TempSrc:    Oral  SpO2: 95% 96% 96% 97%  Weight:      Height:       Thin elderly male, lying in bed, interactive, no acute distress RRR, no murmurs, no peripheral edema Respiratory rate normal, crackles at bilateral bases, no wheezing Abdomen soft without tenderness palpation or guarding, no ascites or distention Attention normal, affect appropriate, judgment and insight appear normal, face symmetric,  speech fluent    Data Reviewed: Hemoglobin down to 8.8 and platelets 106K, no change from previous CT angiogram of the chest shows no PE, does show multifocal pneumonia MRI brain shows known metastasis  Family Communication: Wife at the bedside    Disposition: Status is: Inpatient The patient presented with multifocal pneumonia in setting of age 81, lung cancer, requiring supplemental oxygen  He still requires a little bit of oxygen, but is improving, and I suspect by tomorrow he will be weaned off of oxygen and able to go home          Author: Edwin Dada, MD 08/26/2021 9:12 PM  For on call review [email or web address].

## 2021-08-26 NOTE — Assessment & Plan Note (Signed)
Hgb stable 

## 2021-08-27 ENCOUNTER — Encounter: Payer: Self-pay | Admitting: Internal Medicine

## 2021-08-27 ENCOUNTER — Other Ambulatory Visit (HOSPITAL_COMMUNITY): Payer: Self-pay

## 2021-08-27 DIAGNOSIS — J189 Pneumonia, unspecified organism: Secondary | ICD-10-CM | POA: Diagnosis not present

## 2021-08-27 MED ORDER — ORAL CARE MOUTH RINSE
15.0000 mL | OROMUCOSAL | Status: DC | PRN
Start: 2021-08-27 — End: 2021-08-27

## 2021-08-27 MED ORDER — AZITHROMYCIN 250 MG PO TABS
250.0000 mg | ORAL_TABLET | Freq: Every day | ORAL | 0 refills | Status: AC
  Filled 2021-08-27: qty 3, 3d supply, fill #0

## 2021-08-27 MED ORDER — AMOXICILLIN-POT CLAVULANATE 875-125 MG PO TABS
1.0000 | ORAL_TABLET | Freq: Two times a day (BID) | ORAL | 0 refills | Status: AC
  Filled 2021-08-27: qty 12, 6d supply, fill #0

## 2021-08-27 MED ORDER — HEPARIN SOD (PORK) LOCK FLUSH 100 UNIT/ML IV SOLN
500.0000 [IU] | INTRAVENOUS | Status: AC | PRN
  Administered 2021-08-27: 500 [IU]
  Filled 2021-08-27: qty 5

## 2021-08-27 NOTE — Plan of Care (Signed)
SATURATION QUALIFICATIONS: (This note is used to comply with regulatory documentation for home oxygen)  Patient Saturations on Room Air at Rest = 92%  Patient Saturations on Room Air while Ambulating = 84%  Patient Saturations on 3 Liters of oxygen while Ambulating = 94%  Please briefly explain why patient needs home oxygen: Pt unable to maintain oxygen saturation within acceptable range while ambulating. As well subjectively pt states exertion with oxygen is better than without   Problem: Education: Goal: Knowledge of General Education information will improve Description: Including pain rating scale, medication(s)/side effects and non-pharmacologic comfort measures Outcome: Progressing   Problem: Health Behavior/Discharge Planning: Goal: Ability to manage health-related needs will improve Outcome: Progressing   Problem: Clinical Measurements: Goal: Ability to maintain clinical measurements within normal limits will improve Outcome: Progressing Goal: Will remain free from infection Outcome: Progressing Goal: Diagnostic test results will improve Outcome: Progressing Goal: Respiratory complications will improve Outcome: Progressing Goal: Cardiovascular complication will be avoided Outcome: Progressing   Problem: Activity: Goal: Risk for activity intolerance will decrease Outcome: Progressing   Problem: Nutrition: Goal: Adequate nutrition will be maintained Outcome: Progressing   Problem: Coping: Goal: Level of anxiety will decrease Outcome: Progressing   Problem: Elimination: Goal: Will not experience complications related to bowel motility Outcome: Progressing Goal: Will not experience complications related to urinary retention Outcome: Progressing   Problem: Pain Managment: Goal: General experience of comfort will improve Outcome: Progressing   Problem: Safety: Goal: Ability to remain free from injury will improve Outcome: Progressing   Problem: Skin  Integrity: Goal: Risk for impaired skin integrity will decrease Outcome: Progressing   Problem: Activity: Goal: Ability to tolerate increased activity will improve Outcome: Progressing   Problem: Clinical Measurements: Goal: Ability to maintain a body temperature in the normal range will improve Outcome: Progressing   Problem: Respiratory: Goal: Ability to maintain adequate ventilation will improve Outcome: Progressing Goal: Ability to maintain a clear airway will improve Outcome: Progressing

## 2021-08-27 NOTE — Plan of Care (Signed)
  Problem: Education: Goal: Knowledge of General Education information will improve Description: Including pain rating scale, medication(s)/side effects and non-pharmacologic comfort measures Outcome: Progressing   Problem: Activity: Goal: Risk for activity intolerance will decrease Outcome: Progressing   Problem: Coping: Goal: Level of anxiety will decrease Outcome: Progressing   Problem: Elimination: Goal: Will not experience complications related to urinary retention Outcome: Progressing   Problem: Pain Managment: Goal: General experience of comfort will improve Outcome: Progressing   Problem: Safety: Goal: Ability to remain free from injury will improve Outcome: Progressing   Problem: Skin Integrity: Goal: Risk for impaired skin integrity will decrease Outcome: Progressing   Problem: Activity: Goal: Ability to tolerate increased activity will improve Outcome: Progressing   Problem: Respiratory: Goal: Ability to maintain a clear airway will improve Outcome: Progressing

## 2021-08-27 NOTE — TOC Transition Note (Addendum)
Transition of Care [facility]) - CM/SW Discharge Note   Patient Details  Name: Peter Brown MRN: [id number] Date of Birth: 1940-12-20  Transition of [facility] CM/SW Contact:  Roseanne Kaufman, RN Phone Number: 08/27/2021, 10:06 AM   Clinical Narrative:   Patient has home oxygen with [employer], per Salena Saner will deliver a portable tank to patient's room prior to d/c. MD notified to include continuous oxygen to bleed into CPAP within oxygen order. No additional TOC needs at this time.      Barriers to Discharge: Continued Medical Work up   Patient Goals and CMS Choice Patient states their goals for this hospitalization and ongoing recovery are:: return home to feel better      Discharge Placement     Home with home continuous oxygen                  Discharge Plan and Services   Discharge Planning Services: CM Consult                                 Social Determinants of Health (SDOH) Interventions     Readmission Risk Interventions     No data to display

## 2021-08-27 NOTE — Discharge Summary (Signed)
Physician Discharge Summary   Patient: Peter Brown MRN: [id number] DOB: [birth year]  Admit date:     08/24/2021  Discharge date: 08/27/21  Discharge Physician: Peter Brown   PCP: Peter Hong, MD     Recommendations at discharge:  Follow up with Dr. 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 M with lung cancer metastatic to the brain who presented with generalized weakness, shortness of breath.    CTA chest showed multifocal opacities.       * Multifocal pneumonia Admitted and started on Rocephin and azithromycin.  Mentating at baseline, taking orals.  Temp < 100 F, heart rate < 100bpm, RR < 24.   Stable for discharge.     He desaturated with ambulation, and I suspect this will continue for some weeks, possibly permanently, in the setting of his cancer, underlying lung fibrosis.  Discharged to complete 6 more days Augmentin, rest of course of azithromycin and with O2.             Diet recommendation:  Discharge Diet Orders (From admission, onward)     Start     Ordered   08/27/21 0000  Diet - low sodium heart healthy        08/27/21 1030             DISCHARGE MEDICATION: Allergies as of 08/27/2021   No Known Allergies      Medication List     STOP taking these medications    ondansetron 4 MG disintegrating tablet Commonly known as: ZOFRAN-ODT   prochlorperazine 10 MG tablet Commonly known as: COMPAZINE   triamcinolone cream 0.1 % Commonly known as: KENALOG       TAKE these medications     acetaminophen 500 MG tablet Commonly known as: TYLENOL Take 500 mg by mouth every 6 (six) hours as needed for headache.   albuterol 108 (90 Base) MCG/ACT inhaler Commonly known as: VENTOLIN HFA Inhale 2 puffs into the lungs every 4 (four) hours as needed for wheezing or shortness of breath.   amoxicillin-clavulanate 875-125 MG tablet Commonly known as: AUGMENTIN Take 1 tablet by mouth 2 (two) times daily for 6 days.   aspirin EC 81 MG tablet Take 81 mg by mouth daily.   azithromycin 250 MG tablet Commonly known as: Zithromax Take 1 tablet (250 mg total) by mouth daily for 3 days.   bisacodyl 5 MG EC tablet Commonly known as: DULCOLAX Take 5 mg by mouth 3 (three) times daily as needed for moderate constipation.   coal tar-salicylic acid 2 % shampoo Apply topically daily as needed for itching.   D3 PO Take 1 tablet by mouth daily.   dexamethasone 2 MG tablet Commonly known as: DECADRON Take 2 tablets (4 mg total) by mouth daily for 7 days, THEN 1 tablet (2 mg total) daily. Start taking on: August 04, 2021   DULoxetine 30 MG capsule Commonly known as: CYMBALTA Take 30 mg by mouth daily.   fexofenadine 180 MG tablet Commonly known as: ALLEGRA Take  180 mg by mouth daily as needed for allergies.   fluocinonide cream 0.05 % Commonly known as: LIDEX Apply 1 application  topically 2 (two) times daily as needed (psroasis).   folic acid 1 MG tablet Commonly known as: FOLVITE TAKE 1 TABLET(1 MG) BY MOUTH DAILY   hydrocortisone cream 1 % Apply 1 application topically 2 (two) times daily. What changed:  when to take this reasons to take this   ketoconazole 2 % cream Commonly known as: NIZORAL Apply 1 application  topically daily as needed (rash).   levothyroxine 25 MCG tablet Commonly known as: SYNTHROID Take 1 tablet (25 mcg total) by mouth daily before breakfast.   lidocaine-prilocaine cream Commonly known as: EMLA Apply 1 application topically as  needed. What changed:  when to take this reasons to take this   Magnesium 500 MG Tabs Take 1 tablet by mouth every evening.   Omega-3 500 MG Caps Take 1 capsule by mouth daily.   omeprazole 40 MG capsule Commonly known as: PRILOSEC Take 40 mg by mouth every evening.   Probiotic 1-250 BILLION-MG Caps Take 1 tablet by mouth daily.   VITAMIN B-3 PO Take 1 tablet by mouth daily.         Discharge Instructions     Diet - low sodium heart healthy   Complete by: As directed    Discharge instructions   Complete by: As directed    From Dr. Loleta Brown: You were admitted with pneumonia You were treated here with ceftriaxone and azithromycin and you should continue treatment with 6 more days antibiotics (Augmentin for 6 more days and azithromycin for 3 more days)  Take Augmentin 875-125 mg twice daily starting at noon today when you pick the medicine up and again tonight around 8p before bed (then twice daily tomorrow 12 hours apart)  Take azithromycin 250 mg once daily for three more days, starting today when you pick it up Azithyomycin is long lasting, so that's why you take it a shorter period  Use the flutter valve and incentive spirometer  For cough, take an over-the-counter Robitussin that includes dextromethorphan and guaifenesin (avoid cough syrups with phenylephrine in them)  Use the oxygen all the time to keep your oxygen level between 88 and 94% It is okay if the oxygen level goes up over 94%, but if the oxygen level drops below 88% and stays there, call your primary care doctor's office  Also if the oxygen level drops weight below 88%, or stays below 88% and you appear to be feeling out of breath, seek care immediately   Increase activity slowly   Complete by: As directed        Discharge Exam: Filed Weights   08/24/21 1840  Weight: 81.5 kg    General: Pt is alert, awake, not in acute distress, lying in bed, interactive Cardiovascular: RRR, nl S1-S2, no  murmurs appreciated.   No LE edema.   Respiratory: Normal respiratory rate and rhythm.  Fine crackles in bilateral bases. Abdominal: Abdomen soft and non-tender.  No distension or HSM.   Neuro/Psych: Strength symmetric in upper and lower extremities.  Judgment and insight appear normal.   Condition at discharge: stable  The results of significant diagnostics from this hospitalization (including imaging, microbiology, ancillary and laboratory) are listed below for reference.   Imaging Studies: MR Brain W and Wo Contrast  Result Date: 08/25/2021 CLINICAL DATA:  Acute neurologic deficit. Intracranial metastatic disease. EXAM: MRI HEAD WITHOUT AND WITH CONTRAST TECHNIQUE: Multiplanar, multiecho pulse  sequences of the brain and surrounding structures were obtained without and with intravenous contrast. CONTRAST:  33mL GADAVIST GADOBUTROL 1 MMOL/ML IV SOLN COMPARISON:  07/29/2021 FINDINGS: Brain: No acute infarct or hemorrhage. Unchanged appearance of anterior left temporal lesion, which measures 2.5 cm. Surrounding edema is also unchanged. The degree of thickness of the peripheral contrast enhancement has decreased though this could be partially due to technical factors. Punctate lesion of the right paramedian cerebellum is unchanged (series 16, image 36). Punctate right occipital lesion (image 70) is unchanged. Vascular: Major flow voids are preserved. Skull and upper cervical spine: Normal calvarium and skull base. Visualized upper cervical spine and soft tissues are normal. Sinuses/Orbits:No paranasal sinus fluid levels or advanced mucosal thickening. No mastoid or middle ear effusion. Normal orbits. IMPRESSION: Unchanged size of 3 intracranial metastases with the dominant lesion the anterior left temporal. Electronically Signed   By: Ulyses Jarred M.D.   On: 08/25/2021 00:18   CT Angio Chest PE W and/or Wo Contrast  Result Date: 08/24/2021 CLINICAL DATA:  Shortness of breath, fever EXAM: CT  ANGIOGRAPHY CHEST WITH CONTRAST TECHNIQUE: Multidetector CT imaging of the chest was performed using the standard protocol during bolus administration of intravenous contrast. Multiplanar CT image reconstructions and MIPs were obtained to evaluate the vascular anatomy. RADIATION DOSE REDUCTION: This exam was performed according to the departmental dose-optimization program which includes automated exposure control, adjustment of the mA and/or kV according to patient size and/or use of iterative reconstruction technique. CONTRAST:  52mL OMNIPAQUE IOHEXOL 350 MG/ML SOLN COMPARISON:  CT 07/16/2021 FINDINGS: Cardiovascular: Satisfactory opacification of the pulmonary arteries to the segmental level. No evidence of pulmonary embolism. Thoracic aorta is nonaneurysmal. Atherosclerotic calcification of the aorta and coronary arteries. Normal heart size. No pericardial effusion. Right-sided chest port in place with distal tip terminating at the level of the right atrium. Mediastinum/Nodes: No enlarged mediastinal, hilar, or axillary lymph nodes. Thyroid gland, trachea, and esophagus demonstrate no significant findings. Lungs/Pleura: Extensive ground-glass opacities throughout both lungs with more consolidative opacity in the left perihilar region. 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 Known left upper lobe pulmonary nodule is not well assessed on the current study given adjacent consolidation. No new pulmonary nodules or masses are identified. No pleural effusion or pneumothorax. Upper Abdomen: No acute abnormality. Musculoskeletal: No new or acute osseous findings. No suspicious lytic or sclerotic bone lesions. Review of the MIP images confirms the above findings. IMPRESSION: 1. No evidence of acute pulmonary embolism. 2. Extensive ground-glass opacities throughout both lungs with more consolidative opacity in the left perihilar region. Findings are most suggestive of  multifocal pneumonia. Radiographic follow-up after appropriate treatment is recommended to document resolution. 3. Known left upper lobe pulmonary nodule is not well assessed on the current study given adjacent consolidation. No new pulmonary nodules or masses are identified. Aortic Atherosclerosis (ICD10-I70.0). Electronically Signed   By: Davina Poke D.O.   On: 08/24/2021 15:05   DG Chest 2 View  Result Date: 08/24/2021 CLINICAL DATA:  Fever, shortness of breath EXAM: CHEST - 2 VIEW COMPARISON:  Previous studies including CT done on 07/16/2021 and chest radiograph done on 07/21/2020 FINDINGS: Cardiac size is within normal limits. Tip of right IJ chest port is seen in superior vena cava. Alveolar densities are seen in left parahilar region. Increased interstitial markings are seen in both lower lung fields. There is no pleural effusion or pneumothorax. IMPRESSION: There is alveolar density overlying the left hilum and left perihilar  region suggesting pneumonia or underlying neoplastic process. Increased interstitial markings in both lower lung fields suggest scarring or interstitial pneumonia. Electronically Signed   By: Elmer Picker M.D.   On: 08/24/2021 13:18   MR BRAIN W WO CONTRAST  Result Date: 07/29/2021 CLINICAL DATA:  Brain/CNS neoplasm, assess treatment response. Metastatic lung cancer. Prior SRS to a left temporal lesion. EXAM: MRI HEAD WITHOUT AND WITH CONTRAST TECHNIQUE: Multiplanar, multiecho pulse sequences of the brain and surrounding structures were obtained without and with intravenous contrast. CONTRAST:  21mL MULTIHANCE GADOBENATE DIMEGLUMINE 529 MG/ML IV SOLN COMPARISON:  Head MRI 06/12/2021 FINDINGS: Brain: New lesions: None. Larger lesions: 1. 4 mm enhancing lesion in the cerebellar vermis which in retrospect was subtly visible on the prior study but has increased in size (series 11, image 43). No edema. 2. 3 mm enhancing lesion in the medial right occipital lobe, also  subtly visible on the prior study but with interval increase in size (series 11, image 70). No edema. 3. Increased size of the previously treated, centrally necrotic left temporal lobe lesion which now measures 3.1 x 2.6 cm with greatly increased, extensive surrounding vasogenic edema. Stable or smaller lesions: None. Other brain findings: Left temporal lobe edema results in sulcal effacement and effacement of the temporal horn of the left lateral ventricle. There is at most trace rightward midline shift. There is no evidence of hydrocephalus, acute infarct, acute intracranial hemorrhage, or extra-axial fluid collection. There is mild cerebral atrophy. Vascular: Major intracranial vascular flow voids are preserved. Skull and upper cervical spine: No suspicious marrow lesion. Sinuses/Orbits: Unremarkable orbits. No significant inflammatory disease in the paranasal sinuses. Trace left mastoid effusion. Other: None. IMPRESSION: 1. 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 2. Increased size of 2 previously unreported lesions measuring 3-4 mm in the right occipital lobe and cerebellar vermis consistent with metastases. No edema. Electronically Signed   By: Logan Bores M.D.   On: 07/29/2021 14:34    Microbiology: Results for orders placed or performed during the hospital encounter of 08/24/21  SARS Coronavirus 2 by RT PCR (hospital order, performed in [facility] hospital lab) *cepheid single result test* Anterior Nasal Swab     Status: None   Collection Time: 08/24/21  1:24 PM   Specimen: Anterior Nasal Swab  Result Value Ref Range Status   SARS Coronavirus 2 by RT PCR NEGATIVE NEGATIVE Final    Comment: (NOTE) SARS-CoV-2 target nucleic acids are NOT DETECTED.  The SARS-CoV-2 RNA is generally detectable in upper and lower respiratory specimens during the acute phase of infection. The  lowest concentration of SARS-CoV-2 viral copies this assay can detect is 250 copies / mL. A negative result does not preclude SARS-CoV-2 infection and should not be used as the sole basis for treatment or other patient management decisions.  A negative result may occur with improper specimen collection / handling, submission of specimen other than nasopharyngeal swab, presence of viral mutation(s) within the areas targeted by this assay, and inadequate number of viral copies (<250 copies / mL). A negative result must be combined with clinical observations, patient history, and epidemiological information.  Fact Sheet for Patients:   https://www.patel.info/  Fact Sheet for Healthcare Providers: https://hall.com/  This test is not yet approved or  cleared by the [place] FDA and has been authorized for detection and/or diagnosis of SARS-CoV-2 by FDA under an Emergency Use Authorization (EUA).  This  EUA will remain in effect (meaning this test can be used) for the duration of the COVID-19 declaration under Section 564(b)(1) of the Act, 21 U.S.C. section 360bbb-3(b)(1), unless the authorization is terminated or revoked sooner.  Performed at [facility], [place] [address] [place], [place] [address]     Labs: CBC: Recent Labs  Lab 08/24/21 1152 08/25/21 0452 08/26/21 0400  WBC 7.4 5.8 5.4  NEUTROABS 5.6  --   --   HGB 10.2* 9.3* 8.8*  HCT 30.2* 27.8* 26.6*  MCV 98.7 98.6 99.3  PLT 102* 108* 062*   Basic Metabolic Panel: Recent Labs  Lab 08/24/21 1152 08/25/21 0452 08/26/21 0400  NA 135 139 139  K 4.4 4.1 3.8  CL 105 109 107  CO2 23 26 26   GLUCOSE 158* 93 108*  BUN 19 19 21   CREATININE 1.14 1.03 0.95  CALCIUM 9.5 8.9 9.1   Liver Function Tests: Recent Labs  Lab 08/24/21 1152 08/25/21 0452  AST 29 21  ALT 20 17  ALKPHOS 71 63  BILITOT <0.1* <0.1*  PROT 6.6 5.7*  ALBUMIN 3.3* 2.8*   CBG: No  results for input(s): "GLUCAP" in the last 168 hours.  Discharge time spent: approximately 35 minutes spent on discharge counseling, evaluation of patient on day of discharge, and coordination of discharge planning with nursing, social work, pharmacy and case management  Signed: Edwin Dada, MD [facility] 08/27/2021

## 2021-08-28 ENCOUNTER — Telehealth: Payer: Self-pay | Admitting: Internal Medicine

## 2021-08-28 NOTE — Telephone Encounter (Signed)
Scheduled per work-queue, patient has been called and voicemail was left regarding upcoming appointments.

## 2021-08-29 ENCOUNTER — Other Ambulatory Visit: Payer: Self-pay

## 2021-08-31 ENCOUNTER — Ambulatory Visit: Payer: Medicare HMO | Admitting: Physician Assistant

## 2021-08-31 ENCOUNTER — Other Ambulatory Visit: Payer: Medicare HMO

## 2021-08-31 ENCOUNTER — Ambulatory Visit: Payer: Medicare HMO

## 2021-09-03 ENCOUNTER — Other Ambulatory Visit: Payer: Self-pay

## 2021-09-03 ENCOUNTER — Inpatient Hospital Stay: Payer: Medicare HMO

## 2021-09-03 ENCOUNTER — Ambulatory Visit: Payer: Medicare HMO

## 2021-09-03 ENCOUNTER — Other Ambulatory Visit: Payer: Medicare HMO

## 2021-09-03 ENCOUNTER — Ambulatory Visit: Payer: Medicare HMO | Admitting: Internal Medicine

## 2021-09-03 ENCOUNTER — Encounter: Payer: Self-pay | Admitting: Internal Medicine

## 2021-09-03 ENCOUNTER — Inpatient Hospital Stay: Payer: Medicare HMO | Admitting: Internal Medicine

## 2021-09-03 VITALS — BP 93/56 | HR 72 | Temp 98.0°F | Resp 16 | Wt 173.8 lb

## 2021-09-03 DIAGNOSIS — Z79899 Other long term (current) drug therapy: Secondary | ICD-10-CM | POA: Diagnosis not present

## 2021-09-03 DIAGNOSIS — C7931 Secondary malignant neoplasm of brain: Secondary | ICD-10-CM | POA: Diagnosis not present

## 2021-09-03 DIAGNOSIS — C782 Secondary malignant neoplasm of pleura: Secondary | ICD-10-CM | POA: Diagnosis not present

## 2021-09-03 DIAGNOSIS — C3492 Malignant neoplasm of unspecified part of left bronchus or lung: Secondary | ICD-10-CM | POA: Diagnosis not present

## 2021-09-03 DIAGNOSIS — Z95828 Presence of other vascular implants and grafts: Secondary | ICD-10-CM | POA: Diagnosis not present

## 2021-09-03 DIAGNOSIS — Z5112 Encounter for antineoplastic immunotherapy: Secondary | ICD-10-CM | POA: Diagnosis present

## 2021-09-03 DIAGNOSIS — C3412 Malignant neoplasm of upper lobe, left bronchus or lung: Secondary | ICD-10-CM | POA: Diagnosis not present

## 2021-09-03 DIAGNOSIS — Z5111 Encounter for antineoplastic chemotherapy: Secondary | ICD-10-CM | POA: Diagnosis present

## 2021-09-03 LAB — CBC WITH DIFFERENTIAL (CANCER CENTER ONLY)
Abs Immature Granulocytes: 0.12 10*3/uL — ABNORMAL HIGH (ref 0.00–0.07)
Basophils Absolute: 0 10*3/uL (ref 0.0–0.1)
Basophils Relative: 1 %
Eosinophils Absolute: 0.2 10*3/uL (ref 0.0–0.5)
Eosinophils Relative: 2 %
HCT: 29.4 % — ABNORMAL LOW (ref 39.0–52.0)
Hemoglobin: 10.1 g/dL — ABNORMAL LOW (ref 13.0–17.0)
Immature Granulocytes: 2 %
Lymphocytes Relative: 23 %
Lymphs Abs: 1.7 10*3/uL (ref 0.7–4.0)
MCH: 32.7 pg (ref 26.0–34.0)
MCHC: 34.4 g/dL (ref 30.0–36.0)
MCV: 95.1 fL (ref 80.0–100.0)
Monocytes Absolute: 0.7 10*3/uL (ref 0.1–1.0)
Monocytes Relative: 10 %
Neutro Abs: 4.7 10*3/uL (ref 1.7–7.7)
Neutrophils Relative %: 62 %
Platelet Count: 206 10*3/uL (ref 150–400)
RBC: 3.09 MIL/uL — ABNORMAL LOW (ref 4.22–5.81)
RDW: 13.7 % (ref 11.5–15.5)
WBC Count: 7.4 10*3/uL (ref 4.0–10.5)
nRBC: 0 % (ref 0.0–0.2)

## 2021-09-03 LAB — TSH: TSH: 4.988 u[IU]/mL — ABNORMAL HIGH (ref 0.350–4.500)

## 2021-09-03 LAB — CMP (CANCER CENTER ONLY)
ALT: 20 U/L (ref 0–44)
AST: 21 U/L (ref 15–41)
Albumin: 3.4 g/dL — ABNORMAL LOW (ref 3.5–5.0)
Alkaline Phosphatase: 66 U/L (ref 38–126)
Anion gap: 5 (ref 5–15)
BUN: 20 mg/dL (ref 8–23)
CO2: 30 mmol/L (ref 22–32)
Calcium: 9.5 mg/dL (ref 8.9–10.3)
Chloride: 105 mmol/L (ref 98–111)
Creatinine: 0.95 mg/dL (ref 0.61–1.24)
GFR, Estimated: >60 mL/min (ref >60)
Glucose, Bld: 102 mg/dL — ABNORMAL HIGH (ref 70–99)
Potassium: 4.1 mmol/L (ref 3.5–5.1)
Sodium: 140 mmol/L (ref 135–145)
Total Bilirubin: 0.3 mg/dL (ref 0.3–1.2)
Total Protein: 6.2 g/dL — ABNORMAL LOW (ref 6.5–8.1)

## 2021-09-03 MED ORDER — SODIUM CHLORIDE 0.9% FLUSH
10.0000 mL | Freq: Once | INTRAVENOUS | Status: AC
  Administered 2021-09-03: 10 mL

## 2021-09-03 MED ORDER — HEPARIN SOD (PORK) LOCK FLUSH 100 UNIT/ML IV SOLN
500.0000 [IU] | Freq: Once | INTRAVENOUS | Status: AC
  Administered 2021-09-03: 500 [IU]

## 2021-09-03 NOTE — Patient Instructions (Signed)

## 2021-09-03 NOTE — Progress Notes (Signed)
[employer] [phone]   Fax:([phone]) [phone]  OFFICE PROGRESS NOTE  Peter Hong, MD [address] [place] [address]  DIAGNOSIS: Stage IV (T1b, N2, M1a) non-small cell lung cancer, adenocarcinoma diagnosed in July 2022 and presented with left upper lobe nodule in addition to AP window lymphadenopathy and left-sided malignant pleural effusion as well as pleural metastatic disease.  The patient also has solitary left temporal brain metastasis.  Biomarker Findings Microsatellite status - MS-Stable Tumor Mutational Burden - 4 Muts/Mb Genomic Findings For a complete list of the genes assayed, please refer to the Appendix. BTD9RC B638G PTEN splice site [id number] - subclonal? DOT1L S911L - subclonal? RAD21 S271* RB1 loss exons 3-23 TP53 N336f*34 8 Disease relevant genes with no reportable alterations: ALK, BRAF, EGFR, ERBB2, KRAS, MET, RET, ROS1  PRIOR THERAPY: [facility]ing [facility]  CURRENT THERAPY: Systemic chemotherapy with carboplatin for AUC of 5, Alimta 500 Mg/M2 and Keytruda 200 Mg IV every 3 weeks.  First dose August 13, 2020.  Status post 17 cycles.  Starting from cycle #5 he is on maintenance treatment with Alimta and Keytruda every 3 weeks.  INTERVAL HISTORY: Peter Noah81y.o. male returns to the clinic today for follow-up visit accompanied by his wife.  [facility] still recovering from his recent infection.  He continues to have fatigue and weakness as well as mild cough and shortness of breath.  He denied having any current fever or chills.  He has no chest pain or hemoptysis.  He has no nausea, vomiting, diarrhea or constipation.  He has no headache or visual changes.  He was here today for evaluation before starting cycle #18 of his treatment.   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 ICD10 Conversion    ALLERGIES:  has No Known Allergies.  MEDICATIONS:  Current Outpatient Medications  Medication Sig Dispense Refill   dexamethasone (DECADRON) 2 MG tablet Take 2 tablets (4 mg total) by mouth daily for 7 days, THEN 1 tablet (2 mg total) daily. 74 tablet 0   acetaminophen (TYLENOL) 500 MG tablet Take 500 mg by mouth every 6 (six) hours as needed for headache.     albuterol (VENTOLIN HFA) 108 (90 Base) MCG/ACT inhaler Inhale 2 puffs into the lungs every 4 (four) hours as needed for wheezing or shortness of breath.     aspirin 81 MG EC tablet Take 81 mg by mouth daily.     Bacillus Coagulans-Inulin (PROBIOTIC) 1-250 BILLION-MG CAPS Take 1 tablet by mouth daily.     bisacodyl (DULCOLAX) 5 MG EC tablet Take 5 mg by mouth 3 (three) times daily as needed for moderate constipation.     Cholecalciferol (D3 PO) Take 1 tablet by mouth daily.     coal tar-salicylic acid 2 % shampoo Apply topically daily as needed for itching.     DULoxetine (CYMBALTA) 30 MG capsule Take 30 mg by mouth daily.     fexofenadine (ALLEGRA) 180 MG tablet Take  180 mg by mouth daily as needed for allergies.     fluocinonide cream (LIDEX) 7.12 % Apply 1 application  topically 2 (two) times daily as needed (psroasis).     folic acid (FOLVITE) 1 MG tablet TAKE 1 TABLET(1 MG) BY MOUTH DAILY 30 tablet 4   hydrocortisone cream 1 % Apply 1 application topically 2 (two) times daily. (Patient taking differently: Apply 1 application  topically 2 (two) times daily as needed (rash).) 453.6 g 0   ketoconazole (NIZORAL) 2 % cream Apply 1  application  topically daily as needed (rash).     Krill Oil (OMEGA-3) 500 MG CAPS Take 1 capsule by mouth daily.     levothyroxine (SYNTHROID) 25 MCG tablet Take 1 tablet (25 mcg total) by mouth daily before breakfast. 30 tablet 2   lidocaine-prilocaine (EMLA) cream Apply 1 application topically as needed. (Patient taking differently: Apply 1 application  topically daily as needed (access port).) 30 g 1   Magnesium 500 MG TABS Take 1 tablet by mouth every evening.     Niacin (VITAMIN B-3 PO) Take 1 tablet by mouth daily.     omeprazole (PRILOSEC) 40 MG capsule Take 40 mg by mouth every evening.     No current facility-administered medications for this visit.    SURGICAL HISTORY:  Past Surgical History:  Procedure Laterality Date   IR IMAGING GUIDED PORT INSERTION  11/24/2020   KNEE SURGERY     orthoscopic     TONSILLECTOMY AND ADENOIDECTOMY      REVIEW OF SYSTEMS:  Constitutional: positive for anorexia, fatigue, and weight loss Eyes: negative Ears, nose, mouth, throat, and face: negative Respiratory: positive for cough and dyspnea on exertion Cardiovascular: negative Gastrointestinal: negative Genitourinary:negative Integument/breast: negative Hematologic/lymphatic: negative Musculoskeletal:positive for muscle weakness Neurological: negative Behavioral/Psych: negative Endocrine: negative Allergic/Immunologic: negative   PHYSICAL EXAMINATION: General appearance: alert, cooperative, fatigued, and no distress Head: Normocephalic, without obvious abnormality, atraumatic Neck: no adenopathy, no JVD, supple, symmetrical, trachea midline, and thyroid not enlarged, symmetric, no tenderness/mass/nodules Lymph nodes: Cervical, supraclavicular, and axillary nodes normal. Resp: clear to auscultation bilaterally Back: symmetric, no curvature. ROM normal. No CVA tenderness. Cardio: regular rate and rhythm, S1, S2 normal, no murmur, click, rub or gallop GI: soft, non-tender; bowel sounds  normal; no masses,  no organomegaly Extremities: extremities normal, atraumatic, no cyanosis or edema Neurologic: Alert and oriented X 3, normal strength and tone. Normal symmetric reflexes. Normal coordination and gait  ECOG PERFORMANCE STATUS: 1 - Symptomatic but completely ambulatory  Blood pressure (!) 93/56, pulse 72, temperature 98 F (36.7 C), temperature source Oral, resp. rate 16, weight 173 lb 12.8 oz (78.8 kg), SpO2 98 %.  LABORATORY DATA: Lab Results  Component Value Date   WBC 7.4 09/03/2021   HGB 10.1 (L) 09/03/2021   HCT 29.4 (L) 09/03/2021   MCV 95.1 09/03/2021   PLT 206 09/03/2021      Chemistry      Component Value Date/Time   NA 140 09/03/2021 0941   K 4.1 09/03/2021 0941   CL 105 09/03/2021 0941   CO2 30 09/03/2021 0941   BUN 20 09/03/2021 0941   CREATININE 0.95 09/03/2021 0941      Component Value Date/Time   CALCIUM 9.5 09/03/2021 0941   ALKPHOS 66 09/03/2021 0941   AST 21 09/03/2021 0941   ALT 20 09/03/2021 0941   BILITOT 0.3 09/03/2021 0941       RADIOGRAPHIC STUDIES: MR Brain W and Wo Contrast  Result Date: 08/25/2021 CLINICAL  DATA:  Acute neurologic deficit. Intracranial metastatic disease. EXAM: MRI HEAD WITHOUT AND WITH CONTRAST TECHNIQUE: Multiplanar, multiecho pulse sequences of the brain and surrounding structures were obtained without and with intravenous contrast. CONTRAST:  36m GADAVIST GADOBUTROL 1 MMOL/ML IV SOLN COMPARISON:  07/29/2021 FINDINGS: Brain: No acute infarct or hemorrhage. Unchanged appearance of anterior left temporal lesion, which measures 2.5 cm. Surrounding edema is also unchanged. The degree of thickness of the peripheral contrast enhancement has decreased though this could be partially due to technical factors. Punctate lesion of the right paramedian cerebellum is unchanged (series 16, image 36). Punctate right occipital lesion (image 70) is unchanged. Vascular: Major flow voids are preserved. Skull and upper cervical  spine: Normal calvarium and skull base. Visualized upper cervical spine and soft tissues are normal. Sinuses/Orbits:No paranasal sinus fluid levels or advanced mucosal thickening. No mastoid or middle ear effusion. Normal orbits. IMPRESSION: Unchanged size of 3 intracranial metastases with the dominant lesion the anterior left temporal. Electronically Signed   By: KUlyses JarredM.D.   On: 08/25/2021 00:18   CT Angio Chest PE W and/or Wo Contrast  Result Date: 08/24/2021 CLINICAL DATA:  Shortness of breath, fever EXAM: CT ANGIOGRAPHY CHEST WITH CONTRAST TECHNIQUE: Multidetector CT imaging of the chest was performed using the standard protocol during bolus administration of intravenous contrast. Multiplanar CT image reconstructions and MIPs were obtained to evaluate the vascular anatomy. RADIATION DOSE REDUCTION: This exam was performed according to the departmental dose-optimization program which includes automated exposure control, adjustment of the mA and/or kV according to patient size and/or use of iterative reconstruction technique. CONTRAST:  465mOMNIPAQUE IOHEXOL 350 MG/ML SOLN COMPARISON:  CT 07/16/2021 FINDINGS: Cardiovascular: Satisfactory opacification of the pulmonary arteries to the segmental level. No evidence of pulmonary embolism. Thoracic aorta is nonaneurysmal. Atherosclerotic calcification of the aorta and coronary arteries. Normal heart size. No pericardial effusion. Right-sided chest port in place with distal tip terminating at the level of the right atrium. Mediastinum/Nodes: No enlarged mediastinal, hilar, or axillary lymph nodes. Thyroid gland, trachea, and esophagus demonstrate no significant findings. Lungs/Pleura: Extensive ground-glass opacities throughout both lungs with more consolidative opacity in the left perihilar region. 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 Known left upper lobe pulmonary nodule is not well assessed on the  current study given adjacent consolidation. No new pulmonary nodules or masses are identified. No pleural effusion or pneumothorax. Upper Abdomen: No acute abnormality. Musculoskeletal: No new or acute osseous findings. No suspicious lytic or sclerotic bone lesions. Review of the MIP images confirms the above findings. IMPRESSION: 1. No evidence of acute pulmonary embolism. 2. Extensive ground-glass opacities throughout both lungs with more consolidative opacity in the left perihilar region. Findings are most suggestive of multifocal pneumonia. Radiographic follow-up after appropriate treatment is recommended to document resolution. 3. Known left upper lobe pulmonary nodule is not well assessed on the current study given adjacent consolidation. No new pulmonary nodules or masses are identified. Aortic Atherosclerosis (ICD10-I70.0). Electronically Signed   By: NiDavina Poke.O.   On: 08/24/2021 15:05   DG Chest 2 View  Result Date: 08/24/2021 CLINICAL DATA:  Fever, shortness of breath EXAM: CHEST - 2 VIEW COMPARISON:  Previous studies including CT done on 07/16/2021 and chest radiograph done on 07/21/2020 FINDINGS: Cardiac size is within normal limits. Tip of right IJ chest port is seen in superior vena cava. Alveolar densities are seen in left parahilar region. Increased interstitial markings are seen in both lower lung fields.  There is no pleural effusion or pneumothorax. IMPRESSION: There is alveolar density overlying the left hilum and left perihilar region suggesting pneumonia or underlying neoplastic process. Increased interstitial markings in both lower lung fields suggest scarring or interstitial pneumonia. Electronically Signed   By: Elmer Picker M.D.   On: 08/24/2021 13:18    ASSESSMENT AND PLAN: This is a very pleasant 81 years old white male recently diagnosed with a stage IV (T1b, N2, M1a) non-small cell lung cancer, adenocarcinoma presented with left upper lobe lung nodule in addition  to mediastinal lymphadenopathy and pleural-based metastasis as well as malignant left pleural effusion diagnosed in July 2022. His molecular studies by foundation 1 showed no actionable mutations. The patient is currently undergoing systemic chemotherapy with carboplatin for AUC of 5, Alimta 500 Mg/M2 and Keytruda 200 Mg IV every 3 weeks.  He is status post 17 cycles.  Starting from cycle #5 the patient will be on treatment with Alimta and Keytruda every 3 weeks. He underwent palliative radiotherapy to the enlarging left upper lobe lung mass by radiation oncology in [place]. The patient continues to tolerate his treatment with maintenance Alimta and Keytruda fairly well that he was recently admitted to [facility] with pneumonia. He is still recovering from his recent pneumonia.  He had imaging studies including CT scan of the chest as well as MRI of the brain during his hospitalization that showed no concerning findings for disease progression. I recommended for the patient to continue with his current maintenance therapy but I will delay the start of cycle #18 until September 14, 2021 to give her more time to recover from the recent pneumonia. The patient and his wife are in agreement with the current plan. He was advised to call immediately if he has any other concerning symptoms in the interval. The patient voices understanding of current disease status and treatment options and is in agreement with the current care plan.  All questions were answered. The patient knows to call the clinic with any problems, questions or concerns. We can certainly see the patient much sooner if necessary.  Disclaimer: This note was dictated with voice recognition software. Similar sounding words can inadvertently be transcribed and may not be corrected upon review.

## 2021-09-03 NOTE — Progress Notes (Signed)
  Radiation Oncology         [phone] ________________________________  Name: Peter Brown MRN: [id number]  Date of Service: 09/14/2021  DOB: 11-Dec-1940  Post Treatment Telephone Note  Diagnosis:   Progressive Metastatic Stage IV, cT1bN1M1b, NSCLC, adenocarcinoma of the Left Upper Lobe  Intent: Palliative  Radiation Treatment Dates: 08/14/2021 through 08/14/2021 Site: Brain Technique Total Dose (Gy) Dose per Fx (Gy) Completed Fx Beam Energies  PTV_2_CerebellVerm_80mm  PTV_3_OccipR_27mm IMRT IMRT 20/20 20/20 20 20  1/1 1/1 [id number] 6XFFF   Narrative: The patient tolerated radiation therapy relatively well.  He has an upcoming MRI later this month. The patient is doing well since radiation and is recovering from a pneumonia.    Impression/Plan: 1. Progressive Metastatic Stage IV, cT1bN1M1b, NSCLC, adenocarcinoma of the Left Upper Lobe. The patient has been doing well since completion of radiotherapy. We will follow him in the brain oncology program with Dr. Mickeal Skinner, but he will also continue to follow up with Dr. Julien Nordmann in medical oncology.      Carola Rhine, PAC

## 2021-09-04 ENCOUNTER — Other Ambulatory Visit: Payer: Self-pay

## 2021-09-04 LAB — T4: T4, Total: 6 ug/dL (ref 4.5–12.0)

## 2021-09-10 ENCOUNTER — Ambulatory Visit: Payer: Medicare HMO | Admitting: Radiation Oncology

## 2021-09-14 ENCOUNTER — Inpatient Hospital Stay: Payer: Medicare HMO

## 2021-09-14 ENCOUNTER — Ambulatory Visit
Admission: RE | Admit: 2021-09-14 | Discharge: 2021-09-14 | Disposition: A | Discharge status: Home / Self Care | Payer: Medicare HMO | Source: Ambulatory Visit | Attending: Radiation Oncology | Admitting: Radiation Oncology

## 2021-09-14 ENCOUNTER — Other Ambulatory Visit: Payer: Self-pay

## 2021-09-14 ENCOUNTER — Inpatient Hospital Stay: Payer: Medicare HMO | Attending: Internal Medicine

## 2021-09-14 VITALS — BP 114/72 | HR 68 | Temp 97.8°F | Resp 17

## 2021-09-14 DIAGNOSIS — C3492 Malignant neoplasm of unspecified part of left bronchus or lung: Secondary | ICD-10-CM | POA: Insufficient documentation

## 2021-09-14 DIAGNOSIS — Z5111 Encounter for antineoplastic chemotherapy: Secondary | ICD-10-CM | POA: Diagnosis present

## 2021-09-14 DIAGNOSIS — C3412 Malignant neoplasm of upper lobe, left bronchus or lung: Secondary | ICD-10-CM | POA: Insufficient documentation

## 2021-09-14 DIAGNOSIS — Z5112 Encounter for antineoplastic immunotherapy: Secondary | ICD-10-CM | POA: Diagnosis present

## 2021-09-14 DIAGNOSIS — Z79899 Other long term (current) drug therapy: Secondary | ICD-10-CM | POA: Diagnosis not present

## 2021-09-14 DIAGNOSIS — Z95828 Presence of other vascular implants and grafts: Secondary | ICD-10-CM

## 2021-09-14 LAB — CMP (CANCER CENTER ONLY)
ALT: 18 U/L (ref 0–44)
AST: 20 U/L (ref 15–41)
Albumin: 3.8 g/dL (ref 3.5–5.0)
Alkaline Phosphatase: 74 U/L (ref 38–126)
Anion gap: 3 — ABNORMAL LOW (ref 5–15)
BUN: 19 mg/dL (ref 8–23)
CO2: 31 mmol/L (ref 22–32)
Calcium: 9.7 mg/dL (ref 8.9–10.3)
Chloride: 102 mmol/L (ref 98–111)
Creatinine: 1.04 mg/dL (ref 0.61–1.24)
GFR, Estimated: >60 mL/min (ref >60)
Glucose, Bld: 125 mg/dL — ABNORMAL HIGH (ref 70–99)
Potassium: 4.7 mmol/L (ref 3.5–5.1)
Sodium: 136 mmol/L (ref 135–145)
Total Bilirubin: 0.4 mg/dL (ref 0.3–1.2)
Total Protein: 6.4 g/dL — ABNORMAL LOW (ref 6.5–8.1)

## 2021-09-14 LAB — CBC WITH DIFFERENTIAL (CANCER CENTER ONLY)
Abs Immature Granulocytes: 0.09 10*3/uL — ABNORMAL HIGH (ref 0.00–0.07)
Basophils Absolute: 0 10*3/uL (ref 0.0–0.1)
Basophils Relative: 0 %
Eosinophils Absolute: 0.1 10*3/uL (ref 0.0–0.5)
Eosinophils Relative: 1 %
HCT: 34.4 % — ABNORMAL LOW (ref 39.0–52.0)
Hemoglobin: 11.7 g/dL — ABNORMAL LOW (ref 13.0–17.0)
Immature Granulocytes: 1 %
Lymphocytes Relative: 17 %
Lymphs Abs: 1.4 10*3/uL (ref 0.7–4.0)
MCH: 33.1 pg (ref 26.0–34.0)
MCHC: 34 g/dL (ref 30.0–36.0)
MCV: 97.2 fL (ref 80.0–100.0)
Monocytes Absolute: 0.7 10*3/uL (ref 0.1–1.0)
Monocytes Relative: 9 %
Neutro Abs: 5.9 10*3/uL (ref 1.7–7.7)
Neutrophils Relative %: 72 %
Platelet Count: 178 10*3/uL (ref 150–400)
RBC: 3.54 MIL/uL — ABNORMAL LOW (ref 4.22–5.81)
RDW: 14.2 % (ref 11.5–15.5)
WBC Count: 8.2 10*3/uL (ref 4.0–10.5)
nRBC: 0 % (ref 0.0–0.2)

## 2021-09-14 LAB — TSH: TSH: 2.843 u[IU]/mL (ref 0.350–4.500)

## 2021-09-14 MED ORDER — SODIUM CHLORIDE 0.9 % IV SOLN
500.0000 mg/m2 | Freq: Once | INTRAVENOUS | Status: AC
  Administered 2021-09-14: 1000 mg via INTRAVENOUS
  Filled 2021-09-14: qty 40

## 2021-09-14 MED ORDER — SODIUM CHLORIDE 0.9% FLUSH
10.0000 mL | INTRAVENOUS | Status: DC | PRN
  Administered 2021-09-14: 10 mL

## 2021-09-14 MED ORDER — SODIUM CHLORIDE 0.9 % IV SOLN
200.0000 mg | Freq: Once | INTRAVENOUS | Status: AC
  Administered 2021-09-14: 200 mg via INTRAVENOUS
  Filled 2021-09-14: qty 200

## 2021-09-14 MED ORDER — CYANOCOBALAMIN 1000 MCG/ML IJ SOLN
1000.0000 ug | Freq: Once | INTRAMUSCULAR | Status: AC
  Administered 2021-09-14: 1000 ug via INTRAMUSCULAR
  Filled 2021-09-14: qty 1

## 2021-09-14 MED ORDER — PROCHLORPERAZINE MALEATE 10 MG PO TABS
10.0000 mg | ORAL_TABLET | Freq: Once | ORAL | Status: AC
  Administered 2021-09-14: 10 mg via ORAL
  Filled 2021-09-14: qty 1

## 2021-09-14 MED ORDER — HEPARIN SOD (PORK) LOCK FLUSH 100 UNIT/ML IV SOLN
500.0000 [IU] | Freq: Once | INTRAVENOUS | Status: AC | PRN
  Administered 2021-09-14: 500 [IU]

## 2021-09-14 MED ORDER — SODIUM CHLORIDE 0.9% FLUSH
10.0000 mL | Freq: Once | INTRAVENOUS | Status: AC
  Administered 2021-09-14: 10 mL

## 2021-09-14 MED ORDER — SODIUM CHLORIDE 0.9 % IV SOLN: Freq: Once | INTRAVENOUS | Status: AC

## 2021-09-14 NOTE — Progress Notes (Signed)
Patient stated that he is still having some SOB since the pneumonia and has not returned to his baseline. Made Dr. Julien Nordmann aware of this and received an okay to treat as long as labs are within parameters.

## 2021-09-14 NOTE — Patient Instructions (Signed)
[facility]  Discharge Instructions: Thank you for choosing [facility] to provide your oncology and hematology care.   If you have a lab appointment with the [facility], please go directly to the [facility] and check in at the registration area.   Wear comfortable clothing and clothing appropriate for easy access to any Portacath or PICC line.   We strive to give you quality time with your provider. You may need to reschedule your appointment if you arrive late (15 or more minutes).  Arriving late affects you and other patients whose appointments are after yours.  Also, if you miss three or more appointments without notifying the office, you may be dismissed from the clinic at the provider's discretion.      For prescription refill requests, have your pharmacy contact our office and allow 72 hours for refills to be completed.    Today you received the following chemotherapy and/or immunotherapy agents : Keytruda, Alimta      To help prevent nausea and vomiting after your treatment, we encourage you to take your nausea medication as directed.  BELOW ARE SYMPTOMS THAT SHOULD BE REPORTED IMMEDIATELY: *FEVER GREATER THAN 100.4 F (38 C) OR HIGHER *CHILLS OR SWEATING *NAUSEA AND VOMITING THAT IS NOT CONTROLLED WITH YOUR NAUSEA MEDICATION *UNUSUAL SHORTNESS OF BREATH *UNUSUAL BRUISING OR BLEEDING *URINARY PROBLEMS (pain or burning when urinating, or frequent urination) *BOWEL PROBLEMS (unusual diarrhea, constipation, pain near the anus) TENDERNESS IN MOUTH AND THROAT WITH OR WITHOUT PRESENCE OF ULCERS (sore throat, sores in mouth, or a toothache) UNUSUAL RASH, SWELLING OR PAIN  UNUSUAL VAGINAL DISCHARGE OR ITCHING   Items with * indicate a potential emergency and should be followed up as soon as possible or go to the Emergency Department if any problems should occur.  Please show the CHEMOTHERAPY ALERT CARD or IMMUNOTHERAPY ALERT CARD at  check-in to the Emergency Department and triage nurse.  Should you have questions after your visit or need to cancel or reschedule your appointment, please contact [facility]  Dept: [phone]  and follow the prompts.  Office hours are 8:00 a.m. to 4:30 p.m. Monday - Friday. Please note that voicemails left after 4:00 p.m. may not be returned until the following business day.  We are closed weekends and major holidays. You have access to a nurse at all times for urgent questions. Please call the main number to the clinic Dept: [phone] and follow the prompts.   For any non-urgent questions, you may also contact your provider using MyChart. We now offer e-Visits for anyone 70 and older to request care online for non-urgent symptoms. For details visit [email or web address].   Also download the MyChart app! Go to the app store, search "MyChart", open the app, select [facility], and log in with your MyChart username and password.  Masks are optional in the cancer centers. If you would like for your care team to wear a mask while they are taking care of you, please let them know. You may have one support person who is at least 80 years old accompany you for your appointments.

## 2021-09-18 ENCOUNTER — Inpatient Hospital Stay: Admission: RE | Admit: 2021-09-18 | Payer: Medicare HMO | Source: Ambulatory Visit

## 2021-09-21 ENCOUNTER — Other Ambulatory Visit: Payer: Medicare HMO

## 2021-09-21 ENCOUNTER — Ambulatory Visit: Payer: Medicare HMO | Admitting: Internal Medicine

## 2021-09-21 ENCOUNTER — Ambulatory Visit: Payer: Medicare HMO

## 2021-09-22 ENCOUNTER — Ambulatory Visit: Payer: Medicare HMO | Admitting: Physician Assistant

## 2021-09-24 ENCOUNTER — Ambulatory Visit: Payer: Medicare HMO | Admitting: Physician Assistant

## 2021-09-24 ENCOUNTER — Ambulatory Visit: Payer: Medicare HMO

## 2021-09-24 ENCOUNTER — Other Ambulatory Visit: Payer: Medicare HMO

## 2021-09-28 ENCOUNTER — Other Ambulatory Visit: Payer: Self-pay | Admitting: Physician Assistant

## 2021-09-28 ENCOUNTER — Encounter: Payer: Self-pay | Admitting: Internal Medicine

## 2021-09-28 ENCOUNTER — Other Ambulatory Visit: Payer: Self-pay

## 2021-09-30 ENCOUNTER — Other Ambulatory Visit: Payer: Self-pay | Admitting: Radiation Therapy

## 2021-09-30 ENCOUNTER — Other Ambulatory Visit: Payer: Self-pay

## 2021-09-30 DIAGNOSIS — C7931 Secondary malignant neoplasm of brain: Secondary | ICD-10-CM

## 2021-09-30 NOTE — Progress Notes (Signed)
Orders entered for port access and de-access the day of brain MRI at [facility].   Mont Dutton R.T.(R)(T) Radiation Special Procedures Navigator

## 2021-10-02 ENCOUNTER — Ambulatory Visit
Admission: RE | Admit: 2021-10-02 | Discharge: 2021-10-02 | Disposition: A | Discharge status: Home / Self Care | Payer: Medicare HMO | Source: Ambulatory Visit | Attending: Internal Medicine | Admitting: Internal Medicine

## 2021-10-02 DIAGNOSIS — C7931 Secondary malignant neoplasm of brain: Secondary | ICD-10-CM

## 2021-10-02 MED ORDER — HEPARIN SOD (PORK) LOCK FLUSH 100 UNIT/ML IV SOLN
500.0000 [IU] | Freq: Once | INTRAVENOUS | Status: AC
  Administered 2021-10-02: 500 [IU] via INTRAVENOUS

## 2021-10-02 MED ORDER — GADOBENATE DIMEGLUMINE 529 MG/ML IV SOLN
17.0000 mL | Freq: Once | INTRAVENOUS | Status: AC | PRN
  Administered 2021-10-02: 17 mL via INTRAVENOUS

## 2021-10-02 MED ORDER — SODIUM CHLORIDE 0.9% FLUSH
10.0000 mL | INTRAVENOUS | Status: DC | PRN
  Administered 2021-10-02: 10 mL via INTRAVENOUS

## 2021-10-05 ENCOUNTER — Inpatient Hospital Stay: Payer: Medicare HMO

## 2021-10-05 ENCOUNTER — Other Ambulatory Visit: Payer: Self-pay

## 2021-10-05 ENCOUNTER — Encounter: Payer: Self-pay | Admitting: Internal Medicine

## 2021-10-05 ENCOUNTER — Inpatient Hospital Stay: Payer: Medicare HMO | Attending: Internal Medicine | Admitting: Internal Medicine

## 2021-10-05 ENCOUNTER — Inpatient Hospital Stay: Payer: Medicare HMO | Admitting: Internal Medicine

## 2021-10-05 VITALS — BP 113/66 | HR 72 | Temp 97.6°F | Resp 18 | Wt 181.1 lb

## 2021-10-05 DIAGNOSIS — C3492 Malignant neoplasm of unspecified part of left bronchus or lung: Secondary | ICD-10-CM | POA: Diagnosis not present

## 2021-10-05 DIAGNOSIS — Z79899 Other long term (current) drug therapy: Secondary | ICD-10-CM | POA: Diagnosis not present

## 2021-10-05 DIAGNOSIS — C3490 Malignant neoplasm of unspecified part of unspecified bronchus or lung: Secondary | ICD-10-CM | POA: Diagnosis not present

## 2021-10-05 DIAGNOSIS — Z5111 Encounter for antineoplastic chemotherapy: Secondary | ICD-10-CM | POA: Insufficient documentation

## 2021-10-05 DIAGNOSIS — Z5112 Encounter for antineoplastic immunotherapy: Secondary | ICD-10-CM | POA: Diagnosis present

## 2021-10-05 DIAGNOSIS — C7931 Secondary malignant neoplasm of brain: Secondary | ICD-10-CM | POA: Diagnosis not present

## 2021-10-05 DIAGNOSIS — Z95828 Presence of other vascular implants and grafts: Secondary | ICD-10-CM

## 2021-10-05 LAB — CBC WITH DIFFERENTIAL (CANCER CENTER ONLY)
Abs Immature Granulocytes: 0.11 10*3/uL — ABNORMAL HIGH (ref 0.00–0.07)
Basophils Absolute: 0 10*3/uL (ref 0.0–0.1)
Basophils Relative: 0 %
Eosinophils Absolute: 0.1 10*3/uL (ref 0.0–0.5)
Eosinophils Relative: 1 %
HCT: 33.5 % — ABNORMAL LOW (ref 39.0–52.0)
Hemoglobin: 11.4 g/dL — ABNORMAL LOW (ref 13.0–17.0)
Immature Granulocytes: 2 %
Lymphocytes Relative: 22 %
Lymphs Abs: 1.5 10*3/uL (ref 0.7–4.0)
MCH: 32.6 pg (ref 26.0–34.0)
MCHC: 34 g/dL (ref 30.0–36.0)
MCV: 95.7 fL (ref 80.0–100.0)
Monocytes Absolute: 0.7 10*3/uL (ref 0.1–1.0)
Monocytes Relative: 10 %
Neutro Abs: 4.4 10*3/uL (ref 1.7–7.7)
Neutrophils Relative %: 65 %
Platelet Count: 173 10*3/uL (ref 150–400)
RBC: 3.5 MIL/uL — ABNORMAL LOW (ref 4.22–5.81)
RDW: 15.2 % (ref 11.5–15.5)
WBC Count: 6.8 10*3/uL (ref 4.0–10.5)
nRBC: 0 % (ref 0.0–0.2)

## 2021-10-05 LAB — CMP (CANCER CENTER ONLY)
ALT: 18 U/L (ref 0–44)
AST: 20 U/L (ref 15–41)
Albumin: 3.8 g/dL (ref 3.5–5.0)
Alkaline Phosphatase: 83 U/L (ref 38–126)
Anion gap: 5 (ref 5–15)
BUN: 17 mg/dL (ref 8–23)
CO2: 30 mmol/L (ref 22–32)
Calcium: 9 mg/dL (ref 8.9–10.3)
Chloride: 103 mmol/L (ref 98–111)
Creatinine: 0.89 mg/dL (ref 0.61–1.24)
GFR, Estimated: >60 mL/min (ref >60)
Glucose, Bld: 129 mg/dL — ABNORMAL HIGH (ref 70–99)
Potassium: 4.1 mmol/L (ref 3.5–5.1)
Sodium: 138 mmol/L (ref 135–145)
Total Bilirubin: 0.4 mg/dL (ref 0.3–1.2)
Total Protein: 6 g/dL — ABNORMAL LOW (ref 6.5–8.1)

## 2021-10-05 LAB — TSH: TSH: 2.229 u[IU]/mL (ref 0.350–4.500)

## 2021-10-05 MED ORDER — SODIUM CHLORIDE 0.9% FLUSH
10.0000 mL | INTRAVENOUS | Status: DC | PRN
  Administered 2021-10-05: 10 mL

## 2021-10-05 MED ORDER — PROCHLORPERAZINE MALEATE 10 MG PO TABS
10.0000 mg | ORAL_TABLET | Freq: Once | ORAL | Status: AC
  Administered 2021-10-05: 10 mg via ORAL
  Filled 2021-10-05: qty 1

## 2021-10-05 MED ORDER — SODIUM CHLORIDE 0.9 % IV SOLN
200.0000 mg | Freq: Once | INTRAVENOUS | Status: AC
  Administered 2021-10-05: 200 mg via INTRAVENOUS
  Filled 2021-10-05: qty 200

## 2021-10-05 MED ORDER — DEXAMETHASONE 1 MG PO TABS: 1.0000 mg | ORAL_TABLET | Freq: Every day | ORAL | 0 refills | Status: DC

## 2021-10-05 MED ORDER — SODIUM CHLORIDE 0.9 % IV SOLN: Freq: Once | INTRAVENOUS | Status: AC

## 2021-10-05 MED ORDER — SODIUM CHLORIDE 0.9 % IV SOLN
500.0000 mg/m2 | Freq: Once | INTRAVENOUS | Status: AC
  Administered 2021-10-05: 1000 mg via INTRAVENOUS
  Filled 2021-10-05: qty 40

## 2021-10-05 MED ORDER — HEPARIN SOD (PORK) LOCK FLUSH 100 UNIT/ML IV SOLN
500.0000 [IU] | Freq: Once | INTRAVENOUS | Status: AC | PRN
  Administered 2021-10-05: 500 [IU]

## 2021-10-05 MED ORDER — SODIUM CHLORIDE 0.9% FLUSH
10.0000 mL | Freq: Once | INTRAVENOUS | Status: AC
  Administered 2021-10-05: 10 mL

## 2021-10-05 NOTE — Patient Instructions (Signed)
[facility]  Discharge Instructions: Thank you for choosing [facility] to provide your oncology and hematology care.   If you have a lab appointment with the [facility], please go directly to the [facility] and check in at the registration area.   Wear comfortable clothing and clothing appropriate for easy access to any Portacath or PICC line.   We strive to give you quality time with your provider. You may need to reschedule your appointment if you arrive late (15 or more minutes).  Arriving late affects you and other patients whose appointments are after yours.  Also, if you miss three or more appointments without notifying the office, you may be dismissed from the clinic at the provider's discretion.      For prescription refill requests, have your pharmacy contact our office and allow 72 hours for refills to be completed.    Today you received the following chemotherapy and/or immunotherapy agents : Keytruda, Alimta      To help prevent nausea and vomiting after your treatment, we encourage you to take your nausea medication as directed.  BELOW ARE SYMPTOMS THAT SHOULD BE REPORTED IMMEDIATELY: *FEVER GREATER THAN 100.4 F (38 C) OR HIGHER *CHILLS OR SWEATING *NAUSEA AND VOMITING THAT IS NOT CONTROLLED WITH YOUR NAUSEA MEDICATION *UNUSUAL SHORTNESS OF BREATH *UNUSUAL BRUISING OR BLEEDING *URINARY PROBLEMS (pain or burning when urinating, or frequent urination) *BOWEL PROBLEMS (unusual diarrhea, constipation, pain near the anus) TENDERNESS IN MOUTH AND THROAT WITH OR WITHOUT PRESENCE OF ULCERS (sore throat, sores in mouth, or a toothache) UNUSUAL RASH, SWELLING OR PAIN  UNUSUAL VAGINAL DISCHARGE OR ITCHING   Items with * indicate a potential emergency and should be followed up as soon as possible or go to the Emergency Department if any problems should occur.  Please show the CHEMOTHERAPY ALERT CARD or IMMUNOTHERAPY ALERT CARD at  check-in to the Emergency Department and triage nurse.  Should you have questions after your visit or need to cancel or reschedule your appointment, please contact [facility]  Dept: [phone]  and follow the prompts.  Office hours are 8:00 a.m. to 4:30 p.m. Monday - Friday. Please note that voicemails left after 4:00 p.m. may not be returned until the following business day.  We are closed weekends and major holidays. You have access to a nurse at all times for urgent questions. Please call the main number to the clinic Dept: [phone] and follow the prompts.   For any non-urgent questions, you may also contact your provider using MyChart. We now offer e-Visits for anyone 56 and older to request care online for non-urgent symptoms. For details visit [email or web address].   Also download the MyChart app! Go to the app store, search "MyChart", open the app, select [facility], and log in with your MyChart username and password.  Masks are optional in the cancer centers. If you would like for your care team to wear a mask while they are taking care of you, please let them know. You may have one support person who is at least 80 years old accompany you for your appointments.

## 2021-10-05 NOTE — Progress Notes (Signed)
[facility] [address], [place] [address] [phone]   Interval Evaluation  Date of Service: 10/05/21 Patient Name: Peter Brown Patient MRN: [id number] Patient DOB: 12-29-1940 Provider: Ventura Sellers, MD  Identifying Statement:  Peter Brown is a 81 y.o. 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[facility])   Primary Cancer:  Oncologic History: Oncology History  Adenocarcinoma, lung, left ([place])  08/04/2020 Initial Diagnosis   Adenocarcinoma of left lung, stage 4 ([place])   08/04/2020 Cancer Staging   Staging form: Lung, AJCC 8th Edition - Clinical: Stage IVA (cT1b, cN2, cM1a) - Signed by Peter Bears, MD on 08/04/2020   08/13/2020 -  Chemotherapy   Patient is on Treatment Plan : LUNG Carboplatin (5) + Pemetrexed (500) + Pembrolizumab (200) D1 q21d Induction x 4 cycles / Maintenance Pemetrexed (500) + Pembrolizumab (200) D1 q21d      CNS Oncologic History 08/25/20: SRS to L temporal metastasis Peter Brown) 08/14/21: SRSx2 salvage Peter Brown)  Interval History: Peter Brown presents for follow up today after recent MRI brain.  He and his wife both describe clear improvement in expression, language output since resuming the decadron.  He is currently dosing 2mg  daily without issue. No issues with the scan.  He remains mostly independent with function, now undergoing chemotherapy infusion with Dr. Julien Brown.    H+P (05/12/21) Patient presented to neurologic attention last week with new onset confusion.  His wife describes him forgetting names, choosing the wrong word, answering simple questions incorrectly.  Onset was over several days.  No seizure activity was witnessed.  No issues with his gait, no right sided weakness.  He denies headaches.  Decadron was started on 5/1 by rad onc, 4mg  twice per day.  He describes significant improvement, very close to his prior baseline, over the past 8 days.  He mowed his lawn this weekend, is otherwise  independent with activities of living.  On Pem/Pem with Dr. Julien Brown, doing well.  Medications: Current Outpatient Medications on File Prior to Visit  Medication Sig Dispense Refill   acetaminophen (TYLENOL) 500 MG tablet Take 500 mg by mouth every 6 (six) hours as needed for headache.     albuterol (VENTOLIN HFA) 108 (90 Base) MCG/ACT inhaler Inhale 2 puffs into the lungs every 4 (four) hours as needed for wheezing or shortness of breath.     aspirin 81 MG EC tablet Take 81 mg by mouth daily.     Bacillus Coagulans-Inulin (PROBIOTIC) 1-250 BILLION-MG CAPS Take 1 tablet by mouth daily.     bisacodyl (DULCOLAX) 5 MG EC tablet Take 5 mg by mouth 3 (three) times daily as needed for moderate constipation.     Cholecalciferol (D3 PO) Take 1 tablet by mouth daily.     coal tar-salicylic acid 2 % shampoo Apply topically daily as needed for itching.     dexamethasone (DECADRON) 2 MG tablet Take 2 tablets (4 mg total) by mouth daily for 7 days, THEN 1 tablet (2 mg total) daily. 74 tablet 0   DULoxetine (CYMBALTA) 30 MG capsule Take 30 mg by mouth daily.     fexofenadine (ALLEGRA) 180 MG tablet Take 180 mg by mouth daily as needed for allergies.     fluocinonide cream (LIDEX) 2.67 % Apply 1 application  topically 2 (two) times daily as needed (psroasis).     folic acid (FOLVITE) 1 MG tablet TAKE 1 TABLET(1 MG) BY MOUTH DAILY 30 tablet 4   hydrocortisone cream 1 %  Apply 1 application topically 2 (two) times daily. (Patient taking differently: Apply 1 application  topically 2 (two) times daily as needed (rash).) 453.6 g 0   ketoconazole (NIZORAL) 2 % cream Apply 1 application  topically daily as needed (rash).     Krill Oil (OMEGA-3) 500 MG CAPS Take 1 capsule by mouth daily.     levothyroxine (SYNTHROID) 25 MCG tablet Take 1 tablet (25 mcg total) by mouth daily before breakfast. 30 tablet 2   lidocaine-prilocaine (EMLA) cream Apply 1 application topically as needed. (Patient taking differently: Apply 1  application  topically daily as needed (access port).) 30 g 1   Magnesium 500 MG TABS Take 1 tablet by mouth every evening.     Niacin (VITAMIN B-3 PO) Take 1 tablet by mouth daily.     omeprazole (PRILOSEC) 40 MG capsule Take 40 mg by mouth every evening.     No current facility-administered medications on file prior to visit.    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 ICD10 Conversion   Past Surgical History:  Past Surgical History:  Procedure Laterality Date   IR IMAGING GUIDED PORT INSERTION  11/24/2020   KNEE SURGERY     orthoscopic     TONSILLECTOMY AND ADENOIDECTOMY     Social History:  Social History   Socioeconomic History   Marital status: Married    Spouse name: Not on file   Number of children: Not on file   Years of education: Not on file   Highest education level: Not on file  Occupational History   Not on file  Tobacco Use   Smoking status: Never   Smokeless tobacco: Never  Substance and Sexual Activity   Alcohol use: Never   Drug use: Never   Sexual activity: Not on file  Other Topics Concern   Not on file  Social History Narrative   Not on file   Social Determinants of Health   Financial Resource Strain: Low Risk  (08/22/2020)   Overall Financial Resource Strain (CARDIA)    Difficulty of Paying Living Expenses: Not very hard  Food Insecurity: No Food Insecurity (08/22/2020)   Hunger Vital Sign    Worried About Running Out of Food in the Last Year: Never true    Ran Out of Food in the Last Year: Never true  Transportation Needs: No Transportation  Needs (08/22/2020)   PRAPARE - Hydrologist (Medical): No    Lack of Transportation (Non-Medical): No  Physical Activity: Not on file  Stress: Stress Concern Present (08/22/2020)   [place]    Feeling of Stress : To some extent  Social Connections: Socially Integrated (08/22/2020)   Social Connection and Isolation Panel [NHANES]    Frequency of Communication with Friends and Family: More than three times a week    Frequency of Social Gatherings with Friends and Family: Once a week    Attends Religious Services: More than 4 times per year    Active Member of [employer] or Organizations: Yes    Attends [employer]  or Organization Meetings: More than 4 times per year    Marital Status: Married  Human resources officer Violence: Not on file   Family History: No family history on file.  Review of Systems: Constitutional: Doesn't report fevers, chills or abnormal weight loss Eyes: Doesn't report blurriness of vision Ears, nose, mouth, throat, and face: Doesn't report sore throat Respiratory: Doesn't report cough, dyspnea or wheezes Cardiovascular: Doesn't report palpitation, chest discomfort  Gastrointestinal:  Doesn't report nausea, constipation, diarrhea GU: Doesn't report incontinence Skin: Doesn't report skin rashes Neurological: Per HPI Musculoskeletal: Doesn't report joint pain Behavioral/Psych: Doesn't report anxiety  Physical Exam: Wt Readings from Last 3 Encounters:  10/05/21 181 lb 2 oz (82.2 kg)  09/03/21 173 lb 12.8 oz (78.8 kg)  08/24/21 179 lb 11.2 oz (81.5 kg)   Temp Readings from Last 3 Encounters:  10/05/21 97.6 F (36.4 C) (Oral)  09/14/21 97.8 F (36.6 C) (Oral)  09/03/21 98 F (36.7 C) (Oral)   BP Readings from Last 3 Encounters:  10/05/21 113/66  09/14/21 114/72  09/03/21 (!) 93/56   Pulse Readings from Last 3 Encounters:  10/05/21 72  09/14/21 68  09/03/21 72    KPS:  80. General: Alert, cooperative, pleasant, in no acute distress Head: Normal EENT: No conjunctival injection or scleral icterus.  Lungs: Resp effort normal Cardiac: Regular rate Abdomen: Non-distended abdomen Skin: No rashes cyanosis or petechiae. Extremities: No clubbing or edema  Neurologic Exam: Mental Status: Awake, alert, attentive to examiner. Oriented to self and environment. Language is notable for mildly impaired fluency.  Impaired short term memory, recall. Cranial Nerves: Visual acuity is grossly normal. Visual fields are full. Extra-ocular movements intact. No ptosis. Face is symmetric Motor: Tone and bulk are normal. Power is full in both arms and legs. Reflexes are symmetric, no pathologic reflexes present.  Sensory: Intact to light touch Gait: Normal.   Labs: I have reviewed the data as listed    Component Value Date/Time   NA 138 10/05/2021 1025   K 4.1 10/05/2021 1025   CL 103 10/05/2021 1025   CO2 30 10/05/2021 1025   GLUCOSE 129 (H) 10/05/2021 1025   BUN 17 10/05/2021 1025   CREATININE 0.89 10/05/2021 1025   CALCIUM 9.0 10/05/2021 1025   PROT 6.0 (L) 10/05/2021 1025   ALBUMIN 3.8 10/05/2021 1025   AST 20 10/05/2021 1025   ALT 18 10/05/2021 1025   ALKPHOS 83 10/05/2021 1025   BILITOT 0.4 10/05/2021 1025   GFRNONAA >60 10/05/2021 1025   Lab Results  Component Value Date   WBC 6.8 10/05/2021   NEUTROABS 4.4 10/05/2021   HGB 11.4 (L) 10/05/2021   HCT 33.5 (L) 10/05/2021   MCV 95.7 10/05/2021   PLT 173 10/05/2021    Imaging:  MR BRAIN W WO CONTRAST  Result Date: 10/03/2021 CLINICAL DATA:  History of lung and brain cancer. EXAM: MRI HEAD WITHOUT AND WITH CONTRAST TECHNIQUE: Multiplanar, multiecho pulse sequences of the brain and surrounding structures were obtained without and with intravenous contrast. CONTRAST:  40mL MULTIHANCE GADOBENATE DIMEGLUMINE 529 MG/ML IV SOLN COMPARISON:  08/24/2021 FINDINGS: Brain: 2.8 cm centrally necrotic peripherally  enhancing mass in the anterior left temporal lobe is unchanged in size and overall shape. The thickness of the peripheral enhancement is somewhat more prominent but measures similar. Regional vasogenic edema remains moderate. Unchanged punctate enhancement in the right cerebellum on 14:34, only seen on the source postcontrast thin section images. A right occipital lesion listed on prior is not confidently seen. No  new lesion. Generalized atrophy. No acute hemorrhage, hydrocephalus, or infarct. Vascular: Major flow voids and vascular enhancements are preserved Skull and upper cervical spine: No focal lesion. Sinuses/Orbits: Negative IMPRESSION: 1. No new or progressive disease. 2. Unchanged 2.8 cm left temporal lobe lesion with central necrosis and moderate vasogenic edema. Electronically Signed   By: Jorje Guild M.D.   On: 10/03/2021 11:38    [place] Clinician Interpretation: I have personally reviewed the radiological images as listed.  My interpretation, in the context of the patient's clinical presentation, is treatment effect vs true progression   Assessment/Plan Malignant neoplasm metastatic to brain [facility])  Oletta Darter is clinically improved today from neurologic standpoint.  MRI brain demonstrates stability of previously progressive left temporal metastasis.  Edema is clearly improved. Other recently treated sites are improved or stable.   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  We recommended decreasing decadron to 1mg  daily.  He will continue to undergo carbo/pem/pem with Dr. Julien Brown in the interim.  We appreciate the opportunity to participate in the care of Maclovio Henson.   We ask that Param Capri return to clinic in 2 months following next brain MRI, or sooner as needed.  All questions were answered. The patient knows to call the clinic with any problems, questions or concerns. No barriers to learning were detected.  The total time  spent in the encounter was 30 minutes and more than 50% was on counseling and review of test results   Peter Sellers, MD Medical Director of Neuro-Oncology [facility] at [place] 10/05/21 11:33 AM

## 2021-10-05 NOTE — Progress Notes (Signed)
Peter [phone]   Fax:([phone]  OFFICE PROGRESS NOTE  Peter Hong, MD [address] [place] [address]  DIAGNOSIS: Stage IV (T1b, N2, M1a) non-small cell lung cancer, adenocarcinoma diagnosed in July 2022 and presented with left upper lobe nodule in addition to AP window lymphadenopathy and left-sided malignant pleural effusion as well as pleural metastatic disease.  The patient also has solitary left temporal brain metastasis.  Biomarker Findings Microsatellite status - MS-Stable Tumor Mutational Burden - 4 Muts/Mb Genomic Findings For a complete list of the genes assayed, please refer to the Appendix. AYO4HT X774F PTEN splice site [id number] - subclonal? DOT1L S911L - subclonal? RAD21 S271* RB1 loss exons 3-23 TP53 N348f*34 8 Disease relevant genes with no reportable alterations: ALK, BRAF, EGFR, ERBB2, KRAS, MET, RET, ROS1  PRIOR THERAPY[facility]  CURRENT THERAPY: Systemic chemotherapy with carboplatin for AUC of 5, Alimta 500 Mg/M2 and Keytruda 200 Mg IV every 3 weeks.  First dose August 13, 2020.  Status post 18 cycles.  Starting from cycle #5 he is on maintenance treatment with Alimta and Keytruda every 3 weeks.  INTERVAL HISTORY: Peter Salzwedel81y.o. male returns to the clinic today for follow-up visit accompanied by his wife.  The patient is feeling much better today with no concerning complaints except for mild shortness of breath with exertion.  He denied having any headache or visual changes.  He has no nausea, vomiting, diarrhea or constipation.  He denied having any fever or chills.  He has no chest pain, cough or hemoptysis.  He tolerated the last cycle of his treatment fairly well.  He is here for evaluation before starting cycle #19.  He had repeat MRI of the brain performed recently.    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 ICD10 Conversion    ALLERGIES:  has No Known Allergies.  MEDICATIONS:  Current Outpatient Medications  Medication Sig Dispense Refill   acetaminophen (TYLENOL) 500 MG tablet Take 500 mg by mouth every 6 (six) hours as needed for headache.     albuterol (VENTOLIN HFA) 108 (90 Base) MCG/ACT inhaler Inhale 2 puffs into the lungs every 4 (four) hours as needed for wheezing or shortness of breath.     aspirin 81 MG EC tablet Take 81 mg by mouth daily.     Bacillus Coagulans-Inulin (PROBIOTIC) 1-250 BILLION-MG CAPS Take 1 tablet by mouth daily.     bisacodyl (DULCOLAX) 5 MG EC tablet Take 5 mg by mouth 3 (three) times daily as needed for moderate constipation.     Cholecalciferol (D3 PO) Take 1 tablet by mouth daily.     coal tar-salicylic acid 2 % shampoo Apply topically daily as needed for itching.     dexamethasone (DECADRON) 2 MG tablet Take 2 tablets (4 mg total) by mouth daily for 7 days, THEN 1 tablet (2 mg total) daily. 74 tablet 0   DULoxetine (CYMBALTA) 30 MG capsule Take 30 mg by mouth daily.     fexofenadine (ALLEGRA) 180 MG  tablet Take 180 mg by mouth daily as needed for allergies.     fluocinonide cream (LIDEX) 7.25 % Apply 1 application  topically 2 (two) times daily as needed (psroasis).     folic acid (FOLVITE) 1 MG tablet TAKE 1 TABLET(1 MG) BY MOUTH DAILY 30 tablet 4   hydrocortisone cream 1 % Apply 1 application topically 2 (two) times daily. (Patient taking differently: Apply 1 application  topically 2 (two) times daily as needed (rash).) 453.6 g 0   ketoconazole (NIZORAL) 2 %  cream Apply 1 application  topically daily as needed (rash).     Krill Oil (OMEGA-3) 500 MG CAPS Take 1 capsule by mouth daily.     levothyroxine (SYNTHROID) 25 MCG tablet Take 1 tablet (25 mcg total) by mouth daily before breakfast. 30 tablet 2   lidocaine-prilocaine (EMLA) cream Apply 1 application topically as needed. (Patient taking differently: Apply 1 application  topically daily as needed (access port).) 30 g 1   Magnesium 500 MG TABS Take 1 tablet by mouth every evening.     Niacin (VITAMIN B-3 PO) Take 1 tablet by mouth daily.     omeprazole (PRILOSEC) 40 MG capsule Take 40 mg by mouth every evening.     No current facility-administered medications for this visit.    SURGICAL HISTORY:  Past Surgical History:  Procedure Laterality Date   IR IMAGING GUIDED PORT INSERTION  11/24/2020   KNEE SURGERY     orthoscopic     TONSILLECTOMY AND ADENOIDECTOMY      REVIEW OF SYSTEMS:  A comprehensive review of systems was negative except for: Constitutional: positive for fatigue Respiratory: positive for dyspnea on exertion   PHYSICAL EXAMINATION: General appearance: alert, cooperative, fatigued, and no distress Head: Normocephalic, without obvious abnormality, atraumatic Neck: no adenopathy, no JVD, supple, symmetrical, trachea midline, and thyroid not enlarged, symmetric, no tenderness/mass/nodules Lymph nodes: Cervical, supraclavicular, and axillary nodes normal. Resp: clear to auscultation bilaterally Back: symmetric, no curvature. ROM normal. No CVA tenderness. Cardio: regular rate and rhythm, S1, S2 normal, no murmur, click, rub or gallop GI: soft, non-tender; bowel sounds normal; no masses,  no organomegaly Extremities: extremities normal, atraumatic, no cyanosis or edema  ECOG PERFORMANCE STATUS: 1 - Symptomatic but completely ambulatory  Blood pressure 113/66, pulse 72, temperature 97.6 F (36.4 C), temperature source Oral, resp. rate 18, weight 181 lb 2 oz (82.2 kg), SpO2 96  %.  LABORATORY DATA: Lab Results  Component Value Date   WBC 8.2 09/14/2021   HGB 11.7 (L) 09/14/2021   HCT 34.4 (L) 09/14/2021   MCV 97.2 09/14/2021   PLT 178 09/14/2021      Chemistry      Component Value Date/Time   NA 136 09/14/2021 1228   K 4.7 09/14/2021 1228   CL 102 09/14/2021 1228   CO2 31 09/14/2021 1228   BUN 19 09/14/2021 1228   CREATININE 1.04 09/14/2021 1228      Component Value Date/Time   CALCIUM 9.7 09/14/2021 1228   ALKPHOS 74 09/14/2021 1228   AST 20 09/14/2021 1228   ALT 18 09/14/2021 1228   BILITOT 0.4 09/14/2021 1228       RADIOGRAPHIC STUDIES: MR BRAIN W WO CONTRAST  Result Date: 10/03/2021 CLINICAL DATA:  History of lung and brain cancer. EXAM: MRI HEAD WITHOUT AND WITH CONTRAST TECHNIQUE: Multiplanar, multiecho pulse sequences of the brain and surrounding structures were obtained without and with intravenous contrast. CONTRAST:  72m MULTIHANCE GADOBENATE DIMEGLUMINE 529 MG/ML IV SOLN COMPARISON:  08/24/2021 FINDINGS: Brain: 2.8 cm centrally necrotic peripherally enhancing mass in the anterior left temporal lobe is unchanged in size and overall shape. The thickness of the peripheral enhancement is somewhat more prominent but measures similar. Regional vasogenic edema remains moderate. Unchanged punctate enhancement in the right cerebellum on 14:34, only seen on the source postcontrast thin section images. A right occipital lesion listed on prior is not confidently seen. No new lesion. Generalized atrophy. No acute hemorrhage, hydrocephalus, or infarct. Vascular: Major flow voids and vascular enhancements are preserved Skull and upper cervical spine: No focal lesion. Sinuses/Orbits: Negative IMPRESSION: 1. No new or progressive disease. 2. Unchanged 2.8 cm left temporal lobe lesion with central necrosis and moderate vasogenic edema. Electronically Signed   By: Jorje Guild M.D.   On: 10/03/2021 11:38    ASSESSMENT AND PLAN: This is a very pleasant 81  years old white male recently diagnosed with a stage IV (T1b, N2, M1a) non-small cell lung cancer, adenocarcinoma presented with left upper lobe lung nodule in addition to mediastinal lymphadenopathy and pleural-based metastasis as well as malignant left pleural effusion diagnosed in July 2022. His molecular studies by foundation 1 showed no actionable mutations. The patient is currently undergoing systemic chemotherapy with carboplatin for AUC of 5, Alimta 500 Mg/M2 and Keytruda 200 Mg IV every 3 weeks.  He is status post 18 cycles.  Starting from cycle #5 the patient will be on treatment with Alimta and Keytruda every 3 weeks. He underwent palliative radiotherapy to the enlarging left upper lobe lung mass by radiation oncology in [place]. The patient has been tolerating his maintenance treatment fairly well with no concerning adverse effects. I recommended for him to proceed with cycle #19 today as planned. I will see him back for follow-up visit in 3 weeks for evaluation with repeat CT scan of the chest, abdomen and pelvis for restaging of his disease. His MRI of the brain on 10/03/2021 showed no evidence of new or progressive disease in the brain.  He is followed by neurooncology and radiation oncology for his brain metastasis. The patient was advised to call immediately if he has any other concerning symptoms in the interval. The patient voices understanding of current disease status and treatment options and is in agreement with the current care plan.  All questions were answered. The patient knows to call the clinic with any problems, questions or concerns. We can certainly see the patient much sooner if necessary.  Disclaimer: This note was dictated with voice recognition software. Similar sounding words can inadvertently be transcribed and may not be corrected upon review.

## 2021-10-06 ENCOUNTER — Other Ambulatory Visit: Payer: Self-pay

## 2021-10-07 ENCOUNTER — Other Ambulatory Visit: Payer: Self-pay | Admitting: Radiation Therapy

## 2021-10-08 ENCOUNTER — Other Ambulatory Visit: Payer: Self-pay | Admitting: Radiation Therapy

## 2021-10-08 ENCOUNTER — Encounter: Payer: Self-pay | Admitting: Internal Medicine

## 2021-10-08 ENCOUNTER — Telehealth: Payer: Self-pay | Admitting: Internal Medicine

## 2021-10-08 NOTE — Progress Notes (Signed)
Patient called to inquire about [employer] grant and whether or not he could reapply. Provided him details. He was very appreciative and has my card for any additional financial questions or concerns.

## 2021-10-08 NOTE — Telephone Encounter (Signed)
Called patient regarding upcoming October, November and December appointments. Spoke to patient's spouse, patient is notified.

## 2021-10-09 ENCOUNTER — Other Ambulatory Visit: Payer: Self-pay

## 2021-10-11 ENCOUNTER — Other Ambulatory Visit: Payer: Self-pay

## 2021-10-13 ENCOUNTER — Other Ambulatory Visit: Payer: Medicare HMO

## 2021-10-13 ENCOUNTER — Ambulatory Visit: Payer: Medicare HMO

## 2021-10-13 ENCOUNTER — Ambulatory Visit: Payer: Medicare HMO | Admitting: Internal Medicine

## 2021-10-15 ENCOUNTER — Other Ambulatory Visit: Payer: Medicare HMO

## 2021-10-15 ENCOUNTER — Ambulatory Visit: Payer: Medicare HMO

## 2021-10-15 ENCOUNTER — Ambulatory Visit: Payer: Medicare HMO | Admitting: Internal Medicine

## 2021-10-15 ENCOUNTER — Other Ambulatory Visit: Payer: Self-pay | Admitting: Physician Assistant

## 2021-10-15 DIAGNOSIS — E039 Hypothyroidism, unspecified: Secondary | ICD-10-CM

## 2021-10-21 ENCOUNTER — Other Ambulatory Visit: Payer: Self-pay

## 2021-10-22 ENCOUNTER — Ambulatory Visit (HOSPITAL_COMMUNITY)
Admission: RE | Admit: 2021-10-22 | Discharge: 2021-10-22 | Disposition: A | Discharge status: Home / Self Care | Payer: Medicare HMO | Source: Ambulatory Visit | Attending: Internal Medicine | Admitting: Internal Medicine

## 2021-10-22 DIAGNOSIS — C3490 Malignant neoplasm of unspecified part of unspecified bronchus or lung: Secondary | ICD-10-CM | POA: Diagnosis present

## 2021-10-22 MED ORDER — IOHEXOL 300 MG/ML  SOLN
100.0000 mL | Freq: Once | INTRAMUSCULAR | Status: AC | PRN
  Administered 2021-10-22: 100 mL via INTRAVENOUS

## 2021-10-22 MED ORDER — SODIUM CHLORIDE (PF) 0.9 % IJ SOLN
INTRAMUSCULAR | Status: AC
  Filled 2021-10-22: qty 50

## 2021-10-22 MED ORDER — HEPARIN SOD (PORK) LOCK FLUSH 100 UNIT/ML IV SOLN
500.0000 [IU] | Freq: Once | INTRAVENOUS | Status: AC
  Administered 2021-10-22: 500 [IU] via INTRAVENOUS

## 2021-10-22 MED ORDER — HEPARIN SOD (PORK) LOCK FLUSH 100 UNIT/ML IV SOLN
INTRAVENOUS | Status: AC
  Filled 2021-10-22: qty 5

## 2021-10-23 NOTE — Progress Notes (Unsigned)
[facility] OFFICE PROGRESS NOTE  [name], MD [address] [place] [address]  DIAGNOSIS: Stage IV (T1b, N2, M1a) non-small cell lung cancer, adenocarcinoma diagnosed in July 2022 and presented with left upper lobe nodule in addition to AP window lymphadenopathy and left-sided malignant pleural effusion as well as pleural metastatic disease.  The patient also has solitary left temporal brain metastasis.   Biomarker Findings Microsatellite status - MS-Stable Tumor Mutational Burden - 4 Muts/Mb Genomic Findings For a complete list of the genes assayed, please refer to the Appendix. [id number] M578I PTEN splice site [id number] - subclonal? DOT1L S911L - subclonal? RAD21 S271* RB1 loss exons 3-23 TP53 N375f*34 8 Disease relevant genes with no reportable alterations: ALK, BRAF, EGFR, ERBB2, KRAS, MET, RET, ROS1  PRIOR THERAPY: 1)  SRS to the brain completed on 09/03/2020 2) [facility] Completed in June 2023  CURRENT THERAPY:  1) Systemic chemotherapy with carboplatin for AUC of 5, Alimta 500 Mg/M2 and Keytruda 200 Mg IV every 3 weeks.  First dose August 13, 2020.  Status post 19 cycles.  Starting from cycle #5 he is on maintenance treatment with Alimta and Keytruda every 3 weeks.  INTERVAL HISTORY: JDamir Leung81y.o. male returns to the clinic today for a follow-up visit accompanied by his wife.  The patient was last seen in clinic 3 weeks ago by Dr. MJulien Nordmann  In the interim since last being seen, the patient had a repeat brain MRI and follow-up with Dr. VMickeal Skinnerwhich showed stable treated left temporal metastases.  The patient's Decadron is being tapered and he is currently on 1 mg p.o. daily.  Since last being seen the patient denies any changes in his health.   The patient is currently undergoing immunotherapy with Keytruda and Alimta.  He is tolerating this  fair.  He denies any fever, chills, or night sweats.  His weight is stable and he is eating well due to the steroid. No significant extremity swelling. He denies any chest pain or hemoptysis.  He denies significant cough. [place] is prescribed supplemental oxygen at night. His oxygen is 95% on room air. He is wondering if his breathing will ever get better and if he will every not need the supplemental oxygen at night. He uses his incentive spirometer for his lung exercises. He also is compliant with his inhalers. His dyspnea is stable today. He denies any nausea, vomiting, diarrhea, or constipation. Denies any headache or visual changes.  Denies any rashes or skin changes.  He recently had a restaging CT scan performed.  He is here today for evaluation to review his scan results before starting cycle #20.    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 ICD10 Conversion    ALLERGIES:  has No Known Allergies.  MEDICATIONS:  Current Outpatient Medications  Medication Sig Dispense Refill   acetaminophen (TYLENOL) 500 MG tablet Take 500 mg by mouth every 6 (six) hours as needed for headache.     albuterol (VENTOLIN HFA) 108 (90  Base) MCG/ACT inhaler Inhale 2 puffs into the lungs every 4 (four) hours as needed for wheezing or shortness of breath.     aspirin 81 MG EC tablet Take 81 mg by mouth daily.     Bacillus Coagulans-Inulin (PROBIOTIC) 1-250 BILLION-MG CAPS Take 1 tablet by mouth daily.     bisacodyl (DULCOLAX) 5 MG EC tablet Take 5 mg by mouth 3 (three) times daily as needed for moderate  constipation.     Cholecalciferol (D3 PO) Take 1 tablet by mouth daily.     coal tar-salicylic acid 2 % shampoo Apply topically daily as needed for itching.     dexamethasone (DECADRON) 1 MG tablet Take 1 tablet (1 mg total) by mouth daily. 90 tablet 0   DULoxetine (CYMBALTA) 30 MG capsule Take 30 mg by mouth daily.     fexofenadine (ALLEGRA) 180 MG tablet Take 180 mg by mouth daily as needed for allergies.     fluocinonide cream (LIDEX) 8.67 % Apply 1 application  topically 2 (two) times daily as needed (psroasis).     folic acid (FOLVITE) 1 MG tablet TAKE 1 TABLET(1 MG) BY MOUTH DAILY 30 tablet 4   hydrocortisone cream 1 % Apply 1 application topically 2 (two) times daily. (Patient taking differently: Apply 1 application  topically 2 (two) times daily as needed (rash).) 453.6 g 0   ketoconazole (NIZORAL) 2 % cream Apply 1 application  topically daily as needed (rash).     Krill Oil (OMEGA-3) 500 MG CAPS Take 1 capsule by mouth daily.     levothyroxine (SYNTHROID) 25 MCG tablet TAKE 1 TABLET(25 MCG) BY MOUTH DAILY BEFORE BREAKFAST 30 tablet 2   lidocaine-prilocaine (EMLA) cream Apply 1 application topically as needed. (Patient taking differently: Apply 1 application  topically daily as needed (access port).) 30 g 1   Magnesium 500 MG TABS Take 1 tablet by mouth every evening.     Niacin (VITAMIN B-3 PO) Take 1 tablet by mouth daily.     omeprazole (PRILOSEC) 40 MG capsule Take 40 mg by mouth every evening.     tamsulosin (FLOMAX) 0.4 MG CAPS capsule Take 0.4 mg by mouth at bedtime.     No current facility-administered medications for this visit.   Facility-Administered Medications Ordered in Other Visits  Medication Dose Route Frequency Provider Last Rate Last Admin   heparin lock flush 100 unit/mL  500 Units Intracatheter Once PRN Curt Bears, MD       pembrolizumab [facility]) 200 mg in sodium chloride 0.9 % 50 mL chemo infusion  200 mg Intravenous Once Curt Bears, MD 116 mL/hr at  10/26/21 1208 200 mg at 10/26/21 1208   PEMEtrexed (ALIMTA) 1,000 mg in sodium chloride 0.9 % 100 mL chemo infusion  500 mg/m2 (Treatment Plan Recorded) Intravenous Once Curt Bears, MD       sodium chloride flush (NS) 0.9 % injection 10 mL  10 mL Intracatheter PRN Curt Bears, MD        SURGICAL HISTORY:  Past Surgical History:  Procedure Laterality Date   IR IMAGING GUIDED PORT INSERTION  11/24/2020   KNEE SURGERY     orthoscopic     TONSILLECTOMY AND ADENOIDECTOMY      REVIEW OF SYSTEMS:   Review of Systems  Constitutional: Positive for stable fatigue. Negative for appetite change, chills, fever and unexpected weight change.  HENT:   Negative for mouth sores, nosebleeds, sore throat and trouble swallowing.   Eyes: Negative for eye problems and icterus.  Respiratory: Positive for stable dyspnea on exertion. Negative for cough, hemoptysis, and wheezing.   Cardiovascular: Negative for chest pain and leg swelling.  Gastrointestinal: Negative for abdominal pain, constipation, diarrhea, nausea and vomiting.  Genitourinary: Negative for bladder incontinence, difficulty urinating, dysuria, frequency and hematuria.   Musculoskeletal: Negative for back pain, gait problem, neck pain and neck stiffness.  Skin: Negative for itching and rash.  Neurological: Negative for dizziness, extremity weakness, gait problem, headaches, light-headedness and seizures.  Hematological: Negative for adenopathy. Does not bruise/bleed easily.  Psychiatric/Behavioral: Negative for confusion, depression and sleep disturbance. The patient is not nervous/anxious.     PHYSICAL EXAMINATION:  Blood pressure 122/81, pulse 81, temperature 98 F (36.7 C), temperature source Oral, resp. rate 15, weight 186 lb 9.6 oz (84.6 kg), SpO2 95 %.  ECOG PERFORMANCE STATUS: 1  Physical Exam  Constitutional: Oriented to person, place, and time and well-developed, well-nourished, and in no distress.  HENT:  Head:  Normocephalic and atraumatic.  Mouth/Throat: Oropharynx is clear and moist. No oropharyngeal exudate.  Eyes: Conjunctivae are normal. Right eye exhibits no discharge. Left eye exhibits no discharge. No scleral icterus.  Neck: Normal range of motion. Neck supple.  Cardiovascular: Normal rate, regular rhythm, normal heart sounds and intact distal pulses.   Pulmonary/Chest: Effort normal and breath sounds normal. No respiratory distress. No wheezes. No rales.  Abdominal: Soft. Bowel sounds are normal. Exhibits no distension and no mass. There is no tenderness.  Musculoskeletal: Normal range of motion. Exhibits no edema.  Lymphadenopathy:    No cervical adenopathy.  Neurological: Alert and oriented to person, place, and time. Exhibits normal muscle tone. Gait normal. Coordination normal.  Skin: Skin is warm and dry. No rash noted. Not diaphoretic. No erythema. No pallor.  Psychiatric: Mood, memory and judgment normal.  Vitals reviewed.  LABORATORY DATA: Lab Results  Component Value Date   WBC 6.2 10/26/2021   HGB 10.4 (L) 10/26/2021   HCT 30.9 (L) 10/26/2021   MCV 96.9 10/26/2021   PLT 186 10/26/2021      Chemistry      Component Value Date/Time   NA 139 10/26/2021 0948   K 4.0 10/26/2021 0948   CL 105 10/26/2021 0948   CO2 29 10/26/2021 0948   BUN 13 10/26/2021 0948   CREATININE 0.94 10/26/2021 0948      Component Value Date/Time   CALCIUM 9.2 10/26/2021 0948   ALKPHOS 67 10/26/2021 0948   AST 21 10/26/2021 0948   ALT 15 10/26/2021 0948   BILITOT 0.4 10/26/2021 0948       RADIOGRAPHIC STUDIES:  CT Chest W Contrast  Result Date: 10/23/2021 CLINICAL DATA:  Stage IV left upper lobe lung adenocarcinoma diagnosed July 2022 with brain metastasis. History of palliative radiation therapy to the left upper lobe primary nodule. Status post chemotherapy with ongoing immunotherapy. Restaging. * Tracking Code: BO * EXAM: CT CHEST, ABDOMEN, AND PELVIS WITH CONTRAST TECHNIQUE:  Multidetector CT imaging of the chest, abdomen and pelvis was performed following the standard protocol during bolus administration of intravenous contrast. RADIATION DOSE REDUCTION: This exam was performed according to the departmental dose-optimization program which includes automated exposure control, adjustment of the mA and/or kV according to patient size and/or use of iterative reconstruction technique. CONTRAST:  119m OMNIPAQUE IOHEXOL 300 MG/ML  SOLN COMPARISON:  08/24/2021 chest CT angiogram. 07/16/2021 CT chest, abdomen and pelvis. FINDINGS: CT CHEST FINDINGS Cardiovascular: Normal heart size. No significant pericardial effusion/thickening. Three-vessel coronary atherosclerosis. Right internal jugular Port-A-Cath terminates in the lower  third of the SVC. Atherosclerotic nonaneurysmal thoracic aorta. Normal caliber pulmonary arteries. No central pulmonary emboli. Mediastinum/Nodes: No significant thyroid nodules. Unremarkable esophagus. No pathologically enlarged axillary, mediastinal or hilar lymph nodes. Lungs/Pleura: No pneumothorax. No pleural effusion. Sharply marginated band like left perihilar lung consolidation with associated volume loss and bronchiectasis, compatible with evolving postradiation change. Moderate patchy confluent subpleural reticulation and ground-glass opacity in both lungs with associated mild traction bronchiectasis, not appreciably changed. No new significant pulmonary nodules. Musculoskeletal: No aggressive appearing focal osseous lesions. Mild-to-moderate thoracic spondylosis. CT ABDOMEN PELVIS FINDINGS Hepatobiliary: Normal liver with no liver mass. Normal gallbladder with no radiopaque cholelithiasis. No biliary ductal dilatation. Pancreas: Normal, with no mass or duct dilation. Spleen: Normal size. No mass. Adrenals/Urinary Tract: Normal adrenals. Simple 2.4 cm anterior interpolar left renal cyst, with additional scattered subcentimeter hypodense bilateral renal cortical  lesions that are too small to characterize, for which no follow-up imaging is recommended. No hydronephrosis. Chronic mild diffuse bladder wall thickening. Stomach/Bowel: Normal non-distended stomach. Normal caliber small bowel with no small bowel wall thickening. Normal appendix. Oral contrast transits to the colon. Moderate to marked diffuse colonic diverticulosis with no large bowel wall thickening or significant pericolonic fat stranding. Vascular/Lymphatic: Atherosclerotic nonaneurysmal abdominal aorta. Patent portal, splenic, hepatic and renal veins. No pathologically enlarged lymph nodes in the abdomen or pelvis. Reproductive: Borderline mild prostatomegaly. Other: No pneumoperitoneum, ascites or focal fluid collection. Musculoskeletal: No aggressive appearing focal osseous lesions. 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 IMPRESSION: 1. Evolving postradiation change in the perihilar left lung, with no evidence of local tumor recurrence. 2. No evidence of recurrent metastatic disease in the chest, abdomen or pelvis. 3. 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 4. Chronic findings include: Three-vessel coronary atherosclerosis. Moderate to marked diffuse colonic diverticulosis. Chronic mild diffuse bladder wall thickening, unchanged and probably due to chronic bladder outlet obstruction by the mildly enlarged prostate. 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 Aortic Atherosclerosis (ICD10-I70.0). Electronically Signed   By: Ilona Sorrel M.D.   On: 10/23/2021 16:26   CT Abdomen Pelvis W Contrast  Result Date: 10/23/2021 CLINICAL DATA:  Stage IV left upper lobe lung adenocarcinoma diagnosed July 2022 with brain metastasis. History of palliative radiation therapy to the left upper lobe primary nodule. Status post  chemotherapy with ongoing immunotherapy. Restaging. * Tracking Code: BO * EXAM: CT CHEST, ABDOMEN, AND PELVIS WITH CONTRAST TECHNIQUE: Multidetector CT imaging of the chest, abdomen and pelvis was performed following the standard protocol during bolus administration of intravenous contrast. RADIATION DOSE REDUCTION: This exam was performed according to the departmental dose-optimization program which includes automated exposure control, adjustment of the mA and/or kV according to patient size and/or use of iterative reconstruction technique. CONTRAST:  120m OMNIPAQUE IOHEXOL 300 MG/ML  SOLN COMPARISON:  08/24/2021 chest CT angiogram. 07/16/2021 CT chest, abdomen and pelvis. FINDINGS: CT CHEST FINDINGS Cardiovascular: Normal heart size. No significant pericardial effusion/thickening. Three-vessel coronary atherosclerosis. Right internal jugular Port-A-Cath terminates in the lower third of the SVC. Atherosclerotic nonaneurysmal thoracic aorta. Normal caliber pulmonary arteries. No central pulmonary emboli. Mediastinum/Nodes: No significant thyroid nodules. Unremarkable esophagus. No pathologically enlarged axillary, mediastinal or hilar lymph nodes. Lungs/Pleura: No pneumothorax. No pleural effusion. Sharply marginated band like left perihilar lung consolidation with associated volume loss and bronchiectasis, compatible with evolving postradiation change. Moderate patchy confluent subpleural reticulation and ground-glass opacity in both lungs  with associated mild traction bronchiectasis, not appreciably changed. No new significant pulmonary nodules. Musculoskeletal: No aggressive appearing focal osseous lesions. Mild-to-moderate thoracic spondylosis. CT ABDOMEN PELVIS FINDINGS Hepatobiliary: Normal liver with no liver mass. Normal gallbladder with no radiopaque cholelithiasis. No biliary ductal dilatation. Pancreas: Normal, with no mass or duct dilation. Spleen: Normal size. No mass. Adrenals/Urinary Tract: Normal  adrenals. Simple 2.4 cm anterior interpolar left renal cyst, with additional scattered subcentimeter hypodense bilateral renal cortical lesions that are too small to characterize, for which no follow-up imaging is recommended. No hydronephrosis. Chronic mild diffuse bladder wall thickening. Stomach/Bowel: Normal non-distended stomach. Normal caliber small bowel with no small bowel wall thickening. Normal appendix. Oral contrast transits to the colon. Moderate to marked diffuse colonic diverticulosis with no large bowel wall thickening or significant pericolonic fat stranding. Vascular/Lymphatic: Atherosclerotic nonaneurysmal abdominal aorta. Patent portal, splenic, hepatic and renal veins. No pathologically enlarged lymph nodes in the abdomen or pelvis. Reproductive: Borderline mild prostatomegaly. Other: No pneumoperitoneum, ascites or focal fluid collection. Musculoskeletal: No aggressive appearing focal osseous lesions. 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 IMPRESSION: 1. Evolving postradiation change in the perihilar left lung, with no evidence of local tumor recurrence. 2. No evidence of recurrent metastatic disease in the chest, abdomen or pelvis. 3. 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 4. Chronic findings include: Three-vessel coronary atherosclerosis. Moderate to marked diffuse colonic diverticulosis. Chronic mild diffuse bladder wall thickening, unchanged and probably due to chronic bladder outlet obstruction by the mildly enlarged prostate. 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 Aortic Atherosclerosis (ICD10-I70.0). Electronically Signed   By: Ilona Sorrel M.D.   On: 10/23/2021 16:26   MR BRAIN W WO CONTRAST  Result Date: 10/03/2021 CLINICAL DATA:  History of lung and brain cancer. EXAM: MRI HEAD WITHOUT AND  WITH CONTRAST TECHNIQUE: Multiplanar, multiecho pulse sequences of the brain and surrounding structures were obtained without and with intravenous contrast. CONTRAST:  45m MULTIHANCE GADOBENATE DIMEGLUMINE 529 MG/ML IV SOLN COMPARISON:  08/24/2021 FINDINGS: Brain: 2.8 cm centrally necrotic peripherally enhancing mass in the anterior left temporal lobe is unchanged in size and overall shape. The thickness of the peripheral enhancement is somewhat more prominent but measures similar. Regional vasogenic edema remains moderate. Unchanged punctate enhancement in the right cerebellum on 14:34, only seen on the source postcontrast thin section images. A right occipital lesion listed on prior is not confidently seen. No new lesion. Generalized atrophy. No acute hemorrhage, hydrocephalus, or infarct. Vascular: Major flow voids and vascular enhancements are preserved Skull and upper cervical spine: No focal lesion. Sinuses/Orbits: Negative IMPRESSION: 1. No new or progressive disease. 2. Unchanged 2.8 cm left temporal lobe lesion with central necrosis and moderate vasogenic edema. Electronically Signed   By: JJorje GuildM.D.   On: 10/03/2021 11:38     ASSESSMENT/PLAN:  This is a very pleasant 81year old Caucasian male diagnosed with stage IV (T1b, N2, M1A) non-small cell lung cancer, adenocarcinoma.  The patient presented with a left upper lobe lung nodule in addition to mediastinal lymphadenopathy and pleural-based metastasis as well as a malignant left pleural effusion.  The patient was diagnosed in July 2022.  The patient also had a metastatic brain lesion.   The patient completed SRS to the brain lesion on 09/03/2020 under the care of Dr. MLisbeth Renshaw   The patient is currently undergoing systemic chemotherapy with carboplatin for an AUC  of 5, Alimta 500 mg per metered squared, Keytruda 200 mg IV every 3 weeks.  The patient is status post 19 cycles.  Starting from cycle #5, the patient started maintenance Alimta  and Keytruda  On the patient's restaging CT scan from 04/23/2021, the patient had an enlarging left upper lobe nodule.  The patient completed SBRT to this lesion in [place].  This was completed in June 2023   The patient is also being followed by radiation oncology neurooncology regarding a solitary brain metastasis.  He is followed closely by Dr. Mickeal Skinner who saw him earlier this month. The patient is also followed by pulmonology in [facility].   Patient recently had a restaging CT scan performed.  Dr. Julien Nordmann personally and independently reviewed the scan and discussed the results with the patient today.  The scan shows no evidence of disease progression.  Dr. Julien Nordmann recommends that the patient continue on the same treatment at the same dose.  He will proceed with cycle #20 today as scheduled.  Regarding the patient's question about his oxygen, discussed this is a better question for his pulmonologist.  Did discuss with the patient that he does have chronic changes in his lung related to fibrotic interstitial lung disease and postradiation changes which is likely going to be chronic, so it is unlikely that this will improve.  Overall his oxygen is 95% on room air today and he states that breathing is at his baseline.  He is just curious if he will always need supplemental oxygen at night.  We will see him back for follow-up visit in 3 weeks for evaluation and repeat blood work before undergoing cycle #21.  We will continue to monitor the patient's thyroid function and anemia.  His thyroid labs are pending at this time.  The patient was advised to call immediately if he has any concerning symptoms in the interval. The patient voices understanding of current disease status and treatment options and is in agreement with the current care plan. All questions were answered. The patient knows to call the clinic with any problems, questions or concerns. We can certainly see the patient much  sooner if necessary               Orders Placed This Encounter  Procedures   CBC with Differential ([place] Only)    Standing Status:   Future    Standing Expiration Date:   12/08/2022   CMP ([place] only)    Standing Status:   Future    Standing Expiration Date:   12/08/2022   CBC with Differential ([place] Only)    Standing Status:   Future    Standing Expiration Date:   12/31/2022   CMP ([place] only)    Standing Status:   Future    Standing Expiration Date:   12/31/2022   CBC with Differential ([place] Only)    Standing Status:   Future    Standing Expiration Date:   01/21/2023   CMP ([place] only)    Standing Status:   Future    Standing Expiration Date:   01/21/2023   T4    Standing Status:   Future    Standing Expiration Date:   01/21/2023   TSH    Standing Status:   Future    Standing Expiration Date:   01/21/2023      Baylea Milburn L Corleen Otwell, PA-C 10/26/21   ADDENDUM: Hematology/Oncology Attending: I had a face-to-face encounter with the patient today.  I reviewed his record, lab, scan and recommended his care plan.  This is a very pleasant 81 years old white male with a stage IV non-small cell lung cancer, adenocarcinoma diagnosed in July 2022 status post SRS to brain metastasis followed by systemic chemotherapy with carboplatin, Alimta and Keytruda for 4 cycles followed by maintenance treatment with Alimta and Keytruda for a total of 15 more cycles.  The patient has been tolerating this treatment well with no concerning adverse effects. He had repeat CT scan of the chest, abdomen and pelvis performed recently.  I personally and independently reviewed the scan and discussed the result with the patient and his wife. His scan showed no concerning findings for disease progression. I recommended for the patient to proceed with cycle #20 of his treatment today as planned. We will see the patient back for follow-up visit in 3  weeks for evaluation before the next cycle of his treatment. The patient was advised to call immediately if he has any other concerning symptoms in the interval. The total time spent in the appointment was 30 minutes. Disclaimer: This note was dictated with voice recognition software. Similar sounding words can inadvertently be transcribed and may be missed upon review. Eilleen Kempf, MD

## 2021-10-26 ENCOUNTER — Inpatient Hospital Stay: Payer: Medicare HMO

## 2021-10-26 ENCOUNTER — Inpatient Hospital Stay (HOSPITAL_BASED_OUTPATIENT_CLINIC_OR_DEPARTMENT_OTHER): Payer: Medicare HMO | Admitting: Physician Assistant

## 2021-10-26 ENCOUNTER — Other Ambulatory Visit: Payer: Self-pay

## 2021-10-26 VITALS — BP 122/81 | HR 81 | Temp 98.0°F | Resp 15 | Wt 186.6 lb

## 2021-10-26 VITALS — BP 128/79 | HR 90 | Temp 97.7°F | Resp 17

## 2021-10-26 DIAGNOSIS — C3492 Malignant neoplasm of unspecified part of left bronchus or lung: Secondary | ICD-10-CM

## 2021-10-26 DIAGNOSIS — Z5112 Encounter for antineoplastic immunotherapy: Secondary | ICD-10-CM

## 2021-10-26 DIAGNOSIS — Z5111 Encounter for antineoplastic chemotherapy: Secondary | ICD-10-CM | POA: Diagnosis not present

## 2021-10-26 DIAGNOSIS — Z95828 Presence of other vascular implants and grafts: Secondary | ICD-10-CM

## 2021-10-26 LAB — CBC WITH DIFFERENTIAL (CANCER CENTER ONLY)
Abs Immature Granulocytes: 0.07 10*3/uL (ref 0.00–0.07)
Basophils Absolute: 0 10*3/uL (ref 0.0–0.1)
Basophils Relative: 1 %
Eosinophils Absolute: 0.2 10*3/uL (ref 0.0–0.5)
Eosinophils Relative: 3 %
HCT: 30.9 % — ABNORMAL LOW (ref 39.0–52.0)
Hemoglobin: 10.4 g/dL — ABNORMAL LOW (ref 13.0–17.0)
Immature Granulocytes: 1 %
Lymphocytes Relative: 21 %
Lymphs Abs: 1.3 10*3/uL (ref 0.7–4.0)
MCH: 32.6 pg (ref 26.0–34.0)
MCHC: 33.7 g/dL (ref 30.0–36.0)
MCV: 96.9 fL (ref 80.0–100.0)
Monocytes Absolute: 0.9 10*3/uL (ref 0.1–1.0)
Monocytes Relative: 14 %
Neutro Abs: 3.7 10*3/uL (ref 1.7–7.7)
Neutrophils Relative %: 60 %
Platelet Count: 186 10*3/uL (ref 150–400)
RBC: 3.19 MIL/uL — ABNORMAL LOW (ref 4.22–5.81)
RDW: 16.6 % — ABNORMAL HIGH (ref 11.5–15.5)
WBC Count: 6.2 10*3/uL (ref 4.0–10.5)
nRBC: 0 % (ref 0.0–0.2)

## 2021-10-26 LAB — CMP (CANCER CENTER ONLY)
ALT: 15 U/L (ref 0–44)
AST: 21 U/L (ref 15–41)
Albumin: 3.7 g/dL (ref 3.5–5.0)
Alkaline Phosphatase: 67 U/L (ref 38–126)
Anion gap: 5 (ref 5–15)
BUN: 13 mg/dL (ref 8–23)
CO2: 29 mmol/L (ref 22–32)
Calcium: 9.2 mg/dL (ref 8.9–10.3)
Chloride: 105 mmol/L (ref 98–111)
Creatinine: 0.94 mg/dL (ref 0.61–1.24)
GFR, Estimated: >60 mL/min (ref >60)
Glucose, Bld: 85 mg/dL (ref 70–99)
Potassium: 4 mmol/L (ref 3.5–5.1)
Sodium: 139 mmol/L (ref 135–145)
Total Bilirubin: 0.4 mg/dL (ref 0.3–1.2)
Total Protein: 6.3 g/dL — ABNORMAL LOW (ref 6.5–8.1)

## 2021-10-26 LAB — TSH: TSH: 5.179 u[IU]/mL — ABNORMAL HIGH (ref 0.350–4.500)

## 2021-10-26 MED ORDER — SODIUM CHLORIDE 0.9 % IV SOLN: Freq: Once | INTRAVENOUS | Status: AC

## 2021-10-26 MED ORDER — SODIUM CHLORIDE 0.9% FLUSH
10.0000 mL | INTRAVENOUS | Status: DC | PRN
  Administered 2021-10-26: 10 mL

## 2021-10-26 MED ORDER — SODIUM CHLORIDE 0.9 % IV SOLN
500.0000 mg/m2 | Freq: Once | INTRAVENOUS | Status: AC
  Administered 2021-10-26: 1000 mg via INTRAVENOUS
  Filled 2021-10-26: qty 40

## 2021-10-26 MED ORDER — PROCHLORPERAZINE MALEATE 10 MG PO TABS
10.0000 mg | ORAL_TABLET | Freq: Once | ORAL | Status: AC
  Administered 2021-10-26: 10 mg via ORAL
  Filled 2021-10-26: qty 1

## 2021-10-26 MED ORDER — SODIUM CHLORIDE 0.9 % IV SOLN
200.0000 mg | Freq: Once | INTRAVENOUS | Status: AC
  Administered 2021-10-26: 200 mg via INTRAVENOUS
  Filled 2021-10-26: qty 200

## 2021-10-26 MED ORDER — SODIUM CHLORIDE 0.9% FLUSH
10.0000 mL | Freq: Once | INTRAVENOUS | Status: AC
  Administered 2021-10-26: 10 mL

## 2021-10-26 MED ORDER — HEPARIN SOD (PORK) LOCK FLUSH 100 UNIT/ML IV SOLN
500.0000 [IU] | Freq: Once | INTRAVENOUS | Status: AC | PRN
  Administered 2021-10-26: 500 [IU]

## 2021-10-26 NOTE — Patient Instructions (Signed)
[facility]  Discharge Instructions: Thank you for choosing [facility] to provide your oncology and hematology care.   If you have a lab appointment with the [facility], please go directly to the [facility] and check in at the registration area.   Wear comfortable clothing and clothing appropriate for easy access to any Portacath or PICC line.   We strive to give you quality time with your provider. You may need to reschedule your appointment if you arrive late (15 or more minutes).  Arriving late affects you and other patients whose appointments are after yours.  Also, if you miss three or more appointments without notifying the office, you may be dismissed from the clinic at the provider's discretion.      For prescription refill requests, have your pharmacy contact our office and allow 72 hours for refills to be completed.    Today you received the following chemotherapy and/or immunotherapy agents: Keytruda/Alimta     To help prevent nausea and vomiting after your treatment, we encourage you to take your nausea medication as directed.  BELOW ARE SYMPTOMS THAT SHOULD BE REPORTED IMMEDIATELY: *FEVER GREATER THAN 100.4 F (38 C) OR HIGHER *CHILLS OR SWEATING *NAUSEA AND VOMITING THAT IS NOT CONTROLLED WITH YOUR NAUSEA MEDICATION *UNUSUAL SHORTNESS OF BREATH *UNUSUAL BRUISING OR BLEEDING *URINARY PROBLEMS (pain or burning when urinating, or frequent urination) *BOWEL PROBLEMS (unusual diarrhea, constipation, pain near the anus) TENDERNESS IN MOUTH AND THROAT WITH OR WITHOUT PRESENCE OF ULCERS (sore throat, sores in mouth, or a toothache) UNUSUAL RASH, SWELLING OR PAIN  UNUSUAL VAGINAL DISCHARGE OR ITCHING   Items with * indicate a potential emergency and should be followed up as soon as possible or go to the Emergency Department if any problems should occur.  Please show the CHEMOTHERAPY ALERT CARD or IMMUNOTHERAPY ALERT CARD at  check-in to the Emergency Department and triage nurse.  Should you have questions after your visit or need to cancel or reschedule your appointment, please contact [facility]  Dept: [phone]  and follow the prompts.  Office hours are 8:00 a.m. to 4:30 p.m. Monday - Friday. Please note that voicemails left after 4:00 p.m. may not be returned until the following business day.  We are closed weekends and major holidays. You have access to a nurse at all times for urgent questions. Please call the main number to the clinic Dept: [phone] and follow the prompts.   For any non-urgent questions, you may also contact your provider using MyChart. We now offer e-Visits for anyone 84 and older to request care online for non-urgent symptoms. For details visit [email or web address].   Also download the MyChart app! Go to the app store, search "MyChart", open the app, select [facility], and log in with your MyChart username and password.  Masks are optional in the cancer centers. If you would like for your care team to wear a mask while they are taking care of you, please let them know. You may have one support person who is at least 80 years old accompany you for your appointments.

## 2021-11-03 ENCOUNTER — Ambulatory Visit: Payer: Medicare HMO

## 2021-11-03 ENCOUNTER — Ambulatory Visit: Payer: Medicare HMO | Admitting: Physician Assistant

## 2021-11-03 ENCOUNTER — Other Ambulatory Visit: Payer: Medicare HMO

## 2021-11-05 ENCOUNTER — Ambulatory Visit: Payer: Medicare HMO | Admitting: Physician Assistant

## 2021-11-05 ENCOUNTER — Other Ambulatory Visit: Payer: Medicare HMO

## 2021-11-05 ENCOUNTER — Ambulatory Visit: Payer: Medicare HMO

## 2021-11-13 ENCOUNTER — Other Ambulatory Visit: Payer: Self-pay

## 2021-11-13 DIAGNOSIS — C3492 Malignant neoplasm of unspecified part of left bronchus or lung: Secondary | ICD-10-CM

## 2021-11-16 ENCOUNTER — Inpatient Hospital Stay: Payer: Medicare HMO

## 2021-11-16 ENCOUNTER — Inpatient Hospital Stay: Payer: Medicare HMO | Attending: Internal Medicine | Admitting: Internal Medicine

## 2021-11-16 ENCOUNTER — Encounter: Payer: Self-pay | Admitting: Internal Medicine

## 2021-11-16 VITALS — BP 122/67 | HR 76 | Temp 98.5°F | Resp 17 | Wt 190.2 lb

## 2021-11-16 VITALS — BP 123/77 | HR 70

## 2021-11-16 DIAGNOSIS — Z5111 Encounter for antineoplastic chemotherapy: Secondary | ICD-10-CM | POA: Diagnosis not present

## 2021-11-16 DIAGNOSIS — Z5112 Encounter for antineoplastic immunotherapy: Secondary | ICD-10-CM | POA: Diagnosis not present

## 2021-11-16 DIAGNOSIS — J910 Malignant pleural effusion: Secondary | ICD-10-CM | POA: Insufficient documentation

## 2021-11-16 DIAGNOSIS — Z95828 Presence of other vascular implants and grafts: Secondary | ICD-10-CM

## 2021-11-16 DIAGNOSIS — C7931 Secondary malignant neoplasm of brain: Secondary | ICD-10-CM | POA: Diagnosis not present

## 2021-11-16 DIAGNOSIS — C3492 Malignant neoplasm of unspecified part of left bronchus or lung: Secondary | ICD-10-CM

## 2021-11-16 DIAGNOSIS — C3412 Malignant neoplasm of upper lobe, left bronchus or lung: Secondary | ICD-10-CM | POA: Insufficient documentation

## 2021-11-16 DIAGNOSIS — Z79899 Other long term (current) drug therapy: Secondary | ICD-10-CM | POA: Diagnosis not present

## 2021-11-16 DIAGNOSIS — C782 Secondary malignant neoplasm of pleura: Secondary | ICD-10-CM | POA: Insufficient documentation

## 2021-11-16 LAB — CBC WITH DIFFERENTIAL (CANCER CENTER ONLY)
Abs Immature Granulocytes: 0.04 10*3/uL (ref 0.00–0.07)
Basophils Absolute: 0 10*3/uL (ref 0.0–0.1)
Basophils Relative: 1 %
Eosinophils Absolute: 0.2 10*3/uL (ref 0.0–0.5)
Eosinophils Relative: 3 %
HCT: 29.7 % — ABNORMAL LOW (ref 39.0–52.0)
Hemoglobin: 9.9 g/dL — ABNORMAL LOW (ref 13.0–17.0)
Immature Granulocytes: 1 %
Lymphocytes Relative: 21 %
Lymphs Abs: 1.4 10*3/uL (ref 0.7–4.0)
MCH: 33.2 pg (ref 26.0–34.0)
MCHC: 33.3 g/dL (ref 30.0–36.0)
MCV: 99.7 fL (ref 80.0–100.0)
Monocytes Absolute: 0.9 10*3/uL (ref 0.1–1.0)
Monocytes Relative: 13 %
Neutro Abs: 4.1 10*3/uL (ref 1.7–7.7)
Neutrophils Relative %: 61 %
Platelet Count: 213 10*3/uL (ref 150–400)
RBC: 2.98 MIL/uL — ABNORMAL LOW (ref 4.22–5.81)
RDW: 17.7 % — ABNORMAL HIGH (ref 11.5–15.5)
WBC Count: 6.7 10*3/uL (ref 4.0–10.5)
nRBC: 0 % (ref 0.0–0.2)

## 2021-11-16 LAB — CMP (CANCER CENTER ONLY)
ALT: 11 U/L (ref 0–44)
AST: 20 U/L (ref 15–41)
Albumin: 3.9 g/dL (ref 3.5–5.0)
Alkaline Phosphatase: 56 U/L (ref 38–126)
Anion gap: 6 (ref 5–15)
BUN: 14 mg/dL (ref 8–23)
CO2: 28 mmol/L (ref 22–32)
Calcium: 9.3 mg/dL (ref 8.9–10.3)
Chloride: 104 mmol/L (ref 98–111)
Creatinine: 0.97 mg/dL (ref 0.61–1.24)
GFR, Estimated: >60 mL/min (ref >60)
Glucose, Bld: 91 mg/dL (ref 70–99)
Potassium: 4.2 mmol/L (ref 3.5–5.1)
Sodium: 138 mmol/L (ref 135–145)
Total Bilirubin: 0.4 mg/dL (ref 0.3–1.2)
Total Protein: 6.6 g/dL (ref 6.5–8.1)

## 2021-11-16 LAB — TSH: TSH: 3.688 u[IU]/mL (ref 0.350–4.500)

## 2021-11-16 MED ORDER — SODIUM CHLORIDE 0.9 % IV SOLN: Freq: Once | INTRAVENOUS | Status: AC

## 2021-11-16 MED ORDER — CYANOCOBALAMIN 1000 MCG/ML IJ SOLN
1000.0000 ug | Freq: Once | INTRAMUSCULAR | Status: AC
  Administered 2021-11-16: 1000 ug via INTRAMUSCULAR
  Filled 2021-11-16: qty 1

## 2021-11-16 MED ORDER — SODIUM CHLORIDE 0.9 % IV SOLN
500.0000 mg/m2 | Freq: Once | INTRAVENOUS | Status: AC
  Administered 2021-11-16: 1000 mg via INTRAVENOUS
  Filled 2021-11-16: qty 40

## 2021-11-16 MED ORDER — SODIUM CHLORIDE 0.9 % IV SOLN
200.0000 mg | Freq: Once | INTRAVENOUS | Status: AC
  Administered 2021-11-16: 200 mg via INTRAVENOUS
  Filled 2021-11-16: qty 8

## 2021-11-16 MED ORDER — SODIUM CHLORIDE 0.9% FLUSH
10.0000 mL | INTRAVENOUS | Status: DC | PRN
  Administered 2021-11-16: 10 mL

## 2021-11-16 MED ORDER — PROCHLORPERAZINE MALEATE 10 MG PO TABS
10.0000 mg | ORAL_TABLET | Freq: Once | ORAL | Status: AC
  Administered 2021-11-16: 10 mg via ORAL
  Filled 2021-11-16: qty 1

## 2021-11-16 MED ORDER — SODIUM CHLORIDE 0.9% FLUSH
10.0000 mL | Freq: Once | INTRAVENOUS | Status: AC
  Administered 2021-11-16: 10 mL

## 2021-11-16 MED ORDER — HEPARIN SOD (PORK) LOCK FLUSH 100 UNIT/ML IV SOLN
500.0000 [IU] | Freq: Once | INTRAVENOUS | Status: AC | PRN
  Administered 2021-11-16: 500 [IU]

## 2021-11-16 NOTE — Progress Notes (Signed)
[facility] Telephone:([phone]   Fax:([phone]  OFFICE PROGRESS NOTE  [name], MD [address] [place] [address]  DIAGNOSIS: Stage IV (T1b, N2, M1a) non-small cell lung cancer, adenocarcinoma diagnosed in July 2022 and presented with left upper lobe nodule in addition to AP window lymphadenopathy and left-sided malignant pleural effusion as well as pleural metastatic disease.  The patient also has solitary left temporal brain metastasis.  Biomarker Findings Microsatellite status - MS-Stable Tumor Mutational Burden - 4 Muts/Mb Genomic Findings For a complete list of the genes assayed, please refer to the Appendix. OIN8MV E720N PTEN splice site [id number]>G - subclonal? DOT1L S911L - subclonal? RAD21 S271* RB1 loss exons 3-23 TP53 N389f*34 8 Disease relevant genes with no reportable alterations: ALK, BRAF, EGFR, ERBB2, KRAS, MET, RET, ROS1  PRIOR THERAPY: [facility]  CURRENT THERAPY: Systemic chemotherapy with carboplatin for AUC of 5, Alimta 500 Mg/M2 and Keytruda 200 Mg IV every 3 weeks.  First dose August 13, 2020.  Status post 20 cycles.  Starting from cycle #5 he is on maintenance treatment with Alimta and Keytruda every 3 weeks.  INTERVAL HISTORY: Peter Korinek81y.o. male returns to the clinic today for follow-up visit.  The patient is feeling fine today with no concerning complaints except for the swelling of the face and abdomen since he has been on Decadron 1 mg p.o. daily for the brain metastasis.  He denied having any current shortness of breath but has intermittent chest pain under the left breast.  He has no cough or hemoptysis.  He has no nausea, vomiting, diarrhea or constipation.  He has no headache or visual changes.  He is here today for evaluation before starting cycle #21 of his treatment.   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 ICD10 Conversion    ALLERGIES:  has No Known Allergies.  MEDICATIONS:  Current Outpatient Medications  Medication Sig Dispense Refill   acetaminophen (TYLENOL) 500 MG tablet Take 500 mg by mouth every 6 (six) hours as needed for headache.     albuterol (VENTOLIN HFA) 108 (90 Base) MCG/ACT inhaler Inhale 2 puffs into the lungs every 4 (four) hours as needed for wheezing or shortness of breath.     aspirin 81 MG EC tablet Take 81 mg by mouth daily.     Bacillus Coagulans-Inulin (PROBIOTIC) 1-250 BILLION-MG CAPS Take 1 tablet by mouth daily.     bisacodyl (DULCOLAX) 5 MG EC tablet Take 5 mg by mouth 3 (three) times daily as needed for moderate constipation.     Cholecalciferol (D3 PO) Take 1 tablet by mouth daily.     coal tar-salicylic acid 2 % shampoo Apply topically daily as needed for itching.     dexamethasone (DECADRON) 1 MG tablet Take 1 tablet (1 mg total) by mouth daily. 90 tablet 0   DULoxetine (CYMBALTA) 30 MG capsule Take 30 mg by mouth daily.     fexofenadine (ALLEGRA) 180 MG tablet Take 180 mg by mouth daily as needed for allergies.  fluocinonide cream (LIDEX) 0.03 % Apply 1 application  topically 2 (two) times daily as needed (psroasis).     folic acid (FOLVITE) 1 MG tablet TAKE 1 TABLET(1 MG) BY MOUTH DAILY 30 tablet 4   hydrocortisone cream 1 % Apply 1 application topically 2 (two) times daily. (Patient taking differently: Apply 1 application  topically 2 (two) times daily as needed (rash).) 453.6 g 0   ketoconazole (NIZORAL) 2 % cream Apply 1 application  topically daily as needed (rash).      Krill Oil (OMEGA-3) 500 MG CAPS Take 1 capsule by mouth daily.     levothyroxine (SYNTHROID) 25 MCG tablet TAKE 1 TABLET(25 MCG) BY MOUTH DAILY BEFORE BREAKFAST 30 tablet 2   lidocaine-prilocaine (EMLA) cream Apply 1 application topically as needed. (Patient taking differently: Apply 1 application  topically daily as needed (access port).) 30 g 1   Magnesium 500 MG TABS Take 1 tablet by mouth every evening.     Niacin (VITAMIN B-3 PO) Take 1 tablet by mouth daily.     omeprazole (PRILOSEC) 40 MG capsule Take 40 mg by mouth every evening.     tamsulosin (FLOMAX) 0.4 MG CAPS capsule Take 0.4 mg by mouth at bedtime.     No current facility-administered medications for this visit.    SURGICAL HISTORY:  Past Surgical History:  Procedure Laterality Date   IR IMAGING GUIDED PORT INSERTION  11/24/2020   KNEE SURGERY     orthoscopic     TONSILLECTOMY AND ADENOIDECTOMY      REVIEW OF SYSTEMS:  A comprehensive review of systems was negative except for: Constitutional: positive for fatigue Respiratory: positive for pleurisy/chest pain   PHYSICAL EXAMINATION: General appearance: alert, cooperative, fatigued, and no distress Head: Normocephalic, without obvious abnormality, atraumatic Neck: no adenopathy, no JVD, supple, symmetrical, trachea midline, and thyroid not enlarged, symmetric, no tenderness/mass/nodules Lymph nodes: Cervical, supraclavicular, and axillary nodes normal. Resp: clear to auscultation bilaterally Back: symmetric, no curvature. ROM normal. No CVA tenderness. Cardio: regular rate and rhythm, S1, S2 normal, no murmur, click, rub or gallop GI: soft, non-tender; bowel sounds normal; no masses,  no organomegaly Extremities: extremities normal, atraumatic, no cyanosis or edema  ECOG PERFORMANCE STATUS: 1 - Symptomatic but completely ambulatory  Blood pressure 122/67, pulse 76, temperature 98.5 F (36.9 C), temperature source Oral, resp. rate 17, weight 190 lb 4 oz (86.3 kg),  SpO2 92 %.  LABORATORY DATA: Lab Results  Component Value Date   WBC 6.7 11/16/2021   HGB 9.9 (L) 11/16/2021   HCT 29.7 (L) 11/16/2021   MCV 99.7 11/16/2021   PLT 213 11/16/2021      Chemistry      Component Value Date/Time   NA 139 10/26/2021 0948   K 4.0 10/26/2021 0948   CL 105 10/26/2021 0948   CO2 29 10/26/2021 0948   BUN 13 10/26/2021 0948   CREATININE 0.94 10/26/2021 0948      Component Value Date/Time   CALCIUM 9.2 10/26/2021 0948   ALKPHOS 67 10/26/2021 0948   AST 21 10/26/2021 0948   ALT 15 10/26/2021 0948   BILITOT 0.4 10/26/2021 0948       RADIOGRAPHIC STUDIES: CT Chest W Contrast  Result Date: 10/23/2021 CLINICAL DATA:  Stage IV left upper lobe lung adenocarcinoma diagnosed July 2022 with brain metastasis. History of palliative radiation therapy to the left upper lobe primary nodule. Status post chemotherapy with ongoing immunotherapy. Restaging. * Tracking Code: BO * EXAM: CT CHEST, ABDOMEN, AND PELVIS WITH  CONTRAST TECHNIQUE: Multidetector CT imaging of the chest, abdomen and pelvis was performed following the standard protocol during bolus administration of intravenous contrast. RADIATION DOSE REDUCTION: This exam was performed according to the departmental dose-optimization program which includes automated exposure control, adjustment of the mA and/or kV according to patient size and/or use of iterative reconstruction technique. CONTRAST:  112m OMNIPAQUE IOHEXOL 300 MG/ML  SOLN COMPARISON:  08/24/2021 chest CT angiogram. 07/16/2021 CT chest, abdomen and pelvis. FINDINGS: CT CHEST FINDINGS Cardiovascular: Normal heart size. No significant pericardial effusion/thickening. Three-vessel coronary atherosclerosis. Right internal jugular Port-A-Cath terminates in the lower third of the SVC. Atherosclerotic nonaneurysmal thoracic aorta. Normal caliber pulmonary arteries. No central pulmonary emboli. Mediastinum/Nodes: No significant thyroid nodules. Unremarkable  esophagus. No pathologically enlarged axillary, mediastinal or hilar lymph nodes. Lungs/Pleura: No pneumothorax. No pleural effusion. Sharply marginated band like left perihilar lung consolidation with associated volume loss and bronchiectasis, compatible with evolving postradiation change. Moderate patchy confluent subpleural reticulation and ground-glass opacity in both lungs with associated mild traction bronchiectasis, not appreciably changed. No new significant pulmonary nodules. Musculoskeletal: No aggressive appearing focal osseous lesions. Mild-to-moderate thoracic spondylosis. CT ABDOMEN PELVIS FINDINGS Hepatobiliary: Normal liver with no liver mass. Normal gallbladder with no radiopaque cholelithiasis. No biliary ductal dilatation. Pancreas: Normal, with no mass or duct dilation. Spleen: Normal size. No mass. Adrenals/Urinary Tract: Normal adrenals. Simple 2.4 cm anterior interpolar left renal cyst, with additional scattered subcentimeter hypodense bilateral renal cortical lesions that are too small to characterize, for which no follow-up imaging is recommended. No hydronephrosis. Chronic mild diffuse bladder wall thickening. Stomach/Bowel: Normal non-distended stomach. Normal caliber small bowel with no small bowel wall thickening. Normal appendix. Oral contrast transits to the colon. Moderate to marked diffuse colonic diverticulosis with no large bowel wall thickening or significant pericolonic fat stranding. Vascular/Lymphatic: Atherosclerotic nonaneurysmal abdominal aorta. Patent portal, splenic, hepatic and renal veins. No pathologically enlarged lymph nodes in the abdomen or pelvis. Reproductive: Borderline mild prostatomegaly. Other: No pneumoperitoneum, ascites or focal fluid collection. Musculoskeletal: No aggressive appearing focal osseous lesions. 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 IMPRESSION: 1. Evolving postradiation  change in the perihilar left lung, with no evidence of local tumor recurrence. 2. No evidence of recurrent metastatic disease in the chest, abdomen or pelvis. 3. 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 4. Chronic findings include: Three-vessel coronary atherosclerosis. Moderate to marked diffuse colonic diverticulosis. Chronic mild diffuse bladder wall thickening, unchanged and probably due to chronic bladder outlet obstruction by the mildly enlarged prostate. 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 Aortic Atherosclerosis (ICD10-I70.0). Electronically Signed   By: JIlona SorrelM.D.   On: 10/23/2021 16:26   CT Abdomen Pelvis W Contrast  Result Date: 10/23/2021 CLINICAL DATA:  Stage IV left upper lobe lung adenocarcinoma diagnosed July 2022 with brain metastasis. History of palliative radiation therapy to the left upper lobe primary nodule. Status post chemotherapy with ongoing immunotherapy. Restaging. * Tracking Code: BO * EXAM: CT CHEST, ABDOMEN, AND PELVIS WITH CONTRAST TECHNIQUE: Multidetector CT imaging of the chest, abdomen and pelvis was performed following the standard protocol during bolus administration of intravenous contrast. RADIATION DOSE REDUCTION: This exam was performed according to the departmental dose-optimization program which includes automated exposure control, adjustment of the mA and/or kV according to patient size and/or use of iterative reconstruction technique. CONTRAST:  1062mOMNIPAQUE IOHEXOL 300 MG/ML  SOLN COMPARISON:  08/24/2021 chest CT angiogram. 07/16/2021 CT chest, abdomen and pelvis. FINDINGS: CT CHEST FINDINGS Cardiovascular: Normal heart size. No significant pericardial effusion/thickening. Three-vessel coronary atherosclerosis. Right internal jugular Port-A-Cath terminates in the lower third of the SVC. Atherosclerotic nonaneurysmal thoracic aorta.  Normal caliber pulmonary arteries. No central pulmonary emboli. Mediastinum/Nodes: No significant thyroid nodules. Unremarkable esophagus. No pathologically enlarged axillary, mediastinal or hilar lymph nodes. Lungs/Pleura: No pneumothorax. No pleural effusion. Sharply marginated band like left perihilar lung consolidation with associated volume loss and bronchiectasis, compatible with evolving postradiation change. Moderate patchy confluent subpleural reticulation and ground-glass opacity in both lungs with associated mild traction bronchiectasis, not appreciably changed. No new significant pulmonary nodules. Musculoskeletal: No aggressive appearing focal osseous lesions. Mild-to-moderate thoracic spondylosis. CT ABDOMEN PELVIS FINDINGS Hepatobiliary: Normal liver with no liver mass. Normal gallbladder with no radiopaque cholelithiasis. No biliary ductal dilatation. Pancreas: Normal, with no mass or duct dilation. Spleen: Normal size. No mass. Adrenals/Urinary Tract: Normal adrenals. Simple 2.4 cm anterior interpolar left renal cyst, with additional scattered subcentimeter hypodense bilateral renal cortical lesions that are too small to characterize, for which no follow-up imaging is recommended. No hydronephrosis. Chronic mild diffuse bladder wall thickening. Stomach/Bowel: Normal non-distended stomach. Normal caliber small bowel with no small bowel wall thickening. Normal appendix. Oral contrast transits to the colon. Moderate to marked diffuse colonic diverticulosis with no large bowel wall thickening or significant pericolonic fat stranding. Vascular/Lymphatic: Atherosclerotic nonaneurysmal abdominal aorta. Patent portal, splenic, hepatic and renal veins. No pathologically enlarged lymph nodes in the abdomen or pelvis. Reproductive: Borderline mild prostatomegaly. Other: No pneumoperitoneum, ascites or focal fluid collection. Musculoskeletal: No aggressive appearing focal osseous lesions. Chronic bilateral L5  pars defects with severe degenerative disc disease and 11 mm anterolisthesis at L5-S1. IMPRESSION: 1. Evolving postradiation change in the perihilar left lung, with no evidence of local tumor recurrence. 2. No evidence of recurrent metastatic disease in the chest, abdomen or pelvis. 3. 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 4. Chronic findings include: Three-vessel coronary atherosclerosis. Moderate to marked diffuse colonic diverticulosis. Chronic mild diffuse bladder wall thickening, unchanged and probably due to chronic bladder outlet obstruction by the mildly enlarged prostate. 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 Aortic Atherosclerosis (ICD10-I70.0). Electronically Signed   By: Ilona Sorrel M.D.   On: 10/23/2021 16:26    ASSESSMENT AND PLAN: This is a very pleasant 81 years old white male recently diagnosed with a stage IV (T1b, N2, M1a) non-small cell lung cancer, adenocarcinoma presented with left upper lobe lung nodule in addition to mediastinal lymphadenopathy and pleural-based metastasis as well as malignant left pleural effusion diagnosed in July 2022. His molecular studies by [facility] 1 showed no actionable mutations. The patient is currently undergoing systemic chemotherapy with carboplatin for AUC of 5, Alimta 500 Mg/M2 and Keytruda 200 Mg IV every 3 weeks.  He is status post 20 cycles.  Starting from cycle #5 the patient will be on treatment with Alimta and Keytruda every 3 weeks. He underwent palliative radiotherapy to the enlarging left upper lobe lung mass by radiation oncology in [place]. The patient has been tolerating his systemic treatment with maintenance Alimta and Keytruda fairly well. I recommended for him to proceed with cycle #21 today as planned. For the brain metastasis he is followed by radiation oncology as well as neurooncology.  He is  supposed to have repeat MRI of the brain later this month. I we will see the  patient back for follow-up visit in 3 weeks for evaluation before starting cycle #22. He was advised to call immediately if he has any other concerning issues in the interval. The patient voices understanding of current disease status and treatment options and is in agreement with the current care plan.  All questions were answered. The patient knows to call the clinic with any problems, questions or concerns. We can certainly see the patient much sooner if necessary.  Disclaimer: This note was dictated with voice recognition software. Similar sounding words can inadvertently be transcribed and may not be corrected upon review.

## 2021-11-16 NOTE — Patient Instructions (Signed)
[facility] ONCOLOG[facility]  Discharge Instructions: Thank you for choosing [facility] to provide your oncology and hematology care.   If you have a lab appointment with the [facility], please go directly to the [facility] and check in at the registration area.   Wear comfortable clothing and clothing appropriate for easy access to any Portacath or PICC line.   We strive to give you quality time with your provider. You may need to reschedule your appointment if you arrive late (15 or more minutes).  Arriving late affects you and other patients whose appointments are after yours.  Also, if you miss three or more appointments without notifying the office, you may be dismissed from the clinic at the provider's discretion.      For prescription refill requests, have your pharmacy contact our office and allow 72 hours for refills to be completed.    Today you received the following chemotherapy and/or immunotherapy agents: Keytruda/Alimta     To help prevent nausea and vomiting after your treatment, we encourage you to take your nausea medication as directed.  BELOW ARE SYMPTOMS THAT SHOULD BE REPORTED IMMEDIATELY: *FEVER GREATER THAN 100.4 F (38 C) OR HIGHER *CHILLS OR SWEATING *NAUSEA AND VOMITING THAT IS NOT CONTROLLED WITH YOUR NAUSEA MEDICATION *UNUSUAL SHORTNESS OF BREATH *UNUSUAL BRUISING OR BLEEDING *URINARY PROBLEMS (pain or burning when urinating, or frequent urination) *BOWEL PROBLEMS (unusual diarrhea, constipation, pain near the anus) TENDERNESS IN MOUTH AND THROAT WITH OR WITHOUT PRESENCE OF ULCERS (sore throat, sores in mouth, or a toothache) UNUSUAL RASH, SWELLING OR PAIN  UNUSUAL VAGINAL DISCHARGE OR ITCHING   Items with * indicate a potential emergency and should be followed up as soon as possible or go to the Emergency Department if any problems should occur.  Please show the CHEMOTHERAPY ALERT CARD or IMMUNOTHERAPY ALERT CARD at  check-in to the Emergency Department and triage nurse.  Should you have questions after your visit or need to cancel or reschedule your appointment, please contact [facility]  Dept: [phone]  and follow the prompts.  Office hours are 8:00 a.m. to 4:30 p.m. Monday - Friday. Please note that voicemails left after 4:00 p.m. may not be returned until the following business day.  We are closed weekends and major holidays. You have access to a nurse at all times for urgent questions. Please call the main number to the clinic Dept: [phone] and follow the prompts.   For any non-urgent questions, you may also contact your provider using MyChart. We now offer e-Visits for anyone 3 and older to request care online for non-urgent symptoms. For details visit [email or web address].   Also download the MyChart app! Go to the app store, search "MyChart", open the app, select [facility], and log in with your MyChart username and password.  Masks are optional in the cancer centers. If you would like for your care team to wear a mask while they are taking care of you, please let them know. You may have one support person who is at least 80 years old accompany you for your appointments.

## 2021-11-17 LAB — T4: T4, Total: 7.7 ug/dL (ref 4.5–12.0)

## 2021-11-21 ENCOUNTER — Other Ambulatory Visit: Payer: Self-pay

## 2021-11-24 ENCOUNTER — Other Ambulatory Visit: Payer: Self-pay

## 2021-12-03 ENCOUNTER — Other Ambulatory Visit: Payer: Self-pay

## 2021-12-03 ENCOUNTER — Ambulatory Visit
Admission: RE | Admit: 2021-12-03 | Discharge: 2021-12-03 | Disposition: A | Discharge status: Home / Self Care | Payer: Medicare HMO | Source: Ambulatory Visit | Attending: Internal Medicine | Admitting: Internal Medicine

## 2021-12-03 DIAGNOSIS — C7931 Secondary malignant neoplasm of brain: Secondary | ICD-10-CM

## 2021-12-03 MED ORDER — HEPARIN SOD (PORK) LOCK FLUSH 100 UNIT/ML IV SOLN
500.0000 [IU] | Freq: Once | INTRAVENOUS | Status: AC
  Administered 2021-12-03: 500 [IU] via INTRAVENOUS

## 2021-12-03 MED ORDER — GADOPICLENOL 0.5 MMOL/ML IV SOLN
9.0000 mL | Freq: Once | INTRAVENOUS | Status: AC | PRN
  Administered 2021-12-03: 9 mL via INTRAVENOUS

## 2021-12-03 MED ORDER — SODIUM CHLORIDE 0.9% FLUSH
10.0000 mL | INTRAVENOUS | Status: DC | PRN
  Administered 2021-12-03: 10 mL via INTRAVENOUS

## 2021-12-06 NOTE — Progress Notes (Unsigned)
[facility] OFFICE PROGRESS NOTE  [name], MD [address] [place] [address]  DIAGNOSIS: Stage IV (T1b, N2, M1a) non-small cell lung cancer, adenocarcinoma diagnosed in July 2022 and presented with left upper lobe nodule in addition to AP window lymphadenopathy and left-sided malignant pleural effusion as well as pleural metastatic disease.  The patient also has solitary left temporal brain metastasis.   Biomarker Findings Microsatellite status - MS-Stable Tumor Mutational Burden - 4 Muts/Mb Genomic Findings For a complete list of the genes assayed, please refer to the Appendix. EKC0KL K917H PTEN splice site 150-5W>P - subclonal? DOT1L S911L - subclonal? RAD21 S271* RB1 loss exons 3-23 TP53 N327f*34 8 Disease relevant genes with no reportable alterations: ALK, BRAF, EGFR, ERBB2, KRAS, MET, RET, ROS1  PRIOR THERAPY: 1)  SRS to the brain completed on 09/03/2020 2) [facility]ing [facility]. Completed in June 2023  CURRENT THERAPY: 1) Systemic chemotherapy with carboplatin for AUC of 5, Alimta 500 Mg/M2 and Keytruda 200 Mg IV every 3 weeks.  First dose August 13, 2020.  Status post 20 cycles.  Starting from cycle #5 he is on maintenance treatment with Alimta and Keytruda every 3 weeks.   INTERVAL HISTORY: Peter Asare89y.o. male returns to the clinic today for a follow-up visit accompanied by his wife.  The patient was last seen in clinic 3 weeks ago by Dr. MJulien Nordmann  In the interim since last being seen, the patient had a repeat brain MRI and is expected to see Dr. VMickeal Skinnertoday to review the results. The patient is currently on a decadron taper. He was endorsing some facial swelling and swelling in the abdomen due to the steroids. He also gained about 10 lbs since last month.   Otherwise, the patient is currently undergoing immunotherapy with Keytruda and Alimta.  He is  tolerating this fairly well.  He denies any fever, chills, or night sweats. He has stable/slightly worsening dyspnea on exertion. He gets winded in the shower. 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 He denies any chest pain or hemoptysis.  He denies significant cough. [place] is [place] supplemental oxygen at night. He denies any nausea, vomiting, diarrhea, or constipation. Denies any visual changes. He sometimes has a headache but has not tried taking any medication for it. Head hurts. Denies any rashes or skin changes. He is here today for evaluation before starting cycle #21.     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 ICD10 Conversion    ALLERGIES:  has No Known Allergies.  MEDICATIONS:  Current Outpatient Medications  Medication Sig Dispense Refill   acetaminophen (TYLENOL) 500 MG tablet Take 500 mg by mouth every 6 (six) hours as needed for headache.     albuterol (VENTOLIN HFA) 108 (90 Base) MCG/ACT inhaler Inhale 2 puffs into the lungs every 4 (four) hours as needed for wheezing or shortness of breath.     aspirin 81 MG EC tablet Take  81 mg by mouth daily.     Bacillus Coagulans-Inulin (PROBIOTIC) 1-250 BILLION-MG CAPS Take 1 tablet by mouth daily.     bisacodyl (DULCOLAX) 5 MG EC tablet Take 5 mg by mouth 3 (three) times daily as needed for moderate constipation.     Cholecalciferol (D3 PO) Take 1 tablet by mouth daily.     coal tar-salicylic acid 2 % shampoo Apply topically daily as needed for itching.      dexamethasone (DECADRON) 1 MG tablet Take 1 tablet (1 mg total) by mouth daily. 90 tablet 0   DULoxetine (CYMBALTA) 30 MG capsule Take 30 mg by mouth daily.     fexofenadine (ALLEGRA) 180 MG tablet Take 180 mg by mouth daily as needed for allergies.     fluocinonide cream (LIDEX) 1.02 % Apply 1 application  topically 2 (two) times daily as needed (psroasis).     folic acid (FOLVITE) 1 MG tablet TAKE 1 TABLET(1 MG) BY MOUTH DAILY 30 tablet 4   hydrocortisone cream 1 % Apply 1 application topically 2 (two) times daily. (Patient taking differently: Apply 1 application  topically 2 (two) times daily as needed (rash).) 453.6 g 0   ketoconazole (NIZORAL) 2 % cream Apply 1 application  topically daily as needed (rash).     Krill Oil (OMEGA-3) 500 MG CAPS Take 1 capsule by mouth daily.     levothyroxine (SYNTHROID) 25 MCG tablet TAKE 1 TABLET(25 MCG) BY MOUTH DAILY BEFORE BREAKFAST 30 tablet 2   lidocaine-prilocaine (EMLA) cream Apply 1 application topically as needed. (Patient taking differently: Apply 1 application  topically daily as needed (access port).) 30 g 1   Magnesium 500 MG TABS Take 1 tablet by mouth every evening.     Niacin (VITAMIN B-3 PO) Take 1 tablet by mouth daily.     omeprazole (PRILOSEC) 40 MG capsule Take 40 mg by mouth every evening.     tamsulosin (FLOMAX) 0.4 MG CAPS capsule Take 0.4 mg by mouth at bedtime.     No current facility-administered medications for this visit.    SURGICAL HISTORY:  Past Surgical History:  Procedure Laterality Date   IR IMAGING GUIDED PORT INSERTION  11/24/2020   KNEE SURGERY     orthoscopic     TONSILLECTOMY AND ADENOIDECTOMY      REVIEW OF SYSTEMS:   Review of Systems  Constitutional: Positive for stable fatigue. Negative for appetite change, chills, fever and unexpected weight change.  HENT: Negative for mouth sores, nosebleeds, sore throat and trouble swallowing.   Eyes: Negative for eye problems and icterus.  Respiratory: Positive for  stable/slightly dyspnea on exertion. Negative for cough, hemoptysis, and wheezing.   Cardiovascular: Negative for chest pain and leg swelling.  Gastrointestinal: Negative for abdominal pain, constipation, diarrhea, nausea and vomiting.  Genitourinary: Negative for bladder incontinence, difficulty urinating, dysuria, frequency and hematuria.   Musculoskeletal: Negative for back pain, gait problem, neck pain and neck stiffness.  Skin: Negative for itching and rash.  Neurological: Positive for occasional headaches. Negative for dizziness, extremity weakness, gait problem, light-headedness and seizures.  Hematological: Negative for adenopathy. Does not bruise/bleed easily.  Psychiatric/Behavioral: Negative for confusion, depression and sleep disturbance. The patient is not nervous/anxious.   PHYSICAL EXAMINATION:  Blood pressure 119/72, pulse 71, temperature (!) 97.5 F (36.4 C), temperature source Oral, resp. rate 14, weight 196 lb (88.9 kg), SpO2 98 %.  ECOG PERFORMANCE STATUS: 1  Physical Exam  Constitutional: Oriented to person, place, and time and well-developed, well-nourished, and in  no distress.  HENT:  Head: Normocephalic and atraumatic.  Mouth/Throat: Oropharynx is clear and moist. No oropharyngeal exudate.  Eyes: Conjunctivae are normal. Right eye exhibits no discharge. Left eye exhibits no discharge. No scleral icterus.  Neck: Normal range of motion. Neck supple.  Cardiovascular: Normal rate, regular rhythm, normal heart sounds and intact distal pulses.   Pulmonary/Chest: Effort normal and breath sounds normal. No respiratory distress. No wheezes. No rales.  Abdominal: Soft. Bowel sounds are normal. Exhibits no distension and no mass. There is no tenderness.  Musculoskeletal: Normal range of motion. Exhibits no edema.  Lymphadenopathy:    No cervical adenopathy.  Neurological: Alert and oriented to person, place, and time. Exhibits normal muscle tone. Gait normal. Coordination  normal.  Skin: Skin is warm and dry. No rash noted. Not diaphoretic. No erythema. No pallor.  Psychiatric: Mood, memory and judgment normal.  Vitals reviewed.  LABORATORY DATA: Lab Results  Component Value Date   WBC 6.2 12/07/2021   HGB 9.9 (L) 12/07/2021   HCT 28.4 (L) 12/07/2021   MCV 96.6 12/07/2021   PLT 204 12/07/2021      Chemistry      Component Value Date/Time   NA 128 (L) 12/07/2021 1046   K 4.3 12/07/2021 1046   CL 95 (L) 12/07/2021 1046   CO2 27 12/07/2021 1046   BUN 14 12/07/2021 1046   CREATININE 0.95 12/07/2021 1046      Component Value Date/Time   CALCIUM 9.3 12/07/2021 1046   ALKPHOS 60 12/07/2021 1046   AST 19 12/07/2021 1046   ALT 12 12/07/2021 1046   BILITOT 0.4 12/07/2021 1046       RADIOGRAPHIC STUDIES:  MR BRAIN W WO CONTRAST  Result Date: 12/04/2021 CLINICAL DATA:  History of lung and brain cancer. Assess treatment response. EXAM: MRI HEAD WITHOUT AND WITH CONTRAST TECHNIQUE: Multiplanar, multiecho pulse sequences of the brain and surrounding structures were obtained without and with intravenous contrast. CONTRAST:  10 cc [employer] COMPARISON:  Brain MRI 10/02/2021 FINDINGS: Brain: The peripherally enhancing lesion in the left anterior temporal lobe today measures 2.8 cm AP x 2.2 cm TV by 2.7 cm cc, not significantly changed in size allowing for slight differences in measurement technique and slice selection. FLAIR signal abnormality surrounding the lesion throughout much of the temporal lobe extending to the internal and external capsules is again seen though overall decreased. Particularly, previously seen FLAIR signal abnormality in the peritrigonal white matter has resolved. There is no new or worsening mass effect. A small amount of intralesional SWI signal dropout is unchanged. The previously seen punctate focus of enhancement in the right cerebellum is not seen on the current study. There are no new lesions. There is no acute intracranial hemorrhage,  extra-axial fluid collection, or acute infarct. Parenchymal volume is stable. The ventricles are stable in size. There is no midline shift. Vascular: Normal flow voids. Skull and upper cervical spine: Normal marrow signal. Sinuses/Orbits: The paranasal sinuses are clear. The globes and orbits are unremarkable. Other: None. IMPRESSION: 1. Stable size of the peripherally enhancing lesion in the left temporal lobe with decreased surrounding FLAIR signal abnormality. 2. Previously seen punctate lesion in the right cerebellum is not seen on the current study. No new lesions. Electronically Signed   By: Valetta Mole M.D.   On: 12/04/2021 14:20     ASSESSMENT/PLAN:  This is a very pleasant 80 year old Caucasian male diagnosed with stage IV (T1b, N2, M1A) non-small cell lung cancer, adenocarcinoma.  The patient presented  with a left upper lobe lung nodule in addition to mediastinal lymphadenopathy and pleural-based metastasis as well as a malignant left pleural effusion.  The patient was diagnosed in July 2022.  The patient also had a metastatic brain lesion.   The patient completed SRS to the brain lesion on 09/03/2020 under the care of Dr. [name].   The patient is currently undergoing systemic chemotherapy with carboplatin for an AUC of 5, Alimta 500 mg per metered squared, Keytruda 200 mg IV every 3 weeks.  The patient is status post 20 cycles.  Starting from cycle #5, the patient started maintenance Alimta and Keytruda  On the patient's restaging CT scan from 04/23/2021, the patient had an enlarging left upper lobe nodule.  The patient completed SBRT to this lesion in [place].  This was completed in June 2023   The patient is also being followed by radiation oncology neurooncology regarding a solitary brain metastasis.  He is followed closely by Dr. Mickeal Skinner who saw him earlier this month. The patient is also followed by pulmonology in [facility].    Labs were reviewed. His sodium is a little  low today which occurred previously. We discussed this today. We will monitor closely. He has stable anemia. Recommend he proceed with cycle #21 today as scheduled.   I will arrange for a restaging CT scan of the chest, abdomen, and pelvis prior to starting his next cycle of treatment.   We will see him back for follow-up visit in 3 weeks for evaluation and repeat blood work before undergoing cycle #21.   We will continue to monitor the patient's thyroid function and anemia.  His thyroid labs are pending at this time.  He will follow up with Dr. Mickeal Skinner today as scheduled to review his brain MRI. Discussed he can use tylenol of headaches if needed.   The patient was advised to call immediately if he has any concerning symptoms in the interval. The patient voices understanding of current disease status and treatment options and is in agreement with the current care plan. All questions were answered. The patient knows to call the clinic with any problems, questions or concerns. We can certainly see the patient much sooner if necessary    Orders Placed This Encounter  Procedures   CT Chest W Contrast    Standing Status:   Future    Standing Expiration Date:   12/07/2022    Order Specific Question:   If indicated for the ordered procedure, I authorize the administration of contrast media per Radiology protocol    Answer:   Yes    Order Specific Question:   Does the patient have a contrast media/X-ray dye allergy?    Answer:   No    Order Specific Question:   Preferred imaging location?    Answer:   [facility]   CT Abdomen Pelvis W Contrast    Standing Status:   Future    Standing Expiration Date:   12/07/2022    Order Specific Question:   If indicated for the ordered procedure, I authorize the administration of contrast media per Radiology protocol    Answer:   Yes    Order Specific Question:   Does the patient have a contrast media/X-ray dye allergy?    Answer:   No    Order  Specific Question:   Preferred imaging location?    Answer:   [facility]    Order Specific Question:   Is Oral Contrast requested  for this exam?    Answer:   Yes, Per Radiology protocol     The total time spent in the appointment was 20-29 minutes.   Peter Thorley L Nerida Boivin, PA-C 12/07/21

## 2021-12-07 ENCOUNTER — Ambulatory Visit: Payer: Medicare HMO

## 2021-12-07 ENCOUNTER — Inpatient Hospital Stay: Payer: Medicare HMO | Attending: Internal Medicine | Admitting: Internal Medicine

## 2021-12-07 ENCOUNTER — Telehealth: Payer: Self-pay | Admitting: Internal Medicine

## 2021-12-07 ENCOUNTER — Inpatient Hospital Stay: Payer: Medicare HMO

## 2021-12-07 ENCOUNTER — Ambulatory Visit: Payer: Medicare HMO | Admitting: Physician Assistant

## 2021-12-07 ENCOUNTER — Inpatient Hospital Stay (HOSPITAL_BASED_OUTPATIENT_CLINIC_OR_DEPARTMENT_OTHER): Payer: Medicare HMO | Admitting: Physician Assistant

## 2021-12-07 ENCOUNTER — Other Ambulatory Visit: Payer: Medicare HMO

## 2021-12-07 ENCOUNTER — Other Ambulatory Visit: Payer: Self-pay

## 2021-12-07 VITALS — BP 119/72 | HR 71 | Temp 97.5°F | Resp 14 | Wt 196.0 lb

## 2021-12-07 DIAGNOSIS — C7931 Secondary malignant neoplasm of brain: Secondary | ICD-10-CM | POA: Diagnosis not present

## 2021-12-07 DIAGNOSIS — J910 Malignant pleural effusion: Secondary | ICD-10-CM | POA: Diagnosis not present

## 2021-12-07 DIAGNOSIS — Z95828 Presence of other vascular implants and grafts: Secondary | ICD-10-CM

## 2021-12-07 DIAGNOSIS — Z79899 Other long term (current) drug therapy: Secondary | ICD-10-CM | POA: Insufficient documentation

## 2021-12-07 DIAGNOSIS — Z5112 Encounter for antineoplastic immunotherapy: Secondary | ICD-10-CM | POA: Insufficient documentation

## 2021-12-07 DIAGNOSIS — C3492 Malignant neoplasm of unspecified part of left bronchus or lung: Secondary | ICD-10-CM

## 2021-12-07 DIAGNOSIS — Z5111 Encounter for antineoplastic chemotherapy: Secondary | ICD-10-CM

## 2021-12-07 DIAGNOSIS — D649 Anemia, unspecified: Secondary | ICD-10-CM | POA: Diagnosis not present

## 2021-12-07 DIAGNOSIS — C3412 Malignant neoplasm of upper lobe, left bronchus or lung: Secondary | ICD-10-CM | POA: Diagnosis not present

## 2021-12-07 LAB — CBC WITH DIFFERENTIAL (CANCER CENTER ONLY)
Abs Immature Granulocytes: 0.04 10*3/uL (ref 0.00–0.07)
Basophils Absolute: 0 10*3/uL (ref 0.0–0.1)
Basophils Relative: 1 %
Eosinophils Absolute: 0.2 10*3/uL (ref 0.0–0.5)
Eosinophils Relative: 2 %
HCT: 28.4 % — ABNORMAL LOW (ref 39.0–52.0)
Hemoglobin: 9.9 g/dL — ABNORMAL LOW (ref 13.0–17.0)
Immature Granulocytes: 1 %
Lymphocytes Relative: 19 %
Lymphs Abs: 1.2 10*3/uL (ref 0.7–4.0)
MCH: 33.7 pg (ref 26.0–34.0)
MCHC: 34.9 g/dL (ref 30.0–36.0)
MCV: 96.6 fL (ref 80.0–100.0)
Monocytes Absolute: 1.1 10*3/uL — ABNORMAL HIGH (ref 0.1–1.0)
Monocytes Relative: 17 %
Neutro Abs: 3.7 10*3/uL (ref 1.7–7.7)
Neutrophils Relative %: 60 %
Platelet Count: 204 10*3/uL (ref 150–400)
RBC: 2.94 MIL/uL — ABNORMAL LOW (ref 4.22–5.81)
RDW: 15.8 % — ABNORMAL HIGH (ref 11.5–15.5)
WBC Count: 6.2 10*3/uL (ref 4.0–10.5)
nRBC: 0 % (ref 0.0–0.2)

## 2021-12-07 LAB — CMP (CANCER CENTER ONLY)
ALT: 12 U/L (ref 0–44)
AST: 19 U/L (ref 15–41)
Albumin: 3.9 g/dL (ref 3.5–5.0)
Alkaline Phosphatase: 60 U/L (ref 38–126)
Anion gap: 6 (ref 5–15)
BUN: 14 mg/dL (ref 8–23)
CO2: 27 mmol/L (ref 22–32)
Calcium: 9.3 mg/dL (ref 8.9–10.3)
Chloride: 95 mmol/L — ABNORMAL LOW (ref 98–111)
Creatinine: 0.95 mg/dL (ref 0.61–1.24)
GFR, Estimated: >60 mL/min (ref >60)
Glucose, Bld: 111 mg/dL — ABNORMAL HIGH (ref 70–99)
Potassium: 4.3 mmol/L (ref 3.5–5.1)
Sodium: 128 mmol/L — ABNORMAL LOW (ref 135–145)
Total Bilirubin: 0.4 mg/dL (ref 0.3–1.2)
Total Protein: 6.3 g/dL — ABNORMAL LOW (ref 6.5–8.1)

## 2021-12-07 MED ORDER — SODIUM CHLORIDE 0.9% FLUSH
10.0000 mL | Freq: Once | INTRAVENOUS | Status: AC
  Administered 2021-12-07: 10 mL

## 2021-12-07 MED ORDER — SODIUM CHLORIDE 0.9 % IV SOLN
200.0000 mg | Freq: Once | INTRAVENOUS | Status: AC
  Administered 2021-12-07: 200 mg via INTRAVENOUS
  Filled 2021-12-07: qty 200

## 2021-12-07 MED ORDER — PROCHLORPERAZINE MALEATE 10 MG PO TABS
10.0000 mg | ORAL_TABLET | Freq: Once | ORAL | Status: AC
  Administered 2021-12-07: 10 mg via ORAL
  Filled 2021-12-07: qty 1

## 2021-12-07 MED ORDER — SODIUM CHLORIDE 0.9 % IV SOLN: Freq: Once | INTRAVENOUS | Status: AC

## 2021-12-07 MED ORDER — HEPARIN SOD (PORK) LOCK FLUSH 100 UNIT/ML IV SOLN
500.0000 [IU] | Freq: Once | INTRAVENOUS | Status: AC | PRN
  Administered 2021-12-07: 500 [IU]

## 2021-12-07 MED ORDER — SODIUM CHLORIDE 0.9 % IV SOLN
500.0000 mg/m2 | Freq: Once | INTRAVENOUS | Status: AC
  Administered 2021-12-07: 1000 mg via INTRAVENOUS
  Filled 2021-12-07: qty 40

## 2021-12-07 MED ORDER — SODIUM CHLORIDE 0.9% FLUSH
10.0000 mL | INTRAVENOUS | Status: DC | PRN
  Administered 2021-12-07: 10 mL

## 2021-12-07 NOTE — Progress Notes (Signed)
[place] at [place], [place] [address] [phone]   Interval Evaluation  Date of Service: 12/07/21 Patient Name: Peter Brown Patient MRN: [id number] Patient DOB: [birth year] Provider: Ventura Sellers, MD  Identifying Statement:  Peter Brown is a 81 y.o. 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[facility])   Primary Cancer:  Oncologic History: Oncology History  Adenocarcinoma, lung, left ([place])  08/04/2020 Initial Diagnosis   Adenocarcinoma of left lung, stage 4 ([place])   08/04/2020 Cancer Staging   Staging form: Lung, AJCC 8th Edition - Clinical: Stage IVA (cT1b, cN2, cM1a) - Signed by Peter Bears, MD on 08/04/2020   08/13/2020 -  Chemotherapy   Patient is on Treatment Plan : LUNG Carboplatin (5) + Pemetrexed (500) + Pembrolizumab (200) D1 q21d Induction x 4 cycles / Maintenance Pemetrexed (500) + Pembrolizumab (200) D1 q21d      CNS Oncologic History 08/25/20: SRS to L temporal metastasis [facility]) 08/14/21: SRSx2 salvage [place] [facility])  Interval History: Peter Brown presents for follow up today after recent MRI brain.  He continues with prior improvement in expression, language output, decadron now down to 1mg  daily.  No issues with the scan.  He remains mostly independent with function, now undergoing chemotherapy infusion with Dr. Julien Brown.    H+P (05/12/21) Patient presented to neurologic attention last week with new onset confusion.  His wife describes him forgetting names, choosing the wrong word, answering simple questions incorrectly.  Onset was over several days.  No seizure activity was witnessed.  No issues with his gait, no right sided weakness.  He denies headaches.  Decadron was started on 5/1 by rad onc, 4mg  twice per day.  He describes significant improvement, very close to his prior baseline, over the past 8 days.  He mowed his lawn this weekend, is otherwise independent with activities of living.  On Pem/Pem with Dr.  Julien Brown, doing well.  Medications: Current Outpatient Medications on File Prior to Visit  Medication Sig Dispense Refill   acetaminophen (TYLENOL) 500 MG tablet Take 500 mg by mouth every 6 (six) hours as needed for headache.     albuterol (VENTOLIN HFA) 108 (90 Base) MCG/ACT inhaler Inhale 2 puffs into the lungs every 4 (four) hours as needed for wheezing or shortness of breath.     aspirin 81 MG EC tablet Take 81 mg by mouth daily.     Bacillus Coagulans-Inulin (PROBIOTIC) 1-250 BILLION-MG CAPS Take 1 tablet by mouth daily.     bisacodyl (DULCOLAX) 5 MG EC tablet Take 5 mg by mouth 3 (three) times daily as needed for moderate constipation.     Cholecalciferol (D3 PO) Take 1 tablet by mouth daily.     coal tar-salicylic acid 2 % shampoo Apply topically daily as needed for itching.     dexamethasone (DECADRON) 1 MG tablet Take 1 tablet (1 mg total) by mouth daily. 90 tablet 0   DULoxetine (CYMBALTA) 30 MG capsule Take 30 mg by mouth daily.     fexofenadine (ALLEGRA) 180 MG tablet Take 180 mg by mouth daily as needed for allergies.     fluocinonide cream (LIDEX) 9.32 % Apply 1 application  topically 2 (two) times daily as needed (psroasis).     folic acid (FOLVITE) 1 MG tablet TAKE 1 TABLET(1 MG) BY MOUTH DAILY 30 tablet 4   hydrocortisone cream 1 % Apply 1 application topically 2 (two) times daily. (Patient taking differently: Apply 1 application  topically 2 (two) times  daily as needed (rash).) 453.6 g 0   ketoconazole (NIZORAL) 2 % cream Apply 1 application  topically daily as needed (rash).     Krill Oil (OMEGA-3) 500 MG CAPS Take 1 capsule by mouth daily.     levothyroxine (SYNTHROID) 25 MCG tablet TAKE 1 TABLET(25 MCG) BY MOUTH DAILY BEFORE BREAKFAST 30 tablet 2   lidocaine-prilocaine (EMLA) cream Apply 1 application topically as needed. (Patient taking differently: Apply 1 application  topically daily as needed (access port).) 30 g 1   Magnesium 500 MG TABS Take 1 tablet by mouth every  evening.     Niacin (VITAMIN B-3 PO) Take 1 tablet by mouth daily.     omeprazole (PRILOSEC) 40 MG capsule Take 40 mg by mouth every evening.     tamsulosin (FLOMAX) 0.4 MG CAPS capsule Take 0.4 mg by mouth at bedtime.     No current facility-administered medications on file prior to visit.    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 ICD10 Conversion   Past Surgical History:  Past Surgical History:  Procedure Laterality Date   IR IMAGING GUIDED PORT INSERTION  11/24/2020   KNEE SURGERY     orthoscopic     TONSILLECTOMY AND ADENOIDECTOMY     Social History:  Social History   Socioeconomic History   Marital status: Married    Spouse name: Not on file   Number of children: Not on file   Years of education: Not on file   Highest education level: Not on file  Occupational History   Not on file  Tobacco Use   Smoking status: Never   Smokeless tobacco: Never  Substance and Sexual Activity   Alcohol use: Never   Drug use: Never   Sexual activity: Not on file  Other Topics Concern   Not on file  Social History Narrative   Not on file   Social Determinants of Health   Financial Resource Strain: Low Risk  (08/22/2020)   Overall Financial Resource Strain (CARDIA)    Difficulty of Paying Living Expenses: Not very hard  Food Insecurity: No Food Insecurity (08/22/2020)   Hunger Vital Sign    Worried About Running Out of Food in the Last Year: Never true    Ran Out of Food in the Last Year: Never true  Transportation Needs: No Transportation Needs (08/22/2020)   PRAPARE -  Hydrologist (Medical): No    Lack of Transportation (Non-Medical): No  Physical Activity: Not on file  Stress: Stress Concern Present (08/22/2020)   [place]    Feeling of Stress : To some extent  Social Connections: Socially Integrated (08/22/2020)   Social Connection and Isolation Panel [NHANES]    Frequency of Communication with Friends and Family: More than three times a week    Frequency of Social Gatherings with Friends and Family: Once a week    Attends Religious Services: More than 4 times per year    Active Member of [employer] or Organizations: Yes    Attends Archivist Meetings: More  than 4 times per year    Marital Status: Married  Human resources officer Violence: Not on file   Family History: No family history on file.  Review of Systems: Constitutional: Doesn't report fevers, chills or abnormal weight loss Eyes: Doesn't report blurriness of vision Ears, nose, mouth, throat, and face: Doesn't report sore throat Respiratory: Doesn't report cough, dyspnea or wheezes Cardiovascular: Doesn't report palpitation, chest discomfort  Gastrointestinal:  Doesn't report nausea, constipation, diarrhea GU: Doesn't report incontinence Skin: Doesn't report skin rashes Neurological: Per HPI Musculoskeletal: Doesn't report joint pain Behavioral/Psych: Doesn't report anxiety  Physical Exam: Wt Readings from Last 3 Encounters:  12/07/21 196 lb (88.9 kg)  11/16/21 190 lb 4 oz (86.3 kg)  10/26/21 186 lb 9.6 oz (84.6 kg)   Temp Readings from Last 3 Encounters:  12/07/21 (!) 97.5 F (36.4 C) (Oral)  11/16/21 98.5 F (36.9 C) (Oral)  10/26/21 97.7 F (36.5 C) (Oral)   BP Readings from Last 3 Encounters:  12/07/21 119/72  11/16/21 123/77  11/16/21 122/67   Pulse Readings from Last 3 Encounters:  12/07/21 71  11/16/21 70  11/16/21 76    KPS: 80. General: Alert, cooperative,  pleasant, in no acute distress Head: Normal EENT: No conjunctival injection or scleral icterus.  Lungs: Resp effort normal Cardiac: Regular rate Abdomen: Non-distended abdomen Skin: No rashes cyanosis or petechiae. Extremities: No clubbing or edema  Neurologic Exam: Mental Status: Awake, alert, attentive to examiner. Oriented to self and environment. Language is notable for mildly impaired fluency.  Impaired short term memory, recall. Cranial Nerves: Visual acuity is grossly normal. Visual fields are full. Extra-ocular movements intact. No ptosis. Face is symmetric Motor: Tone and bulk are normal. Power is full in both arms and legs. Reflexes are symmetric, no pathologic reflexes present.  Sensory: Intact to light touch Gait: Normal.   Labs: I have reviewed the data as listed    Component Value Date/Time   NA 128 (L) 12/07/2021 1046   K 4.3 12/07/2021 1046   CL 95 (L) 12/07/2021 1046   CO2 27 12/07/2021 1046   GLUCOSE 111 (H) 12/07/2021 1046   BUN 14 12/07/2021 1046   CREATININE 0.95 12/07/2021 1046   CALCIUM 9.3 12/07/2021 1046   PROT 6.3 (L) 12/07/2021 1046   ALBUMIN 3.9 12/07/2021 1046   AST 19 12/07/2021 1046   ALT 12 12/07/2021 1046   ALKPHOS 60 12/07/2021 1046   BILITOT 0.4 12/07/2021 1046   GFRNONAA >60 12/07/2021 1046   Lab Results  Component Value Date   WBC 6.2 12/07/2021   NEUTROABS 3.7 12/07/2021   HGB 9.9 (L) 12/07/2021   HCT 28.4 (L) 12/07/2021   MCV 96.6 12/07/2021   PLT 204 12/07/2021    Imaging:  MR BRAIN W WO CONTRAST  Result Date: 12/04/2021 CLINICAL DATA:  History of lung and brain cancer. Assess treatment response. EXAM: MRI HEAD WITHOUT AND WITH CONTRAST TECHNIQUE: Multiplanar, multiecho pulse sequences of the brain and surrounding structures were obtained without and with intravenous contrast. CONTRAST:  10 cc Vueway COMPARISON:  Brain MRI 10/02/2021 FINDINGS: Brain: The peripherally enhancing lesion in the left anterior temporal lobe today  measures 2.8 cm AP x 2.2 cm TV by 2.7 cm cc, not significantly changed in size allowing for slight differences in measurement technique and slice selection. FLAIR signal abnormality surrounding the lesion throughout much of the temporal lobe extending to the internal and external capsules is again seen though overall decreased. Particularly, previously seen FLAIR signal abnormality in the peritrigonal  white matter has resolved. There is no new or worsening mass effect. A small amount of intralesional SWI signal dropout is unchanged. The previously seen punctate focus of enhancement in the right cerebellum is not seen on the current study. There are no new lesions. There is no acute intracranial hemorrhage, extra-axial fluid collection, or acute infarct. Parenchymal volume is stable. The ventricles are stable in size. There is no midline shift. Vascular: Normal flow voids. Skull and upper cervical spine: Normal marrow signal. Sinuses/Orbits: The paranasal sinuses are clear. The globes and orbits are unremarkable. Other: None. IMPRESSION: 1. Stable size of the peripherally enhancing lesion in the left temporal lobe with decreased surrounding FLAIR signal abnormality. 2. Previously seen punctate lesion in the right cerebellum is not seen on the current study. No new lesions. Electronically Signed   By: Valetta Mole M.D.   On: 12/04/2021 14:20    [facility] Clinician Interpretation: I have personally reviewed the radiological images as listed.  My interpretation, in the context of the patient's clinical presentation, is treatment effect vs true progression   Assessment/Plan Malignant neoplasm metastatic to brain [facility])  Delvonte Berenson is clinically stable today.  MRI brain demonstrates stability of previously progressive left temporal metastasis.  Edema is clearly improved. Other recently treated sites are improved or stable.   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  We recommended discontinuing decadron.  He will continue to undergo carbo/pem/pem with Dr. Julien Brown in the interim.  We appreciate the opportunity to participate in the care of Elric Tirado.   We ask that Jaimin Krupka return to clinic in 3 months following next brain MRI, or sooner as needed.  All questions were answered. The patient knows to call the clinic with any problems, questions or concerns. No barriers to learning were detected.  The total time spent in the encounter was 30 minutes and more than 50% was on counseling and review of test results   Peter Sellers, MD Medical Director of Neuro-Oncology [facility] [place] 12/07/21 12:32 PM

## 2021-12-07 NOTE — Telephone Encounter (Signed)
Called patient regarding upcoming December, January and February appointments. Patient is notified.

## 2021-12-07 NOTE — Patient Instructions (Signed)
[facility] ONCOL[facility]  Discharge Instructions: Thank you for choosing [facility] to provide your oncology and hematology care.   If you have a lab appointment with the [facility], please go directly to the [facility] and check in at the registration area.   Wear comfortable clothing and clothing appropriate for easy access to any Portacath or PICC line.   We strive to give you quality time with your provider. You may need to reschedule your appointment if you arrive late (15 or more minutes).  Arriving late affects you and other patients whose appointments are after yours.  Also, if you miss three or more appointments without notifying the office, you may be dismissed from the clinic at the provider's discretion.      For prescription refill requests, have your pharmacy contact our office and allow 72 hours for refills to be completed.    Today you received the following chemotherapy and/or immunotherapy agents: Keytruda/Alimta      To help prevent nausea and vomiting after your treatment, we encourage you to take your nausea medication as directed.  BELOW ARE SYMPTOMS THAT SHOULD BE REPORTED IMMEDIATELY: *FEVER GREATER THAN 100.4 F (38 C) OR HIGHER *CHILLS OR SWEATING *NAUSEA AND VOMITING THAT IS NOT CONTROLLED WITH YOUR NAUSEA MEDICATION *UNUSUAL SHORTNESS OF BREATH *UNUSUAL BRUISING OR BLEEDING *URINARY PROBLEMS (pain or burning when urinating, or frequent urination) *BOWEL PROBLEMS (unusual diarrhea, constipation, pain near the anus) TENDERNESS IN MOUTH AND THROAT WITH OR WITHOUT PRESENCE OF ULCERS (sore throat, sores in mouth, or a toothache) UNUSUAL RASH, SWELLING OR PAIN  UNUSUAL VAGINAL DISCHARGE OR ITCHING   Items with * indicate a potential emergency and should be followed up as soon as possible or go to the Emergency Department if any problems should occur.  Please show the CHEMOTHERAPY ALERT CARD or IMMUNOTHERAPY ALERT CARD at  check-in to the Emergency Department and triage nurse.  Should you have questions after your visit or need to cancel or reschedule your appointment, please contact [facility]  Dept: [phone]  and follow the prompts.  Office hours are 8:00 a.m. to 4:30 p.m. Monday - Friday. Please note that voicemails left after 4:00 p.m. may not be returned until the following business day.  We are closed weekends and major holidays. You have access to a nurse at all times for urgent questions. Please call the main number to the clinic Dept: [phone] and follow the prompts.   For any non-urgent questions, you may also contact your provider using MyChart. We now offer e-Visits for anyone 42 and older to request care online for non-urgent symptoms. For details visit [email or web address].   Also download the MyChart app! Go to the app store, search "MyChart", open the app, select [facility], and log in with your MyChart username and password.  Masks are optional in the cancer centers. If you would like for your care team to wear a mask while they are taking care of you, please let them know. You may have one support person who is at least 80 years old accompany you for your appointments.

## 2021-12-08 ENCOUNTER — Telehealth: Payer: Self-pay | Admitting: Internal Medicine

## 2021-12-08 ENCOUNTER — Encounter: Payer: Self-pay | Admitting: Internal Medicine

## 2021-12-08 NOTE — Telephone Encounter (Signed)
Per 12/5 secure chat called and spoke to pt wife

## 2021-12-09 ENCOUNTER — Other Ambulatory Visit: Payer: Self-pay

## 2021-12-14 ENCOUNTER — Ambulatory Visit: Payer: Medicare HMO | Admitting: Internal Medicine

## 2021-12-15 ENCOUNTER — Encounter: Payer: Self-pay | Admitting: Internal Medicine

## 2021-12-16 ENCOUNTER — Telehealth: Payer: Self-pay | Admitting: Medical Oncology

## 2021-12-16 NOTE — Telephone Encounter (Signed)
-----   Message from Peter Bears, MD sent at 12/15/2021  2:14 PM EST ----- Regarding: RE: cataract surgery ok to proceed. Thank you ----- Message ----- From: Ardeen Garland, RN Sent: 12/15/2021  11:53 AM EST To: Peter Bears, MD Subject: cataract surgery                               The eye Dr has suggested Peter Brown have a cataract removed from his eyes. What is Dr Peter Brown thoughts on this .  Peter Brown. [phone]

## 2021-12-16 NOTE — Telephone Encounter (Signed)
Wife notified.

## 2021-12-19 ENCOUNTER — Encounter: Payer: Self-pay | Admitting: Internal Medicine

## 2021-12-21 ENCOUNTER — Encounter: Payer: Self-pay | Admitting: Internal Medicine

## 2021-12-23 ENCOUNTER — Other Ambulatory Visit: Payer: Self-pay | Admitting: Radiation Therapy

## 2021-12-25 ENCOUNTER — Encounter (HOSPITAL_COMMUNITY): Payer: Self-pay

## 2021-12-25 ENCOUNTER — Other Ambulatory Visit: Payer: Self-pay

## 2021-12-25 ENCOUNTER — Ambulatory Visit (HOSPITAL_COMMUNITY)
Admission: RE | Admit: 2021-12-25 | Discharge: 2021-12-25 | Disposition: A | Discharge status: Home / Self Care | Payer: Medicare HMO | Source: Ambulatory Visit | Attending: Physician Assistant | Admitting: Physician Assistant

## 2021-12-25 DIAGNOSIS — C3492 Malignant neoplasm of unspecified part of left bronchus or lung: Secondary | ICD-10-CM | POA: Diagnosis present

## 2021-12-25 MED ORDER — HEPARIN SOD (PORK) LOCK FLUSH 100 UNIT/ML IV SOLN
INTRAVENOUS | Status: AC
  Administered 2021-12-25: 500 [IU] via INTRAVENOUS
  Filled 2021-12-25: qty 5

## 2021-12-25 MED ORDER — IOHEXOL 300 MG/ML  SOLN
100.0000 mL | Freq: Once | INTRAMUSCULAR | Status: AC | PRN
  Administered 2021-12-25: 100 mL via INTRAVENOUS

## 2021-12-25 MED ORDER — HEPARIN SOD (PORK) LOCK FLUSH 100 UNIT/ML IV SOLN: 500.0000 [IU] | Freq: Once | INTRAVENOUS | Status: AC

## 2021-12-29 ENCOUNTER — Ambulatory Visit: Payer: Medicare HMO

## 2021-12-29 ENCOUNTER — Other Ambulatory Visit: Payer: Medicare HMO

## 2021-12-29 ENCOUNTER — Ambulatory Visit: Payer: Medicare HMO | Admitting: Internal Medicine

## 2021-12-29 NOTE — Progress Notes (Signed)
[facility] OFFICE PROGRESS NOTE  [name], MD [address] [place] [address]  DIAGNOSIS: Stage IV (T1b, N2, M1a) non-small cell lung cancer, adenocarcinoma diagnosed in July 2022 and presented with left upper lobe nodule in addition to AP window lymphadenopathy and left-sided malignant pleural effusion as well as pleural metastatic disease.  The patient also has solitary left temporal brain metastasis.   Biomarker Findings Microsatellite status - MS-Stable Tumor Mutational Burden - 4 Muts/Mb Genomic Findings For a complete list of the genes assayed, please refer to the Appendix. CHY8FO [id number] PTEN splice site 287-8M>V - subclonal? DOT1L S911L - subclonal? RAD21 S271* RB1 loss exons 3-23 TP53 N34f*34 8 Disease relevant genes with no reportable alterations: ALK, BRAF, EGFR, ERBB2, KRAS, MET, RET, ROS1  PRIOR THERAPY:  1)  SRS to the brain completed on 09/03/2020 2) [facility] Completed in June 2023  CURRENT THERAPY: 1) Systemic chemotherapy with carboplatin for AUC of 5, Alimta 500 Mg/M2 and Keytruda 200 Mg IV every 3 weeks.  First dose August 13, 2020.  Status post 22 cycles.  Starting from cycle #5 he is on maintenance treatment with Alimta and Keytruda every 3 weeks.   INTERVAL HISTORY: JArden [id number].[id number]. male returns to the clinic today for a follow-up visit accompanied by his wife.  The patient was last seen in clinic 3 weeks ago by myself. In the interval since last being seen, [place] and was treated with azithromycin and steroids. He did not have any imaging performed.  He uses supplemental oxygen at night and as needed during the day.  He reports he has had some insomnia recently.  He also reports that he was on steroids for a prolonged period of time and gained a lot of weight and has  some puffiness around his face hands, and abdomen.  the patient is currently undergoing immunotherapy with Keytruda and Alimta.  He is tolerating this fairly well except he has some controlled nausea with his anti-emetic.  He does report diminished appetite without weight loss. He denies any fever, chills, or night sweats. He has stable/slightly worsening dyspnea on exertion. He had COVID 19 last year and radiation fibrosis/special lung disease so his breathing has never been the same since that time. He denies any chest pain or hemoptysis.  He is prescribed supplemental oxygen at night. He denies any vomiting, diarrhea, or constipation. Denies any rashes or skin changes.  He mentions he is supposed to see a neuro ophthalmologist in the near future due to a "shadow" in his right eye.  Patient recently had a restaging CT scan performed.  He is here today for evaluation before starting cycle #23.      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 ICD10 Conversion    ALLERGIES:  has No Known Allergies.  MEDICATIONS:  Current Outpatient Medications  Medication Sig Dispense Refill   acetaminophen (TYLENOL) 500 MG tablet Take 500 mg by mouth every 6 (six) hours as needed for headache.  albuterol (VENTOLIN HFA) 108 (90 Base) MCG/ACT inhaler Inhale 2 puffs into the lungs every 4 (four) hours as needed for wheezing or shortness of breath.     aspirin 81 MG EC tablet Take 81 mg by mouth daily.     Bacillus Coagulans-Inulin (PROBIOTIC) 1-250 BILLION-MG CAPS Take 1 tablet by mouth daily.     bisacodyl (DULCOLAX) 5 MG EC tablet Take 5 mg by  mouth 3 (three) times daily as needed for moderate constipation.     Cholecalciferol (D3 PO) Take 1 tablet by mouth daily.     coal tar-salicylic acid 2 % shampoo Apply topically daily as needed for itching.     DULoxetine (CYMBALTA) 30 MG capsule Take 30 mg by mouth daily.     fexofenadine (ALLEGRA) 180 MG tablet Take 180 mg by mouth daily as needed for allergies.     fluocinonide cream (LIDEX) 0.86 % Apply 1 application  topically 2 (two) times daily as needed (psroasis).     folic acid (FOLVITE) 1 MG tablet TAKE 1 TABLET(1 MG) BY MOUTH DAILY 30 tablet 4   hydrocortisone cream 1 % Apply 1 application topically 2 (two) times daily. (Patient taking differently: Apply 1 application  topically 2 (two) times daily as needed (rash).) 453.6 g 0   ketoconazole (NIZORAL) 2 % cream Apply 1 application  topically daily as needed (rash).     Krill Oil (OMEGA-3) 500 MG CAPS Take 1 capsule by mouth daily.     levothyroxine (SYNTHROID) 25 MCG tablet TAKE 1 TABLET(25 MCG) BY MOUTH DAILY BEFORE BREAKFAST 30 tablet 2   lidocaine-prilocaine (EMLA) cream Apply 1 application topically as needed. (Patient taking differently: Apply 1 application  topically daily as needed (access port).) 30 g 1   Magnesium 500 MG TABS Take 1 tablet by mouth every evening.     Niacin (VITAMIN B-3 PO) Take 1 tablet by mouth daily.     omeprazole (PRILOSEC) 40 MG capsule Take 40 mg by mouth every evening.     tamsulosin (FLOMAX) 0.4 MG CAPS capsule Take 0.4 mg by mouth at bedtime.     No current facility-administered medications for this visit.    SURGICAL HISTORY:  Past Surgical History:  Procedure Laterality Date   IR IMAGING GUIDED PORT INSERTION  11/24/2020   KNEE SURGERY     orthoscopic     TONSILLECTOMY AND ADENOIDECTOMY      REVIEW OF SYSTEMS:   Constitutional: Positive for stable fatigue and decreased appetite. Negative for chills, fever and unexpected weight change.  HENT: Negative for mouth sores, nosebleeds, sore  throat and trouble swallowing.   Eyes: Negative for icterus.  Positive for a "shadow in his right eye/vision" Respiratory: Positive for dyspnea on exertion. Negative for cough, hemoptysis, and wheezing.   Cardiovascular: Negative for chest pain and leg swelling.  Gastrointestinal: Positive for occasional nausea.  Negative for abdominal pain, constipation, diarrhea, and vomiting.  Genitourinary: Negative for bladder incontinence, difficulty urinating, dysuria, frequency and hematuria.   Musculoskeletal: Negative for back pain, gait problem, neck pain and neck stiffness.  Skin: Negative for itching and rash.  Neurological: Negative for dizziness, extremity weakness, gait problem, light-headedness and seizures.  Hematological: Negative for adenopathy. Does not bruise/bleed easily.  Psychiatric/Behavioral: Today for occasional sleep disturbance.  Negative for confusion, depression.  The patient is not nervous/anxious.    PHYSICAL EXAMINATION:  Blood pressure 122/70, pulse 94, temperature 97.9 F (36.6 C), temperature source Oral, resp. rate 16, weight 195 lb 8 oz (88.7 kg),  SpO2 97 %.  ECOG PERFORMANCE STATUS: 1  Physical Exam  Constitutional: Oriented to person, place, and time and well-developed, well-nourished, and in no distress.  HENT:  Head: Normocephalic and atraumatic.  Positive for some more prominent puffiness in the face and hands. Mouth/Throat: Oropharynx is clear and moist. No oropharyngeal exudate.  Eyes: Conjunctivae are normal. Right eye exhibits no discharge. Left eye exhibits no discharge. No scleral icterus.  Neck: Normal range of motion. Neck supple.  Cardiovascular: Normal rate, regular rhythm, normal heart sounds and intact distal pulses.   Pulmonary/Chest: Effort normal and breath sounds normal. No respiratory distress. No wheezes. No rales.  Abdominal: Soft. Bowel sounds are normal. Exhibits no distension and no mass. There is no tenderness.  Musculoskeletal: Normal  range of motion. Exhibits no significant lowe extremity edema.  Lymphadenopathy:    No cervical adenopathy.  Neurological: Alert and oriented to person, place, and time. Exhibits normal muscle tone.  Examined in the wheelchair. Skin: Skin is warm and dry. No rash noted. Not diaphoretic. No erythema. No pallor.  Psychiatric: Mood, memory and judgment normal.  Vitals reviewed.  LABORATORY DATA: Lab Results  Component Value Date   WBC 7.6 12/31/2021   HGB 10.4 (L) 12/31/2021   HCT 30.1 (L) 12/31/2021   MCV 95.9 12/31/2021   PLT 221 12/31/2021      Chemistry      Component Value Date/Time   NA 133 (L) 12/31/2021 1402   K 4.1 12/31/2021 1402   CL 101 12/31/2021 1402   CO2 26 12/31/2021 1402   BUN 14 12/31/2021 1402   CREATININE 0.93 12/31/2021 1402      Component Value Date/Time   CALCIUM 8.9 12/31/2021 1402   ALKPHOS 72 12/31/2021 1402   AST 18 12/31/2021 1402   ALT 8 12/31/2021 1402   BILITOT 0.3 12/31/2021 1402       RADIOGRAPHIC STUDIES:  CT Chest W Contrast  Result Date: 12/30/2021 CLINICAL DATA:  81 year old male history of lung cancer diagnosed in 2022 status post radiation therapy and chemotherapy. Follow-up study. * Tracking Code: BO * EXAM: CT CHEST, ABDOMEN, AND PELVIS WITH CONTRAST TECHNIQUE: Multidetector CT imaging of the chest, abdomen and pelvis was performed following the standard protocol during bolus administration of intravenous contrast. RADIATION DOSE REDUCTION: This exam was performed according to the departmental dose-optimization program which includes automated exposure control, adjustment of the mA and/or kV according to patient size and/or use of iterative reconstruction technique. CONTRAST:  118m OMNIPAQUE IOHEXOL 300 MG/ML  SOLN COMPARISON:  CT of the chest, abdomen and pelvis 10/22/2021. FINDINGS: CT CHEST FINDINGS Cardiovascular: Heart size is normal. There is no significant pericardial fluid, thickening or pericardial calcification. There is  aortic atherosclerosis, as well as atherosclerosis of the great vessels of the mediastinum and the coronary arteries, including calcified atherosclerotic plaque in the left main, left anterior descending, left circumflex and right coronary arteries. Thickening and calcification of the aortic valve. Right internal jugular single-lumen Port-A-Cath with tip terminating in the distal superior vena cava. Mediastinum/Nodes: Increasing prominence of anterior mediastinal lymph nodes measuring up to 1.2 cm in short axis adjacent to the anterior aspect of the left main pulmonary artery (axial image 27 of series 2). Esophagus is unremarkable in appearance. No axillary lymphadenopathy. 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 No definite suspicious appearing pulmonary nodules or masses are noted on today's examination. Widespread  areas of patchy ground-glass attenuation, septal thickening, thickening of the peribronchovascular interstitium, cylindrical bronchiectasis and peripheral bronchiolectasis are also noted throughout the lungs bilaterally, indicative of chronic interstitial lung disease. Trace right and small left pleural effusions lying dependently. Musculoskeletal: There are no aggressive appearing lytic or blastic lesions noted in the visualized portions of the skeleton. CT ABDOMEN PELVIS FINDINGS Hepatobiliary: No suspicious cystic or solid hepatic lesions. No intra or extrahepatic biliary ductal dilatation. Gallbladder is normal in appearance. Pancreas: No pancreatic mass. No pancreatic ductal dilatation. No pancreatic or peripancreatic fluid collections or inflammatory changes. Spleen: Unremarkable. Adrenals/Urinary Tract: 2.3 cm low-attenuation nonenhancing lesion in the anterior aspect of the interpolar region of the left kidney, compatible with a simple cyst. Right kidney  and bilateral adrenal glands are normal in appearance. No hydroureteronephrosis. Urinary bladder is unremarkable in appearance. Stomach/Bowel: The appearance of the stomach is normal. No pathologic dilatation of small bowel or colon. Numerous colonic diverticuli are noted without surrounding inflammatory changes to indicate an acute diverticulitis at this time. The appendix is not confidently identified and may be surgically absent. Regardless, there are no inflammatory changes noted adjacent to the cecum to suggest the presence of an acute appendicitis at this time. Vascular/Lymphatic: Atherosclerotic calcifications in the abdominal aorta and pelvic vasculature, without evidence of aneurysm or dissection. No lymphadenopathy noted in the abdomen or pelvis. Reproductive: Prostate gland and seminal vesicles are unremarkable in appearance. Other: No significant volume of ascites.  No pneumoperitoneum. 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 There are no aggressive appearing lytic or blastic lesions noted in the visualized portions of the skeleton. IMPRESSION: 1. Stable postradiation changes in the left lung with no definitive evidence to suggest locally recurrent disease or metastatic disease in the chest, abdomen or pelvis. There is slight prominence of several anterior mediastinal lymph nodes when compared to the prior examination. This is nonspecific, particularly in light of the concurrent interstitial lung disease, however, very close attention on follow-up studies is recommended to ensure stability or regression of these findings is recommended, as the possibility of nodal metastatic disease is not entirely excluded. 2. Aortic atherosclerosis, in addition to left main and three-vessel coronary artery disease. Please note that although the presence of coronary artery calcium documents the presence of coronary artery disease, the severity of this disease and any  potential stenosis cannot be assessed on this non-gated CT examination. Assessment for potential risk factor modification, dietary therapy or pharmacologic therapy may be warranted, if clinically indicated. 3. There are calcifications of the aortic valve. Echocardiographic correlation for evaluation of potential valvular dysfunction may be warranted if clinically indicated. 4. Colonic diverticulosis without evidence of acute diverticulitis at this time. 5. Additional incidental findings, as above. Electronically Signed   By: Vinnie Langton M.D.   On: 12/30/2021 08:52   CT Abdomen Pelvis W Contrast  Result Date: 12/30/2021 CLINICAL DATA:  81 year old male history of lung cancer diagnosed in 2022 status post radiation therapy and chemotherapy. Follow-up study. * Tracking Code: BO * EXAM: CT CHEST, ABDOMEN, AND PELVIS WITH CONTRAST TECHNIQUE: Multidetector CT imaging of the chest, abdomen and pelvis was performed following the standard protocol during bolus administration of intravenous contrast. RADIATION DOSE REDUCTION: This exam was performed according to the departmental dose-optimization program which includes automated exposure control, adjustment of the mA and/or kV according to patient size and/or use of iterative reconstruction technique. CONTRAST:  127m OMNIPAQUE IOHEXOL 300 MG/ML  SOLN COMPARISON:  CT of  the chest, abdomen and pelvis 10/22/2021. FINDINGS: CT CHEST FINDINGS Cardiovascular: Heart size is normal. There is no significant pericardial fluid, thickening or pericardial calcification. There is aortic atherosclerosis, as well as atherosclerosis of the great vessels of the mediastinum and the coronary arteries, including calcified atherosclerotic plaque in the left main, left anterior descending, left circumflex and right coronary arteries. Thickening and calcification of the aortic valve. Right internal jugular single-lumen Port-A-Cath with tip terminating in the distal superior vena cava.  Mediastinum/Nodes: Increasing prominence of anterior mediastinal lymph nodes measuring up to 1.2 cm in short axis adjacent to the anterior aspect of the left main pulmonary artery (axial image 27 of series 2). Esophagus is unremarkable in appearance. No axillary lymphadenopathy. 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 No definite suspicious appearing pulmonary nodules or masses are noted on today's examination. Widespread areas of patchy ground-glass attenuation, septal thickening, thickening of the peribronchovascular interstitium, cylindrical bronchiectasis and peripheral bronchiolectasis are also noted throughout the lungs bilaterally, indicative of chronic interstitial lung disease. Trace right and small left pleural effusions lying dependently. Musculoskeletal: There are no aggressive appearing lytic or blastic lesions noted in the visualized portions of the skeleton. CT ABDOMEN PELVIS FINDINGS Hepatobiliary: No suspicious cystic or solid hepatic lesions. No intra or extrahepatic biliary ductal dilatation. Gallbladder is normal in appearance. Pancreas: No pancreatic mass. No pancreatic ductal dilatation. No pancreatic or peripancreatic fluid collections or inflammatory changes. Spleen: Unremarkable. Adrenals/Urinary Tract: 2.3 cm low-attenuation nonenhancing lesion in the anterior aspect of the interpolar region of the left kidney, compatible with a simple cyst. Right kidney and bilateral adrenal glands are normal in appearance. No hydroureteronephrosis. Urinary bladder is unremarkable in appearance. Stomach/Bowel: The appearance of the stomach is normal. No pathologic dilatation of small bowel or colon. Numerous colonic diverticuli are noted without surrounding inflammatory changes to indicate an acute diverticulitis at this time. The appendix is not confidently  identified and may be surgically absent. Regardless, there are no inflammatory changes noted adjacent to the cecum to suggest the presence of an acute appendicitis at this time. Vascular/Lymphatic: Atherosclerotic calcifications in the abdominal aorta and pelvic vasculature, without evidence of aneurysm or dissection. No lymphadenopathy noted in the abdomen or pelvis. Reproductive: Prostate gland and seminal vesicles are unremarkable in appearance. Other: No significant volume of ascites.  No pneumoperitoneum. 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 There are no aggressive appearing lytic or blastic lesions noted in the visualized portions of the skeleton. IMPRESSION: 1. Stable postradiation changes in the left lung with no definitive evidence to suggest locally recurrent disease or metastatic disease in the chest, abdomen or pelvis. There is slight prominence of several anterior mediastinal lymph nodes when compared to the prior examination. This is nonspecific, particularly in light of the concurrent interstitial lung disease, however, very close attention on follow-up studies is recommended to ensure stability or regression of these findings is recommended, as the possibility of nodal metastatic disease is not entirely excluded. 2. Aortic atherosclerosis, in addition to left main and three-vessel coronary artery disease. Please note that although the presence of coronary artery calcium documents the presence of coronary artery disease, the severity of this disease and any potential stenosis cannot be assessed on this non-gated CT examination. Assessment for potential risk factor modification, dietary therapy or pharmacologic therapy may be warranted, if clinically indicated. 3. There  are calcifications of the aortic valve. Echocardiographic correlation for evaluation of potential valvular dysfunction may be warranted if clinically indicated. 4. Colonic  diverticulosis without evidence of acute diverticulitis at this time. 5. Additional incidental findings, as above. Electronically Signed   By: Vinnie Langton M.D.   On: 12/30/2021 08:52   MR BRAIN W WO CONTRAST  Result Date: 12/04/2021 CLINICAL DATA:  History of lung and brain cancer. Assess treatment response. EXAM: MRI HEAD WITHOUT AND WITH CONTRAST TECHNIQUE: Multiplanar, multiecho pulse sequences of the brain and surrounding structures were obtained without and with intravenous contrast. CONTRAST:  10 cc [employer] COMPARISON:  Brain MRI 10/02/2021 FINDINGS: Brain: The peripherally enhancing lesion in the left anterior temporal lobe today measures 2.8 cm AP x 2.2 cm TV by 2.7 cm cc, not significantly changed in size allowing for slight differences in measurement technique and slice selection. FLAIR signal abnormality surrounding the lesion throughout much of the temporal lobe extending to the internal and external capsules is again seen though overall decreased. Particularly, previously seen FLAIR signal abnormality in the peritrigonal white matter has resolved. There is no new or worsening mass effect. A small amount of intralesional SWI signal dropout is unchanged. The previously seen punctate focus of enhancement in the right cerebellum is not seen on the current study. There are no new lesions. There is no acute intracranial hemorrhage, extra-axial fluid collection, or acute infarct. Parenchymal volume is stable. The ventricles are stable in size. There is no midline shift. Vascular: Normal flow voids. Skull and upper cervical spine: Normal marrow signal. Sinuses/Orbits: The paranasal sinuses are clear. The globes and orbits are unremarkable. Other: None. IMPRESSION: 1. Stable size of the peripherally enhancing lesion in the left temporal lobe with decreased surrounding FLAIR signal abnormality. 2. Previously seen punctate lesion in the right cerebellum is not seen on the current study. No new lesions.  Electronically Signed   By: Valetta Mole M.D.   On: 12/04/2021 14:20     ASSESSMENT/PLAN:  This is a very pleasant 81 year old Caucasian male diagnosed with stage IV (T1b, N2, M1A) non-small cell lung cancer, adenocarcinoma.  The patient presented with a left upper lobe lung nodule in addition to mediastinal lymphadenopathy and pleural-based metastasis as well as a malignant left pleural effusion.  The patient was diagnosed in July 2022.  The patient also had a metastatic brain lesion.   The patient completed SRS to the brain lesion on 09/03/2020 under the care of Dr. [name].   The patient is currently undergoing systemic chemotherapy with carboplatin for an AUC of 5, Alimta 500 mg per metered squared, Keytruda 200 mg IV every 3 weeks.  The patient is status post 22 cycles.  Starting from cycle #5, the patient started maintenance Alimta and Keytruda  On the patient's restaging CT scan from 04/23/2021, the patient had an enlarging left upper lobe nodule.  The patient completed SBRT to this lesion in [place].  This was completed in June 2023     The patient recently had a restaging CT scan performed.  Dr. Julien Nordmann personally independently reviewed the scan discussed the results with the patient today.  The scan showed no evidence of disease progression.  There was also no acute process or evidence of pneumonia.  Dr. Julien Nordmann recommends that he proceed on the same treatment at the same dose.  We will see him back for follow-up visit and 3 weeks for evaluation and repeat blood work before undergoing cycle #24.  Will continue to follow with  neuro-oncology and ophthalmology.  He is expected to see a neuro oncologist in the near future.  The patient reports decreased appetite.  There is a DDI with Cymbalta and Remeron.  The patient is not interested in Megace due to the side effect profile.  The patient is going to just try to be conscientious to continue to eat even when he does not feel  hungry.  Insomnia may have been secondary to his recent steroid use which he has completed this course at this time.  Hoping that the puffiness will improve.  Regarding the abdominal bloating there was no evidence of ascites.  He will continue to follow with his pulmonologist in [facility].  His nausea is controlled with his antiemetic.  The patient was advised to call immediately if he has any concerning symptoms in the interval. The patient voices understanding of current disease status and treatment options and is in agreement with the current care plan. All questions were answered. The patient knows to call the clinic with any problems, questions or concerns. We can certainly see the patient much sooner if necessary       No orders of the defined types were placed in this encounter.    Vasilia Dise L Romero Letizia, PA-C 12/31/21  ADDENDUM: Hematology/Oncology Attending: I had a face-to-face encounter with the patient today.  I reviewed his record, lab, scan and recommended his care plan.  This is a very pleasant 81 years old white male with a stage IV non-small cell lung cancer, adenocarcinoma diagnosed in July 2022 status post SRS to solitary brain metastasis in addition to palliative radiotherapy to enlarging left upper lobe lung mass in [place] in June 2023.  The patient started systemic chemotherapy initially with carboplatin, Alimta and Keytruda on August 13, 2020 and starting from cycle #5 he has been on maintenance treatment with Alimta and Keytruda every 3 weeks.  He is status post total treatment of 22 cycles.  He has been tolerating his treatment fairly well with no concerning adverse effect except for fatigue.  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 He had repeat CT scan of the chest, abdomen and pelvis performed recently.  I personally and independently  reviewed the scan and discussed the result with the patient and his wife. His scan showed no concerning findings for disease progression. I recommended for the patient to continue his current treatment with the maintenance Alimta and Keytruda every 3 weeks and he will proceed with cycle #23 today. He will come back for follow-up visit in 3 weeks for evaluation before the next cycle of his treatment. The patient was advised to call immediately if he has any other concerning symptoms in the interval. The total time spent in the appointment was 30 minutes. Disclaimer: This note was dictated with voice recognition software. Similar sounding words can inadvertently be transcribed and may be missed upon review. Eilleen Kempf, MD

## 2021-12-30 ENCOUNTER — Ambulatory Visit: Payer: Medicare HMO | Admitting: Internal Medicine

## 2021-12-30 ENCOUNTER — Ambulatory Visit: Payer: Medicare HMO

## 2021-12-30 ENCOUNTER — Other Ambulatory Visit: Payer: Medicare HMO

## 2021-12-31 ENCOUNTER — Inpatient Hospital Stay (HOSPITAL_BASED_OUTPATIENT_CLINIC_OR_DEPARTMENT_OTHER): Payer: Medicare HMO | Admitting: Physician Assistant

## 2021-12-31 ENCOUNTER — Inpatient Hospital Stay: Payer: Medicare HMO

## 2021-12-31 VITALS — BP 122/70 | HR 94 | Temp 97.9°F | Resp 16 | Wt 195.5 lb

## 2021-12-31 DIAGNOSIS — Z5112 Encounter for antineoplastic immunotherapy: Secondary | ICD-10-CM

## 2021-12-31 DIAGNOSIS — Z95828 Presence of other vascular implants and grafts: Secondary | ICD-10-CM

## 2021-12-31 DIAGNOSIS — C3492 Malignant neoplasm of unspecified part of left bronchus or lung: Secondary | ICD-10-CM

## 2021-12-31 DIAGNOSIS — Z5111 Encounter for antineoplastic chemotherapy: Secondary | ICD-10-CM

## 2021-12-31 LAB — CMP (CANCER CENTER ONLY)
ALT: 8 U/L (ref 0–44)
AST: 18 U/L (ref 15–41)
Albumin: 3.6 g/dL (ref 3.5–5.0)
Alkaline Phosphatase: 72 U/L (ref 38–126)
Anion gap: 6 (ref 5–15)
BUN: 14 mg/dL (ref 8–23)
CO2: 26 mmol/L (ref 22–32)
Calcium: 8.9 mg/dL (ref 8.9–10.3)
Chloride: 101 mmol/L (ref 98–111)
Creatinine: 0.93 mg/dL (ref 0.61–1.24)
GFR, Estimated: >60 mL/min (ref >60)
Glucose, Bld: 173 mg/dL — ABNORMAL HIGH (ref 70–99)
Potassium: 4.1 mmol/L (ref 3.5–5.1)
Sodium: 133 mmol/L — ABNORMAL LOW (ref 135–145)
Total Bilirubin: 0.3 mg/dL (ref 0.3–1.2)
Total Protein: 6.2 g/dL — ABNORMAL LOW (ref 6.5–8.1)

## 2021-12-31 LAB — CBC WITH DIFFERENTIAL (CANCER CENTER ONLY)
Abs Immature Granulocytes: 0.05 10*3/uL (ref 0.00–0.07)
Basophils Absolute: 0.1 10*3/uL (ref 0.0–0.1)
Basophils Relative: 1 %
Eosinophils Absolute: 0.7 10*3/uL — ABNORMAL HIGH (ref 0.0–0.5)
Eosinophils Relative: 9 %
HCT: 30.1 % — ABNORMAL LOW (ref 39.0–52.0)
Hemoglobin: 10.4 g/dL — ABNORMAL LOW (ref 13.0–17.0)
Immature Granulocytes: 1 %
Lymphocytes Relative: 28 %
Lymphs Abs: 2.1 10*3/uL (ref 0.7–4.0)
MCH: 33.1 pg (ref 26.0–34.0)
MCHC: 34.6 g/dL (ref 30.0–36.0)
MCV: 95.9 fL (ref 80.0–100.0)
Monocytes Absolute: 1.3 10*3/uL — ABNORMAL HIGH (ref 0.1–1.0)
Monocytes Relative: 18 %
Neutro Abs: 3.3 10*3/uL (ref 1.7–7.7)
Neutrophils Relative %: 43 %
Platelet Count: 221 10*3/uL (ref 150–400)
RBC: 3.14 MIL/uL — ABNORMAL LOW (ref 4.22–5.81)
RDW: 14.3 % (ref 11.5–15.5)
WBC Count: 7.6 10*3/uL (ref 4.0–10.5)
nRBC: 0 % (ref 0.0–0.2)

## 2021-12-31 MED ORDER — PROCHLORPERAZINE MALEATE 10 MG PO TABS
10.0000 mg | ORAL_TABLET | Freq: Once | ORAL | Status: AC
  Administered 2021-12-31: 10 mg via ORAL
  Filled 2021-12-31: qty 1

## 2021-12-31 MED ORDER — HEPARIN SOD (PORK) LOCK FLUSH 100 UNIT/ML IV SOLN
500.0000 [IU] | Freq: Once | INTRAVENOUS | Status: AC | PRN
  Administered 2021-12-31: 500 [IU]

## 2021-12-31 MED ORDER — SODIUM CHLORIDE 0.9% FLUSH
10.0000 mL | Freq: Once | INTRAVENOUS | Status: AC
  Administered 2021-12-31: 10 mL

## 2021-12-31 MED ORDER — SODIUM CHLORIDE 0.9 % IV SOLN
500.0000 mg/m2 | Freq: Once | INTRAVENOUS | Status: AC
  Administered 2021-12-31: 1000 mg via INTRAVENOUS
  Filled 2021-12-31: qty 40

## 2021-12-31 MED ORDER — SODIUM CHLORIDE 0.9 % IV SOLN
200.0000 mg | Freq: Once | INTRAVENOUS | Status: AC
  Administered 2021-12-31: 200 mg via INTRAVENOUS
  Filled 2021-12-31: qty 200

## 2021-12-31 MED ORDER — SODIUM CHLORIDE 0.9 % IV SOLN: Freq: Once | INTRAVENOUS | Status: AC

## 2021-12-31 MED ORDER — SODIUM CHLORIDE 0.9% FLUSH
10.0000 mL | INTRAVENOUS | Status: DC | PRN
  Administered 2021-12-31: 10 mL

## 2021-12-31 NOTE — Patient Instructions (Signed)
[facility]  Discharge Instructions: Thank you for choosing [facility] to provide your oncology and hematology care.   If you have a lab appointment with the [facility], please go directly to the [facility] and check in at the registration area.   Wear comfortable clothing and clothing appropriate for easy access to any Portacath or PICC line.   We strive to give you quality time with your provider. You may need to reschedule your appointment if you arrive late (15 or more minutes).  Arriving late affects you and other patients whose appointments are after yours.  Also, if you miss three or more appointments without notifying the office, you may be dismissed from the clinic at the provider's discretion.      For prescription refill requests, have your pharmacy contact our office and allow 72 hours for refills to be completed.    Today you received the following chemotherapy and/or immunotherapy agents: Keytruda and Alimta      To help prevent nausea and vomiting after your treatment, we encourage you to take your nausea medication as directed.  BELOW ARE SYMPTOMS THAT SHOULD BE REPORTED IMMEDIATELY: *FEVER GREATER THAN 100.4 F (38 C) OR HIGHER *CHILLS OR SWEATING *NAUSEA AND VOMITING THAT IS NOT CONTROLLED WITH YOUR NAUSEA MEDICATION *UNUSUAL SHORTNESS OF BREATH *UNUSUAL BRUISING OR BLEEDING *URINARY PROBLEMS (pain or burning when urinating, or frequent urination) *BOWEL PROBLEMS (unusual diarrhea, constipation, pain near the anus) TENDERNESS IN MOUTH AND THROAT WITH OR WITHOUT PRESENCE OF ULCERS (sore throat, sores in mouth, or a toothache) UNUSUAL RASH, SWELLING OR PAIN  UNUSUAL VAGINAL DISCHARGE OR ITCHING   Items with * indicate a potential emergency and should be followed up as soon as possible or go to the Emergency Department if any problems should occur.  Please show the CHEMOTHERAPY ALERT CARD or IMMUNOTHERAPY ALERT CARD at  check-in to the Emergency Department and triage nurse.  Should you have questions after your visit or need to cancel or reschedule your appointment, please contact [facility]  Dept: [phone]  and follow the prompts.  Office hours are 8:00 a.m. to 4:30 p.m. Monday - Friday. Please note that voicemails left after 4:00 p.m. may not be returned until the following business day.  We are closed weekends and major holidays. You have access to a nurse at all times for urgent questions. Please call the main number to the clinic Dept: [phone] and follow the prompts.   For any non-urgent questions, you may also contact your provider using MyChart. We now offer e-Visits for anyone 22 and older to request care online for non-urgent symptoms. For details visit [email or web address].   Also download the MyChart app! Go to the app store, search "MyChart", open the app, select [facility], and log in with your MyChart username and password.

## 2022-01-01 ENCOUNTER — Telehealth: Payer: Self-pay | Admitting: Internal Medicine

## 2022-01-01 ENCOUNTER — Encounter: Payer: Self-pay | Admitting: Internal Medicine

## 2022-01-01 NOTE — Telephone Encounter (Signed)
Called patient regarding all upcoming appointments, spoke with patient's spouse. Patient is notified.

## 2022-01-09 ENCOUNTER — Other Ambulatory Visit: Payer: Self-pay | Admitting: Physician Assistant

## 2022-01-09 DIAGNOSIS — E039 Hypothyroidism, unspecified: Secondary | ICD-10-CM

## 2022-01-17 ENCOUNTER — Encounter: Payer: Self-pay | Admitting: Internal Medicine

## 2022-01-18 ENCOUNTER — Telehealth: Payer: Self-pay | Admitting: Medical Oncology

## 2022-01-18 NOTE — Telephone Encounter (Signed)
Swelling-3 days ago noticed chest swelling "6 in above left nipple" .The area is" 3-4 in long" and sore to touch. The soreness goes under left armpit around to back. When he gets up it is  painful and feels "stretched" He denies fever or rash . Tylenol 100 mg and heating pad do not help.  Per Dr Arbutus Ped I told pt to keep appt Thursday and if his symptoms worsen he needs to call and go to ED. Wife voiced understanding.

## 2022-01-20 ENCOUNTER — Inpatient Hospital Stay: Payer: Medicare HMO | Attending: Internal Medicine | Admitting: Internal Medicine

## 2022-01-20 ENCOUNTER — Inpatient Hospital Stay: Payer: Medicare HMO

## 2022-01-20 ENCOUNTER — Encounter: Payer: Self-pay | Admitting: Internal Medicine

## 2022-01-20 ENCOUNTER — Other Ambulatory Visit: Payer: Self-pay | Admitting: Internal Medicine

## 2022-01-20 ENCOUNTER — Other Ambulatory Visit: Payer: Medicare HMO

## 2022-01-20 VITALS — BP 130/72 | HR 79 | Temp 98.1°F | Resp 18 | Wt 197.1 lb

## 2022-01-20 DIAGNOSIS — Z5111 Encounter for antineoplastic chemotherapy: Secondary | ICD-10-CM | POA: Insufficient documentation

## 2022-01-20 DIAGNOSIS — Z79899 Other long term (current) drug therapy: Secondary | ICD-10-CM | POA: Insufficient documentation

## 2022-01-20 DIAGNOSIS — Z5112 Encounter for antineoplastic immunotherapy: Secondary | ICD-10-CM | POA: Diagnosis present

## 2022-01-20 DIAGNOSIS — C782 Secondary malignant neoplasm of pleura: Secondary | ICD-10-CM | POA: Insufficient documentation

## 2022-01-20 DIAGNOSIS — C3412 Malignant neoplasm of upper lobe, left bronchus or lung: Secondary | ICD-10-CM | POA: Insufficient documentation

## 2022-01-20 DIAGNOSIS — C7931 Secondary malignant neoplasm of brain: Secondary | ICD-10-CM

## 2022-01-20 DIAGNOSIS — C3492 Malignant neoplasm of unspecified part of left bronchus or lung: Secondary | ICD-10-CM

## 2022-01-20 DIAGNOSIS — Z95828 Presence of other vascular implants and grafts: Secondary | ICD-10-CM

## 2022-01-20 LAB — CMP (CANCER CENTER ONLY)
ALT: 5 U/L (ref 0–44)
AST: 17 U/L (ref 15–41)
Albumin: 3.3 g/dL — ABNORMAL LOW (ref 3.5–5.0)
Alkaline Phosphatase: 75 U/L (ref 38–126)
Anion gap: 5 (ref 5–15)
BUN: 12 mg/dL (ref 8–23)
CO2: 27 mmol/L (ref 22–32)
Calcium: 9.3 mg/dL (ref 8.9–10.3)
Chloride: 103 mmol/L (ref 98–111)
Creatinine: 0.88 mg/dL (ref 0.61–1.24)
GFR, Estimated: >60 mL/min (ref >60)
Glucose, Bld: 96 mg/dL (ref 70–99)
Potassium: 4.1 mmol/L (ref 3.5–5.1)
Sodium: 135 mmol/L (ref 135–145)
Total Bilirubin: 0.3 mg/dL (ref 0.3–1.2)
Total Protein: 6 g/dL — ABNORMAL LOW (ref 6.5–8.1)

## 2022-01-20 LAB — CBC WITH DIFFERENTIAL (CANCER CENTER ONLY)
Abs Immature Granulocytes: 0.01 10*3/uL (ref 0.00–0.07)
Basophils Absolute: 0.1 10*3/uL (ref 0.0–0.1)
Basophils Relative: 1 %
Eosinophils Absolute: 0.6 10*3/uL — ABNORMAL HIGH (ref 0.0–0.5)
Eosinophils Relative: 11 %
HCT: 29.4 % — ABNORMAL LOW (ref 39.0–52.0)
Hemoglobin: 9.8 g/dL — ABNORMAL LOW (ref 13.0–17.0)
Immature Granulocytes: 0 %
Lymphocytes Relative: 24 %
Lymphs Abs: 1.4 10*3/uL (ref 0.7–4.0)
MCH: 31.6 pg (ref 26.0–34.0)
MCHC: 33.3 g/dL (ref 30.0–36.0)
MCV: 94.8 fL (ref 80.0–100.0)
Monocytes Absolute: 0.9 10*3/uL (ref 0.1–1.0)
Monocytes Relative: 16 %
Neutro Abs: 2.9 10*3/uL (ref 1.7–7.7)
Neutrophils Relative %: 48 %
Platelet Count: 270 10*3/uL (ref 150–400)
RBC: 3.1 MIL/uL — ABNORMAL LOW (ref 4.22–5.81)
RDW: 14.7 % (ref 11.5–15.5)
WBC Count: 5.9 10*3/uL (ref 4.0–10.5)
nRBC: 0 % (ref 0.0–0.2)

## 2022-01-20 LAB — TSH: TSH: 5.177 u[IU]/mL — ABNORMAL HIGH (ref 0.350–4.500)

## 2022-01-20 MED ORDER — OXYCODONE-ACETAMINOPHEN 5-325 MG PO TABS: 1.0000 | ORAL_TABLET | Freq: Three times a day (TID) | ORAL | 0 refills | Status: DC | PRN

## 2022-01-20 MED ORDER — OXYCODONE HCL 5 MG PO TABS
5.0000 mg | ORAL_TABLET | Freq: Once | ORAL | Status: AC
  Administered 2022-01-20: 5 mg via ORAL
  Filled 2022-01-20: qty 1

## 2022-01-20 MED ORDER — SODIUM CHLORIDE 0.9 % IV SOLN
200.0000 mg | Freq: Once | INTRAVENOUS | Status: AC
  Administered 2022-01-20: 200 mg via INTRAVENOUS
  Filled 2022-01-20: qty 200

## 2022-01-20 MED ORDER — SODIUM CHLORIDE 0.9 % IV SOLN: Freq: Once | INTRAVENOUS | Status: AC

## 2022-01-20 MED ORDER — SODIUM CHLORIDE 0.9% FLUSH
10.0000 mL | Freq: Once | INTRAVENOUS | Status: AC
  Administered 2022-01-20: 10 mL

## 2022-01-20 MED ORDER — CYANOCOBALAMIN 1000 MCG/ML IJ SOLN
1000.0000 ug | Freq: Once | INTRAMUSCULAR | Status: AC
  Administered 2022-01-20: 1000 ug via INTRAMUSCULAR
  Filled 2022-01-20: qty 1

## 2022-01-20 MED ORDER — HEPARIN SOD (PORK) LOCK FLUSH 100 UNIT/ML IV SOLN
500.0000 [IU] | Freq: Once | INTRAVENOUS | Status: AC | PRN
  Administered 2022-01-20: 500 [IU]

## 2022-01-20 MED ORDER — SODIUM CHLORIDE 0.9 % IV SOLN
500.0000 mg/m2 | Freq: Once | INTRAVENOUS | Status: AC
  Administered 2022-01-20: 1000 mg via INTRAVENOUS
  Filled 2022-01-20: qty 40

## 2022-01-20 MED ORDER — PROCHLORPERAZINE MALEATE 10 MG PO TABS
10.0000 mg | ORAL_TABLET | Freq: Once | ORAL | Status: AC
  Administered 2022-01-20: 10 mg via ORAL
  Filled 2022-01-20: qty 1

## 2022-01-20 MED ORDER — SODIUM CHLORIDE 0.9% FLUSH
10.0000 mL | INTRAVENOUS | Status: DC | PRN
  Administered 2022-01-20: 10 mL

## 2022-01-20 NOTE — Progress Notes (Signed)
[facility] [phone]   [phone]) [phone]  OFFICE PROGRESS NOTE  Kathlee Nations, MD [address] [place] [address]  DIAGNOSIS: Stage IV (T1b, N2, M1a) non-small cell lung cancer, adenocarcinoma diagnosed in July 2022 and presented with left upper lobe nodule in addition to AP window lymphadenopathy and left-sided malignant pleural effusion as well as pleural metastatic disease.  The patient also has solitary left temporal brain metastasis.  Biomarker Findings Microsatellite status - MS-Stable Tumor Mutational Burden - 4 Muts/Mb Genomic Findings For a complete list of the genes assayed, please refer to the Appendix. PIK3CA E545K PTEN splice site 635-1G>A - subclonal? DOT1L S911L - subclonal? RAD21 S271* RB1 loss exons 3-23 TP53 N325fs*34 8 Disease relevant genes with no reportable alterations: ALK, BRAF, EGFR, ERBB2, KRAS, MET, RET, ROS1  PRIOR THERAPY: palliative radiotherapy to the enlarging left upper lobe lung mass at the [facility] by radiation oncology.  CURRENT THERAPY: Systemic chemotherapy with carboplatin for AUC of 5, Alimta 500 Mg/M2 and Keytruda 200 Mg IV every 3 weeks.  First dose August 13, 2020.  Status post 23 cycles.  Starting from cycle #5 he is on maintenance treatment with Alimta and Keytruda every 3 weeks.  INTERVAL HISTORY: Peter Brown 82 y.o. male returns to the clinic today for follow-up visit accompanied by his wife.  The patient is feeling fine except for dull aching pain in the anterior left chest at the breast area.  He denied having any shortness of breath, cough or hemoptysis.  He has no current nausea, vomiting, diarrhea or constipation.  He has no headache or visual changes.  He has no significant weight loss or night sweats.  He is here today for evaluation before starting cycle #24 of his treatment with maintenance Alimta and Keytruda.   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 ICD10 Conversion    ALLERGIES:  has No Known Allergies.  MEDICATIONS:  Current Outpatient Medications  Medication Sig Dispense Refill   acetaminophen (TYLENOL) 500 MG tablet Take 500 mg by mouth every 6 (six) hours as needed for headache.     albuterol (VENTOLIN HFA) 108 (90 Base) MCG/ACT inhaler Inhale 2 puffs into the lungs every 4 (four) hours as needed for wheezing or shortness of breath.     aspirin 81 MG EC tablet Take 81 mg by mouth daily.     Bacillus Coagulans-Inulin (PROBIOTIC) 1-250 BILLION-MG CAPS Take 1 tablet by mouth daily.     bisacodyl (DULCOLAX) 5 MG EC tablet Take 5 mg by mouth 3 (three) times daily as needed for moderate constipation.     Cholecalciferol (D3 PO) Take 1 tablet by mouth daily.     coal tar-salicylic acid 2 % shampoo Apply topically daily as needed for itching.     DULoxetine (CYMBALTA) 30 MG capsule Take 30 mg by mouth daily.     fexofenadine (ALLEGRA) 180 MG tablet Take 180 mg by mouth daily as needed for allergies.     fluocinonide cream (LIDEX) 0.05 % Apply 1 application  topically 2 (two) times daily as needed (psroasis).     folic acid (  FOLVITE) 1 MG tablet TAKE 1 TABLET(1 MG) BY MOUTH DAILY 30 tablet 4   hydrocortisone cream 1 % Apply 1 application topically 2 (two) times daily. (Patient taking differently: Apply 1 application  topically 2 (two) times daily as needed (rash).) 453.6 g 0   ketoconazole (NIZORAL) 2 % cream Apply 1 application  topically daily as needed (rash).     Krill Oil (OMEGA-3) 500 MG CAPS Take 1 capsule by mouth daily.     levothyroxine (SYNTHROID) 25 MCG tablet TAKE 1 TABLET(25 MCG)  BY MOUTH DAILY BEFORE BREAKFAST 30 tablet 2   lidocaine-prilocaine (EMLA) cream Apply 1 application topically as needed. (Patient taking differently: Apply 1 application  topically daily as needed (access port).) 30 g 1   Magnesium 500 MG TABS Take 1 tablet by mouth every evening.     Niacin (VITAMIN B-3 PO) Take 1 tablet by mouth daily.     omeprazole (PRILOSEC) 40 MG capsule Take 40 mg by mouth every evening.     tamsulosin (FLOMAX) 0.4 MG CAPS capsule Take 0.4 mg by mouth at bedtime.     No current facility-administered medications for this visit.    SURGICAL HISTORY:  Past Surgical History:  Procedure Laterality Date   IR IMAGING GUIDED PORT INSERTION  11/24/2020   KNEE SURGERY     orthoscopic     TONSILLECTOMY AND ADENOIDECTOMY      REVIEW OF SYSTEMS:  A comprehensive review of systems was negative except for: Constitutional: positive for fatigue Respiratory: positive for pleurisy/chest pain   PHYSICAL EXAMINATION: General appearance: alert, cooperative, fatigued, and no distress Head: Normocephalic, without obvious abnormality, atraumatic Neck: no adenopathy, no JVD, supple, symmetrical, trachea midline, and thyroid not enlarged, symmetric, no tenderness/mass/nodules Lymph nodes: Cervical, supraclavicular, and axillary nodes normal. Resp: clear to auscultation bilaterally Back: symmetric, no curvature. ROM normal. No CVA tenderness. Cardio: regular rate and rhythm, S1, S2 normal, no murmur, click, rub or gallop GI: soft, non-tender; bowel sounds normal; no masses,  no organomegaly Extremities: extremities normal, atraumatic, no cyanosis or edema  ECOG PERFORMANCE STATUS: 1 - Symptomatic but completely ambulatory  Blood pressure 130/72, pulse 79, temperature 98.1 F (36.7 C), temperature source Oral, resp. rate 18, weight 197 lb 1 oz (89.4 kg), SpO2 96 %.  LABORATORY DATA: Lab Results  Component Value Date   WBC 5.9 01/20/2022   HGB 9.8 (L) 01/20/2022   HCT 29.4 (L)  01/20/2022   MCV 94.8 01/20/2022   PLT 270 01/20/2022      Chemistry      Component Value Date/Time   NA 133 (L) 12/31/2021 1402   K 4.1 12/31/2021 1402   CL 101 12/31/2021 1402   CO2 26 12/31/2021 1402   BUN 14 12/31/2021 1402   CREATININE 0.93 12/31/2021 1402      Component Value Date/Time   CALCIUM 8.9 12/31/2021 1402   ALKPHOS 72 12/31/2021 1402   AST 18 12/31/2021 1402   ALT 8 12/31/2021 1402   BILITOT 0.3 12/31/2021 1402       RADIOGRAPHIC STUDIES: CT Chest W Contrast  Result Date: 12/30/2021 CLINICAL DATA:  82 year old male history of lung cancer diagnosed in 2022 status post radiation therapy and chemotherapy. Follow-up study. * Tracking Code: BO * EXAM: CT CHEST, ABDOMEN, AND PELVIS WITH CONTRAST TECHNIQUE: Multidetector CT imaging of the chest, abdomen and pelvis was performed following the standard protocol during bolus administration of intravenous contrast. RADIATION DOSE REDUCTION: This exam was performed according to the departmental dose-optimization program which  includes automated exposure control, adjustment of the mA and/or kV according to patient size and/or use of iterative reconstruction technique. CONTRAST:  OMNIPAQUE IOHEXOL 300 MG/ML  SOLN COMPARISON:  CT of the chest, abdomen and pelvis 10/22/2021. FINDINGS: CT CHEST FINDINGS Cardiovascular: Heart size is normal. There is no significant pericardial fluid, thickening or pericardial calcification. There is aortic atherosclerosis, as well as atherosclerosis of the great vessels of the mediastinum and the coronary arteries, including calcified atherosclerotic plaque in the left main, left anterior descending, left circumflex and right coronary arteries. Thickening and calcification of the aortic valve. Right internal jugular single-lumen Port-A-Cath with tip terminating in the distal superior vena cava. Mediastinum/Nodes: Increasing prominence of anterior mediastinal lymph nodes measuring up to 1.2 cm in  short axis adjacent to the anterior aspect of the left main pulmonary artery (axial image 27 of series 2). Esophagus is unremarkable in appearance. No axillary lymphadenopathy. 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 No definite suspicious appearing pulmonary nodules or masses are noted on today's examination. Widespread areas of patchy ground-glass attenuation, septal thickening, thickening of the peribronchovascular interstitium, cylindrical bronchiectasis and peripheral bronchiolectasis are also noted throughout the lungs bilaterally, indicative of chronic interstitial lung disease. Trace right and small left pleural effusions lying dependently. Musculoskeletal: There are no aggressive appearing lytic or blastic lesions noted in the visualized portions of the skeleton. CT ABDOMEN PELVIS FINDINGS Hepatobiliary: No suspicious cystic or solid hepatic lesions. No intra or extrahepatic biliary ductal dilatation. Gallbladder is normal in appearance. Pancreas: No pancreatic mass. No pancreatic ductal dilatation. No pancreatic or peripancreatic fluid collections or inflammatory changes. Spleen: Unremarkable. Adrenals/Urinary Tract: 2.3 cm low-attenuation nonenhancing lesion in the anterior aspect of the interpolar region of the left kidney, compatible with a simple cyst. Right kidney and bilateral adrenal glands are normal in appearance. No hydroureteronephrosis. Urinary bladder is unremarkable in appearance. Stomach/Bowel: The appearance of the stomach is normal. No pathologic dilatation of small bowel or colon. Numerous colonic diverticuli are noted without surrounding inflammatory changes to indicate an acute diverticulitis at this time. The appendix is not confidently identified and may be surgically absent. Regardless, there are no inflammatory changes noted adjacent to the  cecum to suggest the presence of an acute appendicitis at this time. Vascular/Lymphatic: Atherosclerotic calcifications in the abdominal aorta and pelvic vasculature, without evidence of aneurysm or dissection. No lymphadenopathy noted in the abdomen or pelvis. Reproductive: Prostate gland and seminal vesicles are unremarkable in appearance. Other: No significant volume of ascites.  No pneumoperitoneum. 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 There are no aggressive appearing lytic or blastic lesions noted in the visualized portions of the skeleton. IMPRESSION: 1. Stable postradiation changes in the left lung with no definitive evidence to suggest locally recurrent disease or metastatic disease in the chest, abdomen or pelvis. There is slight prominence of several anterior mediastinal lymph nodes when compared to the prior examination. This is nonspecific, particularly in light of the concurrent interstitial lung disease, however, very close attention on follow-up studies is recommended to ensure stability or regression of these findings is recommended, as the possibility of nodal metastatic disease is not entirely excluded. 2. Aortic atherosclerosis, in addition to left main and three-vessel coronary artery disease. Please note that although the presence of coronary artery calcium documents the presence of coronary artery disease, the severity of  this disease and any potential stenosis cannot be assessed on this non-gated CT examination. Assessment for potential risk factor modification, dietary therapy or pharmacologic therapy may be warranted, if clinically indicated. 3. There are calcifications of the aortic valve. Echocardiographic correlation for evaluation of potential valvular dysfunction may be warranted if clinically indicated. 4. Colonic diverticulosis without evidence of acute diverticulitis at this time. 5. Additional incidental findings, as above.  Electronically Signed   By: Vinnie Langton M.D.   On: 12/30/2021 08:52   CT Abdomen Pelvis W Contrast  Result Date: 12/30/2021 CLINICAL DATA:  82 year old male history of lung cancer diagnosed in 2022 status post radiation therapy and chemotherapy. Follow-up study. * Tracking Code: BO * EXAM: CT CHEST, ABDOMEN, AND PELVIS WITH CONTRAST TECHNIQUE: Multidetector CT imaging of the chest, abdomen and pelvis was performed following the standard protocol during bolus administration of intravenous contrast. RADIATION DOSE REDUCTION: This exam was performed according to the departmental dose-optimization program which includes automated exposure control, adjustment of the mA and/or kV according to patient size and/or use of iterative reconstruction technique. CONTRAST:  198mL OMNIPAQUE IOHEXOL 300 MG/ML  SOLN COMPARISON:  CT of the chest, abdomen and pelvis 10/22/2021. FINDINGS: CT CHEST FINDINGS Cardiovascular: Heart size is normal. There is no significant pericardial fluid, thickening or pericardial calcification. There is aortic atherosclerosis, as well as atherosclerosis of the great vessels of the mediastinum and the coronary arteries, including calcified atherosclerotic plaque in the left main, left anterior descending, left circumflex and right coronary arteries. Thickening and calcification of the aortic valve. Right internal jugular single-lumen Port-A-Cath with tip terminating in the distal superior vena cava. Mediastinum/Nodes: Increasing prominence of anterior mediastinal lymph nodes measuring up to 1.2 cm in short axis adjacent to the anterior aspect of the left main pulmonary artery (axial image 27 of series 2). Esophagus is unremarkable in appearance. No axillary lymphadenopathy. 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 No definite suspicious appearing pulmonary  nodules or masses are noted on today's examination. Widespread areas of patchy ground-glass attenuation, septal thickening, thickening of the peribronchovascular interstitium, cylindrical bronchiectasis and peripheral bronchiolectasis are also noted throughout the lungs bilaterally, indicative of chronic interstitial lung disease. Trace right and small left pleural effusions lying dependently. Musculoskeletal: There are no aggressive appearing lytic or blastic lesions noted in the visualized portions of the skeleton. CT ABDOMEN PELVIS FINDINGS Hepatobiliary: No suspicious cystic or solid hepatic lesions. No intra or extrahepatic biliary ductal dilatation. Gallbladder is normal in appearance. Pancreas: No pancreatic mass. No pancreatic ductal dilatation. No pancreatic or peripancreatic fluid collections or inflammatory changes. Spleen: Unremarkable. Adrenals/Urinary Tract: 2.3 cm low-attenuation nonenhancing lesion in the anterior aspect of the interpolar region of the left kidney, compatible with a simple cyst. Right kidney and bilateral adrenal glands are normal in appearance. No hydroureteronephrosis. Urinary bladder is unremarkable in appearance. Stomach/Bowel: The appearance of the stomach is normal. No pathologic dilatation of small bowel or colon. Numerous colonic diverticuli are noted without surrounding inflammatory changes to indicate an acute diverticulitis at this time. The appendix is not confidently identified and may be surgically absent. Regardless, there are no inflammatory changes noted adjacent to the cecum to suggest the presence of an acute appendicitis at this time. Vascular/Lymphatic: Atherosclerotic calcifications in the abdominal aorta and pelvic vasculature, without evidence of aneurysm or dissection. No lymphadenopathy noted in the abdomen or pelvis. Reproductive: Prostate gland and seminal  vesicles are unremarkable in appearance. Other: No significant volume of ascites.  No  pneumoperitoneum. 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 There are no aggressive appearing lytic or blastic lesions noted in the visualized portions of the skeleton. IMPRESSION: 1. Stable postradiation changes in the left lung with no definitive evidence to suggest locally recurrent disease or metastatic disease in the chest, abdomen or pelvis. There is slight prominence of several anterior mediastinal lymph nodes when compared to the prior examination. This is nonspecific, particularly in light of the concurrent interstitial lung disease, however, very close attention on follow-up studies is recommended to ensure stability or regression of these findings is recommended, as the possibility of nodal metastatic disease is not entirely excluded. 2. Aortic atherosclerosis, in addition to left main and three-vessel coronary artery disease. Please note that although the presence of coronary artery calcium documents the presence of coronary artery disease, the severity of this disease and any potential stenosis cannot be assessed on this non-gated CT examination. Assessment for potential risk factor modification, dietary therapy or pharmacologic therapy may be warranted, if clinically indicated. 3. There are calcifications of the aortic valve. Echocardiographic correlation for evaluation of potential valvular dysfunction may be warranted if clinically indicated. 4. Colonic diverticulosis without evidence of acute diverticulitis at this time. 5. Additional incidental findings, as above. Electronically Signed   By: Trudie Reed M.D.   On: 12/30/2021 08:52    ASSESSMENT AND PLAN: This is a very pleasant 82 years old white male recently diagnosed with a stage IV (T1b, N2, M1a) non-small cell lung cancer, adenocarcinoma presented with left upper lobe lung nodule in addition to mediastinal lymphadenopathy and pleural-based metastasis as well as malignant left pleural  effusion diagnosed in July 2022. His molecular studies by foundation 1 showed no actionable mutations. The patient is currently undergoing systemic chemotherapy with carboplatin for AUC of 5, Alimta 500 Mg/M2 and Keytruda 200 Mg IV every 3 weeks.  He is status post 23 cycles.  Starting from cycle #5 the patient will be on treatment with Alimta and Keytruda every 3 weeks.  The patient has been tolerating this treatment well with no concerning adverse effect except for mild fatigue and occasional nausea. He underwent palliative radiotherapy to the enlarging left upper lobe lung mass by radiation oncology in [place]. I recommended for him to proceed with cycle #24 today as planned. For the anterior left chest pain, this is likely from his previous radiotherapy.  I will start the patient on Percocet 5/325 mg p.o. every 8 hours as needed for pain. For the brain metastasis he is followed by radiation oncology as well as neurooncology.  He will come back for follow-up visit in 3 weeks for evaluation before the next cycle of his treatment. He was advised to call immediately if he has any concerning symptoms in the interval. The patient voices understanding of current disease status and treatment options and is in agreement with the current care plan.  All questions were answered. The patient knows to call the clinic with any problems, questions or concerns. We can certainly see the patient much sooner if necessary.  Disclaimer: This note was dictated with voice recognition software. Similar sounding words can inadvertently be transcribed and may not be corrected upon review.

## 2022-01-20 NOTE — Patient Instructions (Signed)
[facility] MEDICAL ONCOLOGY  Discharge Instructions: Thank you for choosing [facility] to provide your oncology and hematology care.   If you have a lab appointment with the Cancer Center, please go directly to the Cancer Center and check in at the registration area.   Wear comfortable clothing and clothing appropriate for easy access to any Portacath or PICC line.   We strive to give you quality time with your provider. You may need to reschedule your appointment if you arrive late (15 or more minutes).  Arriving late affects you and other patients whose appointments are after yours.  Also, if you miss three or more appointments without notifying the office, you may be dismissed from the clinic at the provider's discretion.      For prescription refill requests, have your pharmacy contact our office and allow 72 hours for refills to be completed.    Today you received the following chemotherapy and/or immunotherapy agents: Keytruda/Alimta      To help prevent nausea and vomiting after your treatment, we encourage you to take your nausea medication as directed.  BELOW ARE SYMPTOMS THAT SHOULD BE REPORTED IMMEDIATELY: *FEVER GREATER THAN 100.4 F (38 C) OR HIGHER *CHILLS OR SWEATING *NAUSEA AND VOMITING THAT IS NOT CONTROLLED WITH YOUR NAUSEA MEDICATION *UNUSUAL SHORTNESS OF BREATH *UNUSUAL BRUISING OR BLEEDING *URINARY PROBLEMS (pain or burning when urinating, or frequent urination) *BOWEL PROBLEMS (unusual diarrhea, constipation, pain near the anus) TENDERNESS IN MOUTH AND THROAT WITH OR WITHOUT PRESENCE OF ULCERS (sore throat, sores in mouth, or a toothache) UNUSUAL RASH, SWELLING OR PAIN  UNUSUAL VAGINAL DISCHARGE OR ITCHING   Items with * indicate a potential emergency and should be followed up as soon as possible or go to the Emergency Department if any problems should occur.  Please show the CHEMOTHERAPY ALERT CARD or IMMUNOTHERAPY ALERT CARD at  check-in to the Emergency Department and triage nurse.  Should you have questions after your visit or need to cancel or reschedule your appointment, please contact [facility]  Dept: [phone]  and follow the prompts.  Office hours are 8:00 a.m. to 4:30 p.m. Monday - Friday. Please note that voicemails left after 4:00 p.m. may not be returned until the following business day.  We are closed weekends and major holidays. You have access to a nurse at all times for urgent questions. Please call the main number to the clinic Dept: [phone] and follow the prompts.   For any non-urgent questions, you may also contact your provider using MyChart. We now offer e-Visits for anyone 58 and older to request care online for non-urgent symptoms. For details visit [email or web address].   Also download the MyChart app! Go to the app store, search "MyChart", open the app, select [facility], and log in with your MyChart username and password.  Masks are optional in the cancer centers. If you would like for your care team to wear a mask while they are taking care of you, please let them know. You may have one support Sacheen Arrasmith who is at least 81 years old accompany you for your appointments.

## 2022-01-21 LAB — T4: T4, Total: 8.5 ug/dL (ref 4.5–12.0)

## 2022-01-26 ENCOUNTER — Encounter: Payer: Self-pay | Admitting: Internal Medicine

## 2022-01-27 ENCOUNTER — Telehealth: Payer: Self-pay

## 2022-01-27 ENCOUNTER — Other Ambulatory Visit: Payer: Self-pay

## 2022-01-27 ENCOUNTER — Encounter: Payer: Self-pay | Admitting: Internal Medicine

## 2022-01-27 NOTE — Telephone Encounter (Signed)
Called patient's wife to relay the following message from Dr. Mickeal Skinner.  " He can resume decadron 2mg  daily.  If not improved then we will move up next scan".   Mrs. Propes acknowledged message.

## 2022-01-28 ENCOUNTER — Other Ambulatory Visit: Payer: Self-pay

## 2022-01-31 ENCOUNTER — Other Ambulatory Visit: Payer: Self-pay

## 2022-02-09 ENCOUNTER — Other Ambulatory Visit: Payer: Self-pay

## 2022-02-09 ENCOUNTER — Encounter: Payer: Self-pay | Admitting: Internal Medicine

## 2022-02-10 ENCOUNTER — Encounter: Payer: Self-pay | Admitting: Internal Medicine

## 2022-02-10 ENCOUNTER — Inpatient Hospital Stay: Payer: Medicare HMO | Attending: Internal Medicine

## 2022-02-10 ENCOUNTER — Inpatient Hospital Stay (HOSPITAL_COMMUNITY): Payer: Medicare HMO

## 2022-02-10 ENCOUNTER — Telehealth: Payer: Self-pay

## 2022-02-10 ENCOUNTER — Emergency Department (HOSPITAL_COMMUNITY): Payer: Medicare HMO

## 2022-02-10 ENCOUNTER — Inpatient Hospital Stay (HOSPITAL_COMMUNITY)
Admission: EM | Admit: 2022-02-10 | Discharge: 2022-02-14 | DRG: 644 | Disposition: A | Discharge status: Home Health | Payer: Medicare HMO | Source: Ambulatory Visit | Attending: Internal Medicine | Admitting: Internal Medicine

## 2022-02-10 ENCOUNTER — Inpatient Hospital Stay (HOSPITAL_BASED_OUTPATIENT_CLINIC_OR_DEPARTMENT_OTHER): Payer: Medicare HMO | Admitting: Internal Medicine

## 2022-02-10 ENCOUNTER — Inpatient Hospital Stay: Payer: Medicare HMO

## 2022-02-10 VITALS — BP 121/65 | HR 73 | Temp 97.6°F | Resp 16 | Wt 191.8 lb

## 2022-02-10 DIAGNOSIS — F32A Depression, unspecified: Secondary | ICD-10-CM | POA: Diagnosis present

## 2022-02-10 DIAGNOSIS — C3490 Malignant neoplasm of unspecified part of unspecified bronchus or lung: Secondary | ICD-10-CM | POA: Diagnosis not present

## 2022-02-10 DIAGNOSIS — Z79899 Other long term (current) drug therapy: Secondary | ICD-10-CM

## 2022-02-10 DIAGNOSIS — M47817 Spondylosis without myelopathy or radiculopathy, lumbosacral region: Secondary | ICD-10-CM | POA: Diagnosis present

## 2022-02-10 DIAGNOSIS — C3492 Malignant neoplasm of unspecified part of left bronchus or lung: Secondary | ICD-10-CM | POA: Diagnosis present

## 2022-02-10 DIAGNOSIS — K449 Diaphragmatic hernia without obstruction or gangrene: Secondary | ICD-10-CM | POA: Diagnosis present

## 2022-02-10 DIAGNOSIS — E860 Dehydration: Secondary | ICD-10-CM

## 2022-02-10 DIAGNOSIS — E039 Hypothyroidism, unspecified: Secondary | ICD-10-CM | POA: Diagnosis present

## 2022-02-10 DIAGNOSIS — E559 Vitamin D deficiency, unspecified: Secondary | ICD-10-CM | POA: Diagnosis present

## 2022-02-10 DIAGNOSIS — R590 Localized enlarged lymph nodes: Secondary | ICD-10-CM | POA: Diagnosis present

## 2022-02-10 DIAGNOSIS — D649 Anemia, unspecified: Secondary | ICD-10-CM | POA: Diagnosis present

## 2022-02-10 DIAGNOSIS — C7931 Secondary malignant neoplasm of brain: Secondary | ICD-10-CM | POA: Diagnosis present

## 2022-02-10 DIAGNOSIS — E782 Mixed hyperlipidemia: Secondary | ICD-10-CM | POA: Diagnosis present

## 2022-02-10 DIAGNOSIS — K2270 Barrett's esophagus without dysplasia: Secondary | ICD-10-CM | POA: Diagnosis present

## 2022-02-10 DIAGNOSIS — C782 Secondary malignant neoplasm of pleura: Secondary | ICD-10-CM | POA: Insufficient documentation

## 2022-02-10 DIAGNOSIS — E871 Hypo-osmolality and hyponatremia: Secondary | ICD-10-CM | POA: Diagnosis present

## 2022-02-10 DIAGNOSIS — K5900 Constipation, unspecified: Secondary | ICD-10-CM | POA: Diagnosis present

## 2022-02-10 DIAGNOSIS — C3412 Malignant neoplasm of upper lobe, left bronchus or lung: Secondary | ICD-10-CM | POA: Insufficient documentation

## 2022-02-10 DIAGNOSIS — Z95828 Presence of other vascular implants and grafts: Secondary | ICD-10-CM

## 2022-02-10 DIAGNOSIS — Z7982 Long term (current) use of aspirin: Secondary | ICD-10-CM | POA: Diagnosis not present

## 2022-02-10 DIAGNOSIS — E222 Syndrome of inappropriate secretion of antidiuretic hormone: Secondary | ICD-10-CM | POA: Diagnosis present

## 2022-02-10 DIAGNOSIS — G40A09 Absence epileptic syndrome, not intractable, without status epilepticus: Secondary | ICD-10-CM | POA: Diagnosis present

## 2022-02-10 DIAGNOSIS — Z79890 Hormone replacement therapy (postmenopausal): Secondary | ICD-10-CM | POA: Diagnosis not present

## 2022-02-10 DIAGNOSIS — R5383 Other fatigue: Secondary | ICD-10-CM | POA: Insufficient documentation

## 2022-02-10 DIAGNOSIS — R569 Unspecified convulsions: Secondary | ICD-10-CM | POA: Insufficient documentation

## 2022-02-10 DIAGNOSIS — Z9221 Personal history of antineoplastic chemotherapy: Secondary | ICD-10-CM | POA: Diagnosis not present

## 2022-02-10 DIAGNOSIS — J910 Malignant pleural effusion: Secondary | ICD-10-CM | POA: Insufficient documentation

## 2022-02-10 LAB — CMP (CANCER CENTER ONLY)
ALT: 8 U/L (ref 0–44)
AST: 15 U/L (ref 15–41)
Albumin: 3.5 g/dL (ref 3.5–5.0)
Alkaline Phosphatase: 77 U/L (ref 38–126)
Anion gap: 6 (ref 5–15)
BUN: 11 mg/dL (ref 8–23)
CO2: 26 mmol/L (ref 22–32)
Calcium: 8.9 mg/dL (ref 8.9–10.3)
Chloride: 87 mmol/L — ABNORMAL LOW (ref 98–111)
Creatinine: 0.81 mg/dL (ref 0.61–1.24)
GFR, Estimated: >60 mL/min (ref >60)
Glucose, Bld: 126 mg/dL — ABNORMAL HIGH (ref 70–99)
Potassium: 3.9 mmol/L (ref 3.5–5.1)
Sodium: 118 mmol/L — CL (ref 135–145)
Total Bilirubin: 0.4 mg/dL (ref 0.3–1.2)
Total Protein: 5.9 g/dL — ABNORMAL LOW (ref 6.5–8.1)

## 2022-02-10 LAB — BASIC METABOLIC PANEL
Anion gap: 10 (ref 5–15)
BUN: 12 mg/dL (ref 8–23)
CO2: 23 mmol/L (ref 22–32)
Calcium: 8.7 mg/dL — ABNORMAL LOW (ref 8.9–10.3)
Chloride: 85 mmol/L — ABNORMAL LOW (ref 98–111)
Creatinine, Ser: 0.86 mg/dL (ref 0.61–1.24)
GFR, Estimated: >60 mL/min (ref >60)
Glucose, Bld: 123 mg/dL — ABNORMAL HIGH (ref 70–99)
Potassium: 4.4 mmol/L (ref 3.5–5.1)
Sodium: 118 mmol/L — CL (ref 135–145)

## 2022-02-10 LAB — CBC WITH DIFFERENTIAL (CANCER CENTER ONLY)
Abs Immature Granulocytes: 0.07 10*3/uL (ref 0.00–0.07)
Basophils Absolute: 0 10*3/uL (ref 0.0–0.1)
Basophils Relative: 0 %
Eosinophils Absolute: 0.1 10*3/uL (ref 0.0–0.5)
Eosinophils Relative: 2 %
HCT: 27.3 % — ABNORMAL LOW (ref 39.0–52.0)
Hemoglobin: 9.6 g/dL — ABNORMAL LOW (ref 13.0–17.0)
Immature Granulocytes: 1 %
Lymphocytes Relative: 18 %
Lymphs Abs: 1.2 10*3/uL (ref 0.7–4.0)
MCH: 31.4 pg (ref 26.0–34.0)
MCHC: 35.2 g/dL (ref 30.0–36.0)
MCV: 89.2 fL (ref 80.0–100.0)
Monocytes Absolute: 0.8 10*3/uL (ref 0.1–1.0)
Monocytes Relative: 11 %
Neutro Abs: 4.7 10*3/uL (ref 1.7–7.7)
Neutrophils Relative %: 68 %
Platelet Count: 256 10*3/uL (ref 150–400)
RBC: 3.06 MIL/uL — ABNORMAL LOW (ref 4.22–5.81)
RDW: 16.4 % — ABNORMAL HIGH (ref 11.5–15.5)
WBC Count: 6.9 10*3/uL (ref 4.0–10.5)
nRBC: 0 % (ref 0.0–0.2)

## 2022-02-10 LAB — CBG MONITORING, ED: Glucose-Capillary: 118 mg/dL — ABNORMAL HIGH (ref 70–99)

## 2022-02-10 LAB — MRSA NEXT GEN BY PCR, NASAL: MRSA by PCR Next Gen: NOT DETECTED

## 2022-02-10 MED ORDER — LEVOTHYROXINE SODIUM 25 MCG PO TABS
25.0000 ug | ORAL_TABLET | Freq: Every day | ORAL | Status: DC
  Administered 2022-02-11 – 2022-02-14 (×4): 25 ug via ORAL
  Filled 2022-02-10 (×4): qty 1

## 2022-02-10 MED ORDER — ONDANSETRON HCL 4 MG PO TABS: 4.0000 mg | ORAL_TABLET | Freq: Four times a day (QID) | ORAL | Status: DC | PRN

## 2022-02-10 MED ORDER — ONDANSETRON HCL 4 MG/2ML IJ SOLN: 4.0000 mg | Freq: Four times a day (QID) | INTRAMUSCULAR | Status: DC | PRN

## 2022-02-10 MED ORDER — TAMSULOSIN HCL 0.4 MG PO CAPS: 0.4000 mg | ORAL_CAPSULE | Freq: Every day | ORAL | Status: DC

## 2022-02-10 MED ORDER — ACETAMINOPHEN 650 MG RE SUPP: 650.0000 mg | Freq: Four times a day (QID) | RECTAL | Status: DC | PRN

## 2022-02-10 MED ORDER — SODIUM CHLORIDE 3 % IV SOLN
INTRAVENOUS | Status: DC
  Filled 2022-02-10 (×3): qty 500

## 2022-02-10 MED ORDER — CHLORHEXIDINE GLUCONATE CLOTH 2 % EX PADS
6.0000 | MEDICATED_PAD | Freq: Every day | CUTANEOUS | Status: DC
  Administered 2022-02-10 – 2022-02-14 (×5): 6 via TOPICAL

## 2022-02-10 MED ORDER — LEVETIRACETAM IN NACL 1000 MG/100ML IV SOLN
1000.0000 mg | Freq: Once | INTRAVENOUS | Status: AC
  Administered 2022-02-10: 1000 mg via INTRAVENOUS
  Filled 2022-02-10: qty 100

## 2022-02-10 MED ORDER — DEXAMETHASONE 4 MG PO TABS
2.0000 mg | ORAL_TABLET | Freq: Every day | ORAL | Status: DC
  Administered 2022-02-11 – 2022-02-14 (×4): 2 mg via ORAL
  Filled 2022-02-10 (×4): qty 1

## 2022-02-10 MED ORDER — PANTOPRAZOLE SODIUM 40 MG PO TBEC
40.0000 mg | DELAYED_RELEASE_TABLET | Freq: Every day | ORAL | Status: DC
  Administered 2022-02-10 – 2022-02-14 (×5): 40 mg via ORAL
  Filled 2022-02-10 (×6): qty 1

## 2022-02-10 MED ORDER — LEVETIRACETAM 500 MG PO TABS
500.0000 mg | ORAL_TABLET | Freq: Two times a day (BID) | ORAL | Status: DC
  Administered 2022-02-10 – 2022-02-14 (×8): 500 mg via ORAL
  Filled 2022-02-10 (×8): qty 1

## 2022-02-10 MED ORDER — SODIUM CHLORIDE 0.9 % IV BOLUS
500.0000 mL | Freq: Once | INTRAVENOUS | Status: AC
  Administered 2022-02-10: 500 mL via INTRAVENOUS

## 2022-02-10 MED ORDER — GADOBUTROL 1 MMOL/ML IV SOLN
9.0000 mL | Freq: Once | INTRAVENOUS | Status: AC | PRN
  Administered 2022-02-10: 9 mL via INTRAVENOUS

## 2022-02-10 MED ORDER — SODIUM CHLORIDE 0.9% FLUSH
10.0000 mL | Freq: Once | INTRAVENOUS | Status: AC
  Administered 2022-02-10: 10 mL

## 2022-02-10 MED ORDER — ACETAMINOPHEN 325 MG PO TABS: 650.0000 mg | ORAL_TABLET | Freq: Four times a day (QID) | ORAL | Status: DC | PRN

## 2022-02-10 MED ORDER — SODIUM CHLORIDE 0.9 % IV SOLN: INTRAVENOUS | Status: DC

## 2022-02-10 NOTE — H&P (Signed)
History and Physical    Patient: Peter Brown IDP:[id number] DOB: April 23, 1940 DOA: 02/10/2022 DOS: the patient was seen and examined on 02/10/2022 PCP: [name], MD  Patient coming from: Home  Chief Complaint:  Chief Complaint  Patient presents with   Seizures   HPI: Peter Brown is a 82 y.o. 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  He is still confused history is mostly provided by his wife.  He is able to answer simple questions.  According to his wife, he has been having frontal coronal headaches and the patient has some mild headache at the moment.  He has occasional lower back pain.  At the moment, no chest, back or abdominal pain.  Lab work: CBC showed white count 6.9, hemoglobin 9.6 g/dL platelets 256.  CMP with a sodium 118 and chloride 87 mmol/L.  Glucose was 126 mg/dL and total protein 5.9 g/dL.  The rest of the CMP measurements were normal.  Imaging: CT head without contrast showing treated metastatic lesion in left anterior temporal lobe.  There appears to be less edema and mass effect. MRI of the brain with no evidence of new or progressive metastatic disease.  There is decreased size in the left temporal lesion.  No residual finding related to the treated right occipital and vermian metastases.  Please see images and full radiology report for further details.  ED course: Initial vital signs were temperature 97.9 F, pulse 66, respiration 22, BP 139/78 mmHg O2 sat 100% on room air.  Patient received a 500 mL NS bolus and 1000 mg of Keppra IVPB.   Review of Systems: As mentioned in the history of present illness. All other  systems reviewed and are negative.  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 ICD10 Conversion   Past Surgical History:  Procedure Laterality Date   IR IMAGING GUIDED PORT INSERTION  11/24/2020   KNEE SURGERY     orthoscopic     TONSILLECTOMY AND ADENOIDECTOMY     Social History:  reports that he has never smoked. He has never used smokeless tobacco. He reports that he does not drink alcohol and does not use drugs.  No Known Allergies  No family history on file.  Prior to Admission medications   Medication Sig Start Date End Date Taking? Authorizing Provider  albuterol (VENTOLIN HFA) 108 (90 Base) MCG/ACT inhaler Inhale 2 puffs into the lungs every 4 (four) hours as needed for wheezing or shortness of breath. 06/25/21   [provider]  aspirin 81 MG EC tablet Take 81 mg by mouth daily.    [provider]  Bacillus Coagulans-Inulin (PROBIOTIC) 1-250 BILLION-MG CAPS Take 1 tablet by mouth daily.    [provider]  bisacodyl (DULCOLAX) 5 MG EC tablet Take 5 mg by mouth 3 (three) times daily as needed for moderate constipation.    [provider]  Cholecalciferol (D3 PO) Take 1 tablet by mouth daily.    [provider]  coal tar-salicylic acid 2 % shampoo Apply topically daily as needed for itching.    [provider]  DULoxetine (CYMBALTA) 30 MG capsule Take 30 mg by mouth daily. 06/19/19   [provider]  fexofenadine (ALLEGRA) 180 MG tablet Take 180 mg by mouth daily as needed for allergies.    [provider]  fluocinonide cream (LIDEX) 6.62 % Apply 1 application   topically 2 (two) times daily as needed (psroasis).    [provider]  folic acid (FOLVITE) 1 MG tablet TAKE 1 TABLET(1 MG) BY MOUTH DAILY 12/29/20   Curt Bears, MD  hydrocortisone cream 1 % Apply 1 application topically 2 (two) times daily. Patient taking differently: Apply 1 application  topically 2 (two) times daily as needed (rash). 09/04/20   Heilingoetter, Cassandra L, PA-C  ketoconazole (NIZORAL) 2 % cream Apply 1 application  topically daily as needed (rash).    [provider]  Astrid Drafts (OMEGA-3) 500 MG CAPS Take 1 capsule by mouth daily.    [provider]  levothyroxine (SYNTHROID) 25 MCG tablet TAKE 1 TABLET(25 MCG) BY MOUTH DAILY BEFORE BREAKFAST 01/11/22   Heilingoetter, Cassandra L, PA-C  lidocaine-prilocaine (EMLA) cream Apply 1 application topically as needed. Patient taking differently: Apply 1 application  topically daily as needed (access port). 12/12/20   Curt Bears, MD  Magnesium 500 MG TABS Take 1 tablet by mouth every evening.    [provider]  Melatonin 12 MG TABS Take by mouth.    [provider]  Niacin (VITAMIN B-3 PO) Take 1 tablet by mouth daily.    [provider]  omeprazole (PRILOSEC) 40 MG capsule Take 40 mg by mouth every evening.    [provider]  oxyCODONE-acetaminophen (PERCOCET/ROXICET) 5-325 MG tablet Take 1 tablet by mouth every 8 (eight) hours as needed for severe pain. 01/20/22   Curt Bears, MD  tamsulosin (FLOMAX) 0.4 MG CAPS capsule Take 0.4 mg by mouth at bedtime. 10/01/21   [provider]    Physical Exam: Vitals:   02/10/22 1143 02/10/22 1144  BP: 139/78   Pulse: 66   Resp: (!) 22   Temp: 97.9 F (36.6 C) 97.9 F (36.6 C)  TempSrc: Oral Oral  SpO2: 100%    Physical Exam Vitals and nursing note reviewed.  Constitutional:      General: He is awake. He is not in acute distress.    Appearance: Normal appearance.  HENT:     Head: Normocephalic.      Nose: No rhinorrhea.     Mouth/Throat:     Mouth: Mucous membranes are moist.  Eyes:     General: No scleral icterus.    Pupils: Pupils are equal, round, and reactive to light.  Neck:     Vascular: No JVD.  Cardiovascular:     Rate and Rhythm: Normal rate and regular rhythm.     Heart sounds: S1 normal and S2 normal.  Pulmonary:     Effort: Pulmonary effort is normal.     Breath sounds: Normal breath sounds.  Abdominal:     General: Bowel sounds are normal. There is no distension.     Palpations: Abdomen is soft.     Tenderness: There is no abdominal tenderness. There is no guarding.  Musculoskeletal:     Cervical back: Neck supple.     Right lower leg: No edema.  Left lower leg: No edema.  Skin:    General: Skin is warm and dry.  Neurological:     General: No focal deficit present.     Mental Status: He is alert. He is disoriented.  Psychiatric:        Mood and Affect: Mood normal.        Behavior: Behavior normal. Behavior is cooperative.     Data Reviewed:  Results are pending, will review when available.  Assessment and Plan: Principal Problem:   Absence seizure ([place])  Secondary to:   Hyponatremia In the setting of:   Adenocarcinoma, lung, left (HCC)   Malignant neoplasm metastatic to brain [facility]) Stepdown/inpatient. Frequent neurochecks. Continue normal saline infusion. Fluid restriction. Monitor sodium level. Check EEG. Neuro-oncology recommended treatment with Keppra.  Active Problems:   Barrett's esophagus without dysplasia   Hiatal hernia Pantoprazole 40 mg p.o. daily.    Mixed hyperlipidemia On OTC niacin? Follow-up with PCP.    Normocytic anemia Monitor hematocrit and hemoglobin.    Acquired hypothyroidism Continue levothyroxine 25 mcg p.o. daily.     Advance Care Planning:   Code Status: Full Code   Consults:   Family Communication: His wife was at bedside.  Severity of Illness: The appropriate patient status for this patient  is INPATIENT. Inpatient status is judged to be reasonable and necessary in order to provide the required intensity of service to ensure the patient's safety. The patient's presenting symptoms, physical exam findings, and initial radiographic and laboratory data in the context of their chronic comorbidities is felt to place them at high risk for further clinical deterioration. Furthermore, it is not anticipated that the patient will be medically stable for discharge from the hospital within 2 midnights of admission.   * I certify that at the point of admission it is my clinical judgment that the patient will require inpatient hospital care spanning beyond 2 midnights from the point of admission due to high intensity of service, high risk for further deterioration and high frequency of surveillance required.*  Author: Reubin Milan, MD 02/10/2022 1:43 PM  For on call review [email or web address].   This document was prepared using Dragon voice recognition software and may contain some unintended transcription errors.

## 2022-02-10 NOTE — ED Triage Notes (Signed)
Pt arrives from cancer center for absence seizure lasting ~8 seconds. Na+ came back at 118. Hx lung cancer. Supposed to have immunotherapy today, last tx ~ 3 weeks ago.  121/65 HR 73 RR 18 O2 97% Temp 97.6

## 2022-02-10 NOTE — ED Notes (Signed)
ED TO INPATIENT HANDOFF REPORT  ED Nurse Name and Phone #: Clarise Cruz [phone]  S Name/Age/Gender Peter Brown 82 y.o. male Room/Bed: WA25/WA25  Code Status   Code Status: Full Code  Home/SNF/Other  Patient oriented to: self, place, time, and situation Is this baseline? Yes   Triage Complete: Triage complete  Chief Complaint Hyponatremia [E87.1]  Triage Note Pt arrives from cancer center for absence seizure lasting ~8 seconds. Na+ came back at 118. Hx lung cancer. Supposed to have immunotherapy today, last tx ~ 3 weeks ago.  121/65 HR 73 RR 18 O2 97% Temp 97.6   Allergies No Known Allergies  Level of Care/Admitting Diagnosis ED Disposition     ED Disposition  Admit   Condition  --   Comment  Hospital Area: [facility] [[id number]]  Level of Care: Stepdown [14]  Admit to SDU based on following criteria: Severe physiological/psychological symptoms:  Any diagnosis requiring assessment & intervention at least every 4 hours on an ongoing basis to obtain desired patient outcomes including stability and rehabilitation  May admit patient to [facility] or [facility] if equivalent level of care is available:: No  Covid Evaluation: Asymptomatic - no recent exposure (last 10 days) testing not required  Diagnosis: Hyponatremia [198519]  Admitting Physician: Reubin Milan [[id number]]  Attending Physician: Reubin Milan [[id number]]  Certification:: I certify this patient will need inpatient services for at least 2 midnights  Estimated Length of Stay: 2          B Medical/Surgery History Past Medical History:  Diagnosis Date   Barrett's esophagus without dysplasia 11/28/2013   Lung cancer ([place])    Lung cancer metastatic to brain ([place])    Mixed hyperlipidemia 10/14/2020   Other psoriasis 10/14/2020   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 ICD10 Conversion   Past Surgical History:  Procedure Laterality Date   IR IMAGING GUIDED PORT INSERTION  11/24/2020   KNEE SURGERY     orthoscopic     TONSILLECTOMY AND ADENOIDECTOMY       A IV Location/Drains/Wounds Patient Lines/Drains/Airways Status     Active Line/Drains/Airways     Name Placement date Placement time Site Days   Implanted Port 11/24/20 Right Chest 11/24/20  1536  Chest  443   External Urinary Catheter 02/10/22  1543  --  less than 1            Intake/Output Last 24 hours No intake or output data in the 24 hours ending 02/10/22 1743  Labs/Imaging Results for orders placed or performed during the hospital encounter of 02/10/22 (from the past 48 hour(s))  CBG monitoring, ED     Status: Abnormal   Collection Time: 02/10/22 11:42 AM  Result Value Ref Range   Glucose-Capillary 118 (H) 70 - 99 mg/dL    Comment: Glucose reference range applies only to samples taken after fasting for at least 8 hours.   MR Brain W and Wo Contrast  Result Date: 02/10/2022 CLINICAL DATA:  Metastatic disease.  Assess treatment response. EXAM: MRI HEAD WITHOUT AND WITH CONTRAST TECHNIQUE: Multiplanar, multiecho pulse sequences of the brain and surrounding structures were obtained without and with intravenous contrast. CONTRAST:  72mL GADAVIST GADOBUTROL 1 MMOL/ML IV SOLN COMPARISON:  CT same day.  MRI 12/03/2021. 07/29/2021. FINDINGS: Brain: Diffusion imaging does not show any acute or subacute infarction or other cause  of restricted diffusion. Posterior fossa appears normal. No residual finding related to the treated vermian metastatic lesion. Right cerebral hemisphere appears normal. No residual finding relating to the previously treated medial right occipital metastasis. Treated left temporal lesion continues to contract, axial diameter 17 x 18 mm compared with 22 x 25 mm in November. No new or progressive finding. Reduction in T2 FLAIR signal within the  left temporal lobe. No new lesion seen in the left hemisphere. No hydrocephalus.  No extra-axial collection. Vascular: Major vessels at the base of the brain show flow. Skull and upper cervical spine: Negative Sinuses/Orbits: Clear/normal Other: None IMPRESSION: 1. No evidence of new or progressive metastatic disease. 2. Treated left temporal lesion continues to contract, axial diameter 17 x 18 mm compared with 22 x 25 mm in November. Reduction in T2 FLAIR signal within the left temporal lobe. 3. No residual finding related to the treated right occipital metastasis. 4. No residual finding related to the treated vermian metastasis. Electronically Signed   By: Nelson Chimes M.D.   On: 02/10/2022 15:11   CT Head Wo Contrast  Result Date: 02/10/2022 CLINICAL DATA:  Sinusitis. Acute. Orbital or intracranial complication suspected. Seizure. History of lung cancer. EXAM: CT HEAD WITHOUT CONTRAST TECHNIQUE: Contiguous axial images were obtained from the base of the skull through the vertex without intravenous contrast. RADIATION DOSE REDUCTION: This exam was performed according to the departmental dose-optimization program which includes automated exposure control, adjustment of the mA and/or kV according to patient size and/or use of iterative reconstruction technique. COMPARISON:  05/01/2021.  12/04/2021 MRI. FINDINGS: Brain: No CT abnormality visible in the brainstem or cerebellum. Indistinct low-density in the left anterior temporal lobe relates to a treated lesion in that region. There appears to be less mass effect and edema than was seen on recent prior imaging, but this could certainly be a seizure focus. Cerebral hemispheres otherwise show atrophy and chronic small-vessel ischemic change. Other small treated lesions are not visible. No hydrocephalus or extra-axial collection. MRI of the brain with contrast would be appropriate if detailed evaluation of the metastatic disease is desired. Vascular: There is  atherosclerotic calcification of the major vessels at the base of the brain. Skull: Negative Sinuses/Orbits: Sinuses are clear.  Orbits are normal. Other: Mastoids are clear. IMPRESSION: Indistinct low-density in the left anterior temporal lobe relates to a treated metastatic lesion in that region. There appears to be less mass effect and edema than was seen on recent prior imaging, but this could certainly be a seizure focus. None of the other previously seen treated lesions are discernible by CT. MRI of the brain with contrast would be appropriate if detailed evaluation of the metastatic disease is desired. Electronically Signed   By: Nelson Chimes M.D.   On: 02/10/2022 13:08    Pending Labs Unresulted Labs (From admission, onward)     Start     Ordered   02/11/22 0500  CBC  Tomorrow morning,   R        02/10/22 1347   02/11/22 0500  Comprehensive metabolic panel  Tomorrow morning,   R        02/10/22 1347            Vitals/Pain Today's Vitals   02/10/22 1143 02/10/22 1144 02/10/22 1500 02/10/22 1630  BP: 139/78   134/83  Pulse: 66   68  Resp: (!) 22   16  Temp: 97.9 F (36.6 C) 97.9 F (36.6 C) 97.9 F (36.6 C)  TempSrc: Oral Oral    SpO2: 100%   98%  PainSc:  0-No pain      Isolation Precautions No active isolations  Medications Medications  acetaminophen (TYLENOL) tablet 650 mg (has no administration in time range)    Or  acetaminophen (TYLENOL) suppository 650 mg (has no administration in time range)  ondansetron (ZOFRAN) tablet 4 mg (has no administration in time range)    Or  ondansetron (ZOFRAN) injection 4 mg (has no administration in time range)  0.9 %  sodium chloride infusion ( Intravenous New Bag/Given 02/10/22 1440)  sodium chloride 0.9 % bolus 500 mL (0 mLs Intravenous Stopped 02/10/22 1210)  levETIRAcetam (KEPPRA) IVPB 1000 mg/100 mL premix (0 mg Intravenous Stopped 02/10/22 1358)  gadobutrol (GADAVIST) 1 MMOL/ML injection 9 mL (9 mLs Intravenous Contrast  Given 02/10/22 1451)    Mobility walks with device     Focused Assessments    R Recommendations: See Admitting Provider Note  Report given to:   Additional Notes:

## 2022-02-10 NOTE — ED Provider Notes (Signed)
[place] AT [facility] Provider Note   CSN: [id number] Arrival date & time: 02/10/22  1138     History  Chief Complaint  Patient presents with   Seizures    Peter Brown is a 82 y.o. male.  82 yo M with a chief complaint of loss of consciousness.  This occurred while he was getting his blood pressure, oncologist concern for possible absence seizure with his history of mets to the brain.  Sent here for evaluation and admission.  Patient does not remember what happened.  He feels like he was quickly back to baseline.  Has been having headaches and worsening confusion over the past 2 or 3 weeks.  Has been on steroids for this.  Had some flank pain about a week ago that resolved.  Otherwise denies chest pain abdominal pain cough congestion or fever.        Home Medications Prior to Admission medications   Medication Sig Start Date End Date Taking? Authorizing Provider  albuterol (VENTOLIN HFA) 108 (90 Base) MCG/ACT inhaler Inhale 2 puffs into the lungs every 4 (four) hours as needed for wheezing or shortness of breath. 06/25/21   [provider]  aspirin 81 MG EC tablet Take 81 mg by mouth daily.    [provider]  Bacillus Coagulans-Inulin (PROBIOTIC) 1-250 BILLION-MG CAPS Take 1 tablet by mouth daily.    [provider]  bisacodyl (DULCOLAX) 5 MG EC tablet Take 5 mg by mouth 3 (three) times daily as needed for moderate constipation.    [provider]  Cholecalciferol (D3 PO) Take 1 tablet by mouth daily.    [provider]  coal tar-salicylic acid 2 % shampoo Apply topically daily as needed for itching.    [provider]  DULoxetine (CYMBALTA) 30 MG capsule Take 30 mg by mouth daily. 06/19/19   [provider]  fexofenadine (ALLEGRA) 180 MG tablet Take 180 mg by mouth daily as needed for allergies.    [provider]  fluocinonide cream (LIDEX) 4.65 % Apply 1 application   topically 2 (two) times daily as needed (psroasis).    [provider]  folic acid (FOLVITE) 1 MG tablet TAKE 1 TABLET(1 MG) BY MOUTH DAILY 12/29/20   Curt Bears, MD  hydrocortisone cream 1 % Apply 1 application topically 2 (two) times daily. Patient taking differently: Apply 1 application  topically 2 (two) times daily as needed (rash). 09/04/20   Heilingoetter, Cassandra L, PA-C  ketoconazole (NIZORAL) 2 % cream Apply 1 application  topically daily as needed (rash).    [provider]  Astrid Drafts (OMEGA-3) 500 MG CAPS Take 1 capsule by mouth daily.    [provider]  levothyroxine (SYNTHROID) 25 MCG tablet TAKE 1 TABLET(25 MCG) BY MOUTH DAILY BEFORE BREAKFAST 01/11/22   Heilingoetter, Cassandra L, PA-C  lidocaine-prilocaine (EMLA) cream Apply 1 application topically as needed. Patient taking differently: Apply 1 application  topically daily as needed (access port). 12/12/20   Curt Bears, MD  Magnesium 500 MG TABS Take 1 tablet by mouth every evening.    [provider]  Melatonin 12 MG TABS Take by mouth.    [provider]  Niacin (VITAMIN B-3 PO) Take 1 tablet by mouth daily.    [provider]  omeprazole (PRILOSEC) 40 MG capsule Take 40 mg by mouth every evening.    [provider]  oxyCODONE-acetaminophen (PERCOCET/ROXICET) 5-325 MG tablet Take 1 tablet by mouth every 8 (eight)  hours as needed for severe pain. 01/20/22   Curt Bears, MD  tamsulosin (FLOMAX) 0.4 MG CAPS capsule Take 0.4 mg by mouth at bedtime. 10/01/21   [provider]      Allergies    Patient has no known allergies.    Review of Systems   Review of Systems  Physical Exam Updated Vital Signs BP 139/78 (BP Location: Left Arm)   Pulse 66   Temp 97.9 F (36.6 C) (Oral)   Resp (!) 22   SpO2 100%  Physical Exam Vitals and nursing note reviewed.  Constitutional:      Appearance: He is well-developed.  HENT:     Head: Normocephalic  and atraumatic.  Eyes:     Pupils: Pupils are equal, round, and reactive to light.  Neck:     Vascular: No JVD.  Cardiovascular:     Rate and Rhythm: Normal rate and regular rhythm.     Heart sounds: No murmur heard.    No friction rub. No gallop.  Pulmonary:     Effort: No respiratory distress.     Breath sounds: No wheezing.  Abdominal:     General: There is no distension.     Tenderness: There is no abdominal tenderness. There is no guarding or rebound.  Musculoskeletal:        General: Normal range of motion.     Cervical back: Normal range of motion and neck supple.  Skin:    Coloration: Skin is not pale.     Findings: No rash.  Neurological:     Mental Status: He is alert.     Comments: Confused response  Psychiatric:        Behavior: Behavior normal.     ED Results / Procedures / Treatments   Labs (all labs ordered are listed, but only abnormal results are displayed) Labs Reviewed  CBG MONITORING, ED - Abnormal; Notable for the following components:      Result Value   Glucose-Capillary 118 (*)    All other components within normal limits    EKG None  Radiology CT Head Wo Contrast  Result Date: 02/10/2022 CLINICAL DATA:  Sinusitis. Acute. Orbital or intracranial complication suspected. Seizure. History of lung cancer. EXAM: CT HEAD WITHOUT CONTRAST TECHNIQUE: Contiguous axial images were obtained from the base of the skull through the vertex without intravenous contrast. RADIATION DOSE REDUCTION: This exam was performed according to the departmental dose-optimization program which includes automated exposure control, adjustment of the mA and/or kV according to patient size and/or use of iterative reconstruction technique. COMPARISON:  05/01/2021.  12/04/2021 MRI. FINDINGS: Brain: No CT abnormality visible in the brainstem or cerebellum. Indistinct low-density in the left anterior temporal lobe relates to a treated lesion in that region. There appears to be less mass  effect and edema than was seen on recent prior imaging, but this could certainly be a seizure focus. Cerebral hemispheres otherwise show atrophy and chronic small-vessel ischemic change. Other small treated lesions are not visible. No hydrocephalus or extra-axial collection. MRI of the brain with contrast would be appropriate if detailed evaluation of the metastatic disease is desired. Vascular: There is atherosclerotic calcification of the major vessels at the base of the brain. Skull: Negative Sinuses/Orbits: Sinuses are clear.  Orbits are normal. Other: Mastoids are clear. IMPRESSION: Indistinct low-density in the left anterior temporal lobe relates to a treated metastatic lesion in that region. There appears to be less mass effect and edema than was seen on recent prior imaging,  but this could certainly be a seizure focus. None of the other previously seen treated lesions are discernible by CT. MRI of the brain with contrast would be appropriate if detailed evaluation of the metastatic disease is desired. Electronically Signed   By: Nelson Chimes M.D.   On: 02/10/2022 13:08    Procedures .Critical Care  Performed by: Deno Etienne, DO Authorized by: Deno Etienne, DO   Critical care provider statement:    Critical care time (minutes):  35   Critical care time was exclusive of:  Separately billable procedures and treating other patients   Critical care was time spent personally by me on the following activities:  Development of treatment plan with patient or surrogate, discussions with consultants, evaluation of patient's response to treatment, examination of patient, ordering and review of laboratory studies, ordering and review of radiographic studies, ordering and performing treatments and interventions, pulse oximetry, re-evaluation of patient's condition and review of old charts   Care discussed with: admitting provider       Medications Ordered in ED Medications  levETIRAcetam (KEPPRA) IVPB 1000  mg/100 mL premix (has no administration in time range)  sodium chloride 0.9 % bolus 500 mL (0 mLs Intravenous Stopped 02/10/22 1210)    ED Course/ Medical Decision Making/ A&P                             Medical Decision Making Amount and/or Complexity of Data Reviewed Radiology: ordered.  Risk Prescription drug management.   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  Sent Here for Admission and Brain Imaging.  Start with a Head CT.  Blood Work Was Obtained at the [facility] This Morning, Does Have Hyponatremia of 118, No Significant Anemia.  No Neutropenia.  Small Bolus of IV Fluids. CT of the head with improved edema from baseline. I discussed the case with the neuro oncologist, Dr. Mickeal Skinner he did recommend an MRI of the brain, recommended starting the patient on Keppra.  With a sodium of 118 did recommend admission.  Will discuss with medicine.    The patients results and plan were reviewed and discussed.   Any x-rays performed were independently reviewed by myself.   Differential diagnosis were considered with the presenting HPI.  Medications  levETIRAcetam (KEPPRA) IVPB 1000 mg/100 mL premix (has no administration in time range)  sodium chloride 0.9 % bolus 500 mL (0 mLs Intravenous Stopped 02/10/22 1210)    Vitals:   02/10/22 1143 02/10/22 1144  BP: 139/78   Pulse: 66   Resp: (!) 22   Temp: 97.9 F (36.6 C) 97.9 F (36.6 C)  TempSrc: Oral Oral  SpO2: 100%     Final diagnoses:  Seizure ([place])    Admission/ observation were discussed with the admitting physician, patient and/or family and they are comfortable with the plan.          Final Clinical Impression(s) / ED Diagnoses Final diagnoses:  Seizure [facility])    Rx / Ray Orders ED Discharge Orders     None         Deno Etienne, DO 02/10/22 1340

## 2022-02-10 NOTE — Progress Notes (Signed)
[place] [phone]   Fax:([phone]  OFFICE PROGRESS NOTE  [name], MD [address] [place] [address]  DIAGNOSIS: Stage IV (T1b, N2, M1a) non-small cell lung cancer, adenocarcinoma diagnosed in July 2022 and presented with left upper lobe nodule in addition to AP window lymphadenopathy and left-sided malignant pleural effusion as well as pleural metastatic disease.  The patient also has solitary left temporal brain metastasis.  Biomarker Findings Microsatellite status - MS-Stable Tumor Mutational Burden - 4 Muts/Mb Genomic Findings For a complete list of the genes assayed, please refer to the Appendix. XFG1WE X937J PTEN splice site [id number] - subclonal? DOT1L S911L - subclonal? RAD21 S271* RB1 loss exons 3-23 TP53 N360fs*34 8 Disease relevant genes with no reportable alterations: ALK, BRAF, EGFR, ERBB2, KRAS, MET, RET, ROS1  PRIOR THERAPY: palliative radiotherapy to the enlarging left upper lobe lung mass at the [facility] by radiation oncology.  CURRENT THERAPY: Systemic chemotherapy with carboplatin for AUC of 5, Alimta 500 Mg/M2 and Keytruda 200 Mg IV every 3 weeks.  First dose August 13, 2020.  Status post 24 cycles.  Starting from cycle #5 he is on maintenance treatment with Alimta and Keytruda every 3 weeks.  INTERVAL HISTORY: Peter Brown 82 y.o. male returns to the clinic today for follow-up visit accompanied by his wife.  The patient had a syncopal episode earlier today when they were taking his vitals.  His sodium was later found to be a very low down to 118.  Previous serum sodium 3 weeks ago was 135.  The patient has been on treatment with a tapered dose of Decadron which may have been contributing to his condition but the possibility of progressive disease in the brain is also a concern.  He denied having any current nausea, vomiting, diarrhea or constipation.  He has no chest pain, shortness of breath, cough  or hemoptysis.  He has been feeling weak and tired over the last several weeks.  He also has insomnia and has been using melatonin the last few days.  He was here today for evaluation before starting cycle #25 of his treatment.   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 ICD10 Conversion    ALLERGIES:  has No Known Allergies.  MEDICATIONS:  Current Outpatient Medications  Medication Sig Dispense Refill   albuterol (VENTOLIN HFA) 108 (90 Base) MCG/ACT inhaler Inhale 2 puffs into the lungs every 4 (four) hours as needed for wheezing or shortness of breath.     aspirin 81 MG EC tablet Take 81 mg by mouth daily.     Bacillus Coagulans-Inulin (PROBIOTIC) 1-250 BILLION-MG CAPS Take 1 tablet by mouth daily.     bisacodyl (DULCOLAX) 5 MG EC tablet Take 5 mg by mouth 3 (three) times daily as needed for moderate constipation.     Cholecalciferol (D3 PO) Take 1 tablet by mouth daily.     coal tar-salicylic acid 2 % shampoo Apply topically daily as needed for itching.     DULoxetine (CYMBALTA) 30 MG capsule Take 30 mg by mouth daily.     fexofenadine (ALLEGRA) 180 MG tablet Take  180 mg by mouth daily as needed for allergies.     fluocinonide cream (LIDEX) 2.59 % Apply 1 application  topically 2 (two) times daily as needed (psroasis).     folic acid (FOLVITE) 1 MG tablet TAKE 1 TABLET(1 MG) BY MOUTH DAILY 30 tablet 4   hydrocortisone cream 1 % Apply 1 application topically 2 (two) times daily. (Patient taking differently: Apply 1 application  topically 2 (two) times daily as needed (rash).) 453.6 g 0   ketoconazole (NIZORAL) 2 % cream  Apply 1 application  topically daily as needed (rash).     Krill Oil (OMEGA-3) 500 MG CAPS Take 1 capsule by mouth daily.     levothyroxine (SYNTHROID) 25 MCG tablet TAKE 1 TABLET(25 MCG) BY MOUTH DAILY BEFORE BREAKFAST 30 tablet 2   lidocaine-prilocaine (EMLA) cream Apply 1 application topically as needed. (Patient taking differently: Apply 1 application  topically daily as needed (access port).) 30 g 1   Magnesium 500 MG TABS Take 1 tablet by mouth every evening.     Melatonin 12 MG TABS Take by mouth.     Niacin (VITAMIN B-3 PO) Take 1 tablet by mouth daily.     omeprazole (PRILOSEC) 40 MG capsule Take 40 mg by mouth every evening.     oxyCODONE-acetaminophen (PERCOCET/ROXICET) 5-325 MG tablet Take 1 tablet by mouth every 8 (eight) hours as needed for severe pain. 30 tablet 0   tamsulosin (FLOMAX) 0.4 MG CAPS capsule Take 0.4 mg by mouth at bedtime.     No current facility-administered medications for this visit.    SURGICAL HISTORY:  Past Surgical History:  Procedure Laterality Date   IR IMAGING GUIDED PORT INSERTION  11/24/2020   KNEE SURGERY     orthoscopic     TONSILLECTOMY AND ADENOIDECTOMY      REVIEW OF SYSTEMS:  Constitutional: positive for anorexia, fatigue, and weight loss Eyes: negative Ears, nose, mouth, throat, and face: negative Respiratory: negative Cardiovascular: negative Gastrointestinal: negative Genitourinary:negative Integument/breast: negative Hematologic/lymphatic: negative Musculoskeletal:positive for muscle weakness Neurological: positive for dizziness, seizures, and weakness Behavioral/Psych: negative Endocrine: negative Allergic/Immunologic: negative   PHYSICAL EXAMINATION: General appearance: alert, cooperative, fatigued, and no distress Head: Normocephalic, without obvious abnormality, atraumatic Neck: no adenopathy, no JVD, supple, symmetrical, trachea midline, and thyroid not enlarged, symmetric, no tenderness/mass/nodules Lymph nodes:  Cervical, supraclavicular, and axillary nodes normal. Resp: clear to auscultation bilaterally Back: symmetric, no curvature. ROM normal. No CVA tenderness. Cardio: regular rate and rhythm, S1, S2 normal, no murmur, click, rub or gallop GI: soft, non-tender; bowel sounds normal; no masses,  no organomegaly Extremities: extremities normal, atraumatic, no cyanosis or edema Neurologic: Alert and oriented X 3, normal strength and tone. Normal symmetric reflexes. Normal coordination and gait  ECOG PERFORMANCE STATUS: 1 - Symptomatic but completely ambulatory  Blood pressure 121/65, pulse 73, temperature 97.6 F (36.4 C), temperature source Oral, resp. rate 16, weight 191 lb 12.8 oz (87 kg), SpO2 97 %.  LABORATORY DATA: Lab Results  Component Value Date   WBC 6.9 02/10/2022   HGB 9.6 (L) 02/10/2022   HCT 27.3 (L) 02/10/2022   MCV 89.2 02/10/2022   PLT 256 02/10/2022      Chemistry      Component Value Date/Time   NA 135 01/20/2022 0906   K 4.1 01/20/2022 0906   CL 103 01/20/2022 0906   CO2 27 01/20/2022 0906   BUN 12 01/20/2022 0906   CREATININE 0.88 01/20/2022 0906      Component Value Date/Time  CALCIUM 9.3 01/20/2022 0906   ALKPHOS 75 01/20/2022 0906   AST 17 01/20/2022 0906   ALT 5 01/20/2022 0906   BILITOT 0.3 01/20/2022 0906       RADIOGRAPHIC STUDIES: No results found.  ASSESSMENT AND PLAN: This is a very pleasant 83 years old white male recently diagnosed with a stage IV (T1b, N2, M1a) non-small cell lung cancer, adenocarcinoma presented with left upper lobe lung nodule in addition to mediastinal lymphadenopathy and pleural-based metastasis as well as malignant left pleural effusion diagnosed in July 2022. His molecular studies by [facility] 1 showed no actionable mutations. The patient is currently undergoing systemic chemotherapy with carboplatin for AUC of 5, Alimta 500 Mg/M2 and Keytruda 200 Mg IV every 3 weeks.  He is status post 24 cycles.  Starting from cycle  #5 the patient will be on treatment with Alimta and Keytruda every 3 weeks.  The patient has been tolerating this treatment well with no concerning adverse effect except for fatigue. He was here today for evaluation before starting cycle #25 but the patient had a syncopal episode while the nurse tech was taking his vitals.  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 I recommended for the patient to go immediately to the emergency department for evaluation and admission for management of his hyponatremia which could be secondary to his disease or worsening brain metastasis. He will need repeat MRI of the brain during his hospitalization.  He is followed by Dr. Mickeal Skinner with neuro-oncology and if there is any concerning findings in the brain I would recommend consulting him. I will delay his systemic chemotherapy to resume in 3 weeks if he has improvement of his condition.  I will arrange for the patient to have repeat CT scan of the chest, abdomen and pelvis before his next treatment in 3 weeks unless it is done during his hospitalization. For the anterior left chest pain, this is likely from his previous radiotherapy.  I will start the patient on Percocet 5/325 mg p.o. every 8 hours as needed for pain. For the brain metastasis he is followed by radiation oncology as well as neurooncology.   The patient voices understanding of current disease status and treatment options and is in agreement with the current care plan.  All questions were answered. The patient knows to call the clinic with any problems, questions or concerns. We can certainly see the patient much sooner if necessary. The total time spent in the appointment was 30 minutes.  Disclaimer: This note was dictated with voice recognition software. Similar sounding words can inadvertently be transcribed and may not be corrected upon review.

## 2022-02-10 NOTE — Telephone Encounter (Signed)
CRITICAL VALUE STICKER  CRITICAL VALUE: SODIUM   RECEIVER (on-site recipient of call): Iyah Laguna P. LPN  DATE & TIME NOTIFIED: 02/10/22 11:19 AM  MESSENGER (representative from lab): JESSICA  MD NOTIFIED: DR. Julien Nordmann  TIME OF NOTIFICATION: 11:21AM  RESPONSE:  Patient taken to Emergency Department

## 2022-02-11 ENCOUNTER — Encounter: Payer: Self-pay | Admitting: *Deleted

## 2022-02-11 ENCOUNTER — Encounter (HOSPITAL_COMMUNITY): Payer: Self-pay | Admitting: Internal Medicine

## 2022-02-11 ENCOUNTER — Other Ambulatory Visit: Payer: Self-pay

## 2022-02-11 DIAGNOSIS — E871 Hypo-osmolality and hyponatremia: Secondary | ICD-10-CM | POA: Diagnosis not present

## 2022-02-11 LAB — COMPREHENSIVE METABOLIC PANEL
ALT: 10 U/L (ref 0–44)
AST: 16 U/L (ref 15–41)
Albumin: 2.9 g/dL — ABNORMAL LOW (ref 3.5–5.0)
Alkaline Phosphatase: 58 U/L (ref 38–126)
Anion gap: 9 (ref 5–15)
BUN: 11 mg/dL (ref 8–23)
CO2: 22 mmol/L (ref 22–32)
Calcium: 8.4 mg/dL — ABNORMAL LOW (ref 8.9–10.3)
Chloride: 91 mmol/L — ABNORMAL LOW (ref 98–111)
Creatinine, Ser: 0.86 mg/dL (ref 0.61–1.24)
GFR, Estimated: >60 mL/min (ref >60)
Glucose, Bld: 83 mg/dL (ref 70–99)
Potassium: 3.9 mmol/L (ref 3.5–5.1)
Sodium: 122 mmol/L — ABNORMAL LOW (ref 135–145)
Total Bilirubin: 0.7 mg/dL (ref 0.3–1.2)
Total Protein: 5.3 g/dL — ABNORMAL LOW (ref 6.5–8.1)

## 2022-02-11 LAB — CBC
HCT: 24.7 % — ABNORMAL LOW (ref 39.0–52.0)
Hemoglobin: 8.5 g/dL — ABNORMAL LOW (ref 13.0–17.0)
MCH: 31.4 pg (ref 26.0–34.0)
MCHC: 34.4 g/dL (ref 30.0–36.0)
MCV: 91.1 fL (ref 80.0–100.0)
Platelets: 183 10*3/uL (ref 150–400)
RBC: 2.71 MIL/uL — ABNORMAL LOW (ref 4.22–5.81)
RDW: 16.4 % — ABNORMAL HIGH (ref 11.5–15.5)
WBC: 5.4 10*3/uL (ref 4.0–10.5)
nRBC: 0 % (ref 0.0–0.2)

## 2022-02-11 LAB — BASIC METABOLIC PANEL
Anion gap: 7 (ref 5–15)
BUN: 11 mg/dL (ref 8–23)
CO2: 21 mmol/L — ABNORMAL LOW (ref 22–32)
Calcium: 8.3 mg/dL — ABNORMAL LOW (ref 8.9–10.3)
Chloride: 95 mmol/L — ABNORMAL LOW (ref 98–111)
Creatinine, Ser: 0.9 mg/dL (ref 0.61–1.24)
GFR, Estimated: >60 mL/min (ref >60)
Glucose, Bld: 115 mg/dL — ABNORMAL HIGH (ref 70–99)
Potassium: 3.5 mmol/L (ref 3.5–5.1)
Sodium: 123 mmol/L — ABNORMAL LOW (ref 135–145)

## 2022-02-11 LAB — SODIUM
Sodium: 120 mmol/L — ABNORMAL LOW (ref 135–145)
Sodium: 119 mmol/L — CL (ref 135–145)
Sodium: 127 mmol/L — ABNORMAL LOW (ref 135–145)
Sodium: 126 mmol/L — ABNORMAL LOW (ref 135–145)
Sodium: 125 mmol/L — ABNORMAL LOW (ref 135–145)
Sodium: 126 mmol/L — ABNORMAL LOW (ref 135–145)

## 2022-02-11 LAB — SODIUM, URINE, RANDOM: Sodium, Ur: 97 mmol/L

## 2022-02-11 LAB — OSMOLALITY, URINE: Osmolality, Ur: 456 mOsm/kg (ref 300–900)

## 2022-02-11 LAB — OSMOLALITY: Osmolality: 251 mOsm/kg — ABNORMAL LOW (ref 275–295)

## 2022-02-11 LAB — TSH: TSH: 1.657 u[IU]/mL (ref 0.350–4.500)

## 2022-02-11 MED ORDER — ORAL CARE MOUTH RINSE: 15.0000 mL | OROMUCOSAL | Status: DC | PRN

## 2022-02-11 MED ORDER — SODIUM CHLORIDE 3 % IV SOLN
INTRAVENOUS | Status: DC
  Filled 2022-02-11: qty 500

## 2022-02-11 MED ORDER — DULOXETINE HCL 30 MG PO CPEP
30.0000 mg | ORAL_CAPSULE | Freq: Every day | ORAL | Status: DC
  Administered 2022-02-11 – 2022-02-14 (×4): 30 mg via ORAL
  Filled 2022-02-11 (×4): qty 1

## 2022-02-11 NOTE — Progress Notes (Signed)
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  Patient was confused on admission.  CT head without contrast showed treated metastatic lesion in the left anterior temporal lobe.  There appears to be less edema and mass effect.  MRI of the brain with no evidence of new or progressive metastatic disease.   Assessment & Plan:   Principal Problem:   Hyponatremia Active Problems:   Adenocarcinoma, lung, left (HCC)   Malignant neoplasm metastatic to brain ([place])   Barrett's esophagus without dysplasia   Hiatal hernia   Mixed hyperlipidemia   Normocytic anemia   Acquired hypothyroidism   Absence seizure (HCC)  1-Severe hyponatremia: Presented with absence Seizure.  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  Urine osmolality pending.  Started on Hypertonic saline overnight.  Nephrology consulted.  Continue to monitor Sodium level every 4 hours.  Check TSH.   Absence seizure: In setting of Hyponatremia.  Discussed with Dr Sherrilee Gilles patient at risk for seizure from temporal lesion, ok to continue with Keppra.    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  Follow up outpatient.  Continue with Keppra for seizure prophylaxis.  On Decadron.   Esophagus without dysplasia Continue wit PPI>   Hyperlipidemia Management per PCP./   Normocytic anemia Monitor hb.   Hypothyroidism: Check TSH.  Continue with Synthroid.         Estimated body mass index is 26.01 kg/m as calculated from the following:    Height as of 08/24/21: 6' (1.829 m).   Weight as of an earlier encounter on 02/10/22: 87 kg.   DVT prophylaxis: SCD Code Status: Full code Family Communication: Wife at bedside.  Disposition Plan:  Status is: Inpatient Remains inpatient appropriate because: management to severe hyponatremia.     Consultants:  Nephrology   Procedures:    Antimicrobials:    Subjective: He is alert and conversant , oriented times 3. He doesn't remember event from yesterday.  Wife reported staring event at home day prior to admission, another episode at oncologist office.   Objective: Vitals:   02/11/22 0300 02/11/22 0400 02/11/22 0500 02/11/22 0600  BP: (!) 145/68 (!) 126/49 (!) 114/41 (!) 133/58  Pulse: 71 72 72 70  Resp: 17 17 16 17   Temp:      TempSrc:      SpO2: 96% 94% 93% 96%    Intake/Output Summary (Last 24 hours) at 02/11/2022 0747 Last data filed at 02/11/2022 0600 Gross per 24 hour  Intake 1002.07 ml  Output 400 ml  Net 602.07 ml   There were no vitals filed for this visit.  Examination:  General exam: Appears calm and comfortable  Respiratory system: Clear to auscultation. Respiratory effort normal. Cardiovascular system: S1 & S2 heard, RRR. No JVD, murmurs, rubs, gallops or clicks. No pedal edema. Gastrointestinal system: Abdomen is nondistended, soft and nontender. No organomegaly or masses felt. Normal bowel sounds heard. Central nervous system: Alert and oriented. Follows command Extremities: Symmetric 5 x 5 power.    Data Reviewed: I have personally reviewed following labs and imaging studies  CBC: Recent Labs  Lab 02/10/22 1029 02/11/22 0535  WBC 6.9 5.4  NEUTROABS 4.7  --   HGB 9.6* 8.5*  HCT 27.3* 24.7*  MCV 89.2 91.1  PLT 256 454   Basic Metabolic Panel: Recent Labs  Lab 02/10/22 1029 02/10/22 1922 02/11/22 0001 02/11/22 0230 02/11/22 0535  NA 118* 118* 120* 119* 122*  K 3.9 4.4  --   --  3.9  CL 87* 85*  --   --  91*  CO2 26 23  --   --  22   GLUCOSE 126* 123*  --   --  83  BUN 11 12  --   --  11  CREATININE 0.81 0.86  --   --  0.86  CALCIUM 8.9 8.7*  --   --  8.4*   GFR: Estimated Creatinine Clearance: 75.2 mL/min (by C-G formula based on SCr of 0.86 mg/dL). Liver Function Tests: Recent Labs  Lab 02/10/22 1029 02/11/22 0535  AST 15 16  ALT 8 10  ALKPHOS 77 58  BILITOT 0.4 0.7  PROT 5.9* 5.3*  ALBUMIN 3.5 2.9*   No results for input(s): "LIPASE", "AMYLASE" in the last 168 hours. No results for input(s): "AMMONIA" in the last 168 hours. Coagulation Profile: No results for input(s): "INR", "PROTIME" in the last 168 hours. Cardiac Enzymes: No results for input(s): "CKTOTAL", "CKMB", "CKMBINDEX", "TROPONINI" in the last 168 hours. BNP (last 3 results) No results for input(s): "PROBNP" in the last 8760 hours. HbA1C: No results for input(s): "HGBA1C" in the last 72 hours. CBG: Recent Labs  Lab 02/10/22 1142  GLUCAP 118*   Lipid Profile: No results for input(s): "CHOL", "HDL", "LDLCALC", "TRIG", "CHOLHDL", "LDLDIRECT" in the last 72 hours. Thyroid Function Tests: No results for input(s): "TSH", "T4TOTAL", "FREET4", "T3FREE", "THYROIDAB" in the last 72 hours. Anemia Panel: No results for input(s): "VITAMINB12", "FOLATE", "FERRITIN", "TIBC", "IRON", "RETICCTPCT" in the last 72 hours. Sepsis Labs: No results for input(s): "PROCALCITON", "LATICACIDVEN" in the last 168 hours.  Recent Results (from the past 240 hour(s))  MRSA Next Gen by PCR, Nasal     Status: None   Collection Time: 02/10/22  9:31 PM   Specimen: Nasal Mucosa; Nasal Swab  Result Value Ref Range Status   MRSA by PCR Next Gen NOT DETECTED NOT DETECTED Final    Comment: (NOTE) The GeneXpert MRSA Assay (FDA approved for NASAL specimens only), is one component of a comprehensive MRSA colonization surveillance program. It is not intended to diagnose MRSA infection nor to guide or monitor treatment for MRSA infections. Test performance is not FDA  approved in patients less than 69 years old. Performed at [facility], [place] [address], [place], [place] [address]          Radiology Studies: MR Brain W and Wo Contrast  Result Date: 02/10/2022 CLINICAL DATA:  Metastatic disease.  Assess treatment response. EXAM: MRI HEAD WITHOUT AND WITH CONTRAST TECHNIQUE: Multiplanar, multiecho pulse sequences of the brain and surrounding structures were obtained without and with intravenous contrast. CONTRAST:  15mL GADAVIST GADOBUTROL 1 MMOL/ML IV SOLN COMPARISON:  CT same day.  MRI 12/03/2021. 07/29/2021. FINDINGS: Brain: Diffusion imaging does not show any acute or subacute infarction or other cause of restricted diffusion. Posterior fossa appears normal. No residual finding related to the treated vermian metastatic lesion. Right cerebral hemisphere appears normal. No residual finding relating to the previously treated medial right occipital metastasis. Treated left temporal lesion continues to contract, axial diameter 17 x 18 mm compared with 22  x 25 mm in November. No new or progressive finding. Reduction in T2 FLAIR signal within the left temporal lobe. No new lesion seen in the left hemisphere. No hydrocephalus.  No extra-axial collection. Vascular: Major vessels at the base of the brain show flow. Skull and upper cervical spine: Negative Sinuses/Orbits: Clear/normal Other: None IMPRESSION: 1. No evidence of new or progressive metastatic disease. 2. Treated left temporal lesion continues to contract, axial diameter 17 x 18 mm compared with 22 x 25 mm in November. Reduction in T2 FLAIR signal within the left temporal lobe. 3. No residual finding related to the treated right occipital metastasis. 4. No residual finding related to the treated vermian metastasis. Electronically Signed   By: Nelson Chimes M.D.   On: 02/10/2022 15:11   CT Head Wo Contrast  Result Date: 02/10/2022 CLINICAL DATA:  Sinusitis. Acute. Orbital or intracranial  complication suspected. Seizure. History of lung cancer. EXAM: CT HEAD WITHOUT CONTRAST TECHNIQUE: Contiguous axial images were obtained from the base of the skull through the vertex without intravenous contrast. RADIATION DOSE REDUCTION: This exam was performed according to the departmental dose-optimization program which includes automated exposure control, adjustment of the mA and/or kV according to patient size and/or use of iterative reconstruction technique. COMPARISON:  05/01/2021.  12/04/2021 MRI. FINDINGS: Brain: No CT abnormality visible in the brainstem or cerebellum. Indistinct low-density in the left anterior temporal lobe relates to a treated lesion in that region. There appears to be less mass effect and edema than was seen on recent prior imaging, but this could certainly be a seizure focus. Cerebral hemispheres otherwise show atrophy and chronic small-vessel ischemic change. Other small treated lesions are not visible. No hydrocephalus or extra-axial collection. MRI of the brain with contrast would be appropriate if detailed evaluation of the metastatic disease is desired. Vascular: There is atherosclerotic calcification of the major vessels at the base of the brain. Skull: Negative Sinuses/Orbits: Sinuses are clear.  Orbits are normal. Other: Mastoids are clear. IMPRESSION: Indistinct low-density in the left anterior temporal lobe relates to a treated metastatic lesion in that region. There appears to be less mass effect and edema than was seen on recent prior imaging, but this could certainly be a seizure focus. None of the other previously seen treated lesions are discernible by CT. MRI of the brain with contrast would be appropriate if detailed evaluation of the metastatic disease is desired. Electronically Signed   By: Nelson Chimes M.D.   On: 02/10/2022 13:08        Scheduled Meds:  Chlorhexidine Gluconate Cloth  6 each Topical Daily   dexamethasone  2 mg Oral Daily   levETIRAcetam   500 mg Oral BID   levothyroxine  25 mcg Oral QAC breakfast   pantoprazole  40 mg Oral Daily   Continuous Infusions:  sodium chloride (hypertonic) 75 mL/hr at 02/11/22 0654     LOS: 1 day    Time spent: 35 minutes.     Elmarie Shiley, MD [facility]   If 7PM-7AM, please contact night-coverage [email or web address]  02/11/2022, 7:47 AM

## 2022-02-11 NOTE — Progress Notes (Signed)
Chaplain received a consult to provide support to Peter Brown.  Peter Brown and his wife, Peter Brown, requested that chaplain pray with them.  Following the prayer, chaplain assessed for other needs.  No other needs stated and Peter Brown wanted to continue to rest.  Chaplain will continue to follow for support needs, but please page or consult as needs arise.  [address], [place] Pager, [phone]

## 2022-02-11 NOTE — TOC Initial Note (Signed)
Transition of Care [facility]) - Initial/Assessment Note    Patient Details  Name: Peter Brown MRN: [id number] Date of Birth: Jan 27, 1940  [facility] CM/SW Contact:    Roseanne Kaufman, RN Phone Number: 02/11/2022, 6:05 PM  Clinical Narrative:   Per chart review patient presented from [place] with absence seizures, severe hyponatremia, adenocarcinoma of lung mets to brain. This RNCM received TOC consult for HH/DME. Per chart review PT has not assessed the patient yet.  This RNCM spoke with patient's wife Peter Brown who reports, the patient will need a [place] agency that will come to [facility]. Prior to admission patient uses a walker, cane and CPAP, with home oxygen provided by [employer]. Patient's wife Peter Brown reports the patient does have portable oxygen tank in her car for use at discharge. Patient's wife reports the patient will need [place] services to have someone come to check vitals. This RNCM explained the frequency of home health visits.   TOC will continue to follow for discharge needs.                  Barriers to Discharge: Continued Medical Work up   Patient Goals and CMS Choice  Return home with home health services.          Expected Discharge Plan and Services In-house Referral: Chaplain Discharge Planning Services: CM Consult   Living arrangements for the past 2 months: Single Family Home                                      Prior Living Arrangements/Services Living arrangements for the past 2 months: Single Family Home Lives with:: Spouse                   Activities of Daily Living Home Assistive Devices/Equipment: Environmental consultant (specify type) ADL Screening (condition at time of admission) Patient's cognitive ability adequate to safely complete daily activities?: No Is the patient deaf or have difficulty hearing?: No Does the patient have difficulty seeing, even when wearing glasses/contacts?: No Does the patient have difficulty concentrating,  remembering, or making decisions?: Yes Patient able to express need for assistance with ADLs?: Yes Does the patient have difficulty dressing or bathing?: Yes Independently performs ADLs?: No Does the patient have difficulty walking or climbing stairs?: Yes Weakness of Legs: Both Weakness of Arms/Hands: None  Permission Sought/Granted                  Emotional Assessment         Alcohol / Substance Use: Not Applicable Psych Involvement: No (comment)  Admission diagnosis:  Hyponatremia [E87.1] Seizure ([place]) [R56.9] Patient Active Problem List   Diagnosis Date Noted   Hyponatremia 02/10/2022   Absence seizure ([place]) 02/10/2022   Multifocal pneumonia 08/24/2021   Normocytic anemia 08/24/2021   Mild protein malnutrition ([place]) 08/24/2021   Fatigue 07/21/2021   Acquired hypothyroidism 06/24/2021   Hardening of the aorta (main artery of the heart) ([place]) 03/19/2021   Port-A-Cath in place 02/02/2021   Encounter for antineoplastic immunotherapy 11/10/2020   Allergic rhinitis 10/14/2020   Disturbance of skin sensation 10/14/2020   Generalized osteoarthrosis, unspecified site 10/14/2020   Mixed hyperlipidemia 10/14/2020   Other psoriasis 10/14/2020   Spondylosis, lumbosacral 10/14/2020   Drug rash 09/04/2020   Malignant neoplasm metastatic to brain ([place]) 08/21/2020   Adenocarcinoma, lung, left ([place]) 08/04/2020   Encounter for antineoplastic chemotherapy 08/04/2020  Lung nodules 07/14/2020   Pleural effusion, left 07/14/2020   Exposure to asbestos 07/02/2020   Mass of upper lobe of left lung 07/02/2020   CKD (chronic kidney disease) stage 2, GFR 60-89 ml/min 08/17/2019   Lumbar radiculopathy 01/19/2018   Hyperglycemia 04/18/2017   Diverticulosis of large intestine without hemorrhage 12/30/2014   Calculus of kidney 03/28/2014   Barrett's esophagus without dysplasia 11/28/2013   Gastroesophageal reflux disease with esophagitis without hemorrhage 11/27/2013   Hemorrhoids,  internal 11/27/2013   Hiatal hernia 11/27/2013   Polyp of colon, adenomatous 11/27/2013   Anxiety 01/03/2013   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 [place], [place] [name] [place] [address] Phone: [phone] Fax: [phone]     Social Determinants of Health (SDOH) Social History: SDOH Screenings   Food Insecurity: No Food Insecurity (02/11/2022)  Housing: Low Risk  (02/11/2022)  Transportation Needs: No Transportation Needs (02/11/2022)  Utilities: Not At Risk (02/11/2022)  Financial Resource Strain: Low Risk  (08/22/2020)  Social Connections: Socially Integrated (08/22/2020)  Stress: Stress Concern Present (08/22/2020)  Tobacco Use: Low Risk  (02/11/2022)   SDOH Interventions:     Readmission Risk Interventions     No data to display

## 2022-02-11 NOTE — Progress Notes (Signed)
82 y.o male with Stage IV (T1b, N2, M1a) non-small cell lung cancer, adenocarcinoma  with mets to lung and solitary left temporal brain metastasis who presented to the ED with seizures. Patient found to have a critical sodium of 118 and was started on NS. No improvement with NS and given pt symptomatic, hypertonic saline was started.    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) with concomitant hypo-osmolality (serum osmolality < 280 mOsm/kg) urine osmolality pending #Non-Small Cell Lung Cancer  Background non-small cell lung cancer, adenocarcinoma presented with left upper lobe lung nodule in addition to mediastinal lymphadenopathy and pleural-based metastasis as well mets to brain presenting with seizures found to have critical Na+118  -Start hypertonic 3% saline goal correction no >86mEq /Lover 2hours or > 6 mEq/L over 4 hours -Frequent Neuro checks -Serial sodium -Consider Tolvaptan if no improvement    Rufina Falco, DNP, CCRN, FNP-C, AGACNP-BC Acute Care & Family Nurse Practitioner  [facility]  See Amion for personal pager PCCM on call pager [phone] until 7 am

## 2022-02-11 NOTE — Consult Note (Signed)
Renal Service Consult Note [facility]  Peter Brown 02/11/2022 Sol Blazing, MD Requesting Physician: Dr Tyrell Antonio  Reason for Consult: Hyponatremia HPI: The patient is a 82 y.o. year-old w/ PMH as below who presented to cancer center where he had an absence seizures and his labs came back showing Na+ 118. Hx of lung cancer. Pt sent to ED where VS stable and no hypoxia. Hb 9.6, creat wnl. Tprot 5.9. glucose 126. CT head showed treated metastatic lesion w/ less edema/ mass effect. MRI showed no new lesions. Pt rec'd 500 ml NS bolus and 1 gm keppra IV. Pt was admitted and rec'd some IV NS fluids. Early this am around 3- 4am pt was started on IV 3% saline at 50- 75 cc/hr and this am Na+ levels were 122 and 123.  3% saline was lowered to 35 cc/hr around 8 am. We are asked to see for hyponatremia.   Pt seen in room, per wife he has been confused, but remains pleasant.  No LE edema, no abd swelling or SOB.  No hx CHF or liver failure.   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 ICD10 Conversion   Past Surgical History  Past Surgical History:  Procedure Laterality Date   IR IMAGING GUIDED PORT INSERTION  11/24/2020   KNEE SURGERY     orthoscopic     TONSILLECTOMY AND ADENOIDECTOMY     Family History History reviewed. No pertinent family history. Social History  reports that he has never smoked. He has never used smokeless tobacco. He reports that he does not drink alcohol  and does not use drugs. Allergies  Allergies  Allergen Reactions   Albuterol Sulfate Other (See Comments)    The inhaler caused chest and back pain   Tamsulosin Other (See Comments)    Caused dizziness/weakness- patient STOPPED taking this   Home medications Prior to Admission medications   Medication Sig Start Date End Date Taking? Authorizing Provider  aspirin 81 MG EC tablet Take 81 mg by mouth in the morning.   Yes [provider]  Bacillus Coagulans-Inulin (PROBIOTIC) 1-250 BILLION-MG CAPS Take 1 capsule by mouth in the morning.   Yes [provider]  bisacodyl (DULCOLAX) 5 MG EC tablet Take 5 mg by mouth daily as needed for moderate constipation.   Yes [provider]  Cholecalciferol (D3 PO) Take 1 capsule by mouth in the morning.   Yes [provider]  coal tar-salicylic acid 2 % shampoo Apply 1 application  topically every 7 (seven) days.   Yes [provider]  dexamethasone (DECADRON) 1 MG tablet Take 2 mg by mouth daily with breakfast. 01/27/22  Yes [provider]  docusate sodium (COLACE) 100 MG capsule Take 100 mg by mouth 3 (three) times daily as needed for mild constipation.   Yes [provider]  DULoxetine (CYMBALTA) 30 MG capsule Take 30 mg by mouth in the morning. 06/19/19  Yes [provider]  fexofenadine [place] Freeze)  180 MG tablet Take 180 mg by mouth daily as needed for allergies.   Yes [provider]  fluocinonide cream (LIDEX) 1.01 % Apply 1 application  topically 2 (two) times daily as needed (for psoriasis).   Yes [provider]  folic acid (FOLVITE) 1 MG tablet TAKE 1 TABLET(1 MG) BY MOUTH DAILY Patient taking differently: Take 1 mg by mouth in the morning. 12/29/20  Yes Curt Bears, MD  hydrocortisone cream 1 % Apply 1 application topically 2 (two) times daily. Patient taking differently: Apply 1 application  topically 2 (two) times daily as needed (for rashes). 09/04/20   Yes Heilingoetter, Cassandra L, PA-C  ketoconazole (NIZORAL) 2 % cream Apply 1 application  topically daily as needed (rash).   Yes [provider]  Astrid Drafts (OMEGA-3) 500 MG CAPS Take 500 mg by mouth in the morning.   Yes [provider]  levothyroxine (SYNTHROID) 25 MCG tablet TAKE 1 TABLET(25 MCG) BY MOUTH DAILY BEFORE BREAKFAST Patient taking differently: Take 25 mcg by mouth daily before breakfast. 01/11/22  Yes Heilingoetter, Cassandra L, PA-C  lidocaine-prilocaine (EMLA) cream Apply 1 application topically as needed. Patient taking differently: Apply 1 application  topically daily as needed (access port). 12/12/20  Yes Curt Bears, MD  Magnesium 500 MG TABS Take 500 mg by mouth at bedtime.   Yes [provider]  Melatonin 12 MG TABS Take 12 mg by mouth at bedtime.   Yes [provider]  omeprazole (PRILOSEC) 40 MG capsule Take 40 mg by mouth in the morning.   Yes [provider]  oxyCODONE-acetaminophen (PERCOCET/ROXICET) 5-325 MG tablet Take 1 tablet by mouth every 8 (eight) hours as needed for severe pain. 01/20/22  Yes Curt Bears, MD  tamsulosin (FLOMAX) 0.4 MG CAPS capsule Take 0.4 mg by mouth at bedtime. Patient not taking: Reported on 02/10/2022 10/01/21   [provider]     Vitals:   02/11/22 1100 02/11/22 1125 02/11/22 1200 02/11/22 1300  BP: (!) 132/44  133/68 (!) 124/44  Pulse: 72  76 71  Resp: (!) 21  17 19   Temp:  (!) 96.9 F (36.1 C)    TempSrc:  Axillary    SpO2: 94%  97% 95%   Exam Gen alert, no distress No rash, cyanosis or gangrene Sclera anicteric, throat clear  No jvd or bruits Chest clear bilat to bases, no rales/ wheezing RRR no MRG Abd soft ntnd no mass or ascites +bs GU normal male MS no joint effusions or deformity Ext no LE or UE edema, no wounds or ulcers Neuro is alert, nonfocal, pleasant      Home meds include - asa, decadron, colace, cymbalta, percocet prn, synthoid, prilosec,  flomax, prns/ vits/ supps   UNa 97, UOsm 456  Assessment/ Plan: Hyponatremia - pt is euvolemic on exam, w/ hx of lung cancer and normal urine Na, suspect SIADH.  No signs of CHF / liver disease / renal failure. TSH up a bit. Will get am cortisol. 3% saline was started overnight early this am. Na+ up to 127 at noon today, and the last 118 was at 7 pm. Goal correction 6-8 meq/ L per 24 hr period so 3% saline is put on hold until Na+ is 126 or under then will resume 3% at lower rate of 25 cc/hr.  Adenocarcinoma of L lung w/ brain mets - under active treatment Hypothyroidism - takes synthroid    Kelly Splinter  MD 02/11/2022, 2:11 PM Recent Labs  Lab 02/10/22  1029 02/10/22 1922 02/11/22 0535 02/11/22 0747  HGB 9.6*  --  8.5*  --   ALBUMIN 3.5  --  2.9*  --   CALCIUM 8.9   < > 8.4* 8.3*  CREATININE 0.81   < > 0.86 0.90  K 3.9   < > 3.9 3.5   < > = values in this interval not displayed.   Inpatient medications:  Chlorhexidine Gluconate Cloth  6 each Topical Daily   dexamethasone  2 mg Oral Daily   levETIRAcetam  500 mg Oral BID   levothyroxine  25 mcg Oral QAC breakfast   pantoprazole  40 mg Oral Daily    sodium chloride (hypertonic)     acetaminophen **OR** acetaminophen, ondansetron **OR** ondansetron (ZOFRAN) IV, mouth rinse

## 2022-02-12 DIAGNOSIS — E871 Hypo-osmolality and hyponatremia: Secondary | ICD-10-CM | POA: Diagnosis not present

## 2022-02-12 LAB — BASIC METABOLIC PANEL
Anion gap: 4 — ABNORMAL LOW (ref 5–15)
BUN: 13 mg/dL (ref 8–23)
CO2: 23 mmol/L (ref 22–32)
Calcium: 8.4 mg/dL — ABNORMAL LOW (ref 8.9–10.3)
Chloride: 99 mmol/L (ref 98–111)
Creatinine, Ser: 0.86 mg/dL (ref 0.61–1.24)
GFR, Estimated: >60 mL/min (ref >60)
Glucose, Bld: 85 mg/dL (ref 70–99)
Potassium: 3.8 mmol/L (ref 3.5–5.1)
Sodium: 126 mmol/L — ABNORMAL LOW (ref 135–145)

## 2022-02-12 LAB — SODIUM
Sodium: 126 mmol/L — ABNORMAL LOW (ref 135–145)
Sodium: 128 mmol/L — ABNORMAL LOW (ref 135–145)
Sodium: 126 mmol/L — ABNORMAL LOW (ref 135–145)

## 2022-02-12 LAB — CBC
HCT: 25.7 % — ABNORMAL LOW (ref 39.0–52.0)
Hemoglobin: 8.5 g/dL — ABNORMAL LOW (ref 13.0–17.0)
MCH: 31 pg (ref 26.0–34.0)
MCHC: 33.1 g/dL (ref 30.0–36.0)
MCV: 93.8 fL (ref 80.0–100.0)
Platelets: 188 10*3/uL (ref 150–400)
RBC: 2.74 MIL/uL — ABNORMAL LOW (ref 4.22–5.81)
RDW: 16.3 % — ABNORMAL HIGH (ref 11.5–15.5)
WBC: 5.9 10*3/uL (ref 4.0–10.5)
nRBC: 0 % (ref 0.0–0.2)

## 2022-02-12 LAB — CORTISOL-AM, BLOOD: Cortisol - AM: 0.9 ug/dL — ABNORMAL LOW (ref 6.7–22.6)

## 2022-02-12 MED ORDER — FUROSEMIDE 20 MG PO TABS
10.0000 mg | ORAL_TABLET | Freq: Every day | ORAL | Status: DC
  Administered 2022-02-13 – 2022-02-14 (×2): 10 mg via ORAL
  Filled 2022-02-12 (×2): qty 1

## 2022-02-12 MED ORDER — FUROSEMIDE 20 MG PO TABS
10.0000 mg | ORAL_TABLET | Freq: Two times a day (BID) | ORAL | Status: DC
  Administered 2022-02-12: 10 mg via ORAL
  Filled 2022-02-12: qty 1

## 2022-02-12 MED ORDER — SODIUM CHLORIDE 3 % IV SOLN
INTRAVENOUS | Status: DC
  Filled 2022-02-12: qty 500

## 2022-02-12 MED ORDER — BISACODYL 5 MG PO TBEC
5.0000 mg | DELAYED_RELEASE_TABLET | Freq: Every day | ORAL | Status: DC | PRN
  Administered 2022-02-12 – 2022-02-14 (×3): 5 mg via ORAL
  Filled 2022-02-12 (×3): qty 1

## 2022-02-12 MED ORDER — SODIUM CHLORIDE 1 G PO TABS
2.0000 g | ORAL_TABLET | Freq: Three times a day (TID) | ORAL | Status: DC
  Administered 2022-02-12 – 2022-02-14 (×7): 2 g via ORAL
  Filled 2022-02-12 (×7): qty 2

## 2022-02-12 MED ORDER — POLYETHYLENE GLYCOL 3350 17 G PO PACK
17.0000 g | PACK | Freq: Every day | ORAL | Status: DC
  Administered 2022-02-12: 17 g via ORAL
  Filled 2022-02-12: qty 1

## 2022-02-12 NOTE — Progress Notes (Signed)
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  Patient was confused on admission.  CT head without contrast showed treated metastatic lesion in the left anterior temporal lobe.  There appears to be less edema and mass effect.  MRI of the brain with no evidence of new or progressive metastatic disease.   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  Urine osmolality 456 Treated initially with  Hypertonic saline, sodium increase to 126. Plan to stop hypertonic saline and start sodium tablet and oral lasix.  Nephrology consulted.  Continue to monitor Sodium level every 8 hours.  TSH: 1.6 Sodium increase to 128.   Absence seizure: In setting of Hyponatremia.  Discussed with Dr Sherrilee Gilles patient at risk for seizure from temporal lesion, ok to continue with Keppra.    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  Follow up outpatient.  Continue with Keppra for seizure prophylaxis.  On Decadron.   Esophagus without dysplasia Continue wit PPI>   Hyperlipidemia Management per PCP./   Normocytic anemia Monitor hb.   Hypothyroidism: TSH. 1.6 Continue  with Synthroid.   Constipation; start miralax, dulcolax.  Depression; resume Cymbalta        Estimated body mass index is 26.22 kg/m as calculated from the following:   Height as of 08/24/21: 6' (1.829 m).   Weight as of this encounter: 87.7 kg.   DVT prophylaxis: SCD Code Status: Full code Family Communication: Son in law who was at bedside.  Disposition Plan:  Status is: Inpatient Remains inpatient appropriate because: management to severe hyponatremia.     Consultants:  Nephrology   Procedures:    Antimicrobials:    Subjective: No BM since Monday. He is alert, able to answer questions. He is very pleasant.  Objective: Vitals:   02/12/22 1100 02/12/22 1200 02/12/22 1300 02/12/22 1500  BP: (!) 140/67 (!) 140/84 (!) 119/94 (!) 157/67  Pulse: 64 74 80 78  Resp: 16 19 19 17   Temp:  (!) 97.5 F (36.4 C)    TempSrc:  Oral    SpO2: 95% 96% 94% (!) 89%  Weight:        Intake/Output Summary (Last 24 hours) at 02/12/2022 1555 Last data filed at 02/12/2022 1500 Gross per 24 hour  Intake 979.61 ml  Output 2300 ml  Net -1320.39 ml    Filed Weights   02/12/22 0500  Weight: 87.7 kg    Examination:  General exam: NAD  Respiratory system: CTA Cardiovascular system: S 1, S2  RRR Gastrointestinal system: BS present, soft, nt Central nervous system: alert, follows command Extremities: no edema    Data Reviewed: I have personally reviewed following labs and imaging studies  CBC: Recent Labs  Lab 02/10/22 1029 02/11/22 0535  02/12/22 0620  WBC 6.9 5.4 5.9  NEUTROABS 4.7  --   --   HGB 9.6* 8.5* 8.5*  HCT 27.3* 24.7* 25.7*  MCV 89.2 91.1 93.8  PLT 256 183 706    Basic Metabolic Panel: Recent Labs  Lab 02/10/22 1029 02/10/22 1922 02/11/22 0001 02/11/22 0535 02/11/22 0747 02/11/22 1212 02/11/22 2041 02/11/22 2247 02/12/22 0207 02/12/22 0620 02/12/22 1011  NA 118* 118*   < > 122* 123*   < > 125* 126* 126* 126* 128*  K 3.9 4.4  --  3.9 3.5  --    --   --   --  3.8  --   CL 87* 85*  --  91* 95*  --   --   --   --  99  --   CO2 26 23  --  22 21*  --   --   --   --  23  --   GLUCOSE 126* 123*  --  83 115*  --   --   --   --  85  --   BUN 11 12  --  11 11  --   --   --   --  13  --   CREATININE 0.81 0.86  --  0.86 0.90  --   --   --   --  0.86  --   CALCIUM 8.9 8.7*  --  8.4* 8.3*  --   --   --   --  8.4*  --    < > = values in this interval not displayed.    GFR: Estimated Creatinine Clearance: 75.2 mL/min (by C-G formula based on SCr of 0.86 mg/dL). Liver Function Tests: Recent Labs  Lab 02/10/22 1029 02/11/22 0535  AST 15 16  ALT 8 10  ALKPHOS 77 58  BILITOT 0.4 0.7  PROT 5.9* 5.3*  ALBUMIN 3.5 2.9*    No results for input(s): "LIPASE", "AMYLASE" in the last 168 hours. No results for input(s): "AMMONIA" in the last 168 hours. Coagulation Profile: No results for input(s): "INR", "PROTIME" in the last 168 hours. Cardiac Enzymes: No results for input(s): "CKTOTAL", "CKMB", "CKMBINDEX", "TROPONINI" in the last 168 hours. BNP (last 3 results) No results for input(s): "PROBNP" in the last 8760 hours. HbA1C: No results for input(s): "HGBA1C" in the last 72 hours. CBG: Recent Labs  Lab 02/10/22 1142  GLUCAP 118*    Lipid Profile: No results for input(s): "CHOL", "HDL", "LDLCALC", "TRIG", "CHOLHDL", "LDLDIRECT" in the last 72 hours. Thyroid Function Tests: Recent Labs    02/11/22 0840  TSH 1.657   Anemia Panel: No results for input(s): "VITAMINB12", "FOLATE", "FERRITIN", "TIBC", "IRON", "RETICCTPCT" in the last 72 hours. Sepsis Labs: No results for input(s): "PROCALCITON", "LATICACIDVEN" in the last 168 hours.  Recent Results (from the past 240 hour(s))  MRSA Next Gen by PCR, Nasal     Status: None   Collection Time: 02/10/22  9:31 PM   Specimen: Nasal Mucosa; Nasal Swab  Result Value Ref Range Status   MRSA by PCR Next Gen NOT DETECTED NOT DETECTED Final    Comment: (NOTE) The GeneXpert MRSA Assay (FDA  approved for NASAL specimens only), is one component of a comprehensive MRSA colonization surveillance program. It is not intended to diagnose MRSA infection nor to guide or monitor treatment for MRSA infections. Test performance is not FDA approved in patients less than 74 years old. Performed at [facility], [address] [place],  [place] [address]          Radiology Studies: No results found.      Scheduled Meds:  Chlorhexidine Gluconate Cloth  6 each Topical Daily   dexamethasone  2 mg Oral Daily   DULoxetine  30 mg Oral Daily   [START ON 02/13/2022] furosemide  10 mg Oral Daily   levETIRAcetam  500 mg Oral BID   levothyroxine  25 mcg Oral QAC breakfast   pantoprazole  40 mg Oral Daily   polyethylene glycol  17 g Oral Daily   sodium chloride  2 g Oral TID WC   Continuous Infusions:     LOS: 2 days    Time spent: 35 minutes.     Elmarie Shiley, MD [facility]   If 7PM-7AM, please contact night-coverage [email or web address]  02/12/2022, 3:55 PM

## 2022-02-12 NOTE — Plan of Care (Signed)

## 2022-02-12 NOTE — Progress Notes (Signed)
[facility] Progress Note  Subjective: seen in room, more alert  Vitals:   02/12/22 0500 02/12/22 0600 02/12/22 0700 02/12/22 0800  BP: 139/75 (!) 153/70 128/65 (!) 151/79  Pulse: 80 65 66 65  Resp: 15 15 19    Temp:    97.6 F (36.4 C)  TempSrc:    Oral  SpO2: 96% 93% 96% 94%  Weight: 87.7 kg       Exam: Gen alert, no distress No jvd or bruits Chest clear bilat to bases RRR no MRG Abd soft ntnd no mass or ascites +bs Ext no LE or UE edema Neuro is alert, nonfocal, pleasant      Home meds include - asa, decadron, colace, cymbalta, percocet prn, synthoid, prilosec, flomax, prns/ vits/ supps    UNa 97, UOsm 456   Assessment/ Plan: Hyponatremia - pt euvolemic on exam, w/ hx of lung cancer and normal urine Na, suspected SIADH.  No signs of CHF / liver disease / renal failure. TSH up a bit and am cortisol pending . 3% saline was started and Na+ up to 126 this am. Will dc 3% saline. Plan is for salt tabs, low dose po lasix and 1000 cc fluid restriction. Check Na+ q 8 hrs. Will follow.  Adenocarcinoma of L lung w/ brain mets - under active treatment Hypothyroidism - takes synthroid     Kelly Splinter MD [facility] 02/12/2022, 8:29 AM  Recent Labs  Lab 02/10/22 1029 02/10/22 1922 02/11/22 0535 02/11/22 0747 02/12/22 0620  HGB 9.6*  --  8.5*  --  8.5*  ALBUMIN 3.5  --  2.9*  --   --   CALCIUM 8.9   < > 8.4* 8.3* 8.4*  CREATININE 0.81   < > 0.86 0.90 0.86  K 3.9   < > 3.9 3.5 3.8   < > = values in this interval not displayed.   No results for input(s): "IRON", "TIBC", "FERRITIN" in the last 168 hours. Inpatient medications:  Chlorhexidine Gluconate Cloth  6 each Topical Daily   dexamethasone  2 mg Oral Daily   DULoxetine  30 mg Oral Daily   levETIRAcetam  500 mg Oral BID   levothyroxine  25 mcg Oral QAC breakfast   pantoprazole  40 mg Oral Daily   polyethylene glycol  17 g Oral Daily    sodium chloride (hypertonic) 25 mL/hr at 02/12/22 0800   acetaminophen  **OR** acetaminophen, bisacodyl, ondansetron **OR** ondansetron (ZOFRAN) IV, mouth rinse

## 2022-02-13 DIAGNOSIS — E871 Hypo-osmolality and hyponatremia: Secondary | ICD-10-CM | POA: Diagnosis not present

## 2022-02-13 LAB — BASIC METABOLIC PANEL
Anion gap: 9 (ref 5–15)
BUN: 15 mg/dL (ref 8–23)
CO2: 25 mmol/L (ref 22–32)
Calcium: 8.6 mg/dL — ABNORMAL LOW (ref 8.9–10.3)
Chloride: 97 mmol/L — ABNORMAL LOW (ref 98–111)
Creatinine, Ser: 1 mg/dL (ref 0.61–1.24)
GFR, Estimated: >60 mL/min (ref >60)
Glucose, Bld: 107 mg/dL — ABNORMAL HIGH (ref 70–99)
Potassium: 4.2 mmol/L (ref 3.5–5.1)
Sodium: 131 mmol/L — ABNORMAL LOW (ref 135–145)

## 2022-02-13 LAB — SODIUM
Sodium: 133 mmol/L — ABNORMAL LOW (ref 135–145)
Sodium: 132 mmol/L — ABNORMAL LOW (ref 135–145)
Sodium: 131 mmol/L — ABNORMAL LOW (ref 135–145)

## 2022-02-13 MED ORDER — SENNOSIDES-DOCUSATE SODIUM 8.6-50 MG PO TABS
1.0000 | ORAL_TABLET | Freq: Two times a day (BID) | ORAL | Status: DC
  Administered 2022-02-13 – 2022-02-14 (×3): 1 via ORAL
  Filled 2022-02-13 (×3): qty 1

## 2022-02-13 MED ORDER — SODIUM CHLORIDE 0.9% FLUSH
10.0000 mL | INTRAVENOUS | Status: DC | PRN
  Administered 2022-02-14: 10 mL

## 2022-02-13 MED ORDER — POLYETHYLENE GLYCOL 3350 17 G PO PACK
17.0000 g | PACK | Freq: Two times a day (BID) | ORAL | Status: DC
  Administered 2022-02-13 – 2022-02-14 (×3): 17 g via ORAL
  Filled 2022-02-13 (×3): qty 1

## 2022-02-13 NOTE — Progress Notes (Signed)
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  Patient was confused on admission.  CT head without contrast showed treated metastatic lesion in the left anterior temporal lobe.  There appears to be less edema and mass effect.  MRI of the brain with no evidence of new or progressive metastatic disease.   Assessment & Plan:   Principal Problem:   Hyponatremia Active Problems:   Adenocarcinoma, lung, left (HCC)   Malignant neoplasm metastatic to brain ([place])   Barrett's esophagus without dysplasia   Hiatal hernia   Mixed hyperlipidemia   Normocytic anemia   Acquired hypothyroidism   Absence seizure (HCC)  1-Severe hyponatremia: Presented with absence Seizure.  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  Urine osmolality 456 Treated initially with  Hypertonic saline, sodium increase to 126. Plan to stop hypertonic saline and start sodium tablet and oral lasix.  Nephrology consulted.  Continue to monitor Sodium level every 6 hours.  TSH: 1.6, cortisol low but he is already  He was started on hypertonic last night for sodium down to 126. Stop hypertonic. Sodium 131.  Continue with lasix and sodium tablet.   Absence seizure: In setting of Hyponatremia.  Discussed with Dr Sherrilee Gilles patient at risk for seizure from temporal lesion, ok to continue with Keppra.    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  Follow up outpatient.  Continue with Keppra for seizure prophylaxis.  On Decadron.   Esophagus without  dysplasia Continue wit PPI>   Hyperlipidemia Management per PCP./   Normocytic anemia Monitor hb.   Hypothyroidism: TSH. 1.6 Continue with Synthroid.   Constipation; continue with  miralax, dulcolax. Will add senna Depression; Continue with  Cymbalta        Estimated body mass index is 26.4 kg/m as calculated from the following:   Height as of this encounter: 6' 0.01" (1.829 m).   Weight as of this encounter: 88.3 kg.   DVT prophylaxis: SCD Code Status: Full code Family Communication: wife at bedside.  Disposition Plan:  Status is: Inpatient Remains inpatient appropriate because: management to severe hyponatremia.     Consultants:  Nephrology   Procedures:    Antimicrobials:    Subjective: No BM yet.  No new complaints. He is less confuse per wife.   Objective: Vitals:   02/13/22 0300 02/13/22 0400 02/13/22 0500 02/13/22 0538  BP: (!) 132/94 (!) 152/66 (!) 156/78   Pulse: 71 64 68   Resp: 12 16 15    Temp:  97.9 F (36.6 C)    TempSrc:  Axillary    SpO2: 96% 91% 97%   Weight:    88.3 kg  Height:        Intake/Output Summary (Last 24 hours) at 02/13/2022 0708 Last data filed at 02/13/2022 2130 Gross per 24 hour  Intake 942.15 ml  Output 1800 ml  Net -857.85 ml    Filed Weights   02/12/22 0500 02/12/22 2225 02/13/22 0538  Weight: 87.7 kg 87.7 kg 88.3 kg    Examination:  General exam: NAD Respiratory system: CTA Cardiovascular system: S ,1, S 2  RRR Gastrointestinal system: BS present. Soft, nt Central nervous system: Alert, follows command Extremities: no edema    Data Reviewed: I have personally reviewed following labs and imaging studies  CBC: Recent Labs  Lab 02/10/22 1029 02/11/22 0535 02/12/22 0620  WBC 6.9 5.4 5.9  NEUTROABS 4.7  --   --   HGB 9.6* 8.5* 8.5*  HCT 27.3* 24.7* 25.7*  MCV 89.2 91.1 93.8  PLT 256 183 283    Basic Metabolic Panel: Recent Labs  Lab 02/10/22 1922 02/11/22 0001 02/11/22 0535  02/11/22 0747 02/11/22 1212 02/12/22 0207 02/12/22 0620 02/12/22 1011 02/12/22 1801 02/13/22 0255  NA 118*   < > 122* 123*   < > 126* 126* 128* 126* 131*  K 4.4  --  3.9 3.5  --   --  3.8  --   --  4.2  CL 85*  --  91* 95*  --   --  99  --   --  97*  CO2 23  --  22 21*  --   --  23  --   --  25  GLUCOSE 123*  --  83 115*  --   --  85  --   --  107*  BUN 12  --  11 11  --   --  13  --   --  15  CREATININE 0.86  --  0.86 0.90  --   --  0.86  --   --  1.00  CALCIUM 8.7*  --  8.4* 8.3*  --   --  8.4*  --   --  8.6*   < > = values in this interval not displayed.    GFR: Estimated Creatinine Clearance: 64.7 mL/min (by C-G formula based on SCr of 1 mg/dL). Liver Function Tests: Recent Labs  Lab 02/10/22 1029 02/11/22 0535  AST 15 16  ALT 8 10  ALKPHOS 77 58  BILITOT 0.4 0.7  PROT 5.9* 5.3*  ALBUMIN 3.5 2.9*    No results for input(s): "LIPASE", "AMYLASE" in the last 168 hours. No results for input(s): "AMMONIA" in the last 168 hours. Coagulation Profile: No results for input(s): "INR", "PROTIME" in the last 168 hours. Cardiac Enzymes: No results for input(s): "CKTOTAL", "CKMB", "CKMBINDEX", "TROPONINI" in the last 168 hours. BNP (last 3 results) No results for input(s): "PROBNP" in the last 8760 hours. HbA1C: No results for input(s): "HGBA1C" in the last 72 hours. CBG: Recent Labs  Lab 02/10/22 1142  GLUCAP 118*    Lipid Profile: No results for input(s): "CHOL", "HDL", "LDLCALC", "TRIG", "CHOLHDL", "LDLDIRECT" in the last 72 hours. Thyroid Function Tests: Recent Labs    02/11/22 0840  TSH 1.657    Anemia Panel: No results for input(s): "VITAMINB12", "FOLATE", "FERRITIN", "TIBC", "IRON", "RETICCTPCT" in the last 72 hours. Sepsis Labs: No results for input(s): "PROCALCITON", "LATICACIDVEN" in the last 168 hours.  Recent Results (from the past 240 hour(s))  MRSA Next Gen by PCR, Nasal     Status: None   Collection Time: 02/10/22  9:31 PM   Specimen: Nasal  Mucosa; Nasal Swab  Result Value Ref Range Status   MRSA by PCR Next Gen NOT DETECTED NOT DETECTED Final    Comment: (NOTE) The GeneXpert MRSA Assay (FDA approved for NASAL specimens only), is one component of a comprehensive MRSA colonization surveillance program. It is not intended to diagnose MRSA infection nor to guide or monitor treatment for MRSA infections. Test performance is not FDA approved in  patients less than 45 years old. Performed at [facility], [place] [address] [place], [place] [address]          Radiology Studies: No results found.      Scheduled Meds:  Chlorhexidine Gluconate Cloth  6 each Topical Daily   dexamethasone  2 mg Oral Daily   DULoxetine  30 mg Oral Daily   furosemide  10 mg Oral Daily   levETIRAcetam  500 mg Oral BID   levothyroxine  25 mcg Oral QAC breakfast   pantoprazole  40 mg Oral Daily   polyethylene glycol  17 g Oral Daily   sodium chloride  2 g Oral TID WC   Continuous Infusions:  sodium chloride (hypertonic) 25 mL/hr at 02/13/22 0642      LOS: 3 days    Time spent: 35 minutes.     Elmarie Shiley, MD [facility]   If 7PM-7AM, please contact night-coverage [email or web address]  02/13/2022, 7:08 AM

## 2022-02-13 NOTE — Progress Notes (Signed)
[facility] Progress Note  Subjective: Na+ dropped to 126 last night and 3% saline was resumed at 25 cc/hr. This am Na+ is 131.   Vitals:   02/13/22 0538 02/13/22 0700 02/13/22 0800 02/13/22 0852  BP:  (!) 150/68 (!) 168/83   Pulse:  65 80   Resp:  14    Temp:    (!) 97.5 F (36.4 C)  TempSrc:    Oral  SpO2:  97% 95%   Weight: 88.3 kg     Height:        Exam: Gen alert, no distress No jvd or bruits Chest clear bilat to bases RRR no MRG Abd soft ntnd no mass or ascites +bs Ext no LE or UE edema Neuro is alert, nonfocal, pleasant      Home meds include - asa, decadron, colace, cymbalta, percocet prn, synthoid, prilosec, flomax, prns/ vits/ supps    UNa 97, UOsm 456   Assessment/ Plan: Hyponatremia - pt euvolemic on exam, w/ hx of lung cancer and normal urine Na, suspected SIADH.  No signs of CHF / liver disease / renal failure. TSH up a bit and am cortisol pending . 3% saline was given and Na_ improved to 126 then 3% was dc'd. Pt was started on salt tabs, low dose po lasix and 1000 cc fluid restriction. Na+ dropped to 126 last night and 3% was resumed at 25 cc/hr and now serum Na+ is up to 131. 3% saline dc'd this am again. Cont salt tabs (2gm tid) for now w/ low dose po lasix and fluid restriction. Will follow.  Adenocarcinoma of L lung w/ brain mets - under active treatment Hypothyroidism - takes synthroid     Kelly Splinter MD [facility] 02/13/2022, 10:02 AM  Recent Labs  Lab 02/10/22 1029 02/10/22 1922 02/11/22 0535 02/11/22 0747 02/12/22 0620 02/13/22 0255  HGB 9.6*  --  8.5*  --  8.5*  --   ALBUMIN 3.5  --  2.9*  --   --   --   CALCIUM 8.9   < > 8.4*   < > 8.4* 8.6*  CREATININE 0.81   < > 0.86   < > 0.86 1.00  K 3.9   < > 3.9   < > 3.8 4.2   < > = values in this interval not displayed.    No results for input(s): "IRON", "TIBC", "FERRITIN" in the last 168 hours. Inpatient medications:  Chlorhexidine Gluconate Cloth  6 each Topical Daily    dexamethasone  2 mg Oral Daily   DULoxetine  30 mg Oral Daily   furosemide  10 mg Oral Daily   levETIRAcetam  500 mg Oral BID   levothyroxine  25 mcg Oral QAC breakfast   pantoprazole  40 mg Oral Daily   polyethylene glycol  17 g Oral BID   senna-docusate  1 tablet Oral BID   sodium chloride  2 g Oral TID WC     acetaminophen **OR** acetaminophen, bisacodyl, ondansetron **OR** ondansetron (ZOFRAN) IV, mouth rinse

## 2022-02-13 NOTE — Progress Notes (Signed)
Pt arrives via bed from ICU, wife at bedside. Pt is transferred to this units bed with assist x 4. Wife states that pt has not been out of bed for 4 days now. States awaiting physical therapy to see if he can stand. Assessment completed. Pt denies pain, sob. On room air, lungs are dim in bases. Abd distended and slightly firm with active bowel sounds. Pt reports no bm in 4 days as well. Scds are in place. Noted trace edema ble l>r. Pt has some difficulty with memory which wife reports is not new. Side rails are padded. Telemetry box 56 is on, pt is NSR. Oriented to room call bell and bed controls. Call bell in reach. Pt is resting.

## 2022-02-13 NOTE — Evaluation (Signed)
Physical Therapy Evaluation Patient Details Name: Peter Brown MRN: [id number] DOB: [birth year] Today's Date: 02/13/2022  History of Present Illness  Pt admitted from home with seizures 2* hyponatremia in the setting of lung CA with brain mets.  Pt with hx of psoriasis and lumbosacral spondylosis  Clinical Impression  Pt admitted as above and presenting with functional mobility limitations 2* generalized weakness and balance deficits.  Pt OOB for first time in 4 days - pt denies dizziness and with BP as follows:  supine 132/77; sit 120/65; stand 102/55; stand 3 min 106/56 and after ambulating in hall 114/69.  Pt should progress to dc home with family assist and family expressing interest in follow up HHPT to further address deficits and maximize IND and safety.     Recommendations for follow up therapy are one component of a multi-disciplinary discharge planning process, led by the attending physician.  Recommendations may be updated based on patient status, additional functional criteria and insurance authorization.  Follow Up Recommendations Home health PT      Assistance Recommended at Discharge Intermittent Supervision/Assistance  Patient can return home with the following  A little help with walking and/or transfers;A little help with bathing/dressing/bathroom;Assistance with cooking/housework;Assist for transportation;Help with stairs or ramp for entrance    Equipment Recommendations None recommended by PT  Recommendations for Other Services       Functional Status Assessment Patient has had a recent decline in their functional status and demonstrates the ability to make significant improvements in function in a reasonable and predictable amount of time.     Precautions / Restrictions Precautions Precautions: Fall Restrictions Weight Bearing Restrictions: No      Mobility  Bed Mobility Overal bed mobility: Needs Assistance Bed Mobility: Supine to Sit     Supine to sit:  Supervision     General bed mobility comments: increased time with use of bed rail but no physical assist    Transfers Overall transfer level: Needs assistance Equipment used: Rolling walker (2 wheels) Transfers: Sit to/from Stand Sit to Stand: Min guard           General transfer comment: Steady assist with cues for use of UEs to self assist    Ambulation/Gait Ambulation/Gait assistance: Min assist, Min guard Gait Distance (Feet): 200 Feet Assistive device: Rolling walker (2 wheels) Gait Pattern/deviations: Step-to pattern, Step-through pattern, Decreased step length - right, Decreased step length - left, Shuffle, Trunk flexed       General Gait Details: cues for posture and position from [employer]            Wheelchair Mobility    Modified Rankin (Stroke Patients Only)       Balance Overall balance assessment: Needs assistance Sitting-balance support: No upper extremity supported, Feet supported Sitting balance-Leahy Scale: Good     Standing balance support: No upper extremity supported Standing balance-Leahy Scale: Fair                               Pertinent Vitals/Pain Pain Assessment Pain Assessment: No/denies pain    Home Living Family/patient expects to be discharged to:: Private residence Living Arrangements: Spouse/significant other Available Help at Discharge: Family;Available 24 hours/day Type of Home: House Home Access: Stairs to enter   [employer] of Steps: 1   Home Layout: One level Home Equipment: Rollator (4 wheels);Cane - single point;Shower seat      Prior Function Prior Level of Function : Independent/Modified  Independent             Mobility Comments: using rollator at home       Hand Dominance   Dominant Hand: Right    Extremity/Trunk Assessment   Upper Extremity Assessment Upper Extremity Assessment: Overall WFL for tasks assessed    Lower Extremity Assessment Lower Extremity  Assessment: Overall WFL for tasks assessed    Cervical / Trunk Assessment Cervical / Trunk Assessment: Normal  Communication   Communication: No difficulties  Cognition Arousal/Alertness: Awake/alert Behavior During Therapy: WFL for tasks assessed/performed Overall Cognitive Status: History of cognitive impairments - at baseline                                          General Comments      Exercises     Assessment/Plan    PT Assessment Patient needs continued PT services  PT Problem List Decreased strength;Decreased activity tolerance;Decreased balance;Decreased mobility;Decreased knowledge of use of DME       PT Treatment Interventions DME instruction;Gait training;Stair training;Therapeutic activities;Functional mobility training;Therapeutic exercise;Patient/family education    PT Goals (Current goals can be found in the Care Plan section)  Acute Rehab PT Goals Patient Stated Goal: REgain IND PT Goal Formulation: With patient Time For Goal Achievement: 02/27/22 Potential to Achieve Goals: Good    Frequency Min 3X/week     Co-evaluation               AM-PAC PT "6 Clicks" Mobility  Outcome Measure Help needed turning from your back to your side while in a flat bed without using bedrails?: None Help needed moving from lying on your back to sitting on the side of a flat bed without using bedrails?: None Help needed moving to and from a bed to a chair (including a wheelchair)?: A Little Help needed standing up from a chair using your arms (e.g., wheelchair or bedside chair)?: A Little Help needed to walk in hospital room?: A Little Help needed climbing 3-5 steps with a railing? : A Lot 6 Click Score: 19    End of Session Equipment Utilized During Treatment: Gait belt;Oxygen (Spouse reports pt uses 2L O2 at home with activity) Activity Tolerance: Patient tolerated treatment well Patient left: in chair;with call bell/phone within reach;with  chair alarm set;with family/visitor present Nurse Communication: Mobility status PT Visit Diagnosis: Unsteadiness on feet (R26.81);Muscle weakness (generalized) (M62.81);Difficulty in walking, not elsewhere classified (R26.2)    Time: 1324-4010 PT Time Calculation (min) (ACUTE ONLY): 29 min   Charges:   PT Evaluation $PT Eval Low Complexity: 1 Low PT Treatments $Gait Training: 8-22 mins        [place] Pager [phone] Office [phone]   Ronson Hagins 02/13/2022, 4:52 PM

## 2022-02-13 NOTE — Progress Notes (Signed)
Physical therapy present to evaluate and treat. Informed them that pt hadn't been out of bed x4days. Orthostatic bp taken: supine 132/77; sitting 120/65; standing 102/55; 3 minute stationary 106/56. Pt is asymptomatic at all stages. Pt then ambulated using fww for approx 200 feet, back to room pt is into recliner chair after blood pressure check of 114/69. Pt ambulated on 2l [place] as wife also reported that pt wears o2 2l when doing any and all activities. Pt tolerated well. Wife remains at bedside. Call bell in reach.

## 2022-02-13 NOTE — Progress Notes (Signed)
Pt was transferred from ICU. Pt is alert and oriented x 2. Pt's wife at bedside. Physical and 2RN transfer skin assessment was done. Vital signs was taken and recorded. Oriented the pt to the unit and instructed how to use the call bell.

## 2022-02-13 NOTE — Plan of Care (Signed)

## 2022-02-14 DIAGNOSIS — E871 Hypo-osmolality and hyponatremia: Secondary | ICD-10-CM | POA: Diagnosis not present

## 2022-02-14 LAB — BASIC METABOLIC PANEL
Anion gap: 8 (ref 5–15)
BUN: 17 mg/dL (ref 8–23)
CO2: 23 mmol/L (ref 22–32)
Calcium: 8.7 mg/dL — ABNORMAL LOW (ref 8.9–10.3)
Chloride: 99 mmol/L (ref 98–111)
Creatinine, Ser: 0.8 mg/dL (ref 0.61–1.24)
GFR, Estimated: >60 mL/min (ref >60)
Glucose, Bld: 108 mg/dL — ABNORMAL HIGH (ref 70–99)
Potassium: 4.3 mmol/L (ref 3.5–5.1)
Sodium: 130 mmol/L — ABNORMAL LOW (ref 135–145)

## 2022-02-14 LAB — OSMOLALITY, URINE: Osmolality, Ur: 696 mOsm/kg (ref 300–900)

## 2022-02-14 LAB — SODIUM, URINE, RANDOM: Sodium, Ur: 156 mmol/L

## 2022-02-14 MED ORDER — LEVETIRACETAM 500 MG PO TABS: 500.0000 mg | ORAL_TABLET | Freq: Two times a day (BID) | ORAL | 3 refills | Status: DC

## 2022-02-14 MED ORDER — HEPARIN SOD (PORK) LOCK FLUSH 100 UNIT/ML IV SOLN
500.0000 [IU] | INTRAVENOUS | Status: AC | PRN
  Administered 2022-02-14: 500 [IU]

## 2022-02-14 MED ORDER — POLYETHYLENE GLYCOL 3350 17 G PO PACK: 17.0000 g | PACK | Freq: Two times a day (BID) | ORAL | 0 refills | Status: DC

## 2022-02-14 MED ORDER — FLEET ENEMA 7-19 GM/118ML RE ENEM
1.0000 | ENEMA | Freq: Once | RECTAL | Status: AC
  Administered 2022-02-14: 1 via RECTAL
  Filled 2022-02-14: qty 1

## 2022-02-14 MED ORDER — SENNOSIDES-DOCUSATE SODIUM 8.6-50 MG PO TABS: 1.0000 | ORAL_TABLET | Freq: Two times a day (BID) | ORAL | 0 refills | Status: DC

## 2022-02-14 MED ORDER — FUROSEMIDE 20 MG PO TABS: 10.0000 mg | ORAL_TABLET | Freq: Every day | ORAL | 2 refills | Status: DC

## 2022-02-14 MED ORDER — SODIUM CHLORIDE 1 G PO TABS: 2.0000 g | ORAL_TABLET | Freq: Three times a day (TID) | ORAL | 1 refills | Status: DC

## 2022-02-14 NOTE — Plan of Care (Signed)

## 2022-02-14 NOTE — Discharge Instructions (Signed)
Limit your water fluid intake to 1.2 L in 24 hours.   Do not drive, due to recent seizure.

## 2022-02-14 NOTE — Progress Notes (Signed)
[facility] Progress Note  Subjective: Na+ dropped to 126 last night and 3% saline was resumed at 25 cc/hr. This am Na+ is 131.   Vitals:   02/13/22 1400 02/13/22 1510 02/13/22 2011 02/14/22 0500  BP: 133/69 116/65 (!) 143/66 133/64  Pulse: 74 74 78 74  Resp:   18 16  Temp:  97.6 F (36.4 C) 98 F (36.7 C) 98.3 F (36.8 C)  TempSrc:  Oral Oral Oral  SpO2: 92% 97% 96% 98%  Weight:      Height:        Exam: Gen alert, no distress No jvd or bruits Chest clear bilat to bases RRR no MRG Abd soft ntnd no mass or ascites +bs Ext no LE or UE edema Neuro is alert, nonfocal, pleasant      Home meds include - asa, decadron, colace, cymbalta, percocet prn, synthoid, prilosec, flomax, prns/ vits/ supps    UNa 97, UOsm 456   Assessment/ Plan: Hyponatremia - pt euvolemic on exam, w/ hx of lung cancer and normal urine Na, suspected SIADH.  No signs of CHF / liver disease / renal failure. TSH up a bit and am cortisol pending . 3% saline was given and Na_ improved to 126 then 3% was dc'd. Pt was started on salt tabs, low dose po lasix and 1000 cc fluid restriction. Na+ dropped to 126 last night and 3% was resumed at 25 cc/hr then dc'd when Na+ was up to 131. Over last 24 hrs pt's Na+ remained stable in the 129- 131 range and is now OK for dc. Would cont salt tabs, po lasix 10mg  qd and fluid restriction of 1000 cc for next 7-10 days maximum. Have d/w pmd who will contact oncology and/or PCP tomorrow to follow pt's Na+ levels in OP setting.  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    No results for input(s): "IRON", "TIBC", "FERRITIN" in the last 168 hours. Inpatient medications:  Chlorhexidine Gluconate Cloth  6 each Topical Daily   dexamethasone  2 mg Oral Daily   DULoxetine  30 mg Oral Daily   furosemide  10 mg Oral Daily   levETIRAcetam  500 mg Oral BID   levothyroxine  25 mcg Oral QAC breakfast   pantoprazole  40 mg Oral Daily   polyethylene glycol  17 g Oral BID   senna-docusate  1 tablet Oral BID   sodium chloride  2 g Oral TID WC     acetaminophen **OR** acetaminophen, bisacodyl, ondansetron **OR** ondansetron (ZOFRAN) IV, mouth rinse, sodium chloride flush

## 2022-02-14 NOTE — Evaluation (Signed)
Occupational Therapy Evaluation Patient Details Name: Peter Brown MRN: [id number] DOB: 01/29/1940 Today's Date: 02/14/2022   History of Present Illness Pt admitted from home with seizures 2* hyponatremia in the setting of lung CA with brain mets.  Pt with hx of psoriasis and lumbosacral spondylosis   Clinical Impression   Mr. Peter Brown is an 82 year old man with above medical history. On evaluation he demonstrates ability to perform baseline ADLS and he ambulated in the hall with RW with min guard to supervision. He was on RA and his oxygen after ambulation was 95%. 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 Patient has no OT needs. Recommend return home with near 24/7 assist from family.      Recommendations for follow up therapy are one component of a multi-disciplinary discharge planning process, led by the attending physician.  Recommendations may be updated based on patient status, additional functional criteria and insurance authorization.   Follow Up Recommendations  No OT follow up     Assistance Recommended at Discharge Frequent or constant Supervision/Assistance  Patient can return home with the following Assistance with cooking/housework;Direct supervision/assist for medications management;Direct supervision/assist for financial management    Functional Status Assessment  Patient has not had a recent decline in their functional status  Equipment Recommendations  None recommended by OT    Recommendations for Other Services       Precautions / Restrictions Precautions Precautions: Fall Restrictions Weight Bearing Restrictions: No      Mobility Bed Mobility Overal bed mobility: Needs Assistance Bed Mobility: Supine to Sit     Supine to sit: Supervision     General bed mobility comments: increased time with use of bed rail but no physical assist    Transfers Overall transfer level: Needs  assistance Equipment used: Rolling walker (2 wheels) Transfers: Sit to/from Stand             General transfer comment: Min guard to supervision for ambulation with walker. Ambulated on RA - o2 sat 95%      Balance Overall balance assessment: Needs assistance Sitting-balance support: No upper extremity supported, Feet supported Sitting balance-Leahy Scale: Good     Standing balance support: No upper extremity supported Standing balance-Leahy Scale: Fair Standing balance comment: can take hands off of walker for ADLs                           ADL either performed or assessed with clinical judgement   ADL Overall ADL's : At baseline                                       General ADL Comments: Needs supervision and verbal cues but can physically perform.     Vision   Vision Assessment?: No apparent visual deficits     Perception     Praxis      Pertinent Vitals/Pain Pain Assessment Pain Assessment: No/denies pain     Hand Dominance Right   Extremity/Trunk Assessment Upper Extremity Assessment Upper Extremity Assessment: Overall WFL for tasks assessed   Lower Extremity Assessment Lower Extremity Assessment: Overall WFL for tasks assessed   Cervical / Trunk Assessment Cervical / Trunk Assessment: Normal   Communication Communication Communication: No difficulties   Cognition Arousal/Alertness: Awake/alert Behavior During Therapy: WFL for tasks assessed/performed Overall Cognitive Status: History  of cognitive impairments - at baseline                                 General Comments: able to follow instructions     General Comments       Exercises     Shoulder Instructions      Home Living Family/patient expects to be discharged to:: Private residence Living Arrangements: Spouse/significant other Available Help at Discharge: Family;Available 24 hours/day Type of Home: House Home Access: Stairs to  enter [employer] of Steps: 1   Home Layout: One level     Bathroom Shower/Tub: Teacher, early years/pre: Handicapped height     Home Equipment: Rollator (4 wheels);Cane - single point;Shower seat          Prior Functioning/Environment Prior Level of Function : Independent/Modified Independent             Mobility Comments: using rollator at home ADLs Comments: supervision from spouse        OT Problem List:        OT Treatment/Interventions:      OT Goals(Current goals can be found in the care plan section) Acute Rehab OT Goals OT Goal Formulation: All assessment and education complete, DC therapy  OT Frequency:      Co-evaluation              AM-PAC OT "6 Clicks" Daily Activity     Outcome Measure Help from another person eating meals?: None Help from another person taking care of personal grooming?: None Help from another person toileting, which includes using toliet, bedpan, or urinal?: None Help from another person bathing (including washing, rinsing, drying)?: None Help from another person to put on and taking off regular upper body clothing?: None Help from another person to put on and taking off regular lower body clothing?: None 6 Click Score: 24   End of Session Equipment Utilized During Treatment: Rolling walker (2 wheels) Nurse Communication: Mobility status  Activity Tolerance: Patient tolerated treatment well Patient left: in chair;with family/visitor present  OT Visit Diagnosis: Muscle weakness (generalized) (M62.81)                Time: 2637-8588 OT Time Calculation (min): 16 min Charges:  OT General Charges $OT Visit: 1 Visit OT Evaluation $OT Eval Low Complexity: 1 Low  Gustavo Lah, OTR/L [place]  Office [phone]   Lenward Chancellor 02/14/2022, 12:34 PM

## 2022-02-14 NOTE — Discharge Summary (Signed)
Physician Discharge Summary   Patient: Peter Brown MRN: [id number] DOB: 03-29-1940  Admit date:     02/10/2022  Discharge date: 02/14/22  Discharge Physician: Elmarie Shiley   PCP: [name], MD   Recommendations at discharge:   Needs close follow up Oncology/PCP to monitor sodium level.   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 *  Hospital Course: 82 year old with past medical history significant for Barret esophagus, hyperlipidemia, psoriasis, precancerous melanosis, lumbosacral spondylosis, vitamin D deficiency, lung cancer metastasis to brain who was supposed to have immunotherapy on 2/7 at cancer center but was sent to the ED because patient was noted to have absence seizure lasting about 8 seconds and severe hyponatremia with sodium of 118.  Patient was confused on admission.   CT head without contrast showed treated metastatic lesion in the left anterior temporal lobe.  There appears to be less edema and mass effect.  MRI of the brain with no evidence of new or progressive metastatic disease.    Assessment and Plan: 1-Severe hyponatremia: Presented with absence Seizure.  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  Urine osmolality 456 Treated initially with  Hypertonic saline, sodium increase to 126. Plan to stop hypertonic saline and start sodium tablet and oral lasix.  Nephrology consulted.  Continue to monitor Sodium level every 6 hours.  TSH: 1.6, cortisol low but he is already  He was started on hypertonic last night for sodium down to 126. Stop hypertonic. Sodium 131.  Continue with lasix and sodium tablet.  Sodium has been stable. Plan to discharge home.   Absence  seizure: In setting of Hyponatremia.  Discussed with Dr Sherrilee Gilles patient at risk for seizure from temporal lesion, ok to continue with Keppra.      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  Follow up outpatient.  Continue with Keppra for seizure prophylaxis.  On Decadron.    Esophagus without dysplasia Continue wit PPI>    Hyperlipidemia Management per PCP./    Normocytic anemia Monitor hb.    Hypothyroidism: TSH. 1.6 Continue with Synthroid.    Constipation; continue with  miralax, dulcolax. Had BM after enema.  Depression; Continue with  Cymbalta             Consultants: Nephrology  Procedures performed: None Disposition: Home Diet recommendation:  Discharge Diet Orders (From admission, onward)     Start     Ordered   02/14/22 0000  Diet - low sodium heart healthy        02/14/22 1500           Regular diet DISCHARGE MEDICATION: Allergies as of 02/14/2022       Reactions   Albuterol Sulfate Other (See Comments)   The inhaler caused chest and back pain   Tamsulosin Other (See Comments)   Caused dizziness/weakness- patient STOPPED taking this        Medication List     STOP taking these medications    tamsulosin 0.4 MG Caps capsule Commonly known as: FLOMAX       TAKE these medications    aspirin EC 81 MG tablet Take 81 mg by mouth in the morning.   bisacodyl 5 MG  EC tablet Commonly known as: DULCOLAX Take 5 mg by mouth daily as needed for moderate constipation.   coal tar-salicylic acid 2 % shampoo Apply 1 application  topically every 7 (seven) days.   D3 PO Take 1 capsule by mouth in the morning.   dexamethasone 1 MG tablet Commonly known as: DECADRON Take 2 mg by mouth daily with breakfast.   docusate sodium 100 MG capsule Commonly known as: COLACE Take 100 mg by mouth 3 (three) times daily as needed for mild constipation.   DULoxetine 30 MG capsule Commonly known as: CYMBALTA Take 30 mg  by mouth in the morning.   fexofenadine 180 MG tablet Commonly known as: ALLEGRA Take 180 mg by mouth daily as needed for allergies.   fluocinonide cream 0.05 % Commonly known as: LIDEX Apply 1 application  topically 2 (two) times daily as needed (for psoriasis).   folic acid 1 MG tablet Commonly known as: FOLVITE TAKE 1 TABLET(1 MG) BY MOUTH DAILY What changed: See the new instructions.   furosemide 20 MG tablet Commonly known as: LASIX Take 0.5 tablets (10 mg total) by mouth daily. Start taking on: February 15, 2022   hydrocortisone cream 1 % Apply 1 application topically 2 (two) times daily. What changed:  when to take this reasons to take this   ketoconazole 2 % cream Commonly known as: NIZORAL Apply 1 application  topically daily as needed (rash).   levETIRAcetam 500 MG tablet Commonly known as: KEPPRA Take 1 tablet (500 mg total) by mouth 2 (two) times daily.   levothyroxine 25 MCG tablet Commonly known as: SYNTHROID TAKE 1 TABLET(25 MCG) BY MOUTH DAILY BEFORE BREAKFAST What changed: See the new instructions.   lidocaine-prilocaine cream Commonly known as: EMLA Apply 1 application topically as needed. What changed:  when to take this reasons to take this   Magnesium 500 MG Tabs Take 500 mg by mouth at bedtime.   Melatonin 12 MG Tabs Take 12 mg by mouth at bedtime.   Omega-3 500 MG Caps Take 500 mg by mouth in the morning.   omeprazole 40 MG capsule Commonly known as: PRILOSEC Take 40 mg by mouth in the morning.   oxyCODONE-acetaminophen 5-325 MG tablet Commonly known as: PERCOCET/ROXICET Take 1 tablet by mouth every 8 (eight) hours as needed for severe pain.   polyethylene glycol 17 g packet Commonly known as: MIRALAX / GLYCOLAX Take 17 g by mouth 2 (two) times daily.   Probiotic 1-250 BILLION-MG Caps Take 1 capsule by mouth in the morning.   senna-docusate 8.6-50 MG tablet Commonly known as: Senokot-S Take 1 tablet by mouth 2 (two) times  daily.   sodium chloride 1 g tablet Take 2 tablets (2 g total) by mouth 3 (three) times daily with meals.        Follow-up Information     [name], MD Follow up in 1 week(s).   Specialty: Internal Medicine Why: You need close follow up with PCP for sodium level. Contact information: [place] [address] [phone]                Discharge Exam: Filed Weights   02/12/22 0500 02/12/22 2225 02/13/22 0538  Weight: 87.7 kg 87.7 kg 88.3 kg   General; NAD  Condition at discharge: stable  The results of significant diagnostics from this hospitalization (including imaging, microbiology, ancillary and laboratory) are listed below for reference.   Imaging Studies: MR Brain W and Wo Contrast  Result Date: 02/10/2022 CLINICAL  DATA:  Metastatic disease.  Assess treatment response. EXAM: MRI HEAD WITHOUT AND WITH CONTRAST TECHNIQUE: Multiplanar, multiecho pulse sequences of the brain and surrounding structures were obtained without and with intravenous contrast. CONTRAST:  88mL GADAVIST GADOBUTROL 1 MMOL/ML IV SOLN COMPARISON:  CT same day.  MRI 12/03/2021. 07/29/2021. FINDINGS: Brain: Diffusion imaging does not show any acute or subacute infarction or other cause of restricted diffusion. Posterior fossa appears normal. No residual finding related to the treated vermian metastatic lesion. Right cerebral hemisphere appears normal. No residual finding relating to the previously treated medial right occipital metastasis. Treated left temporal lesion continues to contract, axial diameter 17 x 18 mm compared with 22 x 25 mm in November. No new or progressive finding. Reduction in T2 FLAIR signal within the left temporal lobe. No new lesion seen in the left hemisphere. No hydrocephalus.  No extra-axial collection. Vascular: Major vessels at the base of the brain show flow. Skull and upper cervical spine: Negative Sinuses/Orbits: Clear/normal Other: None IMPRESSION: 1. No  evidence of new or progressive metastatic disease. 2. Treated left temporal lesion continues to contract, axial diameter 17 x 18 mm compared with 22 x 25 mm in November. Reduction in T2 FLAIR signal within the left temporal lobe. 3. No residual finding related to the treated right occipital metastasis. 4. No residual finding related to the treated vermian metastasis. Electronically Signed   By: Nelson Chimes M.D.   On: 02/10/2022 15:11   CT Head Wo Contrast  Result Date: 02/10/2022 CLINICAL DATA:  Sinusitis. Acute. Orbital or intracranial complication suspected. Seizure. History of lung cancer. EXAM: CT HEAD WITHOUT CONTRAST TECHNIQUE: Contiguous axial images were obtained from the base of the skull through the vertex without intravenous contrast. RADIATION DOSE REDUCTION: This exam was performed according to the departmental dose-optimization program which includes automated exposure control, adjustment of the mA and/or kV according to patient size and/or use of iterative reconstruction technique. COMPARISON:  05/01/2021.  12/04/2021 MRI. FINDINGS: Brain: No CT abnormality visible in the brainstem or cerebellum. Indistinct low-density in the left anterior temporal lobe relates to a treated lesion in that region. There appears to be less mass effect and edema than was seen on recent prior imaging, but this could certainly be a seizure focus. Cerebral hemispheres otherwise show atrophy and chronic small-vessel ischemic change. Other small treated lesions are not visible. No hydrocephalus or extra-axial collection. MRI of the brain with contrast would be appropriate if detailed evaluation of the metastatic disease is desired. Vascular: There is atherosclerotic calcification of the major vessels at the base of the brain. Skull: Negative Sinuses/Orbits: Sinuses are clear.  Orbits are normal. Other: Mastoids are clear. IMPRESSION: Indistinct low-density in the left anterior temporal lobe relates to a treated metastatic  lesion in that region. There appears to be less mass effect and edema than was seen on recent prior imaging, but this could certainly be a seizure focus. None of the other previously seen treated lesions are discernible by CT. MRI of the brain with contrast would be appropriate if detailed evaluation of the metastatic disease is desired. Electronically Signed   By: Nelson Chimes M.D.   On: 02/10/2022 13:08    Microbiology: Results for orders placed or performed during the hospital encounter of 02/10/22  MRSA Next Gen by PCR, Nasal     Status: None   Collection Time: 02/10/22  9:31 PM   Specimen: Nasal Mucosa; Nasal Swab  Result Value Ref Range Status   MRSA by PCR Next  Gen NOT DETECTED NOT DETECTED Final    Comment: (NOTE) The GeneXpert MRSA Assay (FDA approved for NASAL specimens only), is one component of a comprehensive MRSA colonization surveillance program. It is not intended to diagnose MRSA infection nor to guide or monitor treatment for MRSA infections. Test performance is not FDA approved in patients less than 56 years old. Performed at [facility], [address] [place], [place] [address]     Labs: CBC: Recent Labs  Lab 02/10/22 1029 02/11/22 0535 02/12/22 0620  WBC 6.9 5.4 5.9  NEUTROABS 4.7  --   --   HGB 9.6* 8.5* 8.5*  HCT 27.3* 24.7* 25.7*  MCV 89.2 91.1 93.8  PLT 256 183 867   Basic Metabolic Panel: Recent Labs  Lab 02/11/22 0535 02/11/22 0747 02/11/22 1212 02/12/22 0620 02/12/22 1011 02/13/22 0255 02/13/22 0821 02/13/22 1034 02/13/22 1432 02/14/22 0250  NA 122* 123*   < > 126*   < > 131* 133* 132* 131* 130*  K 3.9 3.5  --  3.8  --  4.2  --   --   --  4.3  CL 91* 95*  --  99  --  97*  --   --   --  99  CO2 22 21*  --  23  --  25  --   --   --  23  GLUCOSE 83 115*  --  85  --  107*  --   --   --  108*  BUN 11 11  --  13  --  15  --   --   --  17  CREATININE 0.86 0.90  --  0.86  --  1.00  --   --   --  0.80  CALCIUM 8.4* 8.3*   --  8.4*  --  8.6*  --   --   --  8.7*   < > = values in this interval not displayed.   Liver Function Tests: Recent Labs  Lab 02/10/22 1029 02/11/22 0535  AST 15 16  ALT 8 10  ALKPHOS 77 58  BILITOT 0.4 0.7  PROT 5.9* 5.3*  ALBUMIN 3.5 2.9*   CBG: Recent Labs  Lab 02/10/22 1142  GLUCAP 118*    Discharge time spent: greater than 30 minutes.  Signed: Elmarie Shiley, MD [facility] 02/14/2022

## 2022-02-14 NOTE — Progress Notes (Signed)
DC orders recd. IV and tele removed per protocol. Pt tol well. DC paperwork reviewed with pt and family who voice understanding of follow up responsibilities and medication regime. Pt voices no concerns or c/o. No distress noted. Pt by wheelchair to main entrance to return home via pov. All belongings accompany pt.

## 2022-02-15 ENCOUNTER — Telehealth: Payer: Self-pay

## 2022-02-15 ENCOUNTER — Inpatient Hospital Stay: Payer: Medicare HMO

## 2022-02-15 NOTE — Telephone Encounter (Signed)
This RNCM spoke with Malachy Mood with [facility] who will provide HHPT/OT. Patient was discharged over the weekend. Patient and family accepts any [place] agency that will accept the patient's insurance.   No additional TOC needs at this time.

## 2022-02-16 ENCOUNTER — Other Ambulatory Visit: Payer: Self-pay

## 2022-02-18 ENCOUNTER — Other Ambulatory Visit: Payer: Self-pay

## 2022-02-18 ENCOUNTER — Inpatient Hospital Stay: Payer: Medicare HMO | Admitting: Internal Medicine

## 2022-02-18 VITALS — BP 119/75 | HR 86 | Temp 97.7°F | Resp 18 | Wt 179.6 lb

## 2022-02-18 DIAGNOSIS — R5383 Other fatigue: Secondary | ICD-10-CM | POA: Diagnosis not present

## 2022-02-18 DIAGNOSIS — C3492 Malignant neoplasm of unspecified part of left bronchus or lung: Secondary | ICD-10-CM | POA: Diagnosis not present

## 2022-02-18 DIAGNOSIS — R569 Unspecified convulsions: Secondary | ICD-10-CM | POA: Diagnosis not present

## 2022-02-18 DIAGNOSIS — C7931 Secondary malignant neoplasm of brain: Secondary | ICD-10-CM

## 2022-02-18 DIAGNOSIS — Z79899 Other long term (current) drug therapy: Secondary | ICD-10-CM | POA: Diagnosis not present

## 2022-02-18 DIAGNOSIS — J910 Malignant pleural effusion: Secondary | ICD-10-CM | POA: Diagnosis not present

## 2022-02-18 DIAGNOSIS — C3412 Malignant neoplasm of upper lobe, left bronchus or lung: Secondary | ICD-10-CM | POA: Diagnosis not present

## 2022-02-18 DIAGNOSIS — C782 Secondary malignant neoplasm of pleura: Secondary | ICD-10-CM | POA: Diagnosis not present

## 2022-02-18 MED ORDER — DEXAMETHASONE 1 MG PO TABS: 1.0000 mg | ORAL_TABLET | Freq: Every day | ORAL | 0 refills | Status: DC

## 2022-02-18 NOTE — Progress Notes (Signed)
[place] at [address], [place] [address] [phone]   Interval Evaluation  Date of Service: 02/18/22 Patient Name: Peter Brown Patient MRN: [id number] Patient DOB: Nov 17, 1940 Provider: Ventura Sellers, MD  Identifying Statement:  Peter Brown is a 82 y.o. male with Adenocarcinoma, lung, left ([place]) - Plan: Ambulatory Referral to [facility] Nutrition, MR BRAIN W WO CONTRAST  Malignant neoplasm metastatic to brain [facility])  Focal seizures ([place])   Primary Cancer:  Oncologic History: Oncology History  Adenocarcinoma, lung, left ([place])  08/04/2020 Initial Diagnosis   Adenocarcinoma of left lung, stage 4 ([place])   08/04/2020 Cancer Staging   Staging form: Lung, AJCC 8th Edition - Clinical: Stage IVA (cT1b, cN2, cM1a) - Signed by Curt Bears, MD on 08/04/2020   08/13/2020 -  Chemotherapy   Patient is on Treatment Plan : LUNG Carboplatin (5) + Pemetrexed (500) + Pembrolizumab (200) D1 q21d Induction x 4 cycles / Maintenance Pemetrexed (500) + Pembrolizumab (200) D1 q21d      CNS Oncologic History 08/25/20: SRS to L temporal metastasis [name]) 08/14/21: SRSx2 salvage [name])  Interval History: Peter Brown presents for follow up today after recent MRI brain.  No further seizures since hospitalization for seizures, hyponatremia last week.  He complains of significant fatigue since the hospital course, sleeping up to 16 hours per day.  Taking salt tabs, Keppra 500mg  BID, and decadron 2mg  daily. Walking is very slow right now.  He otherwise remains mostly independent with function.    H+P (05/12/21) Patient presented to neurologic attention last week with new onset confusion.  His wife describes him forgetting names, choosing the wrong word, answering simple questions incorrectly.  Onset was over several days.  No seizure activity was witnessed.  No issues with his gait, no right sided weakness.  He denies headaches.  Decadron was started on 5/1 by  rad onc, 4mg  twice per day.  He describes significant improvement, very close to his prior baseline, over the past 8 days.  He mowed his lawn this weekend, is otherwise independent with activities of living.  On Pem/Pem with Dr. Julien Nordmann, doing well.  Medications: Current Outpatient Medications on File Prior to Visit  Medication Sig Dispense Refill   aspirin 81 MG EC tablet Take 81 mg by mouth in the morning.     Bacillus Coagulans-Inulin (PROBIOTIC) 1-250 BILLION-MG CAPS Take 1 capsule by mouth in the morning.     bisacodyl (DULCOLAX) 5 MG EC tablet Take 5 mg by mouth daily as needed for moderate constipation.     Cholecalciferol (D3 PO) Take 1 capsule by mouth in the morning.     coal tar-salicylic acid 2 % shampoo Apply 1 application  topically every 7 (seven) days.     docusate sodium (COLACE) 100 MG capsule Take 100 mg by mouth 3 (three) times daily as needed for mild constipation.     DULoxetine (CYMBALTA) 30 MG capsule Take 30 mg by mouth in the morning.     fexofenadine (ALLEGRA) 180 MG tablet Take 180 mg by mouth daily as needed for allergies.     fluocinonide cream (LIDEX) 0.93 % Apply 1 application  topically 2 (two) times daily as needed (for psoriasis).     folic acid (FOLVITE) 1 MG tablet TAKE 1 TABLET(1 MG) BY MOUTH DAILY (Patient taking differently: Take 1 mg by mouth in the morning.) 30 tablet 4   furosemide (LASIX) 20 MG tablet Take 0.5 tablets (10 mg total) by mouth  daily. 30 tablet 2   hydrocortisone cream 1 % Apply 1 application topically 2 (two) times daily. (Patient taking differently: Apply 1 application  topically 2 (two) times daily as needed (for rashes).) 453.6 g 0   ketoconazole (NIZORAL) 2 % cream Apply 1 application  topically daily as needed (rash).     Krill Oil (OMEGA-3) 500 MG CAPS Take 500 mg by mouth in the morning.     levETIRAcetam (KEPPRA) 500 MG tablet Take 1 tablet (500 mg total) by mouth 2 (two) times daily. 60 tablet 3   levothyroxine (SYNTHROID) 25 MCG  tablet TAKE 1 TABLET(25 MCG) BY MOUTH DAILY BEFORE BREAKFAST (Patient taking differently: Take 25 mcg by mouth daily before breakfast.) 30 tablet 2   lidocaine-prilocaine (EMLA) cream Apply 1 application topically as needed. (Patient taking differently: Apply 1 application  topically daily as needed (access port).) 30 g 1   Magnesium 500 MG TABS Take 500 mg by mouth at bedtime.     Melatonin 12 MG TABS Take 12 mg by mouth at bedtime.     omeprazole (PRILOSEC) 40 MG capsule Take 40 mg by mouth in the morning.     oxyCODONE-acetaminophen (PERCOCET/ROXICET) 5-325 MG tablet Take 1 tablet by mouth every 8 (eight) hours as needed for severe pain. 30 tablet 0   polyethylene glycol (MIRALAX / GLYCOLAX) 17 g packet Take 17 g by mouth 2 (two) times daily. 14 each 0   senna-docusate (SENOKOT-S) 8.6-50 MG tablet Take 1 tablet by mouth 2 (two) times daily. 30 tablet 0   sodium chloride 1 g tablet Take 2 tablets (2 g total) by mouth 3 (three) times daily with meals. 90 tablet 1   No current facility-administered medications on file prior to visit.    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 ICD10 Conversion   Past Surgical History:  Past Surgical History:  Procedure Laterality Date   IR IMAGING GUIDED PORT INSERTION  11/24/2020   KNEE SURGERY     orthoscopic     TONSILLECTOMY AND ADENOIDECTOMY     Social  History:  Social History   Socioeconomic History   Marital status: Married    Spouse name: Not on file   Number of children: Not on file   Years of education: Not on file   Highest education level: Not on file  Occupational History   Not on file  Tobacco Use   Smoking status: Never   Smokeless tobacco: Never  Substance and Sexual Activity   Alcohol use: Never   Drug use: Never   Sexual activity: Not on file  Other Topics Concern   Not on file  Social History Narrative   Not on file   Social Determinants of Health   Financial Resource Strain: Low Risk  (08/22/2020)   Overall Financial Resource Strain (CARDIA)    Difficulty of Paying Living Expenses: Not very hard  Food Insecurity: No Food Insecurity (02/11/2022)   Hunger Vital Sign    Worried About Running Out of Food in the Last Year: Never true    Ran Out of Food in the Last Year: Never true  Transportation Needs: No Transportation Needs (02/11/2022)   PRAPARE - Hydrologist (Medical): No    Lack of Transportation (Non-Medical): No  Physical Activity: Not on file  Stress: Stress Concern Present (08/22/2020)   [place]    Feeling of Stress : To some extent  Social Connections: Socially Integrated (08/22/2020)   Social Connection and Isolation Panel [NHANES]    Frequency of Communication with Friends and Family: More than three times a week    Frequency of Social Gatherings with Friends and Family: Once a week    Attends Religious Services: More than 4 times per year    Active Member of [employer] or Organizations: Yes    Attends Archivist Meetings: More than 4 times per year    Marital Status: Married  Human resources officer Violence: Not At Risk (02/11/2022)   Humiliation, Afraid, Rape, and Kick questionnaire    Fear of Current or Ex-Partner: No    Emotionally Abused: No    Physically Abused: No    Sexually Abused: No    Family History: No family history on file.  Review of Systems: Constitutional: Doesn't report fevers, chills or abnormal weight loss Eyes: Doesn't report blurriness of vision Ears, nose, mouth, throat, and face: Doesn't report sore throat Respiratory: Doesn't report cough, dyspnea or wheezes Cardiovascular: Doesn't report palpitation, chest discomfort  Gastrointestinal:  Doesn't report nausea, constipation, diarrhea GU: Doesn't report incontinence Skin: Doesn't report skin rashes Neurological: Per HPI Musculoskeletal: Doesn't report joint pain Behavioral/Psych: Doesn't report anxiety  Physical Exam: Wt Readings from Last 3 Encounters:  02/18/22 179 lb 9.6 oz (81.5 kg)  02/13/22 194 lb 10.7 oz (88.3 kg)  02/10/22 191 lb 12.8 oz (87 kg)   Temp Readings from Last 3 Encounters:  02/18/22 97.7 F (36.5 C)  02/14/22 (!) 97.4 F (36.3 C) (Oral)  02/10/22 97.6 F (36.4 C) (Oral)   BP Readings from Last 3 Encounters:  02/18/22 119/75  02/14/22 116/72  02/10/22 121/65   Pulse Readings from Last 3 Encounters:  02/18/22 86  02/14/22 70  02/10/22 73    KPS: 80. General: Alert, cooperative, pleasant, in no acute distress Head: Normal EENT: No conjunctival injection or scleral icterus.  Lungs: Resp effort normal Cardiac: Regular rate Abdomen: Non-distended abdomen Skin: No rashes cyanosis or petechiae. Extremities: No clubbing or edema  Neurologic Exam: Mental Status: Awake, alert, attentive to examiner. Oriented to self and environment. Language is notable for mildly impaired fluency.  Impaired short term memory, recall. Cranial Nerves: Visual acuity is grossly normal. Visual fields are full. Extra-ocular movements intact. No ptosis. Face is symmetric Motor: Tone and bulk are normal. Power is full in both arms and legs. Reflexes are symmetric, no pathologic reflexes present.  Sensory: Intact to light touch Gait: Normal.   Labs: I have reviewed the data as listed     Component Value Date/Time   NA 130 (L) 02/14/2022 0250   K 4.3 02/14/2022 0250   CL 99 02/14/2022 0250   CO2 23 02/14/2022 0250   GLUCOSE 108 (H) 02/14/2022 0250   BUN 17 02/14/2022 0250   CREATININE 0.80 02/14/2022 0250   CREATININE 0.81 02/10/2022 1029   CALCIUM 8.7 (L) 02/14/2022 0250  PROT 5.3 (L) 02/11/2022 0535   ALBUMIN 2.9 (L) 02/11/2022 0535   AST 16 02/11/2022 0535   AST 15 02/10/2022 1029   ALT 10 02/11/2022 0535   ALT 8 02/10/2022 1029   ALKPHOS 58 02/11/2022 0535   BILITOT 0.7 02/11/2022 0535   BILITOT 0.4 02/10/2022 1029   GFRNONAA >60 02/14/2022 0250   GFRNONAA >60 02/10/2022 1029   Lab Results  Component Value Date   WBC 5.9 02/12/2022   NEUTROABS 4.7 02/10/2022   HGB 8.5 (L) 02/12/2022   HCT 25.7 (L) 02/12/2022   MCV 93.8 02/12/2022   PLT 188 02/12/2022    Imaging:  MR Brain W and Wo Contrast  Result Date: 02/10/2022 CLINICAL DATA:  Metastatic disease.  Assess treatment response. EXAM: MRI HEAD WITHOUT AND WITH CONTRAST TECHNIQUE: Multiplanar, multiecho pulse sequences of the brain and surrounding structures were obtained without and with intravenous contrast. CONTRAST:  61mL GADAVIST GADOBUTROL 1 MMOL/ML IV SOLN COMPARISON:  CT same day.  MRI 12/03/2021. 07/29/2021. FINDINGS: Brain: Diffusion imaging does not show any acute or subacute infarction or other cause of restricted diffusion. Posterior fossa appears normal. No residual finding related to the treated vermian metastatic lesion. Right cerebral hemisphere appears normal. No residual finding relating to the previously treated medial right occipital metastasis. Treated left temporal lesion continues to contract, axial diameter 17 x 18 mm compared with 22 x 25 mm in November. No new or progressive finding. Reduction in T2 FLAIR signal within the left temporal lobe. No new lesion seen in the left hemisphere. No hydrocephalus.  No extra-axial collection. Vascular: Major vessels at the base of the brain show  flow. Skull and upper cervical spine: Negative Sinuses/Orbits: Clear/normal Other: None IMPRESSION: 1. No evidence of new or progressive metastatic disease. 2. Treated left temporal lesion continues to contract, axial diameter 17 x 18 mm compared with 22 x 25 mm in November. Reduction in T2 FLAIR signal within the left temporal lobe. 3. No residual finding related to the treated right occipital metastasis. 4. No residual finding related to the treated vermian metastasis. Electronically Signed   By: Nelson Chimes M.D.   On: 02/10/2022 15:11   CT Head Wo Contrast  Result Date: 02/10/2022 CLINICAL DATA:  Sinusitis. Acute. Orbital or intracranial complication suspected. Seizure. History of lung cancer. EXAM: CT HEAD WITHOUT CONTRAST TECHNIQUE: Contiguous axial images were obtained from the base of the skull through the vertex without intravenous contrast. RADIATION DOSE REDUCTION: This exam was performed according to the departmental dose-optimization program which includes automated exposure control, adjustment of the mA and/or kV according to patient size and/or use of iterative reconstruction technique. COMPARISON:  05/01/2021.  12/04/2021 MRI. FINDINGS: Brain: No CT abnormality visible in the brainstem or cerebellum. Indistinct low-density in the left anterior temporal lobe relates to a treated lesion in that region. There appears to be less mass effect and edema than was seen on recent prior imaging, but this could certainly be a seizure focus. Cerebral hemispheres otherwise show atrophy and chronic small-vessel ischemic change. Other small treated lesions are not visible. No hydrocephalus or extra-axial collection. MRI of the brain with contrast would be appropriate if detailed evaluation of the metastatic disease is desired. Vascular: There is atherosclerotic calcification of the major vessels at the base of the brain. Skull: Negative Sinuses/Orbits: Sinuses are clear.  Orbits are normal. Other: Mastoids are  clear. IMPRESSION: Indistinct low-density in the left anterior temporal lobe relates to a treated metastatic lesion in that region. There appears  to be less mass effect and edema than was seen on recent prior imaging, but this could certainly be a seizure focus. None of the other previously seen treated lesions are discernible by CT. MRI of the brain with contrast would be appropriate if detailed evaluation of the metastatic disease is desired. Electronically Signed   By: Nelson Chimes M.D.   On: 02/10/2022 13:08    [place] Clinician Interpretation: I have personally reviewed the radiological images as listed.  My interpretation, in the context of the patient's clinical presentation, is stable disease   Assessment/Plan Adenocarcinoma, lung, left ([place]) - Plan: Ambulatory Referral to [facility] Nutrition, MR BRAIN W WO CONTRAST  Malignant neoplasm metastatic to brain [facility])  Focal seizures ([place])  Sani Madariaga is clinically stable today from focal neurologic standpoint.  He has some metabolic encephalopathy and deconditioning from prolonged severe hyponatremia, seizures.    MRI brain demonstrates stability of previously progressive left temporal metastasis.  Edema is clearly improved. Other recently treated sites are improved or stable.   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  We recommended decreasing decadron to 1mg  daily x7 days, then stopping if tolerated.  We will touch base with him via phone after the taper to continue to address his fatigue and lethargy symptoms.  Keppra may need to be adjusted as well.  We appreciate the opportunity to participate in the care of Peighton Edgin.   We ask that Deandrae Wajda return to clinic in 3 months following next brain MRI, or sooner as needed.  All questions were answered. The patient knows to call the clinic with any problems, questions or concerns. No barriers to learning were detected.  The total time  spent in the encounter was 40 minutes and more than 50% was on counseling and review of test results   Ventura Sellers, MD Medical Director of Neuro-Oncology [facility] [place] 02/18/22 4:05 PM

## 2022-02-19 ENCOUNTER — Inpatient Hospital Stay: Admission: RE | Admit: 2022-02-19 | Payer: Medicare HMO | Source: Ambulatory Visit

## 2022-02-23 ENCOUNTER — Other Ambulatory Visit: Payer: Self-pay | Admitting: Radiation Therapy

## 2022-02-23 ENCOUNTER — Telehealth: Payer: Self-pay | Admitting: Internal Medicine

## 2022-02-23 NOTE — Telephone Encounter (Signed)
Called patient regarding all upcoming appointments, patient is notified.

## 2022-02-24 ENCOUNTER — Other Ambulatory Visit: Payer: Self-pay

## 2022-02-26 ENCOUNTER — Encounter: Payer: Self-pay | Admitting: Internal Medicine

## 2022-03-01 ENCOUNTER — Encounter (HOSPITAL_COMMUNITY): Payer: Self-pay

## 2022-03-01 ENCOUNTER — Other Ambulatory Visit: Payer: Self-pay | Admitting: Internal Medicine

## 2022-03-01 ENCOUNTER — Ambulatory Visit (HOSPITAL_COMMUNITY)
Admission: RE | Admit: 2022-03-01 | Discharge: 2022-03-01 | Disposition: A | Discharge status: Home / Self Care | Payer: Medicare HMO | Source: Ambulatory Visit | Attending: Internal Medicine | Admitting: Internal Medicine

## 2022-03-01 DIAGNOSIS — C3490 Malignant neoplasm of unspecified part of unspecified bronchus or lung: Secondary | ICD-10-CM | POA: Diagnosis present

## 2022-03-01 MED ORDER — HEPARIN SOD (PORK) LOCK FLUSH 100 UNIT/ML IV SOLN
500.0000 [IU] | Freq: Once | INTRAVENOUS | Status: AC
  Administered 2022-03-01: 500 [IU] via INTRAVENOUS

## 2022-03-01 MED ORDER — IOHEXOL 300 MG/ML  SOLN
100.0000 mL | Freq: Once | INTRAMUSCULAR | Status: AC | PRN
  Administered 2022-03-01: 100 mL via INTRAVENOUS

## 2022-03-01 MED ORDER — SODIUM CHLORIDE (PF) 0.9 % IJ SOLN
INTRAMUSCULAR | Status: AC
  Filled 2022-03-01: qty 50

## 2022-03-01 MED ORDER — HEPARIN SOD (PORK) LOCK FLUSH 100 UNIT/ML IV SOLN
INTRAVENOUS | Status: AC
  Filled 2022-03-01: qty 5

## 2022-03-01 NOTE — Progress Notes (Unsigned)
[place] OFFICE PROGRESS NOTE  Peter Hong, MD [address] [place] [address]  DIAGNOSIS: Stage IV (T1b, N2, M1a) non-small cell lung cancer, adenocarcinoma diagnosed in July 2022 and presented with left upper lobe nodule in addition to AP window lymphadenopathy and left-sided malignant pleural effusion as well as pleural metastatic disease.  The patient also has solitary left temporal brain metastasis.   Biomarker Findings Microsatellite status - MS-Stable Tumor Mutational Burden - 4 Muts/Mb Genomic Findings For a complete list of the genes assayed, please refer to the Appendix. [id number] PTEN splice site [id number] - subclonal? DOT1L S911L - subclonal? RAD21 S271* RB1 loss exons 3-23 TP53 N328f*34 8 Disease relevant genes with no reportable alterations: ALK, BRAF, EGFR, ERBB2, KRAS, MET, RET, ROS1    PRIOR THERAPY:  1)  SRS to the brain completed on 09/03/2020 2) [facility]. Completed in June 2023 3) 1) Systemic chemotherapy with carboplatin for AUC of 5, Alimta 500 Mg/M2 and Keytruda 200 Mg IV every 3 weeks.  First dose August 13, 2020.  Status post 24 cycles.  Starting from cycle #5 he is on maintenance treatment with Alimta and Keytruda every 3 weeks.  Discontinued due to evidence of disease progression  CURRENT THERAPY: Hospice    INTERVAL HISTORY: Peter Schoessow82y.o. male 82y.o. returns to the clinic today for a follow-up visit accompanied by his wife.  The patient was last seen by Dr. MJulien Nordmann3 weeks ago.  At that point in time, the patient had what was felt to be consistent with seizure in the setting of severe hyponatremia on labs.  He was subsequently sent to the ER. The patient was confused upon admission.  The patient repeat CT of the head which showed treated metastatic brain lesion with less edema and less mass effect.  He was hospitalized until  81/11/2022. 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  He was discharged on salt tablets which he takes 1 tablet BID (this was reduced from 3 tablets TID last week due to repeat labs at his PCPs office showing sodium 143).  He follows up outpatient with Dr. VMickeal Skinnerfor history of seizure and saw him recently.  He is expected to follow-up with him on 2/29 for telephone call.  Patient completed his Decadron taper.  He is currently on Keppra.   The patient's main issue today is related to fatigue.  The patient's wife states that he sleeps approximately 17 hours/day.  His wife also states that he is sometimes not very "clearheaded".  He will sleep get up for an hour, go back to bed for approximately 4 hours.  He also lost close to 20 pounds since 02/10/2022 where he weighed 191 pounds and today he weighs 174 pounds.  He reports that he has not been as hungry.  He feels fatigued.  He denies any fever, chills, or night sweats.  He reports that his breathing is "pretty good".  He initially stated that he felt like his breathing was better but then later stated that it may be getting worse.  Denies any cough, chest pain, or hemoptysis.  Denies any nausea, vomiting, diarrhea, or constipation.  He sometimes may have a mild headache but nothing significant.  He denies any rashes or skin changes.  The patient recently had a restaging CT scan performed.  He is here today for evaluation and to review his scan results.  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 ICD10 Conversion    ALLERGIES:  is allergic to albuterol sulfate and  tamsulosin.  MEDICATIONS:  Current Outpatient Medications  Medication Sig Dispense Refill   aspirin 81 MG EC tablet Take 81 mg by mouth in the morning.     Bacillus Coagulans-Inulin (PROBIOTIC) 1-250 BILLION-MG CAPS Take 1 capsule by mouth in the morning.     bisacodyl (DULCOLAX) 5 MG EC tablet Take 5 mg by mouth daily as needed for moderate constipation.     Cholecalciferol (D3 PO) Take 1 capsule by mouth in the morning.     coal tar-salicylic acid 2 % shampoo Apply 1 application  topically every 7 (seven) days.     dexamethasone (DECADRON) 1 MG tablet Take 1 tablet (1 mg total) by mouth daily with breakfast. 7 tablet 0   docusate sodium (COLACE) 100 MG capsule Take 100 mg by mouth 3 (three) times daily as needed for mild constipation.     DULoxetine (CYMBALTA) 30 MG capsule Take 30 mg by mouth in the morning.     fexofenadine (ALLEGRA) 180 MG tablet Take 180 mg by mouth daily as needed for allergies.     fluocinonide cream (LIDEX) [id number] % Apply 1 application  topically 2 (two) times daily as needed (for psoriasis).     folic acid (FOLVITE) 1 MG tablet TAKE 1 TABLET(1 MG) BY MOUTH DAILY (Patient taking differently: Take 1 mg by mouth in the morning.) 30 tablet 4   furosemide (LASIX) 20 MG tablet Take 0.5 tablets (10 mg total) by mouth daily. 30 tablet 2   hydrocortisone cream 1 % Apply 1 application topically 2 (two) times daily. (Patient taking differently: Apply 1 application  topically 2 (two) times daily as needed (for rashes).) 453.6 g 0   ketoconazole (NIZORAL) 2 % cream Apply 1 application  topically daily as needed (rash).     Krill Oil (OMEGA-3) 500 MG CAPS Take 500 mg by mouth in the morning.     levETIRAcetam (KEPPRA) 500 MG tablet Take 1 tablet (500 mg total) by mouth 2 (two) times daily. 60 tablet 3   levothyroxine (SYNTHROID) 25 MCG tablet TAKE 1 TABLET(25 MCG) BY MOUTH DAILY BEFORE BREAKFAST (Patient taking differently: Take 25 mcg by mouth daily before breakfast.) 30 tablet 2    lidocaine-prilocaine (EMLA) cream Apply 1 application topically as needed. (Patient taking differently: Apply 1 application  topically daily as needed (access port).) 30 g 1   Magnesium 500 MG TABS Take 500 mg by mouth at bedtime.     Melatonin 12 MG TABS Take 12 mg by mouth at bedtime.     omeprazole (PRILOSEC) 40 MG capsule Take 40 mg by mouth in the morning.     oxyCODONE-acetaminophen (PERCOCET/ROXICET) 5-325 MG tablet Take 1 tablet by mouth every 8 (eight) hours as needed for severe pain. 30 tablet 0   polyethylene glycol (MIRALAX / GLYCOLAX) 17 g packet Take 17 g by mouth 2 (two) times daily. 14 each 0   prochlorperazine (COMPAZINE) 10 MG tablet TAKE 1 TABLET(10 MG) BY MOUTH EVERY 6 HOURS AS NEEDED FOR NAUSEA OR VOMITING  30 tablet 0   senna-docusate (SENOKOT-S) 8.6-50 MG tablet Take 1 tablet by mouth 2 (two) times daily. 30 tablet 0   sodium chloride 1 g tablet Take 2 tablets (2 g total) by mouth 3 (three) times daily with meals. 90 tablet 1   No current facility-administered medications for this visit.   Facility-Administered Medications Ordered in Other Visits  Medication Dose Route Frequency Provider Last Rate Last Admin   heparin lock flush 100 unit/mL  500 Units Intravenous Once Giorgi Debruin L, PA-C       sodium chloride flush (NS) 0.9 % injection 10 mL  10 mL Intravenous PRN Allegra Cerniglia L, PA-C        SURGICAL HISTORY:  Past Surgical History:  Procedure Laterality Date   IR IMAGING GUIDED PORT INSERTION  11/24/2020   KNEE SURGERY     orthoscopic     TONSILLECTOMY AND ADENOIDECTOMY      REVIEW OF SYSTEMS:   Review of Systems  Constitutional: Positive for fatigue, generalized weakness, appetite change, weight loss. Negative for chills and fever.  HENT: Negative for mouth sores, nosebleeds, sore throat and trouble swallowing.   Eyes: Negative for eye problems and icterus.  Respiratory: No for dyspnea on exertion.  Negative for cough, hemoptysis,  and  wheezing.   Cardiovascular: Negative for chest pain and leg swelling.  Gastrointestinal: Negative for abdominal pain, constipation, diarrhea, nausea and vomiting.  Genitourinary: Negative for bladder incontinence, difficulty urinating, dysuria, frequency and hematuria.   Musculoskeletal: Negative for back pain, gait problem, neck pain and neck stiffness.  Skin: Negative for itching and rash.  Neurological: Negative for dizziness, extremity weakness, gait problem, headaches, light-headedness and seizures.  Hematological: Negative for adenopathy. Does not bruise/bleed easily.  Psychiatric/Behavioral: Positive for occasional foggy mentation memory impairment.  Negative for confusion, depression and sleep disturbance. The patient is not nervous/anxious.     PHYSICAL EXAMINATION:  Blood pressure 110/78, pulse (!) 105, weight 174 lb 3.2 oz (79 kg), SpO2 95 %.  ECOG PERFORMANCE STATUS: 3  Physical Exam  Constitutional: Oriented to person, place, and time and elderly appearing male and in no distress. HENT:  Head: Normocephalic and atraumatic.  Mouth/Throat: Oropharynx is clear and moist. No oropharyngeal exudate.  Eyes: Conjunctivae are normal. Right eye exhibits no discharge. Left eye exhibits no discharge. No scleral icterus.  Neck: Normal range of motion. Neck supple.  Cardiovascular: Normal rate, regular rhythm, normal heart sounds and intact distal pulses.   Pulmonary/Chest: Effort normal and breath sounds normal. No respiratory distress. No wheezes. No rales.  Abdominal: Soft. Bowel sounds are normal. Exhibits no distension and no mass. There is no tenderness.  Musculoskeletal: Normal range of motion. Exhibits no edema.  Lymphadenopathy:    No cervical adenopathy.  Neurological: Alert and oriented to person, place, and time. Exhibits normal muscle tone. Gait normal. Coordination normal.  Skin: Skin is warm and dry. No rash noted. Not diaphoretic. No erythema. No pallor.  Psychiatric:  Mood, memory and judgment normal.  Vitals reviewed.  LABORATORY DATA: Lab Results  Component Value Date   WBC 5.1 03/03/2022   HGB 11.3 (L) 03/03/2022   HCT 34.4 (L) 03/03/2022   MCV 94.2 03/03/2022   PLT 179 03/03/2022      Chemistry      Component Value Date/Time   NA 141 03/03/2022 1019   K 3.9 03/03/2022 1019   CL 108 03/03/2022 1019   CO2 26 03/03/2022 1019   BUN 18 03/03/2022 1019   CREATININE 1.01  03/03/2022 1019      Component Value Date/Time   CALCIUM 8.9 03/03/2022 1019   ALKPHOS 95 03/03/2022 1019   AST 21 03/03/2022 1019   ALT 9 03/03/2022 1019   BILITOT 0.6 03/03/2022 1019       RADIOGRAPHIC STUDIES:  CT Chest W Contrast  Result Date: 03/02/2022 CLINICAL DATA:  Staging of non-small cell lung cancer in an 82 year old male. Patient with history of LEFT upper lobe nodule post treatment. Palliative radiation to the LEFT upper lobe and with history of CNS metastases. * Tracking Code: BO * EXAM: CT CHEST, ABDOMEN, AND PELVIS WITH CONTRAST TECHNIQUE: Multidetector CT imaging of the chest, abdomen and pelvis was performed following the standard protocol during bolus administration of intravenous contrast. RADIATION DOSE REDUCTION: This exam was performed according to the departmental dose-optimization program which includes automated exposure control, adjustment of the mA and/or kV according to patient size and/or use of iterative reconstruction technique. CONTRAST:  173m OMNIPAQUE IOHEXOL 300 MG/ML  SOLN COMPARISON:  December 25, 2021 FINDINGS: CT CHEST FINDINGS Cardiovascular: Calcified aortic atherosclerotic changes. Calcified coronary artery disease as on prior imaging. No aortic dilation. RIGHT-sided Port-A-Cath in situ terminating at the caval to atrial junction. Pericardial nodularity along the LEFT heart border has developed since previous imaging, see below mediastinal section. Central pulmonary vasculature is unremarkable on venous phase. Mediastinum/Nodes:  Enlargement of low-density AP window and prevascular lymph nodes since previous imaging. (Image 25/2) 18 mm lymph node just anterior to LEFT pulmonary artery previously measured approximately 9 mm. 12 mm lymph node adjacent to main pulmonary artery previously less than a cm. AP window lymph node 13 mm previously less than a cm. Rounded lymph nodes tracking along the under surface of the LEFT mainstem bronchus and anterior to the esophagus new from previous imaging 12 mm (image 31/2) no supraclavicular or axillary lymphadenopathy. Also with mild enlargement of subcarinal nodal tissue on image 28/213 mm. These enlarged lymph nodes about the AP window and LEFT hilum are contiguous with nodular pleural changes and nodular pericardial changes, for instance on image 32/2 an area measuring 2.7 x 1.8 cm. Convex margins along central hilar consolidative changes likely a mixture of post treatment changes and disease. (Image 31/2) Lungs/Pleura: Interval development of a LEFT-sided pleural fluid collection. No frank nodularity in that LEFT-sided pleural fluid. Pleural effusion is moderate volume measuring 3.9 cm in the dependent LEFT chest. Area of soft tissue at the LEFT hilum contiguous with central LEFT hilar and AP window lymph nodes again suspicious for disease amidst post treatment changes extending into or from the mediastinum. Post radiation changes in the LEFT chest. Airways remain patent. Subpleural reticulation and signs of interstitial lung disease similar to prior imaging. Small RIGHT middle lobe pulmonary nodule on image 81/4 measuring 4 mm without change. Musculoskeletal: No chest wall mass. See below for full musculoskeletal details. CT ABDOMEN PELVIS FINDINGS Hepatobiliary: Mildly lobular hepatic contours and mild fissural widening, nonspecific and unchanged. New hypodense area in the RIGHT hemiliver on image 51/2 7 mm. This is at the periphery of the RIGHT hepatic lobe. No additional definitive signs of new  hepatic lesion. The portal vein is patent. No pericholecystic stranding. No biliary duct dilation. Pancreas: Normal, without mass, inflammation or ductal dilatation. Spleen: Normal. Adrenals/Urinary Tract: Adrenal glands are unremarkable. Symmetric renal enhancement. No sign of hydronephrosis. No suspicious renal lesion or perinephric stranding. Bosniak category I cyst for which no additional dedicated follow-up imaging is recommended arising from the anterior hilar lip of the  LEFT kidney measuring 2.3 cm greatest dimension. Urinary bladder is grossly unremarkable but is under distended limiting assessment. Stomach/Bowel: No acute gastric process. No acute small bowel process. Signs of colonic diverticulosis. Normal appendix. Vascular/Lymphatic: Aortic atherosclerosis. No sign of aneurysm. Smooth contour of the IVC. There is no gastrohepatic or hepatoduodenal ligament lymphadenopathy. No retroperitoneal or mesenteric lymphadenopathy. No pelvic sidewall lymphadenopathy. Atherosclerotic changes are moderate. Reproductive: Unremarkable to the extent evaluated. Other: No ascites. Musculoskeletal: Spinal degenerative changes without acute or destructive bone process. Grade 2 anterolisthesis of L5 on S1 in the setting of pars defects similar to prior imaging. IMPRESSION: 1. Nodal disease and infiltrative changes about the LEFT hilum in AP window compatible with disease recurrence/worsening. 2. Nodal enlargement in other areas of mediastinum as discussed. 3. Post treatment changes blending with worsening disease about the LEFT hilum. Also with signs of pericardial involvement in infiltrative changes extending into the LEFT mediastinum. 4. Findings suspicious for new small focus of metastatic disease in the RIGHT hepatic lobe. 5. Small RIGHT middle lobe pulmonary nodule without change, attention on follow-up. 6. Background interstitial lung disease, aortic atherosclerosis and calcified coronary artery disease. 7. Colonic  diverticulosis. Aortic Atherosclerosis (ICD10-I70.0). Electronically Signed   By: Zetta Bills M.D.   On: 03/02/2022 11:23   CT Abdomen Pelvis W Contrast  Result Date: 03/02/2022 CLINICAL DATA:  Staging of non-small cell lung cancer in an 82 year old male. Patient with history of LEFT upper lobe nodule post treatment. Palliative radiation to the LEFT upper lobe and with history of CNS metastases. * Tracking Code: BO * EXAM: CT CHEST, ABDOMEN, AND PELVIS WITH CONTRAST TECHNIQUE: Multidetector CT imaging of the chest, abdomen and pelvis was performed following the standard protocol during bolus administration of intravenous contrast. RADIATION DOSE REDUCTION: This exam was performed according to the departmental dose-optimization program which includes automated exposure control, adjustment of the mA and/or kV according to patient size and/or use of iterative reconstruction technique. CONTRAST:  169m OMNIPAQUE IOHEXOL 300 MG/ML  SOLN COMPARISON:  December 25, 2021 FINDINGS: CT CHEST FINDINGS Cardiovascular: Calcified aortic atherosclerotic changes. Calcified coronary artery disease as on prior imaging. No aortic dilation. RIGHT-sided Port-A-Cath in situ terminating at the caval to atrial junction. Pericardial nodularity along the LEFT heart border has developed since previous imaging, see below mediastinal section. Central pulmonary vasculature is unremarkable on venous phase. Mediastinum/Nodes: Enlargement of low-density AP window and prevascular lymph nodes since previous imaging. (Image 25/2) 18 mm lymph node just anterior to LEFT pulmonary artery previously measured approximately 9 mm. 12 mm lymph node adjacent to main pulmonary artery previously less than a cm. AP window lymph node 13 mm previously less than a cm. Rounded lymph nodes tracking along the under surface of the LEFT mainstem bronchus and anterior to the esophagus new from previous imaging 12 mm (image 31/2) no supraclavicular or axillary  lymphadenopathy. Also with mild enlargement of subcarinal nodal tissue on image 28/213 mm. These enlarged lymph nodes about the AP window and LEFT hilum are contiguous with nodular pleural changes and nodular pericardial changes, for instance on image 32/2 an area measuring 2.7 x 1.8 cm. Convex margins along central hilar consolidative changes likely a mixture of post treatment changes and disease. (Image 31/2) Lungs/Pleura: Interval development of a LEFT-sided pleural fluid collection. No frank nodularity in that LEFT-sided pleural fluid. Pleural effusion is moderate volume measuring 3.9 cm in the dependent LEFT chest. Area of soft tissue at the LEFT hilum contiguous with central LEFT hilar and AP window lymph nodes  again suspicious for disease amidst post treatment changes extending into or from the mediastinum. Post radiation changes in the LEFT chest. Airways remain patent. Subpleural reticulation and signs of interstitial lung disease similar to prior imaging. Small RIGHT middle lobe pulmonary nodule on image 81/4 measuring 4 mm without change. Musculoskeletal: No chest wall mass. See below for full musculoskeletal details. CT ABDOMEN PELVIS FINDINGS Hepatobiliary: Mildly lobular hepatic contours and mild fissural widening, nonspecific and unchanged. New hypodense area in the RIGHT hemiliver on image 51/2 7 mm. This is at the periphery of the RIGHT hepatic lobe. No additional definitive signs of new hepatic lesion. The portal vein is patent. No pericholecystic stranding. No biliary duct dilation. Pancreas: Normal, without mass, inflammation or ductal dilatation. Spleen: Normal. Adrenals/Urinary Tract: Adrenal glands are unremarkable. Symmetric renal enhancement. No sign of hydronephrosis. No suspicious renal lesion or perinephric stranding. Bosniak category I cyst for which no additional dedicated follow-up imaging is recommended arising from the anterior hilar lip of the LEFT kidney measuring 2.3 cm greatest  dimension. Urinary bladder is grossly unremarkable but is under distended limiting assessment. Stomach/Bowel: No acute gastric process. No acute small bowel process. Signs of colonic diverticulosis. Normal appendix. Vascular/Lymphatic: Aortic atherosclerosis. No sign of aneurysm. Smooth contour of the IVC. There is no gastrohepatic or hepatoduodenal ligament lymphadenopathy. No retroperitoneal or mesenteric lymphadenopathy. No pelvic sidewall lymphadenopathy. Atherosclerotic changes are moderate. Reproductive: Unremarkable to the extent evaluated. Other: No ascites. Musculoskeletal: Spinal degenerative changes without acute or destructive bone process. Grade 2 anterolisthesis of L5 on S1 in the setting of pars defects similar to prior imaging. IMPRESSION: 1. Nodal disease and infiltrative changes about the LEFT hilum in AP window compatible with disease recurrence/worsening. 2. Nodal enlargement in other areas of mediastinum as discussed. 3. Post treatment changes blending with worsening disease about the LEFT hilum. Also with signs of pericardial involvement in infiltrative changes extending into the LEFT mediastinum. 4. Findings suspicious for new small focus of metastatic disease in the RIGHT hepatic lobe. 5. Small RIGHT middle lobe pulmonary nodule without change, attention on follow-up. 6. Background interstitial lung disease, aortic atherosclerosis and calcified coronary artery disease. 7. Colonic diverticulosis. Aortic Atherosclerosis (ICD10-I70.0). Electronically Signed   By: Zetta Bills M.D.   On: 03/02/2022 11:23   MR Brain W and Wo Contrast  Result Date: 02/10/2022 CLINICAL DATA:  Metastatic disease.  Assess treatment response. EXAM: MRI HEAD WITHOUT AND WITH CONTRAST TECHNIQUE: Multiplanar, multiecho pulse sequences of the brain and surrounding structures were obtained without and with intravenous contrast. CONTRAST:  43m GADAVIST GADOBUTROL 1 MMOL/ML IV SOLN COMPARISON:  CT same day.  MRI  12/03/2021. 07/29/2021. FINDINGS: Brain: Diffusion imaging does not show any acute or subacute infarction or other cause of restricted diffusion. Posterior fossa appears normal. No residual finding related to the treated vermian metastatic lesion. Right cerebral hemisphere appears normal. No residual finding relating to the previously treated medial right occipital metastasis. Treated left temporal lesion continues to contract, axial diameter 17 x 18 mm compared with 22 x 25 mm in November. No new or progressive finding. Reduction in T2 FLAIR signal within the left temporal lobe. No new lesion seen in the left hemisphere. No hydrocephalus.  No extra-axial collection. Vascular: Major vessels at the base of the brain show flow. Skull and upper cervical spine: Negative Sinuses/Orbits: Clear/normal Other: None IMPRESSION: 1. No evidence of new or progressive metastatic disease. 2. Treated left temporal lesion continues to contract, axial diameter 17 x 18 mm compared with 22 x  25 mm in November. Reduction in T2 FLAIR signal within the left temporal lobe. 3. No residual finding related to the treated right occipital metastasis. 4. No residual finding related to the treated vermian metastasis. Electronically Signed   By: Nelson Chimes M.D.   On: 02/10/2022 15:11   CT Head Wo Contrast  Result Date: 02/10/2022 CLINICAL DATA:  Sinusitis. Acute. Orbital or intracranial complication suspected. Seizure. History of lung cancer. EXAM: CT HEAD WITHOUT CONTRAST TECHNIQUE: Contiguous axial images were obtained from the base of the skull through the vertex without intravenous contrast. RADIATION DOSE REDUCTION: This exam was performed according to the departmental dose-optimization program which includes automated exposure control, adjustment of the mA and/or kV according to patient size and/or use of iterative reconstruction technique. COMPARISON:  05/01/2021.  12/04/2021 MRI. FINDINGS: Brain: No CT abnormality visible in the  brainstem or cerebellum. Indistinct low-density in the left anterior temporal lobe relates to a treated lesion in that region. There appears to be less mass effect and edema than was seen on recent prior imaging, but this could certainly be a seizure focus. Cerebral hemispheres otherwise show atrophy and chronic small-vessel ischemic change. Other small treated lesions are not visible. No hydrocephalus or extra-axial collection. MRI of the brain with contrast would be appropriate if detailed evaluation of the metastatic disease is desired. Vascular: There is atherosclerotic calcification of the major vessels at the base of the brain. Skull: Negative Sinuses/Orbits: Sinuses are clear.  Orbits are normal. Other: Mastoids are clear. IMPRESSION: Indistinct low-density in the left anterior temporal lobe relates to a treated metastatic lesion in that region. There appears to be less mass effect and edema than was seen on recent prior imaging, but this could certainly be a seizure focus. None of the other previously seen treated lesions are discernible by CT. MRI of the brain with contrast would be appropriate if detailed evaluation of the metastatic disease is desired. Electronically Signed   By: Nelson Chimes M.D.   On: 02/10/2022 13:08     ASSESSMENT/PLAN:  This is a very pleasant 82 year old Caucasian male diagnosed with stage IV (T1b, N2, M1A) non-small cell lung cancer, adenocarcinoma.  The patient presented with a left upper lobe lung nodule in addition to mediastinal lymphadenopathy and pleural-based metastasis as well as a malignant left pleural effusion.  The patient was diagnosed in July 2022.  The patient also had a metastatic brain lesion.   The patient completed SRS to the brain lesion on 09/03/2020 under the care of Dr. [name].  The patient is currently undergoing systemic chemotherapy with carboplatin for an AUC of 5, Alimta 500 mg per metered squared, Keytruda 200 mg IV every 3 weeks.  The patient is  status post 24 cycles.  Starting from cycle #5, the patient started maintenance Alimta and Keytruda.   On the patient's restaging CT scan from 04/23/2021, the patient had an enlarging left upper lobe nodule.  The patient completed SBRT to this lesion in [place].  This was completed in June 2023.   The patient was seen with Dr. Julien Nordmann today.  The patient recently had a restaging CT scan performed.  Dr. Julien Nordmann personally and independently reviewed the scan and discussed the results with the patient today.  The scan showed some increased nodal disease and infiltrative changes in the left hilum and signs of pericardial involvement.  There is also suspicious findings for a new small focus of metastatic disease in the hepatic lobe.    Dr. Julien Nordmann had a  lengthy discussion with the patient and his wife about his current condition and recommended treatment options.  Dr. Julien Nordmann discussed given his poor performance status that hospice and palliative care is reasonable.  However if the patient wants to try, we can discuss alternative chemotherapy options, although they would likely be challenging for the patient.  The patient ultimately decided that he does not want to pursue intensive chemotherapy.  Therefore, he had opting for referral to palliative care and hospice.  I have placed a referral.  I will cancel his appointments but we would be happy to see him on an as-needed basis.  He had an appointment with Dr. Mickeal Skinner scheduled for tomorrow via telephone call.  I will update Dr. Mickeal Skinner about our discussion at his appointment today.  His sodium is 140.  Discussed with patient likely does not need to take salt tablets 3 times daily anymore.  Dr. Julien Nordmann recommended to take this 1-2 times per day.  The patient was advised to call immediately if he has any concerning symptoms in the interval. The patient voices understanding of current disease status and treatment options and is in agreement with the  current care plan. All questions were answered. The patient knows to call the clinic with any problems, questions or concerns. We can certainly see the patient much sooner if necessary    Orders Placed This Encounter  Procedures   Ambulatory referral to Hospice    Referral Priority:   Routine    Referral Type:   Consultation    Referral Reason:   Specialty Services Required    Requested Specialty:   Hospice Services    Number of Visits Requested:   1     I spent {CHL ONC TIME VISIT - [id number] counseling the patient face to face. The total time spent in the appointment was {CHL ONC TIME VISIT - [id number].  Jamyson Jirak L Leilanny Fluitt, PA-C 03/03/22

## 2022-03-03 ENCOUNTER — Inpatient Hospital Stay: Payer: Medicare HMO

## 2022-03-03 ENCOUNTER — Encounter: Payer: Self-pay | Admitting: Internal Medicine

## 2022-03-03 ENCOUNTER — Inpatient Hospital Stay (HOSPITAL_BASED_OUTPATIENT_CLINIC_OR_DEPARTMENT_OTHER): Payer: Medicare HMO | Admitting: Physician Assistant

## 2022-03-03 ENCOUNTER — Other Ambulatory Visit: Payer: Self-pay

## 2022-03-03 VITALS — BP 110/78 | HR 105 | Wt 174.2 lb

## 2022-03-03 DIAGNOSIS — Z7189 Other specified counseling: Secondary | ICD-10-CM | POA: Diagnosis not present

## 2022-03-03 DIAGNOSIS — C3492 Malignant neoplasm of unspecified part of left bronchus or lung: Secondary | ICD-10-CM

## 2022-03-03 DIAGNOSIS — Z95828 Presence of other vascular implants and grafts: Secondary | ICD-10-CM

## 2022-03-03 DIAGNOSIS — C7931 Secondary malignant neoplasm of brain: Secondary | ICD-10-CM | POA: Diagnosis not present

## 2022-03-03 LAB — CMP (CANCER CENTER ONLY)
ALT: 9 U/L (ref 0–44)
AST: 21 U/L (ref 15–41)
Albumin: 3.7 g/dL (ref 3.5–5.0)
Alkaline Phosphatase: 95 U/L (ref 38–126)
Anion gap: 7 (ref 5–15)
BUN: 18 mg/dL (ref 8–23)
CO2: 26 mmol/L (ref 22–32)
Calcium: 8.9 mg/dL (ref 8.9–10.3)
Chloride: 108 mmol/L (ref 98–111)
Creatinine: 1.01 mg/dL (ref 0.61–1.24)
GFR, Estimated: >60 mL/min (ref >60)
Glucose, Bld: 110 mg/dL — ABNORMAL HIGH (ref 70–99)
Potassium: 3.9 mmol/L (ref 3.5–5.1)
Sodium: 141 mmol/L (ref 135–145)
Total Bilirubin: 0.6 mg/dL (ref 0.3–1.2)
Total Protein: 5.9 g/dL — ABNORMAL LOW (ref 6.5–8.1)

## 2022-03-03 LAB — CBC WITH DIFFERENTIAL (CANCER CENTER ONLY)
Abs Immature Granulocytes: 0.02 10*3/uL (ref 0.00–0.07)
Basophils Absolute: 0 10*3/uL (ref 0.0–0.1)
Basophils Relative: 1 %
Eosinophils Absolute: 0.3 10*3/uL (ref 0.0–0.5)
Eosinophils Relative: 6 %
HCT: 34.4 % — ABNORMAL LOW (ref 39.0–52.0)
Hemoglobin: 11.3 g/dL — ABNORMAL LOW (ref 13.0–17.0)
Immature Granulocytes: 0 %
Lymphocytes Relative: 35 %
Lymphs Abs: 1.8 10*3/uL (ref 0.7–4.0)
MCH: 31 pg (ref 26.0–34.0)
MCHC: 32.8 g/dL (ref 30.0–36.0)
MCV: 94.2 fL (ref 80.0–100.0)
Monocytes Absolute: 0.7 10*3/uL (ref 0.1–1.0)
Monocytes Relative: 13 %
Neutro Abs: 2.3 10*3/uL (ref 1.7–7.7)
Neutrophils Relative %: 45 %
Platelet Count: 179 10*3/uL (ref 150–400)
RBC: 3.65 MIL/uL — ABNORMAL LOW (ref 4.22–5.81)
RDW: 14.9 % (ref 11.5–15.5)
WBC Count: 5.1 10*3/uL (ref 4.0–10.5)
nRBC: 0 % (ref 0.0–0.2)

## 2022-03-03 MED ORDER — SODIUM CHLORIDE 0.9% FLUSH
10.0000 mL | Freq: Once | INTRAVENOUS | Status: AC
  Administered 2022-03-03: 10 mL

## 2022-03-03 MED ORDER — HEPARIN SOD (PORK) LOCK FLUSH 100 UNIT/ML IV SOLN: 500.0000 [IU] | Freq: Once | INTRAVENOUS | Status: DC

## 2022-03-03 MED ORDER — SODIUM CHLORIDE 0.9% FLUSH: 10.0000 mL | INTRAVENOUS | Status: DC | PRN

## 2022-03-03 NOTE — Progress Notes (Signed)
Patient seen by PA today  Vitals are within treatment parameters.  Labs reviewed: not within treatment parameters  Per physician team, patient will not be receiving treatment today.   Infusion team made aware.  Patient's port flushed with NS, locked with Heparin and de-accessed.  Patient tolerated well and stable at discharge.

## 2022-03-03 NOTE — Patient Instructions (Signed)
-  We covered a lot of important information at your appointment today regarding what the treatment plan is moving forward. Here are the the main points that were discussed at your office visit with [name] today:  -The treatment that you will receive consists of two chemotherapy drugs, called Docetaxel and Cyramza (Ramucirumab) -We are planning on starting your treatment next week on _/_/_  -Your treatment will be given once every 3 weeks. We will check your labs once a week just to make sure that important components of your blood are in an acceptable range -You will receive an injection about 3 days after chemotherapy. The purpose of this injection is to boost your bodies infection fighting cells to protect you from getting an infection.  -We will get a CT scan after 3 treatments to check on the progress of treatment  Medications:  -I have sent a few important medication prescriptions to your pharmacy.  -Compazine was sent to your pharmacy. This medication is for nausea. You may take this every 6 hours as needed if you feel nausous.  -I have sent a prescription for Decadron (Dexamethasone) to your pharmacy. This medication is a steroids. The purpose of this medication is because it is one if the pre-medications that is needed before you get treatment. You will need to take Two Tablets Twice a day (for a total of 4 tablets a day) the day before, the day of, and the day after chemotherapy. This means that you will take 12 tablets total over those 3 consecutive days.   Molecular Studies   Imaging Studies:   Referrals:   Side effects: The side effects of treatment may include but are not limited to alopecia (losing your hair), myelosuppression (dropping your blood counts), nausea and vomiting, peripheral neuropathy (numbness in the hands and feet), liver or renal dysfunction as well as the adverse effect of Cyramza including increased risk for hemorrhage (bleeding) and GI perforation. Please seek medical  attention if you develop concerning or significant bleeding.   Follow up:  -Please call us with your decision either way so we can help you

## 2022-03-04 ENCOUNTER — Inpatient Hospital Stay: Payer: Medicare HMO | Admitting: Internal Medicine

## 2022-03-04 ENCOUNTER — Other Ambulatory Visit: Payer: Self-pay | Admitting: Physician Assistant

## 2022-03-04 ENCOUNTER — Encounter: Payer: Self-pay | Admitting: Internal Medicine

## 2022-03-04 ENCOUNTER — Inpatient Hospital Stay (HOSPITAL_BASED_OUTPATIENT_CLINIC_OR_DEPARTMENT_OTHER): Payer: Medicare HMO | Admitting: Internal Medicine

## 2022-03-04 DIAGNOSIS — C7931 Secondary malignant neoplasm of brain: Secondary | ICD-10-CM | POA: Diagnosis not present

## 2022-03-04 NOTE — Progress Notes (Signed)
Hospice Referral  submitted to [facility] in [place], [place] for this patient.  Faxed to [phone].  Per patient family request.  No further concerns noted at this time.

## 2022-03-04 NOTE — Progress Notes (Signed)
I connected with Peter Brown on 03/04/22 at  3:00 PM EST by telephone visit and verified that I am speaking with the correct person using two identifiers.  I discussed the limitations, risks, security and privacy concerns of performing an evaluation and management service by telemedicine and the availability of in-person appointments. I also discussed with the patient that there may be a patient responsible charge related to this service. The patient expressed understanding and agreed to proceed.  Other persons participating in the visit and their role in the encounter:  spouse   Patient's location:  Home Provider's location:  Office Chief Complaint:  Malignant neoplasm metastatic to brain [facility])  History of Present Ilness: Peter Brown reports no clinical changes today.  He has transitioned to hospice following recommendations from the medical oncology team.  He remains very fatigued, with little waking time.  This has been more marked since Keppra was started.  Observations: Language and cognition at baseline  Assessment and Plan: Malignant neoplasm metastatic to brain [facility])  We are supportive of Peter Brown transition to comfort based care.   Agreeable with discontinuing Keppra, given poor tolerance; seizure was provoked by hyponatremia, he is currently on salt tabs.  Follow Up Instructions: RTC as needed, no MRI  I discussed the assessment and treatment plan with the patient.  The patient was provided an opportunity to ask questions and all were answered.  The patient agreed with the plan and demonstrated understanding of the instructions.    The patient was advised to call back or seek an in-person evaluation if the symptoms worsen or if the condition fails to improve as anticipated.    Ventura Sellers, MD   I provided 22 minutes of non face-to-face telephone visit time during this encounter, and > 50% was spent counseling as documented under my assessment & plan.

## 2022-03-11 ENCOUNTER — Inpatient Hospital Stay: Admission: RE | Admit: 2022-03-11 | Payer: Medicare HMO | Source: Ambulatory Visit

## 2022-03-15 ENCOUNTER — Ambulatory Visit: Payer: Medicare HMO | Admitting: Internal Medicine

## 2022-03-24 ENCOUNTER — Ambulatory Visit: Payer: Medicare HMO | Admitting: Physician Assistant

## 2022-03-24 ENCOUNTER — Ambulatory Visit: Payer: Medicare HMO

## 2022-03-24 ENCOUNTER — Other Ambulatory Visit: Payer: Medicare HMO

## 2022-04-06 ENCOUNTER — Other Ambulatory Visit: Payer: Self-pay

## 2022-04-11 ENCOUNTER — Other Ambulatory Visit: Payer: Self-pay | Admitting: Physician Assistant

## 2022-04-11 DIAGNOSIS — E039 Hypothyroidism, unspecified: Secondary | ICD-10-CM

## 2022-04-14 ENCOUNTER — Ambulatory Visit: Payer: Medicare HMO | Admitting: Internal Medicine

## 2022-04-14 ENCOUNTER — Other Ambulatory Visit: Payer: Medicare HMO

## 2022-04-14 ENCOUNTER — Ambulatory Visit: Payer: Medicare HMO

## 2022-04-14 ENCOUNTER — Ambulatory Visit: Payer: Medicare HMO | Admitting: Physician Assistant

## 2022-04-15 ENCOUNTER — Ambulatory Visit: Payer: Medicare HMO

## 2022-04-15 ENCOUNTER — Ambulatory Visit: Payer: Medicare HMO | Admitting: Internal Medicine

## 2022-04-15 ENCOUNTER — Other Ambulatory Visit: Payer: Medicare HMO

## 2022-04-20 ENCOUNTER — Encounter: Payer: Self-pay | Admitting: Internal Medicine

## 2022-05-05 DEATH — deceased

## 2022-05-13 ENCOUNTER — Other Ambulatory Visit: Payer: Medicare HMO

## 2022-05-26 ENCOUNTER — Ambulatory Visit: Payer: Medicare HMO | Admitting: Physician Assistant

## 2022-05-26 ENCOUNTER — Ambulatory Visit: Payer: Medicare HMO

## 2022-05-26 ENCOUNTER — Other Ambulatory Visit: Payer: Medicare HMO

## 2022-11-24 NOTE — Telephone Encounter (Signed)
Telephone call  

## 2023-09-30 ENCOUNTER — Other Ambulatory Visit: Payer: Self-pay
